# Patient Record
Sex: Female | Born: 1940 | Race: White | Hispanic: No | Smoking: Former smoker
Health system: Southern US, Community
[De-identification: ages and names within clinical notes are randomized; demographics above are authoritative.]

## PROBLEM LIST (undated history)

## (undated) DIAGNOSIS — I499 Cardiac arrhythmia, unspecified: Secondary | ICD-10-CM

## (undated) DIAGNOSIS — J189 Pneumonia, unspecified organism: Secondary | ICD-10-CM

## (undated) DIAGNOSIS — I4819 Other persistent atrial fibrillation: Secondary | ICD-10-CM

## (undated) DIAGNOSIS — J069 Acute upper respiratory infection, unspecified: Secondary | ICD-10-CM

## (undated) DIAGNOSIS — E785 Hyperlipidemia, unspecified: Secondary | ICD-10-CM

## (undated) DIAGNOSIS — T783XXA Angioneurotic edema, initial encounter: Secondary | ICD-10-CM

## (undated) DIAGNOSIS — E079 Disorder of thyroid, unspecified: Secondary | ICD-10-CM

## (undated) DIAGNOSIS — T7840XA Allergy, unspecified, initial encounter: Secondary | ICD-10-CM

## (undated) DIAGNOSIS — I10 Essential (primary) hypertension: Secondary | ICD-10-CM

## (undated) DIAGNOSIS — E669 Obesity, unspecified: Secondary | ICD-10-CM

## (undated) DIAGNOSIS — E039 Hypothyroidism, unspecified: Secondary | ICD-10-CM

## (undated) DIAGNOSIS — IMO0002 Reserved for concepts with insufficient information to code with codable children: Secondary | ICD-10-CM

## (undated) DIAGNOSIS — F419 Anxiety disorder, unspecified: Secondary | ICD-10-CM

## (undated) HISTORY — DX: Reserved for concepts with insufficient information to code with codable children: IMO0002

## (undated) HISTORY — PX: ADENOIDECTOMY: SUR15

## (undated) HISTORY — DX: Allergy, unspecified, initial encounter: T78.40XA

## (undated) HISTORY — DX: Angioneurotic edema, initial encounter: T78.3XXA

## (undated) HISTORY — DX: Disorder of thyroid, unspecified: E07.9

## (undated) HISTORY — DX: Anxiety disorder, unspecified: F41.9

## (undated) HISTORY — DX: Essential (primary) hypertension: I10

## (undated) HISTORY — DX: Cardiac arrhythmia, unspecified: I49.9

## (undated) HISTORY — DX: Obesity, unspecified: E66.9

## (undated) HISTORY — PX: TONSILLECTOMY: SUR1361

## (undated) HISTORY — PX: TOTAL ABDOMINAL HYSTERECTOMY: SHX209

## (undated) HISTORY — DX: Acute upper respiratory infection, unspecified: J06.9

## (undated) HISTORY — DX: Hyperlipidemia, unspecified: E78.5

---

## 2001-06-01 ENCOUNTER — Encounter: Payer: Self-pay | Admitting: Internal Medicine

## 2001-06-01 ENCOUNTER — Ambulatory Visit (HOSPITAL_COMMUNITY): Admission: RE | Admit: 2001-06-01 | Discharge: 2001-06-01 | Payer: Self-pay | Admitting: Internal Medicine

## 2001-11-19 ENCOUNTER — Encounter: Admission: RE | Admit: 2001-11-19 | Discharge: 2001-11-19 | Payer: Self-pay | Admitting: Family Medicine

## 2001-11-19 ENCOUNTER — Encounter: Payer: Self-pay | Admitting: Family Medicine

## 2001-12-29 ENCOUNTER — Encounter: Payer: Self-pay | Admitting: Family Medicine

## 2001-12-29 ENCOUNTER — Encounter: Admission: RE | Admit: 2001-12-29 | Discharge: 2001-12-29 | Payer: Self-pay | Admitting: Family Medicine

## 2002-01-19 ENCOUNTER — Encounter: Admission: RE | Admit: 2002-01-19 | Discharge: 2002-03-05 | Payer: Self-pay | Admitting: Neurosurgery

## 2002-06-06 ENCOUNTER — Encounter: Payer: Self-pay | Admitting: Family Medicine

## 2002-06-06 ENCOUNTER — Ambulatory Visit (HOSPITAL_COMMUNITY): Admission: RE | Admit: 2002-06-06 | Discharge: 2002-06-06 | Payer: Self-pay | Admitting: Family Medicine

## 2002-06-21 ENCOUNTER — Ambulatory Visit (HOSPITAL_COMMUNITY): Admission: RE | Admit: 2002-06-21 | Discharge: 2002-06-21 | Payer: Self-pay | Admitting: Family Medicine

## 2002-06-21 ENCOUNTER — Encounter: Payer: Self-pay | Admitting: Family Medicine

## 2002-06-25 ENCOUNTER — Encounter: Admission: RE | Admit: 2002-06-25 | Discharge: 2002-06-25 | Payer: Self-pay | Admitting: Family Medicine

## 2002-06-25 ENCOUNTER — Encounter: Payer: Self-pay | Admitting: Family Medicine

## 2002-12-19 ENCOUNTER — Encounter: Payer: Self-pay | Admitting: Family Medicine

## 2002-12-19 ENCOUNTER — Encounter: Admission: RE | Admit: 2002-12-19 | Discharge: 2002-12-19 | Payer: Self-pay | Admitting: Family Medicine

## 2003-07-10 ENCOUNTER — Encounter: Admission: RE | Admit: 2003-07-10 | Discharge: 2003-07-10 | Payer: Self-pay | Admitting: Family Medicine

## 2003-07-10 ENCOUNTER — Encounter: Payer: Self-pay | Admitting: Family Medicine

## 2004-01-06 HISTORY — PX: NM MYOCAR MULTIPLE W/SPECT: HXRAD626

## 2004-01-06 HISTORY — PX: TRANSTHORACIC ECHOCARDIOGRAM: SHX275

## 2004-08-14 ENCOUNTER — Encounter: Admission: RE | Admit: 2004-08-14 | Discharge: 2004-08-14 | Payer: Self-pay | Admitting: Family Medicine

## 2004-08-31 ENCOUNTER — Encounter: Admission: RE | Admit: 2004-08-31 | Discharge: 2004-08-31 | Payer: Self-pay | Admitting: Family Medicine

## 2004-10-12 ENCOUNTER — Ambulatory Visit (HOSPITAL_COMMUNITY): Admission: RE | Admit: 2004-10-12 | Discharge: 2004-10-12 | Payer: Self-pay | Admitting: Family Medicine

## 2005-09-08 ENCOUNTER — Ambulatory Visit (HOSPITAL_COMMUNITY): Admission: RE | Admit: 2005-09-08 | Discharge: 2005-09-08 | Payer: Self-pay | Admitting: Family Medicine

## 2005-09-28 ENCOUNTER — Encounter: Admission: RE | Admit: 2005-09-28 | Discharge: 2005-09-28 | Payer: Self-pay | Admitting: Family Medicine

## 2005-10-11 ENCOUNTER — Ambulatory Visit (HOSPITAL_COMMUNITY): Admission: RE | Admit: 2005-10-11 | Discharge: 2005-10-11 | Payer: Self-pay | Admitting: General Surgery

## 2006-10-05 ENCOUNTER — Encounter: Admission: RE | Admit: 2006-10-05 | Discharge: 2006-10-05 | Payer: Self-pay | Admitting: Family Medicine

## 2007-10-31 ENCOUNTER — Ambulatory Visit (HOSPITAL_COMMUNITY): Admission: RE | Admit: 2007-10-31 | Discharge: 2007-10-31 | Payer: Self-pay | Admitting: Family Medicine

## 2007-11-10 ENCOUNTER — Encounter: Admission: RE | Admit: 2007-11-10 | Discharge: 2007-11-10 | Payer: Self-pay | Admitting: Family Medicine

## 2008-02-06 ENCOUNTER — Ambulatory Visit (HOSPITAL_COMMUNITY): Admission: RE | Admit: 2008-02-06 | Discharge: 2008-02-06 | Payer: Self-pay | Admitting: Ophthalmology

## 2008-02-20 ENCOUNTER — Ambulatory Visit (HOSPITAL_COMMUNITY): Admission: RE | Admit: 2008-02-20 | Discharge: 2008-02-20 | Payer: Self-pay | Admitting: Family Medicine

## 2008-02-29 ENCOUNTER — Ambulatory Visit (HOSPITAL_COMMUNITY): Admission: RE | Admit: 2008-02-29 | Discharge: 2008-02-29 | Payer: Self-pay | Admitting: Ophthalmology

## 2008-03-21 ENCOUNTER — Ambulatory Visit (HOSPITAL_COMMUNITY): Admission: RE | Admit: 2008-03-21 | Discharge: 2008-03-21 | Payer: Self-pay | Admitting: Ophthalmology

## 2008-12-04 ENCOUNTER — Encounter: Admission: RE | Admit: 2008-12-04 | Discharge: 2008-12-04 | Payer: Self-pay | Admitting: Family Medicine

## 2008-12-19 ENCOUNTER — Ambulatory Visit: Payer: Self-pay | Admitting: Family Medicine

## 2008-12-19 ENCOUNTER — Encounter: Admission: RE | Admit: 2008-12-19 | Discharge: 2008-12-19 | Payer: Self-pay | Admitting: Family Medicine

## 2008-12-19 DIAGNOSIS — I10 Essential (primary) hypertension: Secondary | ICD-10-CM | POA: Insufficient documentation

## 2008-12-19 DIAGNOSIS — M25519 Pain in unspecified shoulder: Secondary | ICD-10-CM

## 2008-12-28 ENCOUNTER — Encounter: Admission: RE | Admit: 2008-12-28 | Discharge: 2008-12-28 | Payer: Self-pay | Admitting: Family Medicine

## 2008-12-31 ENCOUNTER — Ambulatory Visit: Payer: Self-pay | Admitting: Family Medicine

## 2008-12-31 DIAGNOSIS — M719 Bursopathy, unspecified: Secondary | ICD-10-CM

## 2008-12-31 DIAGNOSIS — M67919 Unspecified disorder of synovium and tendon, unspecified shoulder: Secondary | ICD-10-CM | POA: Insufficient documentation

## 2009-01-07 ENCOUNTER — Telehealth: Payer: Self-pay | Admitting: Family Medicine

## 2009-07-29 ENCOUNTER — Encounter: Payer: Self-pay | Admitting: Family Medicine

## 2009-08-21 ENCOUNTER — Ambulatory Visit: Payer: Self-pay | Admitting: Family Medicine

## 2009-08-21 DIAGNOSIS — E669 Obesity, unspecified: Secondary | ICD-10-CM

## 2009-08-21 DIAGNOSIS — E039 Hypothyroidism, unspecified: Secondary | ICD-10-CM

## 2009-08-22 LAB — CONVERTED CEMR LAB
ALT: 55 units/L — ABNORMAL HIGH (ref 0–35)
AST: 33 units/L (ref 0–37)
Albumin: 4.7 g/dL (ref 3.5–5.2)
Alkaline Phosphatase: 107 units/L (ref 39–117)
BUN: 23 mg/dL (ref 6–23)
CO2: 21 meq/L (ref 19–32)
Calcium: 10 mg/dL (ref 8.4–10.5)
Chloride: 106 meq/L (ref 96–112)
Cholesterol: 338 mg/dL — ABNORMAL HIGH (ref 0–200)
Creatinine, Ser: 1.03 mg/dL (ref 0.40–1.20)
Glucose, Bld: 93 mg/dL (ref 70–99)
HDL: 53 mg/dL (ref >39)
Potassium: 5 meq/L (ref 3.5–5.3)
Sodium: 144 meq/L (ref 135–145)
TSH: 5.195 microintl units/mL — ABNORMAL HIGH (ref 0.350–4.500)
Total Bilirubin: 0.4 mg/dL (ref 0.3–1.2)
Total CHOL/HDL Ratio: 6.4
Total Protein: 7.4 g/dL (ref 6.0–8.3)
Triglycerides: 414 mg/dL — ABNORMAL HIGH (ref <150)

## 2009-12-15 ENCOUNTER — Encounter: Admission: RE | Admit: 2009-12-15 | Discharge: 2009-12-15 | Payer: Self-pay | Admitting: Family Medicine

## 2010-03-18 ENCOUNTER — Telehealth: Payer: Self-pay | Admitting: Family Medicine

## 2010-03-18 DIAGNOSIS — E785 Hyperlipidemia, unspecified: Secondary | ICD-10-CM

## 2010-03-19 ENCOUNTER — Encounter: Payer: Self-pay | Admitting: Family Medicine

## 2010-03-20 ENCOUNTER — Encounter: Payer: Self-pay | Admitting: Family Medicine

## 2010-03-23 ENCOUNTER — Ambulatory Visit: Payer: Self-pay | Admitting: Family Medicine

## 2010-03-23 DIAGNOSIS — F4322 Adjustment disorder with anxiety: Secondary | ICD-10-CM

## 2010-03-23 LAB — CONVERTED CEMR LAB
ALT: 55 units/L — ABNORMAL HIGH (ref 0–35)
AST: 53 units/L — ABNORMAL HIGH (ref 0–37)
Albumin: 4.4 g/dL (ref 3.5–5.2)
Alkaline Phosphatase: 113 units/L (ref 39–117)
BUN: 20 mg/dL (ref 6–23)
CO2: 23 meq/L (ref 19–32)
Calcium: 9.2 mg/dL (ref 8.4–10.5)
Chloride: 104 meq/L (ref 96–112)
Cholesterol: 286 mg/dL — ABNORMAL HIGH (ref 0–200)
Creatinine, Ser: 0.89 mg/dL (ref 0.40–1.20)
Direct LDL: 181 mg/dL — ABNORMAL HIGH
Glucose, Bld: 93 mg/dL (ref 70–99)
HDL: 43 mg/dL (ref >39)
Potassium: 4.6 meq/L (ref 3.5–5.3)
Sodium: 140 meq/L (ref 135–145)
TSH: 0.203 microintl units/mL — ABNORMAL LOW (ref 0.350–4.500)
Total Bilirubin: 0.4 mg/dL (ref 0.3–1.2)
Total CHOL/HDL Ratio: 6.7
Total Protein: 6.9 g/dL (ref 6.0–8.3)
Triglycerides: 401 mg/dL — ABNORMAL HIGH (ref <150)

## 2010-05-04 ENCOUNTER — Ambulatory Visit: Payer: Self-pay | Admitting: Family Medicine

## 2010-05-05 LAB — CONVERTED CEMR LAB: TSH: 0.922 microintl units/mL (ref 0.350–4.500)

## 2010-06-24 ENCOUNTER — Ambulatory Visit: Payer: Self-pay | Admitting: Family Medicine

## 2010-06-24 DIAGNOSIS — M1990 Unspecified osteoarthritis, unspecified site: Secondary | ICD-10-CM | POA: Insufficient documentation

## 2010-06-25 ENCOUNTER — Encounter: Payer: Self-pay | Admitting: Family Medicine

## 2010-06-26 LAB — CONVERTED CEMR LAB
ALT: 22 units/L (ref 0–35)
AST: 28 units/L (ref 0–37)
Cholesterol: 273 mg/dL — ABNORMAL HIGH (ref 0–200)
HDL: 56 mg/dL (ref >39)
LDL Cholesterol: 169 mg/dL — ABNORMAL HIGH (ref 0–99)
Total CHOL/HDL Ratio: 4.9
Triglycerides: 239 mg/dL — ABNORMAL HIGH (ref <150)
VLDL: 48 mg/dL — ABNORMAL HIGH (ref 0–40)

## 2010-08-06 ENCOUNTER — Ambulatory Visit: Payer: Self-pay | Admitting: Family Medicine

## 2010-09-15 ENCOUNTER — Telehealth (INDEPENDENT_AMBULATORY_CARE_PROVIDER_SITE_OTHER): Payer: Self-pay | Admitting: *Deleted

## 2010-09-24 ENCOUNTER — Telehealth: Payer: Self-pay | Admitting: Family Medicine

## 2010-10-23 ENCOUNTER — Encounter: Payer: Self-pay | Admitting: Family Medicine

## 2010-10-23 ENCOUNTER — Ambulatory Visit: Payer: Self-pay | Admitting: Family Medicine

## 2010-10-26 LAB — CONVERTED CEMR LAB: TSH: 1.014 microintl units/mL (ref 0.350–4.500)

## 2010-12-05 ENCOUNTER — Other Ambulatory Visit: Payer: Self-pay | Admitting: Family Medicine

## 2010-12-05 DIAGNOSIS — Z0000 Encounter for general adult medical examination without abnormal findings: Secondary | ICD-10-CM

## 2010-12-15 NOTE — Progress Notes (Signed)
Summary: Lab order  Phone Note Call from Patient   Caller: [phone] Summary of Call: Pt is overdue for labs. She states she is doing well and does not need an OV. Pt would like to know if you can order labs and refill meds w/out her having an OV. Please advise. Initial call taken by: Payton Spark CMA,  Mar 18, 2010 11:34 AM  Follow-up for Phone Call        She is due for FLP/ LFTs/ TSH and she never did come in for a nurse BP check when I asked her to -- her BP was HIGH last visit.  She can update her labs, but have her schedule an OV with me in the next 2 wks-- I see my hypertensive, high cholesterol, thyroid patients every 6 months.   Follow-up by: Seymour Bars DO,  Mar 18, 2010 11:43 AM     Appended Document: Lab order   Appended Document: Lab order Pt aware of lab order- will go tomorrow. Pt scheduled for F/U on Monday
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
## 2010-12-15 NOTE — Progress Notes (Signed)
Summary: refill  Phone Note Refill Request Message from:  Patient on September 15, 2010 10:36 AM  Refills Requested: Medication #1:  ALPRAZOLAM 0.5 MG TABS Take 1 tablet by mouth once a day   Supply Requested: 1 [facility] [place]   Method Requested: Fax to Local Pharmacy Initial call taken by: Kathlene November LPN,  September 15, 2010 10:36 AM  Follow-up for Phone Call        Too early. Due 09/22/10 Follow-up by: Payton Spark CMA,  September 15, 2010 11:20 AM
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
## 2010-12-15 NOTE — Assessment & Plan Note (Signed)
Summary: BP check/ TSH order  Nurse Visit   Vital Signs:  Patient profile:   70 year old female Pulse rate:   66 / minute BP sitting:   158 / 87  (left arm) Cuff size:   large  Vitals Entered By: Kathlene November (May 04, 2010 10:48 AM) CC: BP check- pt states home readings have been in 130-80's since starting new med- thinks it is up today because she is in alot of pain due to her arthritis CC: Follow-up HTN HPI: Taking Meds?yes Side Effects?none Chest Pain, SOB, Dizziness?yes A/P: HTN(401.1) At Goal? If no, MD will be notified. Follow-up in--  5 minutes was spent with the pt.   Allergies: 1)  ! Pcn 2)  ! * Statins  Orders Added: 1)  Est. Patient Level I [[address]] 2)  T-TSH [[id number]]

## 2010-12-15 NOTE — Assessment & Plan Note (Signed)
Summary: FLU SHOT  Nurse Visit   Vitals Entered By: Payton Spark CMA (August 06, 2010 11:01 AM)  Allergies: 1)  ! Pcn 2)  ! * Statins  Orders Added: 1)  Flu Vaccine 95yrs + MEDICARE PATIENTS [Q2039] 2)  Administration Flu vaccine - MCR [G0008] Flu Vaccine Consent Questions     Do you have a history of severe allergic reactions to this vaccine? no    Any prior history of allergic reactions to egg and/or gelatin? no    Do you have a sensitivity to the preservative Thimersol? no    Do you have a past history of Guillan-Barre Syndrome? no    Do you currently have an acute febrile illness? no    Have you ever had a severe reaction to latex? no    Vaccine information given and explained to patient? yes    Are you currently pregnant? no    Lot Number:[id number]   Exp Date:05/15/2011   Site Given  Left Deltoid IMmedflu

## 2010-12-15 NOTE — Progress Notes (Signed)
Summary: refill  Phone Note Refill Request Message from:  Patient on September 24, 2010 9:25 AM  Refills Requested: Medication #1:  PREMARIN 0.625 MG TABS take one by mouth three times per week   Supply Requested: 1 month [facility] [place]   Method Requested: Fax to Local Pharmacy Initial call taken by: Kathlene November LPN,  September 24, 2010 9:26 AM    Prescriptions: PREMARIN 0.625 MG TABS (ESTROGENS CONJUGATED) take one by mouth three times per week  #36 x 2   Entered and Authorized by:   Nani Gasser MD   Signed by:   Nani Gasser MD on 09/24/2010   Method used:   Electronically to        [facility] [phone]* (retail)       [address] [place], [place]  [address]       Ph: [phone] or [phone]       Fax: [phone]   RxID:   [id number]
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
## 2010-12-15 NOTE — Miscellaneous (Signed)
Summary: [facility]  [facility]   Imported By: Lanelle Bal 12/22/2009 10:50:33  _____________________________________________________________________  External Attachment:    Type:   Image     Comment:   External Document

## 2010-12-22 ENCOUNTER — Telehealth (INDEPENDENT_AMBULATORY_CARE_PROVIDER_SITE_OTHER): Payer: Self-pay | Admitting: *Deleted

## 2010-12-22 ENCOUNTER — Ambulatory Visit
Admission: RE | Admit: 2010-12-22 | Discharge: 2010-12-22 | Disposition: A | Discharge status: Home / Self Care | Source: Ambulatory Visit | Attending: Family Medicine | Admitting: Family Medicine

## 2010-12-22 DIAGNOSIS — Z0000 Encounter for general adult medical examination without abnormal findings: Secondary | ICD-10-CM

## 2010-12-23 LAB — HM MAMMOGRAPHY

## 2010-12-31 NOTE — Progress Notes (Signed)
Summary: Alproazolam refill  Phone Note Refill Request Call back at [phone] Message from:  Patient on December 22, 2010 10:38 AM  Refills Requested: Medication #1:  ALPRAZOLAM 0.5 MG TABS Take 1 tablet by mouth once a day   Dosage confirmed as above?Dosage Confirmed   Brand Name Necessary? No   Supply Requested: 1 month  Method Requested: Electronic Initial call taken by: Lannette Donath,  December 22, 2010 10:38 AM    Prescriptions: ALPRAZOLAM 0.5 MG TABS (ALPRAZOLAM) Take 1 tablet by mouth once a day  #90 x 0   Entered by:   Payton Spark CMA   Authorized by:   Seymour Bars DO   Signed by:   Payton Spark CMA on 12/22/2010   Method used:   Printed then faxed to ...       [facility]  [place] [phone]* (retail)       [address] [place], [place]  [address]       Ph: [phone] or [phone]       Fax: [phone]   RxID:   [id number]

## 2011-01-05 ENCOUNTER — Encounter: Payer: Self-pay | Admitting: Family Medicine

## 2011-02-17 ENCOUNTER — Other Ambulatory Visit: Payer: Self-pay | Admitting: *Deleted

## 2011-02-17 MED ORDER — LANSOPRAZOLE 30 MG PO CPDR: 30.0000 mg | DELAYED_RELEASE_CAPSULE | Freq: Every day | ORAL | Status: DC

## 2011-02-17 NOTE — Telephone Encounter (Signed)
Pt requested 90 day refill for Lansoprazole.  Pt was last seen in December 2012.  Refill sent.

## 2011-03-01 ENCOUNTER — Ambulatory Visit: Payer: Self-pay | Admitting: Family Medicine

## 2011-03-24 ENCOUNTER — Other Ambulatory Visit: Payer: Self-pay | Admitting: Family Medicine

## 2011-03-24 ENCOUNTER — Other Ambulatory Visit: Payer: Self-pay | Admitting: *Deleted

## 2011-03-24 MED ORDER — ALPRAZOLAM 0.5 MG PO TABS: 0.5000 mg | ORAL_TABLET | Freq: Every day | ORAL | Status: DC

## 2011-03-24 NOTE — Telephone Encounter (Signed)
Pt called and said she was suppose to have a refill of her xanex sent to her pharmacy [facility])   Said her pharm has faxed and we have not responded.  Totally out of medication. Plan:  Spoke with Payton Spark,, CMA and she [facility]ed the script [facility] right before lunch. Pt informed to give a little bit of time and go and pick it up. Jarvis Newcomer, LPN [facility]

## 2011-04-02 NOTE — H&P (Signed)
NAMEGLENDORA, CLOUATRE                ACCOUNT NO.:  [id number]   MEDICAL RECORD NO.:  [id number]          PATIENT TYPE:  AMB   LOCATION:                                FACILITY:  [facility]   PHYSICIAN:  Dalia Heading, M.D.  DATE OF BIRTH:  30-May-1941   DATE OF ADMISSION:  DATE OF DISCHARGE:  LH                                HISTORY & PHYSICAL   CHIEF COMPLAINT:  Need for screening colonoscopy.   HISTORY OF PRESENT ILLNESS:  The patient is a 70 year old white female who  was referred for endoscopic evaluation.  She needs a colonoscopy for  screening purposes.  No abdominal pain, weight loss, nausea, vomiting,  diarrhea, constipation, melena, hematochezia have been noted.  She has never  had a colonoscopy. There is no family history of colon carcinoma.   PAST MEDICAL HISTORY:  1.  Hypothyroidism.  2.  Hypertension.  3.  High cholesterol levels.   PAST SURGICAL HISTORY:  Hysterectomy.   CURRENT MEDICATIONS:  Voltaren, Prevacid, Synthroid, Zocor, Lotrel,  Premarin.   ALLERGIES:  PENICILLIN.   REVIEW OF SYSTEMS:  Noncontributory.   PHYSICAL EXAMINATION:  GENERAL:  The patient is a well-developed, well-  nourished white female in no acute distress.  LUNGS:  Clear to auscultation with equal breath sounds bilaterally.  HEART:  Regular rate and rhythm without S3, S4, or murmurs.  ABDOMEN:  Soft, nontender, nondistended.  No hepatosplenomegaly or masses  are noted.  RECTAL:  Deferred to the procedure.   IMPRESSION:  Need for screening colonoscopy.   PLAN:  The patient is scheduled for colonoscopy on October 11, 2005.  The  risks and benefits of the procedure including bleeding and perforation were  fully explained to the patient who gave informed consent.      Dalia Heading, M.D.  Electronically Signed     MAJ/MEDQ  D:  09/16/2005  T:  09/16/2005  Job:  [id number]   cc:   [facility]  Fax: [phone]   Patrica Duel, M.D.  Fax: [phone]

## 2011-04-26 ENCOUNTER — Other Ambulatory Visit: Payer: Self-pay | Admitting: *Deleted

## 2011-04-26 MED ORDER — DICLOFENAC SODIUM 75 MG PO TBEC: 75.0000 mg | DELAYED_RELEASE_TABLET | Freq: Two times a day (BID) | ORAL | Status: DC | PRN

## 2011-05-20 ENCOUNTER — Ambulatory Visit (INDEPENDENT_AMBULATORY_CARE_PROVIDER_SITE_OTHER): Admitting: Family Medicine

## 2011-05-20 ENCOUNTER — Encounter: Payer: Self-pay | Admitting: Family Medicine

## 2011-05-20 DIAGNOSIS — F3289 Other specified depressive episodes: Secondary | ICD-10-CM | POA: Insufficient documentation

## 2011-05-20 DIAGNOSIS — J019 Acute sinusitis, unspecified: Secondary | ICD-10-CM | POA: Insufficient documentation

## 2011-05-20 DIAGNOSIS — F329 Major depressive disorder, single episode, unspecified: Secondary | ICD-10-CM

## 2011-05-20 MED ORDER — CITALOPRAM HYDROBROMIDE 20 MG PO TABS: 20.0000 mg | ORAL_TABLET | Freq: Every day | ORAL | Status: DC

## 2011-05-20 MED ORDER — AZITHROMYCIN 250 MG PO TABS: ORAL_TABLET | ORAL | Status: AC

## 2011-05-20 NOTE — Assessment & Plan Note (Signed)
Pt admits to being depressed and is agreeable to treatment.  Will add Citalopram (after she finishes out her zithromax), starting at 10 mg/ day for the first wk then going up to 20 mg/ day.  Call if any problems including worsening mood.  Can use her benzos prn but hopefully by her 6 wk f/u, we can start cutting back on these.

## 2011-05-20 NOTE — Progress Notes (Signed)
  Subjective:    Patient ID: Nicole Norton, female    DOB: [birth year], 70 y.o.   MRN: [id number]  HPI 70 yo WF presents for 2 wks of allergic postnasal drip, ear pressure, sneezing and sore throat.  She is getting more frequent HAs.  She takes Zyrtec daily but has added [employer] which is why her BP is running higher.  She has had some loose stools, lack of appetite and more fatigue the past 2 days.  She admits to needing an anti depressant.  Her husband is with her today and she admits to leaning on her xanax too much, easily crying and having multiple stressors.  She denies any thoughts of harming herself or others and has a good support system.  BP 149/88  Pulse 79  Temp(Src) 98.3 F (36.8 C) (Oral)  Ht 5' 6.5" (1.689 m)  Wt 224 lb (101.606 kg)  BMI 35.61 kg/m2  SpO2 95%   Review of Systems  Constitutional: Negative for fever and chills.  HENT: Positive for ear pain, congestion, sore throat, postnasal drip and sinus pressure. Negative for rhinorrhea.   Respiratory: Negative for cough, shortness of breath and wheezing.   Neurological: Positive for headaches.  Psychiatric/Behavioral: Positive for sleep disturbance and dysphoric mood. Negative for suicidal ideas. The patient is nervous/anxious.        Objective:   Physical Exam  Constitutional: She appears well-developed and well-nourished. No distress.       obese  HENT:  Head: Normocephalic and atraumatic.  Right Ear: External ear normal.  Left Ear: External ear normal.  Nose: Nose normal.  Mouth/Throat: Oropharynx is clear and moist.       Boggy turbinates, slight cobblestoning.  Maxillary sinuses TTP  Eyes: Conjunctivae are normal.  Neck: Neck supple.  Cardiovascular: Normal rate, regular rhythm and normal heart sounds.   Pulmonary/Chest: Effort normal and breath sounds normal. She has no wheezes.  Lymphadenopathy:    She has cervical adenopathy.  Skin: Skin is warm and dry.  Psychiatric: She has a normal mood  and affect.       Slightly tearful          Assessment & Plan:

## 2011-05-20 NOTE — Patient Instructions (Signed)
Take 5 days of Zithromax for sinus infection. Stay on Zyrtec daily, adding Nasonex 2 sprays/ nostril daily and use nasal saline morning and night.  For mood, once you are done with Zithromax, start on Celexa -- 1/2 tab at bedtime  For the first wk, then go up to a full tab.  Call me if any problems.  Return for f/u mood in 6 wks.

## 2011-05-20 NOTE — Assessment & Plan Note (Signed)
Secondary to underlying allergies. Will treat with zithromax x 5 days.  She is to stay on Zyrtec, nasal saline and added Nasonex sample today.  Avoid otc decongestants with her HBP.

## 2011-06-22 ENCOUNTER — Other Ambulatory Visit: Payer: Self-pay | Admitting: Family Medicine

## 2011-06-28 ENCOUNTER — Other Ambulatory Visit: Payer: Self-pay | Admitting: Family Medicine

## 2011-06-30 ENCOUNTER — Encounter: Payer: Self-pay | Admitting: Family Medicine

## 2011-07-06 ENCOUNTER — Ambulatory Visit (INDEPENDENT_AMBULATORY_CARE_PROVIDER_SITE_OTHER): Admitting: Family Medicine

## 2011-07-06 ENCOUNTER — Encounter: Payer: Self-pay | Admitting: Family Medicine

## 2011-07-06 VITALS — BP 141/90 | HR 79 | Ht 67.0 in | Wt 222.0 lb

## 2011-07-06 DIAGNOSIS — F329 Major depressive disorder, single episode, unspecified: Secondary | ICD-10-CM

## 2011-07-06 DIAGNOSIS — F341 Dysthymic disorder: Secondary | ICD-10-CM

## 2011-07-06 NOTE — Progress Notes (Signed)
  Subjective:    Patient ID: Nicole Norton, female    DOB: 10/07/1941, 70 y.o.   MRN: [id number]  HPI  70 yo WF presents for f/u depression and anxiety.  I started her on Celexa 6 wks ago.  She felt like she was in a fog, had heartburn or chestpains.  She took it for 3.5 wks and stopped it because of the side effects.  She does feel like it did help her sleep and irritability.  She feels like she has realized what she was doing wrong - being a people pleaser and overdoing it.  She declined the need for medication or behavioral therapy.  She is sleeping better, using Xanax once daily which is helping more.    BP 141/90  Pulse 79  Ht 5\' 7"  (1.702 m)  Wt 222 lb (100.699 kg)  BMI 34.77 kg/m2  SpO2 98%    Review of Systems  Constitutional: Negative for fatigue and unexpected weight change.  Respiratory: Negative for shortness of breath.   Cardiovascular: Negative for chest pain and palpitations.  Psychiatric/Behavioral: Negative for sleep disturbance and dysphoric mood. The patient is not nervous/anxious.        Objective:   Physical Exam  Constitutional: She appears well-developed and well-nourished. No distress.       obese  Psychiatric: She has a normal mood and affect. Her behavior is normal.          Assessment & Plan:  Depression and Anxiety:  GAD 7 score: 5.  PHQ 9 score of 3.  Working on stress reduction.   Had SEs from Celexa at 3 wks so stopped it.  She is opposed to trying anything else at this point and her mood has improved some.  She agrees to work on stress reduction and will call if she wants to add conseling.  Not overusing her benzos.

## 2011-08-09 LAB — BASIC METABOLIC PANEL
Chloride: 108
Creatinine, Ser: 0.85
GFR calc Af Amer: >60
GFR calc non Af Amer: >60
Potassium: 4.6

## 2011-08-09 LAB — HEMOGLOBIN AND HEMATOCRIT, BLOOD
HCT: 35.8 — ABNORMAL LOW
Hemoglobin: 12.5

## 2011-09-13 ENCOUNTER — Other Ambulatory Visit: Payer: Self-pay | Admitting: *Deleted

## 2011-09-13 MED ORDER — ALPRAZOLAM 0.5 MG PO TABS: 0.5000 mg | ORAL_TABLET | Freq: Every day | ORAL | Status: DC

## 2011-09-15 ENCOUNTER — Other Ambulatory Visit: Payer: Self-pay | Admitting: *Deleted

## 2011-09-15 MED ORDER — DICLOFENAC SODIUM 75 MG PO TBEC: 75.0000 mg | DELAYED_RELEASE_TABLET | Freq: Two times a day (BID) | ORAL | Status: DC | PRN

## 2011-11-11 ENCOUNTER — Other Ambulatory Visit: Payer: Self-pay | Admitting: Family Medicine

## 2011-12-15 ENCOUNTER — Other Ambulatory Visit: Payer: Self-pay | Admitting: Family Medicine

## 2011-12-15 DIAGNOSIS — Z1231 Encounter for screening mammogram for malignant neoplasm of breast: Secondary | ICD-10-CM

## 2011-12-23 ENCOUNTER — Encounter: Payer: Self-pay | Admitting: Family Medicine

## 2011-12-23 ENCOUNTER — Ambulatory Visit (INDEPENDENT_AMBULATORY_CARE_PROVIDER_SITE_OTHER): Admitting: Family Medicine

## 2011-12-23 DIAGNOSIS — J40 Bronchitis, not specified as acute or chronic: Secondary | ICD-10-CM | POA: Diagnosis not present

## 2011-12-23 DIAGNOSIS — J329 Chronic sinusitis, unspecified: Secondary | ICD-10-CM

## 2011-12-23 DIAGNOSIS — I10 Essential (primary) hypertension: Secondary | ICD-10-CM

## 2011-12-23 DIAGNOSIS — Z2820 Immunization not carried out because of patient decision for unspecified reason: Secondary | ICD-10-CM

## 2011-12-23 DIAGNOSIS — J0100 Acute maxillary sinusitis, unspecified: Secondary | ICD-10-CM

## 2011-12-23 MED ORDER — HYDROCOD POLST-CHLORPHEN POLST 10-8 MG/5ML PO LQCR: 5.0000 mL | Freq: Two times a day (BID) | ORAL | Status: DC | PRN

## 2011-12-23 MED ORDER — AZITHROMYCIN 250 MG PO TABS: 500.0000 mg | ORAL_TABLET | Freq: Once | ORAL | Status: AC

## 2011-12-23 NOTE — Patient Instructions (Signed)
Pneumococcal Vaccine, Polyvalent suspension for injection What is this medicine? PNEUMOCOCCAL VACCINE, POLYVALENT (NEU mo KOK al vak SEEN, pol ee VEY luhnt) is a vaccine to prevent pneumococcus bacteria infection. These bacteria are a major cause of ear infections, 'Strep throat' infections, and serious pneumonia, meningitis, or blood infections worldwide. These vaccines help the body to produce antibodies (protective substances) that help your body defend against these bacteria. This vaccine is recommended for infants and young children. This vaccine will not treat an infection. This medicine may be used for other purposes; ask your health care provider or pharmacist if you have questions. What should I tell my health care provider before I take this medicine? They need to know if you have any of these conditions: -bleeding problems -fever -immune system problems -low platelet count in the blood -seizures -an unusual or allergic reaction to pneumococcal vaccine, diphtheria toxoid, other vaccines, latex, other medicines, foods, dyes, or preservatives -pregnant or trying to get pregnant -breast-feeding How should I use this medicine? This vaccine is for injection into a muscle. It is given by a health care professional. A copy of Vaccine Information Statements will be given before each vaccination. Read this sheet carefully each time. The sheet may change frequently. Talk to your pediatrician regarding the use of this medicine in children. While this drug may be prescribed for children as young as 6 weeks old for selected conditions, precautions do apply. Overdosage: If you think you have taken too much of this medicine contact a poison control center or emergency room at once. NOTE: This medicine is only for you. Do not share this medicine with others. What if I miss a dose? It is important not to miss your dose. Call your doctor or health care professional if you are unable to keep an  appointment. What may interact with this medicine? -medicines for cancer chemotherapy -medicines that suppress your immune function -medicines that treat or prevent blood clots like warfarin, enoxaparin, and dalteparin -steroid medicines like prednisone or cortisone This list may not describe all possible interactions. Give your health care provider a list of all the medicines, herbs, non-prescription drugs, or dietary supplements you use. Also tell them if you smoke, drink alcohol, or use illegal drugs. Some items may interact with your medicine. What should I watch for while using this medicine? Mild fever and pain should go away in 3 days or less. Report any unusual symptoms to your doctor or health care professional. What side effects may I notice from receiving this medicine? Side effects that you should report to your doctor or health care professional as soon as possible: -allergic reactions like skin rash, itching or hives, swelling of the face, lips, or tongue -breathing problems -confused -fever over 102 degrees F -pain, tingling, numbness in the hands or feet -seizures -unusual bleeding or bruising -unusual muscle weakness Side effects that usually do not require medical attention (report to your doctor or health care professional if they continue or are bothersome): -aches and pains -diarrhea -fever of 102 degrees F or less -headache -irritable -loss of appetite -pain, tender at site where injected -trouble sleeping This list may not describe all possible side effects. Call your doctor for medical advice about side effects. You may report side effects to FDA at 1-800-FDA-1088. Where should I keep my medicine? This does not apply. This vaccine is given in a clinic, pharmacy, doctor's office, or other health care setting and will not be stored at home. NOTE: This sheet is a summary.   It may not cover all possible information. If you have questions about this medicine, talk to  your doctor, pharmacist, or health care provider.  2012, [employer]vier/Gold Standard. (01/14/2009 10:17:22 AM)Diphtheria, Tetanus, and Pertussis (DTaP) Vaccine What You Need to Know WHY GET VACCINATED? Diphtheria, tetanus, and pertussis are serious diseases caused by bacteria. Diphtheria and pertussis are spread from person to person. Tetanus enters the body through cuts or wounds. Diphtheria causes a thick covering in the back of the throat.  It can lead to breathing problems, paralysis, heart failure, and even death.  Tetanus (Lockjaw) causes painful tightening of the muscles, usually all over the body.  It can lead to "locking" of the jaw so the victim cannot open his or her mouth or swallow. Tetanus leads to death in about 2 out of 10 cases.  Pertussis (Whooping Cough) causes coughing spells so bad that it is hard for infants to eat, drink, or breathe. These spells can last for weeks.  It can lead to pneumonia, seizures (jerking and staring spells), brain damage, and death.  Diphtheria, tetanus, and pertussis vaccine (DTaP) can help prevent these diseases. Most children who are vaccinated with DTaP will be protected throughout childhood. Many more children would get these diseases if we stopped vaccinating. DTaP is a safer version of an older vaccine called DTP. DTP is no longer used in the [place]. WHO SHOULD GET DTAP VACCINE AND WHEN? Children should get 5 doses of DTaP vaccine, 1 dose at each of the following ages:  2 months.   4 months.   6 months.   15 to 18 months.   4 to 6 years.  DTaP may be given at the same time as other vaccines. SOME CHILDREN SHOULD NOT GET DTAP VACCINE OR SHOULD WAIT  Children with minor illnesses, such as a cold, may be vaccinated. But children who are moderately or severely ill should usually wait until they recover before getting DTaP vaccine.   Any child who had a life-threatening allergic reaction after a dose of DTaP should not get another  dose.   Any child who suffered a brain or nervous system disease within 7 days after a dose of DTaP should not get another dose.   Talk with your caregiver if your child:   Had a seizure or collapsed after a dose of DTaP.   Cried non-stop for 3 hours or more after a dose of DTaP.   Had a fever over 105 F (40.6 C) after a dose of DTaP.   Ask your caregiver for more information. Some of these children should not get another dose of pertussis vaccine, but may get a vaccine without pertussis, called DT.  OLDER CHILDREN AND ADULTS  DTaP is not licensed for adolescents, adults, or children 57 years of age and older.   But older people still need protection. A vaccine called Tdap is similar to DTaP. A single dose of Tdap is recommended for people 11 through 69 years of age. Another vaccine, called Td, protects against tetanus and diphtheria, but not pertussis. It is recommended every 10 years.  WHAT ARE THE RISKS FROM DTAP VACCINE?  Getting diphtheria, tetanus, or pertussis disease is much riskier than getting DTaP vaccine.   However, a vaccine, like any medicine, is capable of causing serious problems, such as severe allergic reactions. The risk of DTaP vaccine causing serious harm, or death, is extremely small.  Mild Problems (Common)  Fever (up to about 1 child in 4).   Redness or  swelling where the shot was given (up to about 1 child in 4).   Soreness or tenderness where the shot was given (up to about 1 child in 4).  These problems occur more often after the 4th and 5th doses of the DTaP series than after earlier doses. Sometimes the 4th or 5th dose of DTaP vaccine is followed by swelling of the entire arm or leg in which the shot was given, lasting 1 to 7 days (up to about 1 child in 30). Other mild problems include:  Fussiness (up to about 1 child in 3).   Tiredness or poor appetite (up to about 1 child in 10).   Vomiting (up to about 1 child in 50).  These problems generally  occur 1 to 3 days after the shot. Moderate Problems (Uncommon)  Seizure (jerking or staring) (about 1 child out of 14,000).   Non-stop crying, for 3 hours or more (up to about 1 child out of 1,000).   High fever, over 105 F (40.6 C) (about 1 child out of 16,000).  Severe Problems (Very Rare)  Serious allergic reaction (less than 1 out of a million doses).   Several other severe problems have been reported after DTaP vaccine. These include:   Long-term seizures, coma, or lowered consciousness.   Permanent brain damage.  These are so rare it is hard to tell if they are caused by the vaccine. Controlling fever is especially important for children who have had seizures, for any reason. It is also important if another family member has had seizures. You can reduce fever and pain by giving your child an aspirin-free pain reliever when the shot is given, and for the next 24 hours, following the package instructions. WHAT IF THERE IS A MODERATE OR SEVERE REACTION? What should I look for? Any unusual conditions, such as a serious allergic reaction, high fever, or unusual behavior. Serious allergic reactions are extremely rare with any vaccine. If one were to occur, it would most likely be within a few minutes to a few hours after the shot. Signs can include difficulty breathing, hoarseness or wheezing, hives, paleness, weakness, a fast heartbeat, or dizziness. If a high fever or seizure were to occur, it would usually be within a week after the shot. What should I do?  Call your caregiver or get the person to a caregiver right away.   Tell the caregiver what happened, the date and time it happened, and when the vaccination was given.   Ask the caregiver, nurse, or health department to file a Vaccine Adverse Event Reporting System (VAERS) form. Or, you can file this report through the VAERS website at www.vaers.LAgents.no or by calling 1-(213)028-5397.  VAERS does not provide medical advice. THE  NATIONAL VACCINE INJURY COMPENSATION PROGRAM  In the rare event that you or your child has a serious reaction to a vaccine, a federal program has been created to help you pay for the care of those who have been harmed.   For details about the National Vaccine Injury Compensation Program, call 6023947230 or visit the program's website at SpiritualWord.at  HOW CAN I LEARN MORE?  Ask your caregiver. They can give you the vaccine package insert or suggest other sources of information.   Call your local or state health department's immunization program.   Contact the Centers for Disease Control and Prevention (CDC):   Call 236-464-2322 (1-800-CDC-INFO).   Visit the [employer] at PicCapture.uy  CDC Diphtheria, Tetanus, and Pertussis (DTaP) Vaccine  VIS (03/31/06) Document Released: 08/29/2006 Document Revised: 07/20/2011 Document Reviewed: 02/16/2008 [facility] Patient Information 2012 [place], [place].Sinusitis Sinuses are air pockets within the bones of your face. The growth of bacteria within a sinus leads to infection. The infection prevents the sinuses from draining. This infection is called sinusitis. SYMPTOMS  There will be different areas of pain depending on which sinuses have become infected.  The maxillary sinuses often produce pain beneath the eyes.   Frontal sinusitis may cause pain in the middle of the forehead and above the eyes.  Other problems (symptoms) include:  Toothaches.   Colored, pus-like (purulent) drainage from the nose.   Swelling, warmth, and tenderness over the sinus areas may be signs of infection.  TREATMENT  Sinusitis is most often determined by an exam.X-rays may be taken. If x-rays have been taken, make sure you obtain your results or find out how you are to obtain them. Your caregiver may give you medications (antibiotics). These are medications that will help kill the bacteria causing the infection.  You may also be given a medication (decongestant) that helps to reduce sinus swelling.  HOME CARE INSTRUCTIONS   Only take over-the-counter or prescription medicines for pain, discomfort, or fever as directed by your caregiver.   Drink extra fluids. Fluids help thin the mucus so your sinuses can drain more easily.   Applying either moist heat or ice packs to the sinus areas may help relieve discomfort.   Use saline nasal sprays to help moisten your sinuses. The sprays can be found at your local drugstore.  SEEK IMMEDIATE MEDICAL CARE IF:  You have a fever.   You have increasing pain, severe headaches, or toothache.   You have nausea, vomiting, or drowsiness.   You develop unusual swelling around the face or trouble seeing.  MAKE SURE YOU:   Understand these instructions.   Will watch your condition.   Will get help right away if you are not doing well or get worse.  Document Released: 11/01/2005 Document Revised: 07/14/2011 Document Reviewed: 05/31/2007 [facility] Patient Information 2012 [place], [place].

## 2011-12-23 NOTE — Progress Notes (Signed)
  Subjective:    Patient ID: Nicole Norton, female    DOB: 05/10/1941, 71 y.o.   MRN: [id number]  Sinusitis This is a new problem. The current episode started in the past 7 days (Started Sunday). The problem has been gradually worsening (she fels likes it is progressing to her chest) since onset. There has been no fever. Associated symptoms include congestion, coughing, ear pain, sinus pressure and a sore throat. Treatments tried: nyquil. The treatment provided no relief.  Cough This is a new problem. The current episode started in the past 7 days. The problem has been gradually worsening. The cough is productive of sputum. Associated symptoms include ear pain, nasal congestion and a sore throat. Associated symptoms comments: hoarseness. The symptoms are aggravated by lying down. She has tried OTC cough suppressant for the symptoms. The treatment provided no relief. Her past medical history is significant for bronchitis and pneumonia. There is no history of asthma, bronchiectasis, COPD or emphysema.   #2 immunization update done #3 hypertension  Review of Systems  HENT: Positive for ear pain, congestion, sore throat and sinus pressure.   Respiratory: Positive for cough.        General malaise  All other systems reviewed and are negative.      BP 132/80  Pulse 81  Temp(Src) 98.3 F (36.8 C) (Oral)  Ht 5\' 8"  (1.727 m)  Wt 233 lb (105.688 kg)  BMI 35.43 kg/m2  SpO2 96%  Objective:   Physical Exam  Nursing note and vitals reviewed. Constitutional: She is oriented to person, place, and time. She appears well-developed and well-nourished. No distress.  HENT:  Head: Normocephalic and atraumatic.  Right Ear: Tympanic membrane normal.  Left Ear: Tympanic membrane normal.  Nose: Nose normal.  Mouth/Throat: Oropharynx is clear and moist. No oropharyngeal exudate.  Eyes: Right eye exhibits no discharge. Left eye exhibits no discharge. No scleral icterus.  Neck: Neck supple.    Cardiovascular: Normal rate, regular rhythm and normal heart sounds.   Pulmonary/Chest: No respiratory distress. She has no wheezes. She has rhonchi. She has no rales.  Lymphadenopathy:    She has cervical adenopathy.  Neurological: She is alert and oriented to person, place, and time.  Skin: Skin is warm and dry.  Psychiatric: She has a normal mood and affect.          Assessment & Plan:  #1Sinusitis and Bronchitis Z pack and tusssionx #2 immunization up date patient refuses at this time DTaP pneumonia vaccination sinus and zoster vaccination. She states that her husband has severe rheumatoid arthritis and she's afraid of shedding viruses causing problems for him. She states that his rheumatologist discourage her from getting the Zostrix vaccination. Explained to her that while not sure about the Zostrix the tetanus and the current Pneumovax vaccination are not live viruses like polio and should not cause a problem with her husband. She can check with her her husband rheumatologist t to make sure. #3 hypertension blood pressure is currently under good control we'll maintain current blood pressure medicine at this time.  This patient confesses that she does not see doctors for health maintenance will schedule her for return visit for that in 4 months.

## 2011-12-28 ENCOUNTER — Ambulatory Visit
Admission: RE | Admit: 2011-12-28 | Discharge: 2011-12-28 | Disposition: A | Discharge status: Home / Self Care | Source: Ambulatory Visit | Attending: Family Medicine | Admitting: Family Medicine

## 2011-12-28 DIAGNOSIS — Z1231 Encounter for screening mammogram for malignant neoplasm of breast: Secondary | ICD-10-CM

## 2012-01-03 ENCOUNTER — Other Ambulatory Visit: Payer: Self-pay | Admitting: Family Medicine

## 2012-01-03 DIAGNOSIS — R928 Other abnormal and inconclusive findings on diagnostic imaging of breast: Secondary | ICD-10-CM

## 2012-01-10 ENCOUNTER — Other Ambulatory Visit: Payer: Self-pay

## 2012-01-10 ENCOUNTER — Inpatient Hospital Stay: Admission: RE | Admit: 2012-01-10 | Payer: Self-pay | Source: Ambulatory Visit

## 2012-01-10 DIAGNOSIS — R928 Other abnormal and inconclusive findings on diagnostic imaging of breast: Secondary | ICD-10-CM | POA: Diagnosis not present

## 2012-01-12 ENCOUNTER — Ambulatory Visit
Admission: RE | Admit: 2012-01-12 | Discharge: 2012-01-12 | Disposition: A | Discharge status: Home / Self Care | Payer: Medicare Other | Source: Ambulatory Visit | Attending: Family Medicine | Admitting: Family Medicine

## 2012-01-12 DIAGNOSIS — R928 Other abnormal and inconclusive findings on diagnostic imaging of breast: Secondary | ICD-10-CM

## 2012-01-13 DIAGNOSIS — Z961 Presence of intraocular lens: Secondary | ICD-10-CM | POA: Diagnosis not present

## 2012-02-14 ENCOUNTER — Ambulatory Visit
Admission: RE | Admit: 2012-02-14 | Discharge: 2012-02-14 | Disposition: A | Discharge status: Home / Self Care | Source: Ambulatory Visit | Attending: Physician Assistant | Admitting: Physician Assistant

## 2012-02-14 ENCOUNTER — Encounter: Payer: Self-pay | Admitting: Physician Assistant

## 2012-02-14 ENCOUNTER — Ambulatory Visit (INDEPENDENT_AMBULATORY_CARE_PROVIDER_SITE_OTHER): Admitting: Physician Assistant

## 2012-02-14 VITALS — BP 139/89 | HR 90 | Temp 98.2°F | Wt 233.0 lb

## 2012-02-14 DIAGNOSIS — J40 Bronchitis, not specified as acute or chronic: Secondary | ICD-10-CM

## 2012-02-14 MED ORDER — DOXYCYCLINE HYCLATE 100 MG PO CAPS: 100.0000 mg | ORAL_CAPSULE | Freq: Two times a day (BID) | ORAL | Status: DC

## 2012-02-14 MED ORDER — HYDROCOD POLST-CHLORPHEN POLST 10-8 MG/5ML PO LQCR: 5.0000 mL | Freq: Every evening | ORAL | Status: DC | PRN

## 2012-02-14 MED ORDER — PREDNISONE 10 MG PO TABS: 20.0000 mg | ORAL_TABLET | Freq: Every day | ORAL | Status: DC

## 2012-02-14 MED ORDER — ALBUTEROL SULFATE HFA 108 (90 BASE) MCG/ACT IN AERS: 2.0000 | INHALATION_SPRAY | Freq: Four times a day (QID) | RESPIRATORY_TRACT | Status: DC | PRN

## 2012-02-14 NOTE — Patient Instructions (Addendum)
Get chest xray. Will call with results today. Will call with antibiotic after I see films. Gave rx for cough syrup to use at night and sent to pharmacy albuterol inhaler to use every 4-6hours as needed for shortness of breath and wheezing. Stay hydrated.

## 2012-02-14 NOTE — Progress Notes (Signed)
  Subjective:    Patient ID: Nicole Norton, female    DOB: 1941-06-18, 71 y.o.   MRN: [id number]  HPI Patient presents to clinic with cough and not feeling well for over 2 weeks. 1 week ago patient took 3 tabs of a Zpak that she had at home and it didn't help, infact, she felt like it made it worse. Patient complains of chills, sweats, productive cough, fatigue, SOB, ear pressure, and sinus pressure. She denies any N/V. She reports that she did feel like she had a fever couple of days ago but did not take it. She has a history of pneumonia multiple times while she was young but has not had in many years. She has taken Nyquil, Dayquil, and Tussinex. She reports that medications have not helped very much. She has not used an inhaler. She has a really sick husband who needs a lot of attention due to his bad health and she has to get feeling better in order to take care of him.     Review of Systems     Objective:   Physical Exam  Constitutional: She is oriented to person, place, and time. She appears well-developed and well-nourished.       Obese. Sweating around hairline, under eyes, and nose.   HENT:  Head: Normocephalic and atraumatic.  Right Ear: External ear normal.  Left Ear: External ear normal.  Nose: Nose normal.  Mouth/Throat: Oropharynx is clear and moist. No oropharyngeal exudate.       TM's normal. Negative for maxillary tenderness.   Eyes: Conjunctivae are normal.  Neck: Normal range of motion. Neck supple.  Cardiovascular: Normal rate, regular rhythm and normal heart sounds.   Pulmonary/Chest:       SPO2- 91 percent. Coarse breath sounds.  Lymphadenopathy:    She has no cervical adenopathy.  Neurological: She is alert and oriented to person, place, and time.  Skin: Skin is warm.  Psychiatric: She has a normal mood and affect. Her behavior is normal.          Assessment & Plan:  Bronchitis- Chest xray was ordered results were negative for pneumonia. Patient was  called with results. She was given Hycodan for cough syrup to use at bedtime, Doxycycline twice a day for 10 days, and steroid for 5 days. Albuterol was also given to use for SOB to use as needed up to 4-6 hours. Stay hydrated and rest. Follow up in 1 week to recheck pulse ox.   Discussed need for Tetanus and Pnuemonia vaccine.

## 2012-02-24 ENCOUNTER — Ambulatory Visit (INDEPENDENT_AMBULATORY_CARE_PROVIDER_SITE_OTHER): Payer: Medicare Other | Admitting: Family Medicine

## 2012-02-24 ENCOUNTER — Encounter: Payer: Self-pay | Admitting: Family Medicine

## 2012-02-24 VITALS — BP 120/78 | HR 93 | Temp 98.2°F | Ht 68.0 in | Wt 228.0 lb

## 2012-02-24 DIAGNOSIS — J40 Bronchitis, not specified as acute or chronic: Secondary | ICD-10-CM | POA: Diagnosis not present

## 2012-02-24 DIAGNOSIS — J309 Allergic rhinitis, unspecified: Secondary | ICD-10-CM

## 2012-02-24 MED ORDER — DOXYCYCLINE HYCLATE 100 MG PO CAPS: 100.0000 mg | ORAL_CAPSULE | Freq: Two times a day (BID) | ORAL | Status: AC

## 2012-02-24 MED ORDER — FLUTICASONE PROPIONATE 50 MCG/ACT NA SUSP: 1.0000 | Freq: Every day | NASAL | Status: DC

## 2012-02-24 NOTE — Progress Notes (Signed)
  Subjective:    Patient ID: Nicole Norton, female    DOB: Nov 18, 1940, 71 y.o.   MRN: [id number]  HPI F/U from bronchitis. Was in her about 10 days ago. Put on predisone and completed her doxycycline yesterday.  Cough is still there but not completely  Gone. Was suing ventolin inhaler.  Felt better the last 2 days. Gets SOB with shower. No fever. She has had year-round allergies and takes Zyrtec daily. That with the increase in pollen in the last week she feels her allergies got a little bit worse with Raynaud's and some pressure across her maxillary sinuses.   Review of Systems     Objective:   Physical Exam  Constitutional: She is oriented to person, place, and time. She appears well-developed and well-nourished.  HENT:  Head: Normocephalic and atraumatic.  Right Ear: External ear normal.  Left Ear: External ear normal.  Nose: Nose normal.  Mouth/Throat: Oropharynx is clear and moist.       TMs and canals are clear.   Eyes: Conjunctivae and EOM are normal. Pupils are equal, round, and reactive to light.  Neck: Neck supple. No thyromegaly present.  Cardiovascular: Normal rate, regular rhythm and normal heart sounds.   Pulmonary/Chest: Effort normal and breath sounds normal. She has no wheezes.  Lymphadenopathy:    She has no cervical adenopathy.  Neurological: She is alert and oriented to person, place, and time.  Skin: Skin is warm and dry.  Psychiatric: She has a normal mood and affect.          Assessment & Plan:  Bronchitis-IMproving. I think she is significantly improved. I will call him: 4 more days of doxycycline to complete a 14 day course she is still noticing some discomfort and cough in her chest. No axillary or the cough as well as the last thing to clear as often there is cough and mucus with reparative changes after an illness.  AR - Worsening. TAkes zyrtec daliy.  We discussed adding a nasal steroid for better control for spring and fall she does have year-round  indoor and outdoor allergies. We reviewed how to appropriately use a nasal steroid spray and how to apply it.  When She is feeling better I encouraged her pneumonia vaccine and her tetanus.

## 2012-02-24 NOTE — Patient Instructions (Addendum)
Call if not better into next week.   When She is feeling better I encouraged her pneumonia vaccine and her tetanus.

## 2012-03-06 ENCOUNTER — Telehealth: Payer: Self-pay | Admitting: *Deleted

## 2012-03-06 MED ORDER — ALPRAZOLAM 0.5 MG PO TABS: 0.5000 mg | ORAL_TABLET | Freq: Every day | ORAL | Status: DC

## 2012-03-10 ENCOUNTER — Other Ambulatory Visit: Payer: Self-pay | Admitting: *Deleted

## 2012-03-10 MED ORDER — ALPRAZOLAM 0.5 MG PO TABS: 0.5000 mg | ORAL_TABLET | Freq: Every day | ORAL | Status: DC

## 2012-03-10 MED ORDER — LANSOPRAZOLE 30 MG PO CPDR: 30.0000 mg | DELAYED_RELEASE_CAPSULE | Freq: Every day | ORAL | Status: DC

## 2012-03-10 NOTE — Telephone Encounter (Signed)
Pt states she will be changing her pharmacy to a local pharmacy on next refills.

## 2012-03-22 ENCOUNTER — Other Ambulatory Visit: Payer: Self-pay | Admitting: Family Medicine

## 2012-04-19 ENCOUNTER — Other Ambulatory Visit: Payer: Self-pay | Admitting: Family Medicine

## 2012-05-08 ENCOUNTER — Other Ambulatory Visit: Payer: Self-pay | Admitting: *Deleted

## 2012-05-08 MED ORDER — LEVOTHYROXINE SODIUM 175 MCG PO TABS: 175.0000 ug | ORAL_TABLET | Freq: Every day | ORAL | Status: DC

## 2012-06-05 ENCOUNTER — Other Ambulatory Visit: Payer: Self-pay | Admitting: *Deleted

## 2012-06-05 ENCOUNTER — Other Ambulatory Visit: Payer: Self-pay | Admitting: Family Medicine

## 2012-06-05 MED ORDER — ALPRAZOLAM 0.5 MG PO TABS: 0.5000 mg | ORAL_TABLET | Freq: Every day | ORAL | Status: DC

## 2012-06-07 ENCOUNTER — Other Ambulatory Visit: Payer: Self-pay | Admitting: Family Medicine

## 2012-06-08 ENCOUNTER — Other Ambulatory Visit: Payer: Self-pay | Admitting: Family Medicine

## 2012-06-16 ENCOUNTER — Other Ambulatory Visit: Payer: Self-pay | Admitting: Family Medicine

## 2012-07-18 ENCOUNTER — Other Ambulatory Visit: Payer: Self-pay | Admitting: Family Medicine

## 2012-08-14 ENCOUNTER — Other Ambulatory Visit: Payer: Self-pay | Admitting: Family Medicine

## 2012-09-04 ENCOUNTER — Other Ambulatory Visit: Payer: Self-pay | Admitting: *Deleted

## 2012-09-04 MED ORDER — ALPRAZOLAM 0.5 MG PO TABS: 0.5000 mg | ORAL_TABLET | Freq: Every day | ORAL | Status: DC

## 2012-09-19 ENCOUNTER — Other Ambulatory Visit: Payer: Self-pay | Admitting: Family Medicine

## 2012-10-09 ENCOUNTER — Other Ambulatory Visit: Payer: Self-pay | Admitting: Family Medicine

## 2012-10-17 ENCOUNTER — Other Ambulatory Visit: Payer: Self-pay | Admitting: Family Medicine

## 2012-10-17 NOTE — Telephone Encounter (Signed)
Needs appointment

## 2012-11-09 ENCOUNTER — Other Ambulatory Visit: Payer: Self-pay | Admitting: *Deleted

## 2012-11-09 ENCOUNTER — Other Ambulatory Visit: Payer: Self-pay | Admitting: Family Medicine

## 2012-11-09 MED ORDER — ALPRAZOLAM 0.5 MG PO TABS: 0.5000 mg | ORAL_TABLET | Freq: Every day | ORAL | Status: DC

## 2012-11-13 ENCOUNTER — Telehealth: Payer: Self-pay | Admitting: *Deleted

## 2012-11-13 ENCOUNTER — Other Ambulatory Visit: Payer: Self-pay | Admitting: *Deleted

## 2012-11-13 MED ORDER — ESTROGENS CONJUGATED 0.625 MG PO TABS: 0.6250 mg | ORAL_TABLET | Freq: Every day | ORAL | Status: DC

## 2012-11-13 MED ORDER — OLMESARTAN-AMLODIPINE-HCTZ 40-5-25 MG PO TABS: ORAL_TABLET | ORAL | Status: DC

## 2012-11-13 NOTE — Telephone Encounter (Signed)
Refill

## 2012-11-13 NOTE — Telephone Encounter (Signed)
Refill medication

## 2012-11-17 ENCOUNTER — Other Ambulatory Visit: Payer: Self-pay | Admitting: *Deleted

## 2012-11-17 MED ORDER — DICLOFENAC SODIUM 75 MG PO TBEC: 75.0000 mg | DELAYED_RELEASE_TABLET | Freq: Two times a day (BID) | ORAL | Status: DC

## 2012-11-20 ENCOUNTER — Telehealth: Payer: Self-pay

## 2012-11-20 DIAGNOSIS — Z803 Family history of malignant neoplasm of breast: Secondary | ICD-10-CM

## 2012-11-20 DIAGNOSIS — N644 Mastodynia: Secondary | ICD-10-CM

## 2012-11-20 NOTE — Telephone Encounter (Signed)
Order placed. They should be contacting her thsi week.

## 2012-11-20 NOTE — Telephone Encounter (Signed)
Pt needs order for mammogram put into system.  She knows that she is not due until 12/27/12 but she is having pain in her Lt. Breast x 5 days.  Mother died from Breast Cancer and she is concerned.

## 2012-11-21 NOTE — Telephone Encounter (Signed)
Pt.notified

## 2012-11-29 ENCOUNTER — Ambulatory Visit
Admission: RE | Admit: 2012-11-29 | Discharge: 2012-11-29 | Disposition: A | Discharge status: Home / Self Care | Payer: Medicare Other | Source: Ambulatory Visit | Attending: Family Medicine | Admitting: Family Medicine

## 2012-11-29 DIAGNOSIS — N644 Mastodynia: Secondary | ICD-10-CM | POA: Diagnosis not present

## 2012-11-29 DIAGNOSIS — Z803 Family history of malignant neoplasm of breast: Secondary | ICD-10-CM

## 2012-12-04 ENCOUNTER — Other Ambulatory Visit: Payer: Self-pay | Admitting: Family Medicine

## 2012-12-04 ENCOUNTER — Other Ambulatory Visit: Payer: Self-pay | Admitting: Sports Medicine

## 2012-12-10 ENCOUNTER — Other Ambulatory Visit: Payer: Self-pay | Admitting: Sports Medicine

## 2012-12-15 ENCOUNTER — Ambulatory Visit (INDEPENDENT_AMBULATORY_CARE_PROVIDER_SITE_OTHER): Payer: Medicare Other | Admitting: Family Medicine

## 2012-12-15 ENCOUNTER — Encounter: Payer: Self-pay | Admitting: Family Medicine

## 2012-12-15 ENCOUNTER — Ambulatory Visit: Payer: Self-pay | Admitting: Family Medicine

## 2012-12-15 VITALS — BP 148/104 | HR 84 | Resp 18 | Wt 242.0 lb

## 2012-12-15 DIAGNOSIS — I10 Essential (primary) hypertension: Secondary | ICD-10-CM

## 2012-12-15 DIAGNOSIS — E669 Obesity, unspecified: Secondary | ICD-10-CM

## 2012-12-15 DIAGNOSIS — E785 Hyperlipidemia, unspecified: Secondary | ICD-10-CM | POA: Diagnosis not present

## 2012-12-15 DIAGNOSIS — E039 Hypothyroidism, unspecified: Secondary | ICD-10-CM

## 2012-12-15 DIAGNOSIS — N644 Mastodynia: Secondary | ICD-10-CM

## 2012-12-15 LAB — LIPID PANEL
Cholesterol: 344 mg/dL — ABNORMAL HIGH (ref 0–200)
Triglycerides: 307 mg/dL — ABNORMAL HIGH (ref <150)
VLDL: 61 mg/dL — ABNORMAL HIGH (ref 0–40)

## 2012-12-15 LAB — COMPLETE METABOLIC PANEL WITH GFR
AST: 51 U/L — ABNORMAL HIGH (ref 0–37)
BUN: 31 mg/dL — ABNORMAL HIGH (ref 6–23)
Calcium: 10 mg/dL (ref 8.4–10.5)
Chloride: 106 mEq/L (ref 96–112)
Creat: 1.11 mg/dL — ABNORMAL HIGH (ref 0.50–1.10)
GFR, Est African American: 59 mL/min — ABNORMAL LOW
GFR, Est Non African American: 51 mL/min — ABNORMAL LOW
Glucose, Bld: 91 mg/dL (ref 70–99)

## 2012-12-15 MED ORDER — DICLOFENAC SODIUM 75 MG PO TBEC: 75.0000 mg | DELAYED_RELEASE_TABLET | Freq: Two times a day (BID) | ORAL | Status: DC

## 2012-12-15 MED ORDER — ALPRAZOLAM 0.5 MG PO TABS: 0.5000 mg | ORAL_TABLET | Freq: Every day | ORAL | Status: DC

## 2012-12-15 MED ORDER — ESTROGENS CONJUGATED 0.625 MG PO TABS: 0.6250 mg | ORAL_TABLET | Freq: Every day | ORAL | Status: DC

## 2012-12-15 MED ORDER — LANSOPRAZOLE 30 MG PO CPDR: 30.0000 mg | DELAYED_RELEASE_CAPSULE | Freq: Every day | ORAL | Status: DC

## 2012-12-15 MED ORDER — OLMESARTAN-AMLODIPINE-HCTZ 40-10-25 MG PO TABS: 1.0000 | ORAL_TABLET | Freq: Every day | ORAL | Status: DC

## 2012-12-15 MED ORDER — LEVOTHYROXINE SODIUM 175 MCG PO TABS: 175.0000 ug | ORAL_TABLET | Freq: Every day | ORAL | Status: DC

## 2012-12-15 NOTE — Patient Instructions (Signed)

## 2012-12-15 NOTE — Progress Notes (Signed)
  Subjective:    Patient ID: Nicole Norton, female    DOB: 1941/01/14, 72 y.o.   MRN: [id number]  HPI HTN-  Pt denies chest pain, SOB, dizziness, or heart palpitations.  Taking meds as directed w/o problems.  Denies medication side effects. Says feels her BP was coming up in the last month.  Takes her Tribenzor daily w/ o SE.    Hyperlpidemia - she's not currently on a statin. She says she has intolerance which is due to recheck her lipids. She's gained about 14 pounds since I last saw her. She says she just hasn't been as active as her husband is very ill and she's been primarily taking care of him.  Hypothyroidism- she's asymptomatic. She denies any significant fatigue. She has gained weight but feels it's because she hasn't been nearly as active as she would normally be this time a year.  She did have some breast pain on the left and so went ahead and schedule her mammogram earlier this month. It was normal which is reassuring. She's getting somewhat better.   Review of Systems     Objective:   Physical Exam  Constitutional: She is oriented to person, place, and time. She appears well-developed and well-nourished.  HENT:  Head: Normocephalic and atraumatic.  Cardiovascular: Normal rate, regular rhythm and normal heart sounds.        No carotid bruits.   Pulmonary/Chest: Effort normal and breath sounds normal.  Musculoskeletal: She exhibits no edema.  Neurological: She is alert and oriented to person, place, and time.  Skin: Skin is warm and dry.  Psychiatric: She has a normal mood and affect. Her behavior is normal.          Assessment & Plan:  HTN- uncontrolled today. We will increase her dose on her Tribenzor. Samples given today.  REmind her watch the salt in her diet.   Strongly encouraged her to work on regular exercise and low-fat diet. She's gained about 14 pounds I last saw her needs to start working on this to get her blood pressure back under control. Followup in 6  weeks.  Hyperlipidemia - do that we'll recheck levels today. /doesn't tolerate statins   Hypothyroid- she feels her level was well controlled. Recheck today. Certainly if it is off this could be contributing to her recent increase in blood pressure.  Left breast pain-make sure she's wearing a good supportive bra and make sure she gets refitted sometime soon especially with the weight gain. Mammogram was normal.  Obesity - Has gained 14 lbs in the last few months. Work on diet and exericise.

## 2012-12-15 NOTE — Addendum Note (Signed)
Addended by: Deno Etienne on: 12/15/2012 10:49 AM   Modules accepted: Orders

## 2012-12-18 ENCOUNTER — Other Ambulatory Visit: Payer: Self-pay | Admitting: Family Medicine

## 2012-12-18 MED ORDER — LEVOTHYROXINE SODIUM 200 MCG PO TABS: 200.0000 ug | ORAL_TABLET | Freq: Every day | ORAL | Status: DC

## 2013-01-26 ENCOUNTER — Ambulatory Visit: Payer: Self-pay | Admitting: Family Medicine

## 2013-02-18 ENCOUNTER — Other Ambulatory Visit: Payer: Self-pay | Admitting: Family Medicine

## 2013-03-15 ENCOUNTER — Other Ambulatory Visit: Payer: Self-pay | Admitting: Family Medicine

## 2013-03-19 ENCOUNTER — Other Ambulatory Visit: Payer: Self-pay | Admitting: Family Medicine

## 2013-04-02 ENCOUNTER — Other Ambulatory Visit: Payer: Self-pay | Admitting: *Deleted

## 2013-04-02 MED ORDER — ESTROGENS CONJUGATED 0.625 MG PO TABS: 0.6250 mg | ORAL_TABLET | Freq: Every day | ORAL | Status: DC

## 2013-04-02 MED ORDER — OLMESARTAN-AMLODIPINE-HCTZ 40-10-25 MG PO TABS: 1.0000 | ORAL_TABLET | Freq: Every day | ORAL | Status: DC

## 2013-04-02 NOTE — Telephone Encounter (Signed)
According to last ov pt was to f/u in 6 wks .Marland KitchenLoralee Pacas [place]

## 2013-04-10 ENCOUNTER — Other Ambulatory Visit: Payer: Self-pay | Admitting: Family Medicine

## 2013-04-17 ENCOUNTER — Other Ambulatory Visit: Payer: Self-pay | Admitting: Family Medicine

## 2013-04-25 ENCOUNTER — Other Ambulatory Visit: Payer: Self-pay | Admitting: *Deleted

## 2013-04-25 MED ORDER — OLMESARTAN-AMLODIPINE-HCTZ 40-10-25 MG PO TABS: ORAL_TABLET | ORAL | Status: DC

## 2013-04-25 MED ORDER — ESTROGENS CONJUGATED 0.625 MG PO TABS: 0.6250 mg | ORAL_TABLET | Freq: Every day | ORAL | Status: DC

## 2013-06-12 ENCOUNTER — Other Ambulatory Visit: Payer: Self-pay | Admitting: Family Medicine

## 2013-06-14 ENCOUNTER — Telehealth: Payer: Self-pay | Admitting: Family Medicine

## 2013-06-14 NOTE — Telephone Encounter (Signed)
Nicole Norton  

## 2013-06-14 NOTE — Telephone Encounter (Signed)
Nicole Norton has made an appointment to see Dr on 8/5 and would like med called  in to [facility].

## 2013-06-15 ENCOUNTER — Other Ambulatory Visit: Payer: Self-pay | Admitting: Family Medicine

## 2013-06-15 ENCOUNTER — Other Ambulatory Visit: Payer: Self-pay | Admitting: *Deleted

## 2013-06-15 NOTE — Telephone Encounter (Signed)
Refill sent.Nicole Norton [place]  

## 2013-06-19 ENCOUNTER — Ambulatory Visit (INDEPENDENT_AMBULATORY_CARE_PROVIDER_SITE_OTHER): Payer: Medicare Other | Admitting: Family Medicine

## 2013-06-19 ENCOUNTER — Encounter: Payer: Self-pay | Admitting: Family Medicine

## 2013-06-19 VITALS — BP 132/84 | HR 81 | Ht 66.0 in | Wt 235.0 lb

## 2013-06-19 DIAGNOSIS — E785 Hyperlipidemia, unspecified: Secondary | ICD-10-CM | POA: Diagnosis not present

## 2013-06-19 DIAGNOSIS — E039 Hypothyroidism, unspecified: Secondary | ICD-10-CM | POA: Diagnosis not present

## 2013-06-19 DIAGNOSIS — I10 Essential (primary) hypertension: Secondary | ICD-10-CM | POA: Diagnosis not present

## 2013-06-19 MED ORDER — ESTROGENS CONJUGATED 0.625 MG PO TABS: 0.6250 mg | ORAL_TABLET | Freq: Every day | ORAL | Status: DC

## 2013-06-19 MED ORDER — DICLOFENAC SODIUM 75 MG PO TBEC: DELAYED_RELEASE_TABLET | ORAL | Status: DC

## 2013-06-19 MED ORDER — OLMESARTAN-AMLODIPINE-HCTZ 40-10-25 MG PO TABS: ORAL_TABLET | ORAL | Status: DC

## 2013-06-19 MED ORDER — LEVOTHYROXINE SODIUM 200 MCG PO TABS: ORAL_TABLET | ORAL | Status: DC
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
## 2013-06-21 ENCOUNTER — Other Ambulatory Visit: Payer: Self-pay | Admitting: Family Medicine

## 2013-06-21 DIAGNOSIS — R748 Abnormal levels of other serum enzymes: Secondary | ICD-10-CM

## 2013-06-21 DIAGNOSIS — E785 Hyperlipidemia, unspecified: Secondary | ICD-10-CM | POA: Diagnosis not present

## 2013-06-21 DIAGNOSIS — I10 Essential (primary) hypertension: Secondary | ICD-10-CM | POA: Diagnosis not present

## 2013-06-21 DIAGNOSIS — E039 Hypothyroidism, unspecified: Secondary | ICD-10-CM | POA: Diagnosis not present

## 2013-06-21 LAB — COMPLETE METABOLIC PANEL WITH GFR
ALT: 47 U/L — ABNORMAL HIGH (ref 0–35)
AST: 65 U/L — ABNORMAL HIGH (ref 0–37)
CO2: 25 mEq/L (ref 19–32)
Calcium: 9.3 mg/dL (ref 8.4–10.5)
Chloride: 107 mEq/L (ref 96–112)
Creat: 1.17 mg/dL — ABNORMAL HIGH (ref 0.50–1.10)
GFR, Est African American: 55 mL/min — ABNORMAL LOW
Sodium: 141 mEq/L (ref 135–145)
Total Bilirubin: 0.4 mg/dL (ref 0.3–1.2)
Total Protein: 7 g/dL (ref 6.0–8.3)

## 2013-06-21 LAB — LIPID PANEL
HDL: 47 mg/dL (ref >39)
LDL Cholesterol: 185 mg/dL — ABNORMAL HIGH (ref 0–99)
Triglycerides: 305 mg/dL — ABNORMAL HIGH (ref <150)
VLDL: 61 mg/dL — ABNORMAL HIGH (ref 0–40)

## 2013-06-21 LAB — TSH: TSH: 7.062 u[IU]/mL — ABNORMAL HIGH (ref 0.350–4.500)

## 2013-06-21 MED ORDER — LEVOTHYROXINE SODIUM 25 MCG PO CAPS: 1.0000 | ORAL_CAPSULE | Freq: Every day | ORAL | Status: DC

## 2013-06-27 ENCOUNTER — Telehealth: Payer: Self-pay | Admitting: *Deleted

## 2013-06-27 NOTE — Telephone Encounter (Signed)
Linda with [facility] in [place] calls to let you know that when she called patient to schedule her U/S patient told her she did not want to have it done.  FYI

## 2013-07-06 ENCOUNTER — Telehealth: Payer: Self-pay | Admitting: *Deleted

## 2013-07-06 NOTE — Telephone Encounter (Signed)
Pt called and informed that her Rx has been called in to [facility].Loralee Pacas [place]

## 2013-09-10 ENCOUNTER — Other Ambulatory Visit: Payer: Self-pay | Admitting: Family Medicine

## 2013-09-13 ENCOUNTER — Other Ambulatory Visit: Payer: Self-pay | Admitting: Family Medicine

## 2013-11-18 ENCOUNTER — Other Ambulatory Visit: Payer: Self-pay | Admitting: Family Medicine

## 2013-11-21 ENCOUNTER — Encounter: Payer: Self-pay | Admitting: Physician Assistant

## 2013-11-21 ENCOUNTER — Ambulatory Visit (INDEPENDENT_AMBULATORY_CARE_PROVIDER_SITE_OTHER): Payer: Medicare Other | Admitting: Physician Assistant

## 2013-11-21 VITALS — BP 147/90 | HR 79 | Temp 97.9°F | Wt 236.0 lb

## 2013-11-21 DIAGNOSIS — J209 Acute bronchitis, unspecified: Secondary | ICD-10-CM | POA: Diagnosis not present

## 2013-11-21 MED ORDER — METHYLPREDNISOLONE SODIUM SUCC 125 MG IJ SOLR
125.0000 mg | Freq: Once | INTRAMUSCULAR | Status: AC
  Administered 2013-11-21: 125 mg via INTRAMUSCULAR

## 2013-11-21 MED ORDER — DOXYCYCLINE HYCLATE 100 MG PO CAPS: 100.0000 mg | ORAL_CAPSULE | Freq: Two times a day (BID) | ORAL | Status: DC

## 2013-11-21 MED ORDER — HYDROCOD POLST-CHLORPHEN POLST 10-8 MG/5ML PO LQCR: 5.0000 mL | Freq: Every evening | ORAL | Status: DC | PRN

## 2013-11-21 NOTE — Patient Instructions (Signed)

## 2013-11-21 NOTE — Progress Notes (Signed)
   Subjective:    Patient ID: Nicole Norton, female    DOB: 1940/11/23, 73 y.o.   MRN: [id number]  HPI Pt present to the clinic with 1 month of cough, ear pain, ST, sinus congestion. Hx of smoking but quit in 2001. Using nyquil, dayquil, cough drops, flonase, afrin. Cough is productive with yellowish sputum. Denies any fever, chills, nausea, vomiting, or diarrhea. Denies any wheezing but has felt SOB. Her chest does feel tight.     Review of Systems     Objective:   Physical Exam  Constitutional: She is oriented to person, place, and time. She appears well-developed and well-nourished.  HENT:  Head: Normocephalic and atraumatic.  Right Ear: External ear normal.  Left Ear: External ear normal.  TM's clear bilaterally.  Oropharynx erythematous, tonsils not enlarged.  Nasal turbinates red and swollen.  Negative maxillary or frontal sinus tenderness.   Eyes: Conjunctivae are normal.  Neck: Normal range of motion. Neck supple.  Cardiovascular: Normal rate, regular rhythm and normal heart sounds.   Pulmonary/Chest: Effort normal and breath sounds normal. She has no wheezes.  Cough with deep breath.   Lymphadenopathy:    She has no cervical adenopathy.  Neurological: She is alert and oriented to person, place, and time.  Skin: Skin is warm and dry.  Psychiatric: She has a normal mood and affect. Her behavior is normal.          Assessment & Plan:  Acute bronchitis- Solumedrol 125mg  given in office today. Doxycycline for 10 days. Tussinonex for cough. Symptomatic care given. Stop afrin. Can continue flonase. Call if not improving.

## 2013-11-22 ENCOUNTER — Telehealth: Payer: Self-pay | Admitting: *Deleted

## 2013-11-22 NOTE — Telephone Encounter (Signed)
Ok added to allergies.

## 2013-11-22 NOTE — Telephone Encounter (Signed)
Pt called stating that after she got home from the office yesterday after receiving solu medrol injection, she had some heart palpitations that last for around 5 hours then she got a bad HA.  She stated that she feels better today but wanted to let us know that she would prefer to just take oral prednisone in the future.

## 2013-12-12 ENCOUNTER — Other Ambulatory Visit: Payer: Self-pay | Admitting: Family Medicine

## 2014-01-14 ENCOUNTER — Other Ambulatory Visit: Payer: Self-pay | Admitting: Family Medicine

## 2014-01-16 ENCOUNTER — Other Ambulatory Visit: Payer: Self-pay

## 2014-01-16 DIAGNOSIS — Z1231 Encounter for screening mammogram for malignant neoplasm of breast: Secondary | ICD-10-CM

## 2014-02-07 ENCOUNTER — Ambulatory Visit
Admission: RE | Admit: 2014-02-07 | Discharge: 2014-02-07 | Disposition: A | Discharge status: Home / Self Care | Payer: Medicare Other | Source: Ambulatory Visit

## 2014-02-07 DIAGNOSIS — Z1231 Encounter for screening mammogram for malignant neoplasm of breast: Secondary | ICD-10-CM | POA: Diagnosis not present

## 2014-02-12 ENCOUNTER — Other Ambulatory Visit: Payer: Self-pay | Admitting: Family Medicine

## 2014-02-14 ENCOUNTER — Other Ambulatory Visit: Payer: Self-pay | Admitting: Family Medicine

## 2014-02-20 ENCOUNTER — Other Ambulatory Visit: Payer: Self-pay | Admitting: Family Medicine

## 2014-03-18 ENCOUNTER — Other Ambulatory Visit: Payer: Self-pay | Admitting: Family Medicine

## 2014-03-18 ENCOUNTER — Telehealth: Payer: Self-pay | Admitting: Family Medicine

## 2014-03-18 ENCOUNTER — Ambulatory Visit (INDEPENDENT_AMBULATORY_CARE_PROVIDER_SITE_OTHER): Payer: Medicare Other | Admitting: Family Medicine

## 2014-03-18 ENCOUNTER — Encounter: Payer: Self-pay | Admitting: Family Medicine

## 2014-03-18 VITALS — BP 140/98 | HR 67 | Wt 238.0 lb

## 2014-03-18 DIAGNOSIS — R350 Frequency of micturition: Secondary | ICD-10-CM | POA: Insufficient documentation

## 2014-03-18 DIAGNOSIS — Z833 Family history of diabetes mellitus: Secondary | ICD-10-CM

## 2014-03-18 DIAGNOSIS — E785 Hyperlipidemia, unspecified: Secondary | ICD-10-CM | POA: Diagnosis not present

## 2014-03-18 DIAGNOSIS — F341 Dysthymic disorder: Secondary | ICD-10-CM

## 2014-03-18 DIAGNOSIS — I10 Essential (primary) hypertension: Secondary | ICD-10-CM | POA: Diagnosis not present

## 2014-03-18 DIAGNOSIS — M2550 Pain in unspecified joint: Secondary | ICD-10-CM

## 2014-03-18 DIAGNOSIS — E039 Hypothyroidism, unspecified: Secondary | ICD-10-CM

## 2014-03-18 DIAGNOSIS — Z23 Encounter for immunization: Secondary | ICD-10-CM | POA: Diagnosis not present

## 2014-03-18 DIAGNOSIS — F418 Other specified anxiety disorders: Secondary | ICD-10-CM

## 2014-03-18 LAB — POCT UA - MICROALBUMIN
Albumin/Creatinine Ratio, Urine, POC: <30
Creatinine, POC: 300 mg/dL
Microalbumin Ur, POC: 30 mg/L

## 2014-03-18 LAB — BASIC METABOLIC PANEL WITH GFR
BUN: 28 mg/dL — ABNORMAL HIGH (ref 6–23)
CALCIUM: 10.1 mg/dL (ref 8.4–10.5)
CO2: 25 mEq/L (ref 19–32)
CREATININE: 1.01 mg/dL (ref 0.50–1.10)
Chloride: 104 mEq/L (ref 96–112)
GFR, EST NON AFRICAN AMERICAN: 57 mL/min — AB
GFR, Est African American: 65 mL/min
Glucose, Bld: 102 mg/dL — ABNORMAL HIGH (ref 70–99)
Potassium: 4.9 mEq/L (ref 3.5–5.3)
Sodium: 141 mEq/L (ref 135–145)

## 2014-03-18 LAB — TSH: TSH: 1.809 u[IU]/mL (ref 0.350–4.500)

## 2014-03-18 LAB — POCT GLYCOSYLATED HEMOGLOBIN (HGB A1C): HEMOGLOBIN A1C: 5.4

## 2014-03-18 MED ORDER — FLUTICASONE PROPIONATE 50 MCG/ACT NA SUSP: 1.0000 | Freq: Every day | NASAL | Status: DC

## 2014-03-18 MED ORDER — ESTROGENS CONJUGATED 0.625 MG PO TABS: 0.6250 mg | ORAL_TABLET | Freq: Every day | ORAL | Status: DC

## 2014-03-18 MED ORDER — DICLOFENAC SODIUM 75 MG PO TBEC: DELAYED_RELEASE_TABLET | ORAL | Status: DC

## 2014-03-18 MED ORDER — ALPRAZOLAM 0.5 MG PO TABS
ORAL_TABLET | ORAL | Status: DC
Start: 2014-03-18 — End: 2014-06-17

## 2014-03-18 MED ORDER — OLMESARTAN-AMLODIPINE-HCTZ 40-10-25 MG PO TABS: ORAL_TABLET | ORAL | Status: DC

## 2014-03-18 NOTE — Progress Notes (Signed)
   Subjective:    Patient ID: Nicole Norton, female    DOB: March 12, 1941, 73 y.o.   MRN: [id number]  HPI Hypertension- Pt denies chest pain, SOB, dizziness, or heart palpitations.  Taking meds as directed w/o problems.  Denies medication side effects.    Hypothyroidism-no recent skin or hair changes. No recent changes in weight. Does need refills on the medication. Lab Results  Component Value Date   TSH 7.062* 06/21/2013   + fam hx of DM.  Has noticed some inc urination recently. But not inc thirst. Would like to tested for DM.  Depression/Anxiety - ran out of xanax last week and hasn't been sleeping well without.   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 Can't miss a day or has a really hard time. Feels like her joints have been flaring.  No dysuria. No hematuria.   Review of Systems     Objective:   Physical Exam  Constitutional: She is oriented to person, place, and time. She appears well-developed and well-nourished.  HENT:  Head: Normocephalic and atraumatic.  Cardiovascular: Normal rate, regular rhythm and normal heart sounds.   Pulmonary/Chest: Effort normal and breath sounds normal.  Neurological: She is alert and oriented to person, place, and time.  Skin: Skin is warm and dry.  Psychiatric: She has a normal mood and affect. Her behavior is normal.          Assessment & Plan:  HTN - fairly well controlled with diastolics just borderline elevated. Continue current regimen. Due for BMP. Followup in 6 months.  Hypothyroidism-she's not repeated her levels and adjusting her medication. We will recheck it again today and make sure she is well-controlled before refilling the medication.  + fam hx of DM - will test A1C today.  A1c was in the normal range today. Gave patient reassurance. Lab Results  Component Value Date   HGBA1C 5.4 03/18/2014   Depression/Anxiety - will refill her xanax. Would love for her to wean off it. Discussed dependency issues.   She can start by cutting it in half since she experienced insomnia when she stopped it completely.  Polyathralgia - Can consider adding tramadol for break thought pain. She will think aobut it. Continue diclofenac for now. Also discussed a trial of Tylenol arthritis on days where she's having increased pain levels and her diclofenac is not quite enough. Study showed this actually improved pain by about 30% which can make her more functional for the day.

## 2014-03-18 NOTE — Telephone Encounter (Signed)
Please call pt: Please let her that her A1c was well within the normal range. She's not even prediabetic. We can check again in one year.

## 2014-03-18 NOTE — Telephone Encounter (Signed)
Pt informed.Nicole Norton [name]  

## 2014-03-19 ENCOUNTER — Other Ambulatory Visit: Payer: Self-pay | Admitting: *Deleted

## 2014-03-19 MED ORDER — LEVOTHYROXINE SODIUM 200 MCG PO TABS: ORAL_TABLET | ORAL | Status: DC

## 2014-03-19 MED ORDER — LEVOTHYROXINE SODIUM 25 MCG PO TABS: ORAL_TABLET | ORAL | Status: DC

## 2014-06-17 ENCOUNTER — Other Ambulatory Visit: Payer: Self-pay | Admitting: *Deleted

## 2014-06-17 ENCOUNTER — Other Ambulatory Visit: Payer: Self-pay | Admitting: Family Medicine

## 2014-08-26 ENCOUNTER — Other Ambulatory Visit: Payer: Self-pay | Admitting: Family Medicine

## 2014-09-16 ENCOUNTER — Telehealth: Payer: Self-pay | Admitting: *Deleted

## 2014-09-16 DIAGNOSIS — E039 Hypothyroidism, unspecified: Secondary | ICD-10-CM

## 2014-09-16 DIAGNOSIS — E785 Hyperlipidemia, unspecified: Secondary | ICD-10-CM

## 2014-09-16 NOTE — Telephone Encounter (Signed)
Aylana has an appt with you on Wednesday and is asking to have her routine bloodwork done tomorrow. What would you like to order? Please advise. Margette Fast, CMA

## 2014-09-16 NOTE — Telephone Encounter (Signed)
TSH, CMP and lipids

## 2014-09-17 DIAGNOSIS — E039 Hypothyroidism, unspecified: Secondary | ICD-10-CM | POA: Diagnosis not present

## 2014-09-17 DIAGNOSIS — E785 Hyperlipidemia, unspecified: Secondary | ICD-10-CM | POA: Diagnosis not present

## 2014-09-17 LAB — COMPLETE METABOLIC PANEL WITH GFR
ALBUMIN: 4.6 g/dL (ref 3.5–5.2)
ALT: 38 U/L — AB (ref 0–35)
AST: 61 U/L — AB (ref 0–37)
Alkaline Phosphatase: 120 U/L — ABNORMAL HIGH (ref 39–117)
BUN: 22 mg/dL (ref 6–23)
CALCIUM: 9.6 mg/dL (ref 8.4–10.5)
CO2: 22 meq/L (ref 19–32)
Chloride: 101 mEq/L (ref 96–112)
Creat: 1.09 mg/dL (ref 0.50–1.10)
GFR, EST AFRICAN AMERICAN: 59 mL/min — AB
GFR, EST NON AFRICAN AMERICAN: 52 mL/min — AB
GLUCOSE: 90 mg/dL (ref 70–99)
POTASSIUM: 4.7 meq/L (ref 3.5–5.3)
SODIUM: 138 meq/L (ref 135–145)
Total Bilirubin: 0.5 mg/dL (ref 0.2–1.2)
Total Protein: 7.2 g/dL (ref 6.0–8.3)

## 2014-09-17 LAB — LIPID PANEL
CHOL/HDL RATIO: 5.8 ratio
Cholesterol: 307 mg/dL — ABNORMAL HIGH (ref 0–200)
HDL: 53 mg/dL (ref >39)
LDL CALC: 191 mg/dL — AB (ref 0–99)
TRIGLYCERIDES: 314 mg/dL — AB (ref <150)
VLDL: 63 mg/dL — AB (ref 0–40)

## 2014-09-17 LAB — TSH: TSH: 3.831 u[IU]/mL (ref 0.350–4.500)

## 2014-09-17 NOTE — Telephone Encounter (Signed)
Lab order faxed to [facility] 

## 2014-09-18 ENCOUNTER — Other Ambulatory Visit: Payer: Self-pay | Admitting: Physician Assistant

## 2014-09-18 ENCOUNTER — Other Ambulatory Visit: Payer: Self-pay | Admitting: *Deleted

## 2014-09-18 ENCOUNTER — Ambulatory Visit (INDEPENDENT_AMBULATORY_CARE_PROVIDER_SITE_OTHER): Payer: Medicare Other | Admitting: Family Medicine

## 2014-09-18 ENCOUNTER — Encounter: Payer: Self-pay | Admitting: Family Medicine

## 2014-09-18 VITALS — BP 122/84 | HR 85 | Temp 98.1°F | Ht 66.0 in | Wt 239.0 lb

## 2014-09-18 DIAGNOSIS — R058 Other specified cough: Secondary | ICD-10-CM

## 2014-09-18 DIAGNOSIS — R05 Cough: Secondary | ICD-10-CM | POA: Diagnosis not present

## 2014-09-18 DIAGNOSIS — E785 Hyperlipidemia, unspecified: Secondary | ICD-10-CM

## 2014-09-18 DIAGNOSIS — I10 Essential (primary) hypertension: Secondary | ICD-10-CM

## 2014-09-18 DIAGNOSIS — Z23 Encounter for immunization: Secondary | ICD-10-CM | POA: Diagnosis not present

## 2014-09-18 DIAGNOSIS — E039 Hypothyroidism, unspecified: Secondary | ICD-10-CM

## 2014-09-18 DIAGNOSIS — M2550 Pain in unspecified joint: Secondary | ICD-10-CM

## 2014-09-18 MED ORDER — LEVOTHYROXINE SODIUM 200 MCG PO TABS: ORAL_TABLET | ORAL | Status: DC

## 2014-09-18 MED ORDER — ALPRAZOLAM 0.5 MG PO TABS: ORAL_TABLET | ORAL | Status: DC

## 2014-09-18 MED ORDER — COLESEVELAM HCL 3.75 G PO PACK: 1.0000 | PACK | Freq: Every day | ORAL | Status: DC

## 2014-09-18 MED ORDER — LEVOTHYROXINE SODIUM 25 MCG PO TABS: ORAL_TABLET | ORAL | Status: DC

## 2014-09-18 MED ORDER — CELECOXIB 200 MG PO CAPS: 200.0000 mg | ORAL_CAPSULE | Freq: Every day | ORAL | Status: DC
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
## 2014-09-18 NOTE — Addendum Note (Signed)
Addended by: Teddy Spike on: 09/18/2014 06:29 PM   Modules accepted: Orders

## 2014-10-16 ENCOUNTER — Other Ambulatory Visit: Payer: Self-pay | Admitting: Family Medicine

## 2014-10-17 DIAGNOSIS — B009 Herpesviral infection, unspecified: Secondary | ICD-10-CM | POA: Diagnosis not present

## 2014-10-17 DIAGNOSIS — D485 Neoplasm of uncertain behavior of skin: Secondary | ICD-10-CM | POA: Diagnosis not present

## 2014-10-24 ENCOUNTER — Other Ambulatory Visit: Payer: Self-pay | Admitting: *Deleted

## 2014-10-24 MED ORDER — LANSOPRAZOLE 30 MG PO CPDR: 30.0000 mg | DELAYED_RELEASE_CAPSULE | Freq: Every day | ORAL | Status: DC

## 2014-10-24 NOTE — Telephone Encounter (Signed)
Pt came in with her husband and stated that at her last OV there was a mix up with where the scripts were to be sent. She wrote on paper the scripts that are to go to the local pharmacy and the ones that go to mail order. She is asking that I send only the lansoprazole to her mail order today and that I place a copy of this in her records for future reference. I told her that I would. Placed in scan folder.Audelia Hives [place]

## 2014-11-14 ENCOUNTER — Other Ambulatory Visit: Payer: Self-pay | Admitting: Family Medicine

## 2014-11-18 ENCOUNTER — Other Ambulatory Visit: Payer: Self-pay | Admitting: Family Medicine

## 2014-12-12 ENCOUNTER — Other Ambulatory Visit: Payer: Self-pay | Admitting: Family Medicine

## 2014-12-16 ENCOUNTER — Other Ambulatory Visit: Payer: Self-pay | Admitting: Family Medicine

## 2015-01-14 ENCOUNTER — Encounter: Payer: Self-pay | Admitting: Family Medicine

## 2015-01-14 ENCOUNTER — Ambulatory Visit (INDEPENDENT_AMBULATORY_CARE_PROVIDER_SITE_OTHER): Payer: Medicare Other | Admitting: Family Medicine

## 2015-01-14 VITALS — BP 128/82 | HR 91 | Wt 237.0 lb

## 2015-01-14 DIAGNOSIS — M7989 Other specified soft tissue disorders: Secondary | ICD-10-CM

## 2015-01-14 DIAGNOSIS — M542 Cervicalgia: Secondary | ICD-10-CM

## 2015-01-14 DIAGNOSIS — E039 Hypothyroidism, unspecified: Secondary | ICD-10-CM | POA: Diagnosis not present

## 2015-01-14 DIAGNOSIS — M2550 Pain in unspecified joint: Secondary | ICD-10-CM

## 2015-01-14 DIAGNOSIS — J019 Acute sinusitis, unspecified: Secondary | ICD-10-CM | POA: Diagnosis not present

## 2015-01-14 MED ORDER — AZITHROMYCIN 250 MG PO TABS: ORAL_TABLET | ORAL | Status: DC
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
## 2015-01-14 NOTE — Patient Instructions (Signed)

## 2015-01-15 LAB — COMPLETE METABOLIC PANEL WITH GFR
ALBUMIN: 4.5 g/dL (ref 3.5–5.2)
ALT: 36 U/L — AB (ref 0–35)
AST: 49 U/L — AB (ref 0–37)
Alkaline Phosphatase: 134 U/L — ABNORMAL HIGH (ref 39–117)
BUN: 24 mg/dL — ABNORMAL HIGH (ref 6–23)
CALCIUM: 10 mg/dL (ref 8.4–10.5)
CO2: 24 mEq/L (ref 19–32)
Chloride: 104 mEq/L (ref 96–112)
Creat: 1.02 mg/dL (ref 0.50–1.10)
GFR, Est African American: 64 mL/min
GFR, Est Non African American: 55 mL/min — ABNORMAL LOW
Glucose, Bld: 94 mg/dL (ref 70–99)
POTASSIUM: 4.3 meq/L (ref 3.5–5.3)
Sodium: 143 mEq/L (ref 135–145)
Total Bilirubin: 0.4 mg/dL (ref 0.2–1.2)
Total Protein: 7 g/dL (ref 6.0–8.3)

## 2015-01-15 LAB — URIC ACID: Uric Acid, Serum: 7.7 mg/dL — ABNORMAL HIGH (ref 2.4–7.0)

## 2015-01-15 LAB — TSH: TSH: 2.135 u[IU]/mL (ref 0.350–4.500)

## 2015-01-15 LAB — SEDIMENTATION RATE: Sed Rate: 21 mm/hr (ref 0–30)

## 2015-01-16 ENCOUNTER — Other Ambulatory Visit: Payer: Self-pay | Admitting: *Deleted

## 2015-01-16 DIAGNOSIS — M542 Cervicalgia: Secondary | ICD-10-CM

## 2015-01-17 ENCOUNTER — Telehealth: Payer: Self-pay

## 2015-01-17 MED ORDER — CEFUROXIME AXETIL 250 MG PO TABS: 250.0000 mg | ORAL_TABLET | Freq: Two times a day (BID) | ORAL | Status: DC

## 2015-01-17 NOTE — Telephone Encounter (Signed)
Patient advised.

## 2015-01-17 NOTE — Telephone Encounter (Signed)
Nicole Norton called and reports feeling worse; Body aches, fever, chills, runny nose and cough. She wants a different antibiotic.

## 2015-01-17 NOTE — Telephone Encounter (Signed)
Ok, new abx sent.

## 2015-01-22 ENCOUNTER — Ambulatory Visit (INDEPENDENT_AMBULATORY_CARE_PROVIDER_SITE_OTHER): Payer: Medicare Other | Admitting: Physical Therapy

## 2015-01-22 DIAGNOSIS — R293 Abnormal posture: Secondary | ICD-10-CM

## 2015-01-22 DIAGNOSIS — M542 Cervicalgia: Secondary | ICD-10-CM | POA: Diagnosis present

## 2015-01-22 DIAGNOSIS — R29898 Other symptoms and signs involving the musculoskeletal system: Secondary | ICD-10-CM
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
## 2015-01-22 NOTE — Patient Instructions (Signed)
Provided pt with upper trap stretch; levator stretch; post shoulder rolls and scapular retraction.

## 2015-01-24 ENCOUNTER — Ambulatory Visit (INDEPENDENT_AMBULATORY_CARE_PROVIDER_SITE_OTHER): Payer: Medicare Other | Admitting: Physical Therapy

## 2015-01-24 DIAGNOSIS — M542 Cervicalgia: Secondary | ICD-10-CM

## 2015-01-24 DIAGNOSIS — R293 Abnormal posture: Secondary | ICD-10-CM
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
## 2015-01-28 ENCOUNTER — Ambulatory Visit (INDEPENDENT_AMBULATORY_CARE_PROVIDER_SITE_OTHER): Payer: Medicare Other | Admitting: Physical Therapy

## 2015-01-28 DIAGNOSIS — M542 Cervicalgia: Secondary | ICD-10-CM

## 2015-01-28 DIAGNOSIS — R293 Abnormal posture: Secondary | ICD-10-CM

## 2015-01-28 DIAGNOSIS — R29898 Other symptoms and signs involving the musculoskeletal system: Secondary | ICD-10-CM
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
## 2015-01-28 NOTE — Patient Instructions (Signed)
Pt issued scapular theraband: unattached exercise.  Pt was to perform hor abd and diagonals with red band. 2 sets of 10, 1x/day.

## 2015-01-31 ENCOUNTER — Encounter: Payer: Self-pay | Admitting: Physical Therapy

## 2015-02-03 ENCOUNTER — Ambulatory Visit (INDEPENDENT_AMBULATORY_CARE_PROVIDER_SITE_OTHER): Payer: Medicare Other | Admitting: Physical Therapy

## 2015-02-03 DIAGNOSIS — M542 Cervicalgia: Secondary | ICD-10-CM

## 2015-02-03 DIAGNOSIS — R29898 Other symptoms and signs involving the musculoskeletal system: Secondary | ICD-10-CM | POA: Diagnosis not present
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
## 2015-02-06 ENCOUNTER — Encounter: Payer: Self-pay | Admitting: Physical Therapy

## 2015-02-11 ENCOUNTER — Encounter: Payer: Self-pay | Admitting: Family Medicine

## 2015-02-11 ENCOUNTER — Other Ambulatory Visit: Payer: Self-pay | Admitting: Family Medicine

## 2015-02-11 ENCOUNTER — Encounter: Payer: Self-pay | Admitting: Physical Therapy

## 2015-02-11 ENCOUNTER — Ambulatory Visit (INDEPENDENT_AMBULATORY_CARE_PROVIDER_SITE_OTHER): Payer: Medicare Other | Admitting: Family Medicine

## 2015-02-11 VITALS — BP 132/90 | Temp 97.9°F | Wt 235.0 lb

## 2015-02-11 DIAGNOSIS — M791 Myalgia, unspecified site: Secondary | ICD-10-CM

## 2015-02-11 DIAGNOSIS — K219 Gastro-esophageal reflux disease without esophagitis: Secondary | ICD-10-CM | POA: Insufficient documentation

## 2015-02-11 DIAGNOSIS — M2550 Pain in unspecified joint: Secondary | ICD-10-CM | POA: Diagnosis not present

## 2015-02-11 DIAGNOSIS — F418 Other specified anxiety disorders: Secondary | ICD-10-CM | POA: Diagnosis not present

## 2015-02-11 MED ORDER — SUCRALFATE 1 GM/10ML PO SUSP: 1.0000 g | Freq: Three times a day (TID) | ORAL | Status: DC

## 2015-02-11 MED ORDER — CELECOXIB 200 MG PO CAPS: 200.0000 mg | ORAL_CAPSULE | Freq: Every day | ORAL | Status: DC

## 2015-02-11 MED ORDER — ALPRAZOLAM 0.5 MG PO TABS: 0.5000 mg | ORAL_TABLET | Freq: Every day | ORAL | Status: DC

## 2015-02-11 NOTE — Progress Notes (Signed)
Subjective:    Patient ID: Nicole Norton, female    DOB: 11/07/1941, 74 y.o.   MRN: [id number]  HPI Has been to 4 PT sessions for neck.  Says her joints were worse for 2 days after PT. Says she hurts "all over" she feels like she has to stay in the bed for 2 days ot feel better.  Feels like her joints are on fire. Her back, knees, shoulder, hips, feet, etc. Has taken some oxycodone 5 mg rarely.  Decided to stop PT as has been worse.  She is on celebrex. Denies any sig swelling.  Says pain is radiating into her upper arms and thighs.  Had 2 high fevers in the last month that were around 102.  No URI sxs with it. Each time lasted only 24 hours.  Feels like this started in November and has been gradually getting worse. Feels  A little stiff in the AM for an hour of two before feels "lubricated".   Mother had a lot of pain at the end of the life. Noticed some nodules in the distal fingers.    GERD- requests rx for carafate. She is on a PPI daily and says occ still has breakthrough sxs. She occ uses her husband's carafate and says it works really well. Would like a rx for tht  Anxiety- would also like her xanax to be written for 90 days supply.    Review of Systems     Objective:   Physical Exam  Constitutional: She is oriented to person, place, and time. She appears well-developed and well-nourished.  HENT:  Head: Normocephalic and atraumatic.  Cardiovascular: Normal rate, regular rhythm and normal heart sounds.   Pulmonary/Chest: Effort normal and breath sounds normal.  Musculoskeletal:  Neck with normal flexion/extension. Slightly decreased rotation right and left but symmetric. Shoulders with NROM, though some discomfort with full extension. She's noticed that she's had a little bit more difficulty with internal rotation, for example when she tries to her snap her bra strap.  . Internal rotation was fairly symmetric and she was able to reach to her mid back..  Tender over both shoulders.  Nontender over the forearms and upper back. Hips, knees, ankles with 5 out of 5 strength bilaterally. Patellar reflex 1+ bilaterally. No swelling of the joints in the wrists or hands. She does have some nodules over the DIP joint on the first finger on the left hand.  Neurological: She is alert and oriented to person, place, and time.  Skin: Skin is warm and dry.  Psychiatric: She has a normal mood and affect. Her behavior is normal.          Assessment & Plan:  Polyarthritis/myalgia-unclear etiology at this point. She doesn't have quite the typical stiffness with polymyalgia rheumatica but certainly will check sedimentation rate and CRP and CK. She really is not on any new medications that should be causing some pain. I did refill her Celebrex today. I did not give her any perception for narcotic pain medication at this time. We'll also refer her to Dr. Tobie Lords who also sees her husband for rheumatology. Also consider this could be fibromyalgia. But certainly we need to exclude other possibilities first.  GERD- Continue daily PPI. Avoid triggers like tomatoes, etc.  I did give her a rx for the carafate to use PRN. Can also inc PPI to BID for couple of weeks of needed to get better control of sxs.   Anxiety - xanax rewritten for  90 days supply.

## 2015-02-12 LAB — COMPLETE METABOLIC PANEL WITH GFR
ALBUMIN: 4.5 g/dL (ref 3.5–5.2)
ALT: 43 U/L — AB (ref 0–35)
AST: 73 U/L — AB (ref 0–37)
Alkaline Phosphatase: 130 U/L — ABNORMAL HIGH (ref 39–117)
BUN: 28 mg/dL — ABNORMAL HIGH (ref 6–23)
CALCIUM: 10.3 mg/dL (ref 8.4–10.5)
CO2: 23 mEq/L (ref 19–32)
CREATININE: 0.96 mg/dL (ref 0.50–1.10)
Chloride: 105 mEq/L (ref 96–112)
GFR, EST NON AFRICAN AMERICAN: 60 mL/min
GFR, Est African American: 69 mL/min
Glucose, Bld: 101 mg/dL — ABNORMAL HIGH (ref 70–99)
POTASSIUM: 4.6 meq/L (ref 3.5–5.3)
Sodium: 142 mEq/L (ref 135–145)
TOTAL PROTEIN: 7.3 g/dL (ref 6.0–8.3)
Total Bilirubin: 0.4 mg/dL (ref 0.2–1.2)

## 2015-02-12 LAB — CBC WITH DIFFERENTIAL/PLATELET
Basophils Absolute: 0.1 10*3/uL (ref 0.0–0.1)
Basophils Relative: 1 % (ref 0–1)
Eosinophils Absolute: 0.4 10*3/uL (ref 0.0–0.7)
Eosinophils Relative: 4 % (ref 0–5)
HCT: 39.7 % (ref 36.0–46.0)
Hemoglobin: 13.3 g/dL (ref 12.0–15.0)
Lymphocytes Relative: 30 % (ref 12–46)
Lymphs Abs: 2.9 10*3/uL (ref 0.7–4.0)
MCH: 31.9 pg (ref 26.0–34.0)
MCHC: 33.5 g/dL (ref 30.0–36.0)
MCV: 95.2 fL (ref 78.0–100.0)
MONOS PCT: 7 % (ref 3–12)
MPV: 10.5 fL (ref 8.6–12.4)
Monocytes Absolute: 0.7 10*3/uL (ref 0.1–1.0)
Neutro Abs: 5.5 10*3/uL (ref 1.7–7.7)
Neutrophils Relative %: 58 % (ref 43–77)
Platelets: 316 10*3/uL (ref 150–400)
RBC: 4.17 MIL/uL (ref 3.87–5.11)
RDW: 13.5 % (ref 11.5–15.5)
WBC: 9.5 10*3/uL (ref 4.0–10.5)

## 2015-02-12 LAB — CYCLIC CITRUL PEPTIDE ANTIBODY, IGG

## 2015-02-12 LAB — RHEUMATOID FACTOR

## 2015-02-12 LAB — ANA: Anti Nuclear Antibody(ANA): NEGATIVE

## 2015-02-12 LAB — SEDIMENTATION RATE: Sed Rate: 38 mm/hr — ABNORMAL HIGH (ref 0–30)

## 2015-02-12 LAB — CK: Total CK: 21 U/L (ref 7–177)

## 2015-02-12 LAB — C-REACTIVE PROTEIN: CRP: 2.9 mg/dL — AB (ref <0.60)

## 2015-02-12 LAB — VITAMIN D 25 HYDROXY (VIT D DEFICIENCY, FRACTURES): Vit D, 25-Hydroxy: 30 ng/mL (ref 30–100)

## 2015-02-13 LAB — GAMMA GT: GGT: 99 U/L — ABNORMAL HIGH (ref 7–51)

## 2015-02-14 LAB — IRON AND TIBC
%SAT: 14 % — AB (ref 20–55)
IRON: 63 ug/dL (ref 42–145)
TIBC: 438 ug/dL (ref 250–470)
UIBC: 375 ug/dL (ref 125–400)

## 2015-03-06 DIAGNOSIS — R7989 Other specified abnormal findings of blood chemistry: Secondary | ICD-10-CM | POA: Diagnosis not present

## 2015-03-06 DIAGNOSIS — R5383 Other fatigue: Secondary | ICD-10-CM | POA: Diagnosis not present

## 2015-03-06 DIAGNOSIS — M79673 Pain in unspecified foot: Secondary | ICD-10-CM | POA: Diagnosis not present

## 2015-03-06 DIAGNOSIS — M2550 Pain in unspecified joint: Secondary | ICD-10-CM | POA: Diagnosis not present

## 2015-03-06 DIAGNOSIS — M19072 Primary osteoarthritis, left ankle and foot: Secondary | ICD-10-CM | POA: Diagnosis not present

## 2015-03-13 ENCOUNTER — Other Ambulatory Visit: Payer: Self-pay | Admitting: Family Medicine

## 2015-03-20 DIAGNOSIS — R7989 Other specified abnormal findings of blood chemistry: Secondary | ICD-10-CM | POA: Diagnosis not present

## 2015-03-20 DIAGNOSIS — M2550 Pain in unspecified joint: Secondary | ICD-10-CM | POA: Diagnosis not present

## 2015-03-20 DIAGNOSIS — R5383 Other fatigue: Secondary | ICD-10-CM | POA: Diagnosis not present

## 2015-03-20 DIAGNOSIS — M79673 Pain in unspecified foot: Secondary | ICD-10-CM | POA: Diagnosis not present

## 2015-03-24 ENCOUNTER — Other Ambulatory Visit: Payer: Self-pay

## 2015-03-24 DIAGNOSIS — Z1231 Encounter for screening mammogram for malignant neoplasm of breast: Secondary | ICD-10-CM

## 2015-04-17 ENCOUNTER — Ambulatory Visit
Admission: RE | Admit: 2015-04-17 | Discharge: 2015-04-17 | Disposition: A | Discharge status: Home / Self Care | Payer: Medicare Other | Source: Ambulatory Visit

## 2015-04-17 DIAGNOSIS — Z1231 Encounter for screening mammogram for malignant neoplasm of breast: Secondary | ICD-10-CM

## 2015-05-06 DIAGNOSIS — H43813 Vitreous degeneration, bilateral: Secondary | ICD-10-CM | POA: Diagnosis not present

## 2015-05-12 ENCOUNTER — Other Ambulatory Visit: Payer: Self-pay

## 2015-05-12 ENCOUNTER — Other Ambulatory Visit: Payer: Self-pay | Admitting: Family Medicine

## 2015-05-22 ENCOUNTER — Emergency Department (INDEPENDENT_AMBULATORY_CARE_PROVIDER_SITE_OTHER)
Admission: EM | Admit: 2015-05-22 | Discharge: 2015-05-22 | Disposition: A | Discharge status: Home / Self Care | Payer: Medicare Other | Source: Home / Self Care | Attending: Family Medicine | Admitting: Family Medicine

## 2015-05-22 ENCOUNTER — Emergency Department (INDEPENDENT_AMBULATORY_CARE_PROVIDER_SITE_OTHER): Payer: Medicare Other

## 2015-05-22 ENCOUNTER — Encounter: Payer: Self-pay | Admitting: *Deleted

## 2015-05-22 DIAGNOSIS — M79672 Pain in left foot: Secondary | ICD-10-CM

## 2015-05-22 DIAGNOSIS — M25475 Effusion, left foot: Secondary | ICD-10-CM

## 2015-05-22 DIAGNOSIS — M79605 Pain in left leg: Secondary | ICD-10-CM | POA: Diagnosis not present

## 2015-05-22 DIAGNOSIS — Z872 Personal history of diseases of the skin and subcutaneous tissue: Secondary | ICD-10-CM

## 2015-05-22 DIAGNOSIS — M7989 Other specified soft tissue disorders: Secondary | ICD-10-CM | POA: Diagnosis not present

## 2015-05-22 DIAGNOSIS — M79662 Pain in left lower leg: Secondary | ICD-10-CM

## 2015-05-22 DIAGNOSIS — Z8739 Personal history of other diseases of the musculoskeletal system and connective tissue: Secondary | ICD-10-CM

## 2015-05-22 MED ORDER — PREDNISONE 20 MG PO TABS: ORAL_TABLET | ORAL | Status: DC
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
## 2015-05-22 NOTE — ED Notes (Signed)
Pt reports left foot pain x 5 days without injury. She has h/o psoriatic arthritis but denies previous injury. She did a lot of standing in bare feet 5 days ago. Swelling present.

## 2015-05-22 NOTE — Discharge Instructions (Signed)
Please take medication as prescribed. Discussed this medication with your PCP on Monday as asked for referral to rheumatologist for further treatment of recurrent episodes of swelling and pain in feet. If you develop worsening symptoms including severe pain, worsening swelling, numbness or tingling in your legs or feet, and/or develop chest pain or shortness of breath, please call 911 or go to closest emergency department. The below for further instructions.

## 2015-06-09 ENCOUNTER — Encounter: Payer: Self-pay | Admitting: Family Medicine

## 2015-06-09 ENCOUNTER — Ambulatory Visit (INDEPENDENT_AMBULATORY_CARE_PROVIDER_SITE_OTHER): Payer: Medicare Other | Admitting: Family Medicine

## 2015-06-09 VITALS — BP 130/88 | HR 74 | Wt 241.0 lb

## 2015-06-09 DIAGNOSIS — M2550 Pain in unspecified joint: Secondary | ICD-10-CM

## 2015-06-09 DIAGNOSIS — L4050 Arthropathic psoriasis, unspecified: Secondary | ICD-10-CM

## 2015-06-09 DIAGNOSIS — M25561 Pain in right knee: Secondary | ICD-10-CM

## 2015-06-09 MED ORDER — PREDNISONE 20 MG PO TABS: ORAL_TABLET | ORAL | Status: DC

## 2015-06-09 NOTE — Progress Notes (Signed)
   Subjective:    Patient ID: Nicole Norton, female    DOB: [birth year], 74 y.o.   MRN: [id number]  HPI Patient had complained of multiple joint and muscle aches and I have referred her to rheumatology. She did see Dr. Tobie Lords. He put her on a perception of etodolac which does seem to help but she feels like she's had 3 flares since then. She had to stop it bc of S.E. Says it worsened her HA and made her heart flutters more frequent.  Unfortunately he has left the practice and she will need to establish with a new rheumatologist.  She was diagnosed with psoriatic arthritis.  Went ot UC about 3 weeks ago for left swollen foot and pain. Was put on prednisone and says it really helped as well. She felt like within hours she excised started to get a good pain relief response to the prednisone. Now having left knee pain that is sharp.  Says prednisone and heat seem to help. She would really like to have a new prescription she is going out of town next week to an [facility] and once we have her walk around in her right knee has been bothering her this week.   Review of Systems     Objective:   Physical Exam  Constitutional: She appears well-developed and well-nourished.  HENT:  Head: Normocephalic and atraumatic.  Musculoskeletal: She exhibits edema.  Right knee is slightly increased warmth compared to the left knee. No significant crepitus behind the patella. No excessive motion patella. Nontender along the joint lines or around the patella. No laxity. She does have some trace swelling around the right ankle and foot.  Skin: Skin is warm and dry. No rash noted.  Psychiatric: She has a normal mood and affect. Her behavior is normal.          Assessment & Plan:  Polyarthralgia-most likely psoriatic arthritis as discussed by recent rheumatology consult. Since her rheumatologist has left the practice she would like to find a new one. We'll see if we can get her in with Dr. Patrecia Pour in  [place]. I did go ahead and give her a short course of prednisone. But we discussed the long-term effects of prednisone and a short-term side effects as well. She wants to hold off on all NSAIDs. She had been on them chronically for several years and fell at the actually made her feel worse. The most important thing would be to get in and confirm her diagnosis and then discuss drugs that are appropriate for this autoimmune disorder.

## 2015-06-17 ENCOUNTER — Ambulatory Visit: Payer: Self-pay | Admitting: Family Medicine

## 2015-06-30 ENCOUNTER — Other Ambulatory Visit: Payer: Self-pay | Admitting: Family Medicine

## 2015-07-25 DIAGNOSIS — H26493 Other secondary cataract, bilateral: Secondary | ICD-10-CM | POA: Diagnosis not present

## 2015-07-25 DIAGNOSIS — Z961 Presence of intraocular lens: Secondary | ICD-10-CM | POA: Diagnosis not present

## 2015-07-25 DIAGNOSIS — H527 Unspecified disorder of refraction: Secondary | ICD-10-CM | POA: Diagnosis not present

## 2015-09-01 DIAGNOSIS — M25561 Pain in right knee: Secondary | ICD-10-CM | POA: Diagnosis not present

## 2015-09-01 DIAGNOSIS — M79642 Pain in left hand: Secondary | ICD-10-CM | POA: Diagnosis not present

## 2015-09-01 DIAGNOSIS — M2550 Pain in unspecified joint: Secondary | ICD-10-CM | POA: Diagnosis not present

## 2015-09-01 DIAGNOSIS — M79671 Pain in right foot: Secondary | ICD-10-CM | POA: Diagnosis not present

## 2015-09-01 DIAGNOSIS — Z79899 Other long term (current) drug therapy: Secondary | ICD-10-CM | POA: Diagnosis not present

## 2015-09-01 DIAGNOSIS — M79672 Pain in left foot: Secondary | ICD-10-CM | POA: Diagnosis not present

## 2015-09-01 DIAGNOSIS — M25562 Pain in left knee: Secondary | ICD-10-CM | POA: Diagnosis not present

## 2015-09-01 DIAGNOSIS — M25551 Pain in right hip: Secondary | ICD-10-CM | POA: Diagnosis not present

## 2015-09-01 DIAGNOSIS — M79641 Pain in right hand: Secondary | ICD-10-CM | POA: Diagnosis not present

## 2015-09-01 LAB — BASIC METABOLIC PANEL
BUN: 108 mg/dL — AB (ref 4–21)
BUN: 28 mg/dL — AB (ref 4–21)
CREATININE: 1.3 mg/dL — AB (ref 0.5–1.1)
Creatinine: 1.3 mg/dL — AB (ref 0.5–1.1)
Glucose: 108 mg/dL
Potassium: 4.5 mmol/L (ref 3.4–5.3)
Potassium: 4.5 mmol/L (ref 3.4–5.3)
SODIUM: 143 mmol/L (ref 137–147)
Sodium: 143 mmol/L (ref 137–147)

## 2015-09-01 LAB — HEPATIC FUNCTION PANEL
ALK PHOS: 98 U/L (ref 25–125)
ALT: 26 U/L (ref 7–35)
AST: 31 U/L (ref 13–35)
Bilirubin, Total: 0.4 mg/dL

## 2015-09-01 LAB — CBC AND DIFFERENTIAL
HEMATOCRIT: 40 % (ref 36–46)
Hemoglobin: 14 g/dL (ref 12.0–16.0)
PLATELETS: 332 10*3/uL (ref 150–399)
WBC: 9.1 10^3/mL

## 2015-09-01 LAB — POCT ERYTHROCYTE SEDIMENTATION RATE, NON-AUTOMATED: Sed Rate: 17 mm

## 2015-09-02 LAB — ESTIMATED GFR
CCP IgG Antibodies: <16
EGFR (Non-African Amer.): 41
Rheumatoid Fact: <10
URIC ACID: 9.3

## 2015-09-08 ENCOUNTER — Other Ambulatory Visit: Payer: Self-pay | Admitting: Family Medicine

## 2015-09-09 ENCOUNTER — Other Ambulatory Visit: Payer: Self-pay | Admitting: Family Medicine

## 2015-09-10 DIAGNOSIS — M1A19X0 Lead-induced chronic gout, multiple sites, without tophus (tophi): Secondary | ICD-10-CM | POA: Diagnosis not present

## 2015-09-10 DIAGNOSIS — M1711 Unilateral primary osteoarthritis, right knee: Secondary | ICD-10-CM | POA: Diagnosis not present

## 2015-09-10 DIAGNOSIS — M19271 Secondary osteoarthritis, right ankle and foot: Secondary | ICD-10-CM | POA: Diagnosis not present

## 2015-09-10 DIAGNOSIS — M19041 Primary osteoarthritis, right hand: Secondary | ICD-10-CM | POA: Diagnosis not present

## 2015-09-10 DIAGNOSIS — M19272 Secondary osteoarthritis, left ankle and foot: Secondary | ICD-10-CM | POA: Diagnosis not present

## 2015-09-16 ENCOUNTER — Encounter: Payer: Self-pay | Admitting: Family Medicine

## 2015-09-16 ENCOUNTER — Ambulatory Visit (INDEPENDENT_AMBULATORY_CARE_PROVIDER_SITE_OTHER): Payer: Medicare Other | Admitting: Family Medicine

## 2015-09-16 VITALS — BP 119/61 | HR 85 | Ht 66.0 in | Wt 242.0 lb

## 2015-09-16 DIAGNOSIS — I10 Essential (primary) hypertension: Secondary | ICD-10-CM | POA: Diagnosis not present

## 2015-09-16 DIAGNOSIS — M791 Myalgia: Secondary | ICD-10-CM | POA: Diagnosis not present

## 2015-09-16 DIAGNOSIS — R7989 Other specified abnormal findings of blood chemistry: Secondary | ICD-10-CM

## 2015-09-16 DIAGNOSIS — R748 Abnormal levels of other serum enzymes: Secondary | ICD-10-CM | POA: Diagnosis not present

## 2015-09-16 DIAGNOSIS — M7918 Myalgia, other site: Secondary | ICD-10-CM

## 2015-09-16 DIAGNOSIS — M109 Gout, unspecified: Secondary | ICD-10-CM | POA: Insufficient documentation

## 2015-09-16 DIAGNOSIS — G8929 Other chronic pain: Secondary | ICD-10-CM

## 2015-09-16 MED ORDER — OLMESARTAN MEDOXOMIL 40 MG PO TABS: 40.0000 mg | ORAL_TABLET | Freq: Every day | ORAL | Status: DC

## 2015-09-16 MED ORDER — DULOXETINE HCL 30 MG PO CPEP: 30.0000 mg | ORAL_CAPSULE | Freq: Every day | ORAL | Status: DC

## 2015-09-16 MED ORDER — AMLODIPINE BESYLATE 10 MG PO TABS: 10.0000 mg | ORAL_TABLET | Freq: Every day | ORAL | Status: DC

## 2015-09-16 NOTE — Progress Notes (Signed)
   Subjective:    Patient ID: Nicole Norton, female    DOB: Mar 19, 1941, 74 y.o.   MRN: [id number]  HPI She was recently diagnosed with gout and started on allopurinol and colchicine. We will need to d/c her HCTZ.  She recommended Cymbalta.   Anxiety- she is down to 0.25mg  of xanax a day.  She is really been trying to wean herself and get off of it completely. She is aware of the risks of chronic and long-term use.    Hypertension- Pt denies chest pain, SOB, dizziness, or heart palpitations.  Taking meds as directed w/o problems.  Denies medication side effects.   BPs runnning 130/80  Her kidney function was up to 1.3. She was fasting that day when had it checked.    Got her flu shot last Thursday.    Review of Systems     Objective:   Physical Exam  Constitutional: She is oriented to person, place, and time. She appears well-developed and well-nourished.  HENT:  Head: Normocephalic and atraumatic.  Cardiovascular: Normal rate, regular rhythm and normal heart sounds.   Pulmonary/Chest: Effort normal and breath sounds normal.  Neurological: She is alert and oriented to person, place, and time.  Skin: Skin is warm and dry.  Psychiatric: She has a normal mood and affect. Her behavior is normal.          Assessment & Plan:  Hypertension-has overall been well controlled. Will discontinue hydrochlorothiazide. And split out the Benicar and amlodipine. 2 new perception sent to pharmacy. Follow up in 6 weeks recheck blood pressure. Next  Elevated renal function-we'll recheck that again in follow-up in 6 weeks just to make sure it went down to normal.  Muscle skeletal pain. Condition of her rheumatologist will go ahead and start Cymbalta. We'll start a 30 mg daily and see her back in 6 weeks and adjust dose if needed. Discontinue alprazolam.  Gout-we will get some relief on her new regimen with the colchicine and allopurinol. That she is getting some nausea and loose stools with the  colchicine. Encourage her to call if sxs worsen. She says if she takes it at bedtime it reduces the nausea.

## 2015-09-16 NOTE — Patient Instructions (Signed)
Tribenzor discontinued. Omelsartan and amlodipine separate prescription sent to the pharmacy in its place. Alprazolam discontinued. Cymbalta 30 mg, take once daily, since the pharmacy.

## 2015-09-18 ENCOUNTER — Encounter: Payer: Self-pay | Admitting: Emergency Medicine

## 2015-10-28 ENCOUNTER — Encounter: Payer: Self-pay | Admitting: Family Medicine

## 2015-10-28 ENCOUNTER — Ambulatory Visit (INDEPENDENT_AMBULATORY_CARE_PROVIDER_SITE_OTHER): Payer: Medicare Other | Admitting: Family Medicine

## 2015-10-28 VITALS — BP 159/89 | HR 92 | Wt 237.0 lb

## 2015-10-28 DIAGNOSIS — M109 Gout, unspecified: Secondary | ICD-10-CM | POA: Diagnosis not present

## 2015-10-28 DIAGNOSIS — M2550 Pain in unspecified joint: Secondary | ICD-10-CM

## 2015-10-28 DIAGNOSIS — G4700 Insomnia, unspecified: Secondary | ICD-10-CM

## 2015-10-28 DIAGNOSIS — Z23 Encounter for immunization: Secondary | ICD-10-CM | POA: Diagnosis not present

## 2015-10-28 MED ORDER — SUCRALFATE 1 GM/10ML PO SUSP: ORAL | Status: DC

## 2015-10-28 MED ORDER — PREDNISONE 10 MG PO TABS
10.0000 mg | ORAL_TABLET | Freq: Every day | ORAL | Status: DC
Start: 2015-10-28 — End: 2015-10-28

## 2015-10-28 MED ORDER — ALPRAZOLAM 0.5 MG PO TABS: 0.5000 mg | ORAL_TABLET | Freq: Every day | ORAL | Status: DC

## 2015-10-28 MED ORDER — LANSOPRAZOLE 30 MG PO CPDR: 30.0000 mg | DELAYED_RELEASE_CAPSULE | Freq: Every day | ORAL | Status: DC

## 2015-10-28 MED ORDER — PREDNISONE 10 MG PO TABS: 10.0000 mg | ORAL_TABLET | Freq: Every day | ORAL | Status: DC

## 2015-10-28 NOTE — Patient Instructions (Addendum)
Try the melatonin for sleep. Can try a 4mg  dose. Can go up to 8 mg if need.  May need to take an hour or two before bedtime.   See what Dr. Bronson Curb thinks about Probenicid.

## 2015-10-28 NOTE — Progress Notes (Signed)
   Subjective:    Patient ID: Nicole Norton, female    DOB: 12/28/1940, 74 y.o.   MRN: [id number]  HPI Joint pain/gout - Needs referral to Dr. Arturo Morton for pain management. Says the cholchicine and cymbalta she was getting palpitations.  Says she even tried to restart the cymbalta and the fluttering started back with in 48 hours. She would like to restart her xanax. She would like to have 20 tabs of prednisone to get her through until her appt at the end of Jan with pain management.   Insomnia - wants to restart the xanax.  Says she is still not sleeping well. Say now that her pain levels are higher she is not sleeping well at all.  She did try over-the-counter Benadryl but made her very groggy the next day. She did try some melatonin years ago but only tried it for one night and felt a little groggy the next day.  GERD-she does need a refill on her Carafate and her Prevacid. It has been controlling her symptoms fairly well.   Review of Systems     Objective:   Physical Exam  Constitutional: She is oriented to person, place, and time. She appears well-developed and well-nourished.  HENT:  Head: Normocephalic and atraumatic.  Eyes: Conjunctivae and EOM are normal.  Cardiovascular: Normal rate.   Pulmonary/Chest: Effort normal.  Neurological: She is alert and oriented to person, place, and time.  Skin: Skin is dry. No pallor.  Psychiatric: She has a normal mood and affect. Her behavior is normal.  Vitals reviewed.         Assessment & Plan:  Polyarthralgia/gout-consider retrial of at least allopurinol by itself. This would be very unusual cause tachycardia. But she said she would be willing to retry it. We'll make referral to pain management. She will keep her appointment with Dr. Patrecia Pour coming up. I did add colchicine and Cymbalta to her intolerance list, since both cause tachycardia.  Insomnia-discussed that she will try the melatonin first. If not assessable consider restarting  the alprazolam. We did warn about potential side effects including increased risk of falls and dementia.  Gad 7 score of 5 today and PHQ 9 of 13. She does  report feeling down several days of the week. Denies any thoughts of wanting to harm herself. Not currently on medication. Unfortunately the Cymbalta she did not tolerate. Could consider trying something else in the future but right now she wants to go for medication.  Pneumovax 23 given today.  Depression screen [facility] 2/9 10/28/2015 09/18/2014 06/19/2013  Decreased Interest 2 0 0  Down, Depressed, Hopeless 2 0 0  PHQ - 2 Score 4 0 0  Altered sleeping 2 - -  Tired, decreased energy 2 - -  Change in appetite 1 - -  Feeling bad or failure about yourself  1 - -  Trouble concentrating 1 - -  Moving slowly or fidgety/restless 2 - -  Suicidal thoughts 0 - -  PHQ-9 Score 13 - -  Difficult doing work/chores Not difficult at all - -

## 2015-11-28 DIAGNOSIS — M2550 Pain in unspecified joint: Secondary | ICD-10-CM | POA: Diagnosis not present

## 2015-11-28 DIAGNOSIS — Z79899 Other long term (current) drug therapy: Secondary | ICD-10-CM | POA: Diagnosis not present

## 2015-12-04 DIAGNOSIS — M19271 Secondary osteoarthritis, right ankle and foot: Secondary | ICD-10-CM | POA: Diagnosis not present

## 2015-12-04 DIAGNOSIS — M1A00X0 Idiopathic chronic gout, unspecified site, without tophus (tophi): Secondary | ICD-10-CM | POA: Diagnosis not present

## 2015-12-04 DIAGNOSIS — M19041 Primary osteoarthritis, right hand: Secondary | ICD-10-CM | POA: Diagnosis not present

## 2015-12-04 DIAGNOSIS — M170 Bilateral primary osteoarthritis of knee: Secondary | ICD-10-CM | POA: Diagnosis not present

## 2015-12-09 ENCOUNTER — Other Ambulatory Visit: Payer: Self-pay | Admitting: Family Medicine

## 2015-12-18 ENCOUNTER — Telehealth: Payer: Self-pay | Admitting: Cardiovascular Disease

## 2015-12-18 NOTE — Telephone Encounter (Signed)
Received records from [place] for appointment on 01/02/16 with Dr [name] Found.  Records given to [facility] (medical records) for Dr Kennon Holter schedule on 01/02/16. lp

## 2015-12-25 DIAGNOSIS — M47812 Spondylosis without myelopathy or radiculopathy, cervical region: Secondary | ICD-10-CM | POA: Diagnosis not present

## 2015-12-25 DIAGNOSIS — M47817 Spondylosis without myelopathy or radiculopathy, lumbosacral region: Secondary | ICD-10-CM | POA: Diagnosis not present

## 2015-12-25 DIAGNOSIS — G894 Chronic pain syndrome: Secondary | ICD-10-CM | POA: Diagnosis not present

## 2015-12-25 DIAGNOSIS — M170 Bilateral primary osteoarthritis of knee: Secondary | ICD-10-CM | POA: Diagnosis not present

## 2015-12-25 DIAGNOSIS — Z79891 Long term (current) use of opiate analgesic: Secondary | ICD-10-CM | POA: Diagnosis not present

## 2016-01-02 ENCOUNTER — Ambulatory Visit (INDEPENDENT_AMBULATORY_CARE_PROVIDER_SITE_OTHER): Payer: Medicare Other | Admitting: Cardiovascular Disease

## 2016-01-02 ENCOUNTER — Encounter: Payer: Self-pay | Admitting: Cardiovascular Disease

## 2016-01-02 VITALS — BP 140/98 | HR 74 | Ht 67.0 in | Wt 237.0 lb

## 2016-01-02 DIAGNOSIS — I10 Essential (primary) hypertension: Secondary | ICD-10-CM | POA: Diagnosis not present

## 2016-01-02 DIAGNOSIS — R002 Palpitations: Secondary | ICD-10-CM

## 2016-01-02 DIAGNOSIS — E785 Hyperlipidemia, unspecified: Secondary | ICD-10-CM | POA: Diagnosis not present

## 2016-01-02 DIAGNOSIS — I499 Cardiac arrhythmia, unspecified: Secondary | ICD-10-CM | POA: Insufficient documentation

## 2016-01-02 NOTE — Progress Notes (Signed)
01/02/2016 Nicole Norton   [birth year]  [id number]  Primary Physician [place], MD Primary Cardiologist: Lorretta Harp MD Renae Gloss   HPI:  Nicole Norton is a 75 year old mildly overweight married Caucasian female with no children who I have taken care of several years ago and have not seen her back since. She is accompanied by her husband Nicole Norton was also a patient of mine. She has a history of hypertension, hyperlipidemia and an arrhythmia which I diagnosed 10-15 years ago. She has never had a heart attack or stroke. She's had multiple stress test off which have been normal as well as 2-D echocardiograms and event monitors. She is statin intolerant. She denies chest pain or shortness of breath. She recently was diagnosed with gout as well as osteoarthritis arthritis. She said rheumatologist and 8 pain doctor. She was placed on gout medicines with resulted in exacerbation of her arrhythmia. Once medicines were discontinued her arrhythmia returned to baseline.   Current Outpatient Prescriptions  Medication Sig Dispense Refill  . amLODipine (NORVASC) 10 MG tablet Take 1 tablet (10 mg total) by mouth daily. 90 tablet 1  . cetirizine (ZYRTEC) 10 MG tablet Take 10 mg by mouth as needed.      . clobetasol cream (TEMOVATE) 0.05 % Apply topically daily as needed. 60 g 0  . lansoprazole (PREVACID) 30 MG capsule Take 1 capsule (30 mg total) by mouth daily. 90 capsule 1  . levothyroxine (SYNTHROID, LEVOTHROID) 200 MCG tablet Take 1 tablet (200 mcg total) by mouth daily. APPOINTMENT NEEDED FOR FURTHER REFILLS 90 tablet 0  . levothyroxine (SYNTHROID, LEVOTHROID) 25 MCG tablet Take 1 tablet (25 mcg total) by mouth daily before breakfast. APPOINTMENT NEEDED FOR FURTHER REFILLS 90 tablet 0  . Misc Natural Products (GLUCOSAMINE CHONDROITIN ADV PO) Take 1 tablet by mouth daily.    . Multiple Vitamins-Minerals (CENTRUM SILVER PO) Take by mouth daily.      [name] olmesartan (BENICAR) 40 MG  tablet Take 1 tablet (40 mg total) by mouth daily. 90 tablet 1  . Omega-3 Fatty Acids (FISH OIL) 1200 MG CAPS Take by mouth daily.      [name] oxyCODONE-acetaminophen (PERCOCET) 7.5-325 MG tablet Take 1 tablet by mouth every 4 (four) hours as needed for severe pain.    . predniSONE (DELTASONE) 10 MG tablet Take 1-2 tablets (10-20 mg total) by mouth daily with breakfast. 20 tablet 0  . PREMARIN 0.625 MG tablet TAKE 1 TABLET DAILY (Patient taking differently: TAKE 1 TABLET twice weekly) 90 tablet 1  . sucralfate (CARAFATE) 1 GM/10ML suspension TAKE 10 MLS BY MOUTH FOUR TIMES A DAY WITH MEALS AND AT BEDTIME 420 mL 0  . vitamin B-12 (CYANOCOBALAMIN) 500 MCG tablet Take 500 mcg by mouth. 3 WEEKLY     No current facility-administered medications for this visit.    Allergies  Allergen Reactions  . Colchicine Other (See Comments)    palpitations  . Cymbalta [Duloxetine Hcl] Other (See Comments)    palpitations  . Etodolac Other (See Comments)    heart flutters and headaches   . Penicillins   . Solu-Medrol [Methylprednisolone Acetate]     Heart palpitations/bad headache  . Statins     REACTION: myalgias    Social History   Social History  . Marital Status: Married    Spouse Name: N/A  . Number of Children: N/A  . Years of Education: N/A   Occupational History  . Not on file.   Social History Main Topics  .  Smoking status: Former Smoker    Quit date: 11/16/1999  . Smokeless tobacco: Not on file  . Alcohol Use: 3.6 oz/week    6 Glasses of wine per week  . Drug Use: No  . Sexual Activity: Not on file   Other Topics Concern  . Not on file   Social History Narrative     Review of Systems: General: negative for chills, fever, night sweats or weight changes.  Cardiovascular: negative for chest pain, dyspnea on exertion, edema, orthopnea, palpitations, paroxysmal nocturnal dyspnea or shortness of breath Dermatological: negative for rash Respiratory: negative for cough or  wheezing Urologic: negative for hematuria Abdominal: negative for nausea, vomiting, diarrhea, bright red blood per rectum, melena, or hematemesis Neurologic: negative for visual changes, syncope, or dizziness All other systems reviewed and are otherwise negative except as noted above.    Blood pressure 140/98, pulse 74, height 5\' 7"  (1.702 m), weight 237 lb (107.502 kg).  General appearance: alert and no distress Neck: no adenopathy, no carotid bruit, no JVD, supple, symmetrical, trachea midline and thyroid not enlarged, symmetric, no tenderness/mass/nodules Lungs: clear to auscultation bilaterally Heart: regular rate and rhythm, S1, S2 normal, no murmur, click, rub or gallop Extremities: extremities normal, atraumatic, no cyanosis or edema  EKG normal sinus rhythm at 74 without ST or T-wave changes.  ASSESSMENT AND PLAN:   Hyperlipidemia LDL goal <160 History of hyperlipidemia intolerant to statin drugs  ESSENTIAL HYPERTENSION, BENIGN History of hypertension with blood pressure measured today at 140/98. She is on amlodipine and Benicar. She says her blood pressure at home usually is much better controlled and suspect this is "white coat hypertension. Continue current meds at current dosing  Cardiac arrhythmia Mrs. Jarboe has had a cardiac arrhythmia for the last 10-15 years. I initially diagnosed this. She was begun on Toprol which resulted in severe bradycardia and therefore she does not tolerate beta blockers. Her arrhythmia have been under good control off of medications until recently when she began taking medicines for gout which resulted in exacerbation of her arrhythmia. When she stopped her medications her arrhythmia has become more tolerable and quiet sent. There are no other associated symptoms. There are no other triggers. She has not had presyncope. I am going to get a total event monitor to further characterize this.      Lorretta Harp MD FACP,FACC,FAHA,  [facility] 01/02/2016 12:19 PM

## 2016-01-02 NOTE — Assessment & Plan Note (Signed)
History of hypertension with blood pressure measured today at 140/98. She is on amlodipine and Benicar. She says her blood pressure at home usually is much better controlled and suspect this is "white coat hypertension. Continue current meds at current dosing

## 2016-01-02 NOTE — Assessment & Plan Note (Signed)
History of hyperlipidemia intolerant to statin drugs 

## 2016-01-02 NOTE — Patient Instructions (Signed)
Medication Instructions:  Your physician recommends that you continue on your current medications as directed. Please refer to the Current Medication list given to you today.   Labwork: none  Testing/Procedures: Your physician has recommended that you wear an event monitor. Event monitors are medical devices that record the heart's electrical activity. Doctors most often [place] these monitors to diagnose arrhythmias. Arrhythmias are problems with the speed or rhythm of the heartbeat. The monitor is a small, portable device. You can wear one while you do your normal daily activities. This is usually used to diagnose what is causing palpitations/syncope (passing out). 2 Week Monitor - schedule at [place]: Your physician wants you to follow-up in: 12 months with Dr. [name] Found. You will receive a reminder letter in the mail two months in advance. If you don't receive a letter, please call our office to schedule the follow-up appointment.   Any Other Special Instructions Will Be Listed Below (If Applicable).     If you need a refill on your cardiac medications before your next appointment, please call your pharmacy.

## 2016-01-02 NOTE — Assessment & Plan Note (Signed)
Nicole Norton has had a cardiac arrhythmia for the last 10-15 years. I initially diagnosed this. She was begun on Toprol which resulted in severe bradycardia and therefore she does not tolerate beta blockers. Her arrhythmia have been under good control off of medications until recently when she began taking medicines for gout which resulted in exacerbation of her arrhythmia. When she stopped her medications her arrhythmia has become more tolerable and quiet sent. There are no other associated symptoms. There are no other triggers. She has not had presyncope. I am going to get a total event monitor to further characterize this.

## 2016-01-06 ENCOUNTER — Encounter (INDEPENDENT_AMBULATORY_CARE_PROVIDER_SITE_OTHER): Payer: Medicare Other

## 2016-01-06 DIAGNOSIS — R002 Palpitations: Secondary | ICD-10-CM

## 2016-01-11 ENCOUNTER — Telehealth: Payer: Self-pay | Admitting: Internal Medicine

## 2016-01-11 NOTE — Telephone Encounter (Signed)
[facility] called and told that patient is in afib with RVR at a rate of 140s and had feeling of fluttering in her chest. She was told to contact the patient and have her come to the ED for management of new onset atrial fibrillation.

## 2016-01-15 DIAGNOSIS — M170 Bilateral primary osteoarthritis of knee: Secondary | ICD-10-CM | POA: Diagnosis not present

## 2016-01-23 DIAGNOSIS — M47817 Spondylosis without myelopathy or radiculopathy, lumbosacral region: Secondary | ICD-10-CM | POA: Diagnosis not present

## 2016-01-23 DIAGNOSIS — M170 Bilateral primary osteoarthritis of knee: Secondary | ICD-10-CM | POA: Diagnosis not present

## 2016-01-23 DIAGNOSIS — Z79891 Long term (current) use of opiate analgesic: Secondary | ICD-10-CM | POA: Diagnosis not present

## 2016-01-23 DIAGNOSIS — G894 Chronic pain syndrome: Secondary | ICD-10-CM | POA: Diagnosis not present

## 2016-01-23 DIAGNOSIS — M47812 Spondylosis without myelopathy or radiculopathy, cervical region: Secondary | ICD-10-CM | POA: Diagnosis not present

## 2016-01-26 ENCOUNTER — Ambulatory Visit: Payer: Self-pay | Admitting: Family Medicine

## 2016-01-26 ENCOUNTER — Telehealth: Payer: Self-pay | Admitting: Cardiovascular Disease

## 2016-01-26 NOTE — Telephone Encounter (Signed)
Pt results reviewed and given next available appt.

## 2016-01-26 NOTE — Telephone Encounter (Signed)
She said she was talking to you and was disconnected.

## 2016-01-30 ENCOUNTER — Encounter: Payer: Self-pay | Admitting: Family Medicine

## 2016-01-30 ENCOUNTER — Ambulatory Visit (INDEPENDENT_AMBULATORY_CARE_PROVIDER_SITE_OTHER): Payer: Medicare Other | Admitting: Family Medicine

## 2016-01-30 VITALS — BP 140/78 | HR 94 | Temp 98.5°F | Wt 236.0 lb

## 2016-01-30 DIAGNOSIS — E039 Hypothyroidism, unspecified: Secondary | ICD-10-CM | POA: Diagnosis not present

## 2016-01-30 DIAGNOSIS — F418 Other specified anxiety disorders: Secondary | ICD-10-CM

## 2016-01-30 DIAGNOSIS — I10 Essential (primary) hypertension: Secondary | ICD-10-CM

## 2016-01-30 DIAGNOSIS — M2550 Pain in unspecified joint: Secondary | ICD-10-CM

## 2016-01-30 DIAGNOSIS — E785 Hyperlipidemia, unspecified: Secondary | ICD-10-CM

## 2016-01-30 NOTE — Progress Notes (Signed)
   Subjective:    Patient ID: Nicole Norton, female    DOB: 12/22/1940, 75 y.o.   MRN: [id number]  HPI Follow-up depression/anxiety-she is not currently on medication.  Insomnia- pain is not waking her up at much and that has really helped. She is not taking anything for sleep right now.  She is actually opted to hold off on medication when I last saw her. She's actually feeling much better in general.  Polyarthralgia - has f/u iwht [place] for  Few weeks.  She has really been able to get her uric acid down with her diet.  Seeing Dr. Greta Doom for pain she is now on morphine. Has been taking 1 every 2-3 days.   Hypothyroidism-no recent changes in skin or hair. No weight changes that is been stable. She is taking her medication regularly.  Review of Systems     Objective:   Physical Exam  Constitutional: She is oriented to person, place, and time. She appears well-developed and well-nourished.  HENT:  Head: Normocephalic and atraumatic.  Cardiovascular: Normal rate, regular rhythm and normal heart sounds.   Pulmonary/Chest: Effort normal and breath sounds normal.  Neurological: She is alert and oriented to person, place, and time.  Skin: Skin is warm and dry.  Psychiatric: She has a normal mood and affect. Her behavior is normal.          Assessment & Plan:  depression/anxiety-PHQ 9 score of 1 today, previous of 13. GAD 7 score of 2 today, previous of 7. She rates her symptoms as not difficult.  Polyarthralgia- Has a follow up with Dr. Patrecia Pour in the next few weeks. She's done a great job in doing her uric acid level down. It's not quite ideal but it is an improvement. She's really changed her diet and I think that has helped as well. Next  Insomnia-improved now that her pain is under better control. Continue to monitor.  Hypothyroidism-due to recheck thyroid level. Lab slip printed and faxed to the pharmacy.

## 2016-02-02 DIAGNOSIS — E039 Hypothyroidism, unspecified: Secondary | ICD-10-CM | POA: Diagnosis not present

## 2016-02-02 DIAGNOSIS — I10 Essential (primary) hypertension: Secondary | ICD-10-CM | POA: Diagnosis not present

## 2016-02-02 DIAGNOSIS — E785 Hyperlipidemia, unspecified: Secondary | ICD-10-CM | POA: Diagnosis not present

## 2016-02-03 LAB — COMPLETE METABOLIC PANEL WITH GFR
ALT: 22 U/L (ref 6–29)
AST: 29 U/L (ref 10–35)
Albumin: 4.2 g/dL (ref 3.6–5.1)
Alkaline Phosphatase: 92 U/L (ref 33–130)
BUN: 23 mg/dL (ref 7–25)
CALCIUM: 9.7 mg/dL (ref 8.6–10.4)
CO2: 25 mmol/L (ref 20–31)
CREATININE: 0.88 mg/dL (ref 0.60–0.93)
Chloride: 107 mmol/L (ref 98–110)
GFR, Est African American: 76 mL/min (ref >=60)
GFR, Est Non African American: 66 mL/min (ref >=60)
Glucose, Bld: 97 mg/dL (ref 65–99)
Potassium: 4.7 mmol/L (ref 3.5–5.3)
Sodium: 143 mmol/L (ref 135–146)
TOTAL PROTEIN: 6.6 g/dL (ref 6.1–8.1)
Total Bilirubin: 0.4 mg/dL (ref 0.2–1.2)

## 2016-02-03 LAB — LIPID PANEL
Cholesterol: 224 mg/dL — ABNORMAL HIGH (ref 125–200)
HDL: 60 mg/dL (ref >=46)
LDL CALC: 141 mg/dL — AB (ref <130)
Total CHOL/HDL Ratio: 3.7 Ratio (ref <=5.0)
Triglycerides: 117 mg/dL (ref <150)
VLDL: 23 mg/dL (ref <30)

## 2016-02-03 LAB — TSH: TSH: 0.21 m[IU]/L — AB

## 2016-02-18 DIAGNOSIS — G894 Chronic pain syndrome: Secondary | ICD-10-CM | POA: Diagnosis not present

## 2016-02-18 DIAGNOSIS — M47812 Spondylosis without myelopathy or radiculopathy, cervical region: Secondary | ICD-10-CM | POA: Diagnosis not present

## 2016-02-18 DIAGNOSIS — M170 Bilateral primary osteoarthritis of knee: Secondary | ICD-10-CM | POA: Diagnosis not present

## 2016-02-18 DIAGNOSIS — M47817 Spondylosis without myelopathy or radiculopathy, lumbosacral region: Secondary | ICD-10-CM | POA: Diagnosis not present

## 2016-02-19 ENCOUNTER — Other Ambulatory Visit: Payer: Self-pay | Admitting: Family Medicine

## 2016-02-25 ENCOUNTER — Other Ambulatory Visit: Payer: Self-pay

## 2016-03-02 ENCOUNTER — Ambulatory Visit (INDEPENDENT_AMBULATORY_CARE_PROVIDER_SITE_OTHER): Payer: Medicare Other | Admitting: Cardiovascular Disease

## 2016-03-02 ENCOUNTER — Encounter: Payer: Self-pay | Admitting: Cardiovascular Disease

## 2016-03-02 ENCOUNTER — Ambulatory Visit: Payer: Self-pay | Admitting: Cardiovascular Disease

## 2016-03-02 VITALS — BP 142/78 | HR 75 | Ht 66.0 in | Wt 235.6 lb

## 2016-03-02 DIAGNOSIS — I480 Paroxysmal atrial fibrillation: Secondary | ICD-10-CM

## 2016-03-02 MED ORDER — DILTIAZEM HCL ER COATED BEADS 180 MG PO CP24: 180.0000 mg | ORAL_CAPSULE | Freq: Every day | ORAL | Status: DC

## 2016-03-02 MED ORDER — RIVAROXABAN 20 MG PO TABS: 20.0000 mg | ORAL_TABLET | Freq: Every day | ORAL | Status: DC

## 2016-03-02 NOTE — Progress Notes (Signed)
Patient was complaining of palpitations. A recent event monitor showed PAF with RVR. The CHA2DSVASC2 score is  3 . she is symptomatic from this.I'm going to change her amlodipine 2 diltiazem for rate control and will start a Novel oral anticoagulant. I will refer her to the EP service for further evaluation and treatment.

## 2016-03-02 NOTE — Assessment & Plan Note (Signed)
Patient was complaining of palpitations. A recent event monitor showed PAF with RVR. The CHA2DSVASC2 score is  3 . she is symptomatic from this.I'm going to change her amlodipine 2 diltiazem for rate control and will start a Novel oral anticoagulant. I will refer her to the EP service for further evaluation and treatment.

## 2016-03-02 NOTE — Patient Instructions (Addendum)
Medication Instructions:  Your physician has recommended you make the following change in your medication:  1) STOP Norvasc  2) START Cardizem CD 180 mg tablet by mouth ONCE daily 3) START Xarelto 20 mg by mouth ONCE daily by mouth, with evening meal   Labwork: none  Testing/Procedures: none  Follow-Up: You have been referred to Dr. Curt Bears - discuss ablation  Your physician recommends that you schedule a follow-up appointment in: 3 months with Dr. [name] Found.  Any Other Special Instructions Will Be Listed Below (If Applicable).     If you need a refill on your cardiac medications before your next appointment, please call your pharmacy.

## 2016-03-10 ENCOUNTER — Other Ambulatory Visit: Payer: Self-pay | Admitting: Family Medicine
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
## 2016-03-15 ENCOUNTER — Other Ambulatory Visit: Payer: Self-pay | Admitting: Family Medicine

## 2016-03-15 ENCOUNTER — Ambulatory Visit (INDEPENDENT_AMBULATORY_CARE_PROVIDER_SITE_OTHER): Payer: Medicare Other | Admitting: Cardiology

## 2016-03-15 ENCOUNTER — Encounter: Payer: Self-pay | Admitting: Cardiology

## 2016-03-15 VITALS — BP 150/100 | HR 65 | Ht 66.0 in | Wt 235.0 lb

## 2016-03-15 DIAGNOSIS — I480 Paroxysmal atrial fibrillation: Secondary | ICD-10-CM | POA: Diagnosis not present

## 2016-03-15 MED ORDER — AMLODIPINE BESYLATE 10 MG PO TABS: 10.0000 mg | ORAL_TABLET | Freq: Every day | ORAL | Status: DC

## 2016-03-15 MED ORDER — CARVEDILOL 12.5 MG PO TABS: 12.5000 mg | ORAL_TABLET | Freq: Two times a day (BID) | ORAL | Status: DC

## 2016-03-15 MED ORDER — FLECAINIDE ACETATE 50 MG PO TABS: 75.0000 mg | ORAL_TABLET | Freq: Two times a day (BID) | ORAL | Status: DC

## 2016-03-15 NOTE — Patient Instructions (Addendum)
Medication Instructions:  Your physician has recommended you make the following change in your medication:  1) STOP Diltiazem 2) START Carvedilol 12.5 mg twice daily 3) START Flecainide 75 mg twice daily  (start this medication  7-10 days prior to treadmill testing) 4) RESTART Norvasc 10 mg daily  Labwork: None ordered  Testing/Procedures: Your physician has requested that you have an exercise tolerance test. For further information please visit [email or web address]. Please also follow instruction sheet, as given.  Your physician has requested that you have an echocardiogram. Echocardiography is a painless test that uses sound waves to create images of your heart. It provides your doctor with information about the size and shape of your heart and how well your heart's chambers and valves are working. This procedure takes approximately one hour. There are no restrictions for this procedure.  Follow-Up: Your physician recommends that you schedule a follow-up appointment in: 3 months with Dr. Curt Bears.  If you need a refill on your cardiac medications before your next appointment, please call your pharmacy.  Thank you for choosing [facility]!!   Trinidad Curet, RN [phone]  Any Other Special Instructions Will Be Listed Below (If Applicable).  Carvedilol tablets What is this medicine? CARVEDILOL (KAR ve dil ol) is a beta-blocker. Beta-blockers reduce the workload on the heart and help it to beat more regularly. This medicine is used to treat high blood pressure and heart failure. This medicine may be used for other purposes; ask your health care provider or pharmacist if you have questions. What should I tell my health care provider before I take this medicine? They need to know if you have any of these conditions: -circulation problems -diabetes -history of heart attack or heart disease -liver disease -lung or breathing disease, like asthma or  emphysema -pheochromocytoma -slow or irregular heartbeat -thyroid disease -an unusual or allergic reaction to carvedilol, other beta-blockers, medicines, foods, dyes, or preservatives -pregnant or trying to get pregnant -breast-feeding How should I use this medicine? Take this medicine by mouth with a glass of water. Follow the directions on the prescription label. It is best to take the tablets with food. Take your doses at regular intervals. Do not take your medicine more often than directed. Do not stop taking except on the advice of your doctor or health care professional. Talk to your pediatrician regarding the use of this medicine in children. Special care may be needed. Overdosage: If you think you have taken too much of this medicine contact a poison control center or emergency room at once. NOTE: This medicine is only for you. Do not share this medicine with others. What if I miss a dose? If you miss a dose, take it as soon as you can. If it is almost time for your next dose, take only that dose. Do not take double or extra doses. What may interact with this medicine? This medicine may interact with the following medications: -certain medicines for blood pressure, heart disease, irregular heart beat -certain medicines for depression, like fluoxetine or paroxetine -certain medicines for diabetes, like glipizide or glyburide -cimetidine -clonidine -cyclosporine -digoxin -MAOIs like Carbex, Eldepryl, Marplan, Nardil, and Parnate -reserpine -rifampin This list may not describe all possible interactions. Give your health care provider a list of all the medicines, herbs, non-prescription drugs, or dietary supplements you use. Also tell them if you smoke, drink alcohol, or use illegal drugs. Some items may interact with your medicine. What should I watch for while using this medicine? Check your heart  rate and blood pressure regularly while you are taking this medicine. Ask your doctor  or health care professional what your heart rate and blood pressure should be, and when you should contact him or her. Do not stop taking this medicine suddenly. This could lead to serious heart-related effects. Contact your doctor or health care professional if you have difficulty breathing while taking this drug. Check your weight daily. Ask your doctor or health care professional when you should notify him/her of any weight gain. You may get drowsy or dizzy. Do not drive, use machinery, or do anything that requires mental alertness until you know how this medicine affects you. To reduce the risk of dizzy or fainting spells, do not sit or stand up quickly. Alcohol can make you more drowsy, and increase flushing and rapid heartbeats. Avoid alcoholic drinks. If you have diabetes, check your blood sugar as directed. Tell your doctor if you have changes in your blood sugar while you are taking this medicine. If you are going to have surgery, tell your doctor or health care professional that you are taking this medicine. What side effects may I notice from receiving this medicine? Side effects that you should report to your doctor or health care professional as soon as possible: -allergic reactions like skin rash, itching or hives, swelling of the face, lips, or tongue -breathing problems -dark urine -irregular heartbeat -swollen legs or ankles -vomiting -yellowing of the eyes or skin Side effects that usually do not require medical attention (report to your doctor or health care professional if they continue or are bothersome): -change in sex drive or performance -diarrhea -dry eyes (especially if wearing contact lenses) -dry, itching skin -headache -nausea -unusually tired This list may not describe all possible side effects. Call your doctor for medical advice about side effects. You may report side effects to FDA at 1-800-FDA-1088. Where should I keep my medicine? Keep out of the reach of  children. Store at room temperature below 30 degrees C (86 degrees F). Protect from moisture. Keep container tightly closed. Throw away any unused medicine after the expiration date. NOTE: This sheet is a summary. It may not cover all possible information. If you have questions about this medicine, talk to your doctor, pharmacist, or health care provider.    2016, Elsevier/Gold Standard. (2013-07-08 14:12:02)  Flecainide tablets What is this medicine? FLECAINIDE (FLEK a nide) is an antiarrhythmic drug. This medicine is used to prevent irregular heart rhythm. It can also slow down fast heartbeats called tachycardia. This medicine may be used for other purposes; ask your health care provider or pharmacist if you have questions. What should I tell my health care provider before I take this medicine? They need to know if you have any of these conditions: -abnormal levels of potassium in the blood -heart disease including heart rhythm and heart rate problems -kidney or liver disease -recent heart attack -an unusual or allergic reaction to flecainide, local anesthetics, other medicines, foods, dyes, or preservatives -pregnant or trying to get pregnant -breast-feeding How should I use this medicine? Take this medicine by mouth with a glass of water. Follow the directions on the prescription label. You can take this medicine with or without food. Take your doses at regular intervals. Do not take your medicine more often than directed. Do not stop taking this medicine suddenly. This may cause serious, heart-related side effects. If your doctor wants you to stop the medicine, the dose may be slowly lowered over time to avoid any  side effects. Talk to your pediatrician regarding the use of this medicine in children. While this drug may be prescribed for children as young as 1 year of age for selected conditions, precautions do apply. Overdosage: If you think you have taken too much of this medicine  contact a poison control center or emergency room at once. NOTE: This medicine is only for you. Do not share this medicine with others. What if I miss a dose? If you miss a dose, take it as soon as you can. If it is almost time for your next dose, take only that dose. Do not take double or extra doses. What may interact with this medicine? Do not take this medicine with any of the following medications: -amoxapine -arsenic trioxide -certain antibiotics like clarithromycin, erythromycin, gatifloxacin, gemifloxacin, levofloxacin, moxifloxacin, sparfloxacin, or troleandomycin -certain antidepressants called tricyclic antidepressants like amitriptyline, imipramine, or nortriptyline -certain medicines to control heart rhythm like disopyramide, dofetilide, encainide, moricizine, procainamide, propafenone, and quinidine -cisapride -cyclobenzaprine -delavirdine -droperidol -haloperidol -hawthorn -imatinib -levomethadyl -maprotiline -medicines for malaria like chloroquine and halofantrine -pentamidine -phenothiazines like chlorpromazine, mesoridazine, prochlorperazine, thioridazine -pimozide -quinine -ranolazine -ritonavir -sertindole -ziprasidone This medicine may also interact with the following medications: -cimetidine -medicines for angina or high blood pressure -medicines to control heart rhythm like amiodarone and digoxin This list may not describe all possible interactions. Give your health care provider a list of all the medicines, herbs, non-prescription drugs, or dietary supplements you use. Also tell them if you smoke, drink alcohol, or use illegal drugs. Some items may interact with your medicine. What should I watch for while using this medicine? Visit your doctor or health care professional for regular checks on your progress. Because your condition and the use of this medicine carries some risk, it is a good idea to carry an identification card, necklace or bracelet with  details of your condition, medications and doctor or health care professional. Check your blood pressure and pulse rate regularly. Ask your health care professional what your blood pressure and pulse rate should be, and when you should contact him or her. Your doctor or health care professional also may schedule regular blood tests and electrocardiograms to check your progress. You may get drowsy or dizzy. Do not drive, use machinery, or do anything that needs mental alertness until you know how this medicine affects you. Do not stand or sit up quickly, especially if you are an older patient. This reduces the risk of dizzy or fainting spells. Alcohol can make you more dizzy, increase flushing and rapid heartbeats. Avoid alcoholic drinks. What side effects may I notice from receiving this medicine? Side effects that you should report to your doctor or health care professional as soon as possible: -chest pain, continued irregular heartbeats -difficulty breathing -swelling of the legs or feet -trembling, shaking -unusually weak or tired Side effects that usually do not require medical attention (report to your doctor or health care professional if they continue or are bothersome): -blurred vision -constipation -headache -nausea, vomiting -stomach pain This list may not describe all possible side effects. Call your doctor for medical advice about side effects. You may report side effects to FDA at 1-800-FDA-1088. Where should I keep my medicine? Keep out of the reach of children. Store at room temperature between 15 and 30 degrees C (59 and 86 degrees F). Protect from light. Keep container tightly closed. Throw away any unused medicine after the expiration date. NOTE: This sheet is a summary. It may not cover all possible information.  If you have questions about this medicine, talk to your doctor, pharmacist, or health care provider.    2016, Elsevier/Gold Standard. (2008-03-06 16:46:09)

## 2016-03-16 ENCOUNTER — Other Ambulatory Visit: Payer: Self-pay

## 2016-03-16 ENCOUNTER — Ambulatory Visit (HOSPITAL_COMMUNITY): Payer: Medicare Other | Attending: Internal Medicine

## 2016-03-16 DIAGNOSIS — I119 Hypertensive heart disease without heart failure: Secondary | ICD-10-CM | POA: Diagnosis not present

## 2016-03-16 DIAGNOSIS — E785 Hyperlipidemia, unspecified: Secondary | ICD-10-CM | POA: Diagnosis not present

## 2016-03-16 DIAGNOSIS — I4891 Unspecified atrial fibrillation: Secondary | ICD-10-CM | POA: Diagnosis present

## 2016-03-16 DIAGNOSIS — I059 Rheumatic mitral valve disease, unspecified: Secondary | ICD-10-CM | POA: Diagnosis not present

## 2016-03-16 DIAGNOSIS — Z6837 Body mass index (BMI) 37.0-37.9, adult: Secondary | ICD-10-CM | POA: Insufficient documentation

## 2016-03-16 DIAGNOSIS — Z87891 Personal history of nicotine dependence: Secondary | ICD-10-CM | POA: Insufficient documentation

## 2016-03-16 DIAGNOSIS — Z8249 Family history of ischemic heart disease and other diseases of the circulatory system: Secondary | ICD-10-CM | POA: Insufficient documentation

## 2016-03-16 DIAGNOSIS — I480 Paroxysmal atrial fibrillation: Secondary | ICD-10-CM | POA: Insufficient documentation

## 2016-03-16 DIAGNOSIS — E669 Obesity, unspecified: Secondary | ICD-10-CM | POA: Diagnosis not present

## 2016-03-19 DIAGNOSIS — M47812 Spondylosis without myelopathy or radiculopathy, cervical region: Secondary | ICD-10-CM | POA: Diagnosis not present

## 2016-03-19 DIAGNOSIS — G894 Chronic pain syndrome: Secondary | ICD-10-CM | POA: Diagnosis not present

## 2016-03-19 DIAGNOSIS — M170 Bilateral primary osteoarthritis of knee: Secondary | ICD-10-CM | POA: Diagnosis not present

## 2016-03-19 DIAGNOSIS — M47817 Spondylosis without myelopathy or radiculopathy, lumbosacral region: Secondary | ICD-10-CM | POA: Diagnosis not present

## 2016-03-24 ENCOUNTER — Other Ambulatory Visit: Payer: Self-pay

## 2016-03-24 ENCOUNTER — Other Ambulatory Visit: Payer: Self-pay | Admitting: *Deleted

## 2016-03-24 DIAGNOSIS — Z1231 Encounter for screening mammogram for malignant neoplasm of breast: Secondary | ICD-10-CM

## 2016-03-25 ENCOUNTER — Ambulatory Visit (INDEPENDENT_AMBULATORY_CARE_PROVIDER_SITE_OTHER): Payer: Medicare Other

## 2016-03-25 DIAGNOSIS — I480 Paroxysmal atrial fibrillation: Secondary | ICD-10-CM | POA: Diagnosis not present

## 2016-03-25 LAB — EXERCISE TOLERANCE TEST
CHL CUP MPHR: 148 {beats}/min
CHL CUP STRESS STAGE 1 DBP: 101 mmHg
CHL CUP STRESS STAGE 1 HR: 68 {beats}/min
CHL CUP STRESS STAGE 1 SPEED: 0 mph
CHL CUP STRESS STAGE 2 HR: 69 {beats}/min
CHL CUP STRESS STAGE 3 HR: 69 {beats}/min
CHL CUP STRESS STAGE 4 SBP: 193 mmHg
CHL CUP STRESS STAGE 4 SPEED: 1.7 mph
CHL CUP STRESS STAGE 5 GRADE: 12 %
CHL CUP STRESS STAGE 5 HR: 121 {beats}/min
CHL CUP STRESS STAGE 6 GRADE: 12 %
CHL CUP STRESS STAGE 6 HR: 121 {beats}/min
CHL CUP STRESS STAGE 6 SPEED: 2.5 mph
CHL CUP STRESS STAGE 7 DBP: 76 mmHg
CHL CUP STRESS STAGE 7 SBP: 187 mmHg
CHL CUP STRESS STAGE 8 DBP: 67 mmHg
CHL CUP STRESS STAGE 8 SPEED: 0 mph
CSEPHR: 82 %
Estimated workload: 7 METS
Exercise duration (min): 6 min
Exercise duration (sec): 0 s
Peak HR: 121 {beats}/min
Percent of predicted max HR: 81 %
RPE: 17
Rest HR: 67 {beats}/min
Stage 1 Grade: 0 %
Stage 1 SBP: 158 mmHg
Stage 2 Grade: 0 %
Stage 2 Speed: 1 mph
Stage 3 Grade: 0.1 %
Stage 3 Speed: 1 mph
Stage 4 DBP: 74 mmHg
Stage 4 Grade: 10 %
Stage 4 HR: 105 {beats}/min
Stage 5 DBP: 71 mmHg
Stage 5 SBP: 207 mmHg
Stage 5 Speed: 2.5 mph
Stage 7 Grade: 0 %
Stage 7 HR: 106 {beats}/min
Stage 7 Speed: 0 mph
Stage 8 Grade: 0 %
Stage 8 HR: 72 {beats}/min
Stage 8 SBP: 161 mmHg

## 2016-04-13 NOTE — Addendum Note (Signed)
Addended by: Teddy Spike on: 04/13/2016 04:21 PM   Modules accepted: Orders

## 2016-04-16 DIAGNOSIS — M47817 Spondylosis without myelopathy or radiculopathy, lumbosacral region: Secondary | ICD-10-CM | POA: Diagnosis not present

## 2016-04-16 DIAGNOSIS — M170 Bilateral primary osteoarthritis of knee: Secondary | ICD-10-CM | POA: Diagnosis not present

## 2016-04-16 DIAGNOSIS — G894 Chronic pain syndrome: Secondary | ICD-10-CM | POA: Diagnosis not present

## 2016-04-16 DIAGNOSIS — E039 Hypothyroidism, unspecified: Secondary | ICD-10-CM | POA: Diagnosis not present

## 2016-04-16 DIAGNOSIS — M47812 Spondylosis without myelopathy or radiculopathy, cervical region: Secondary | ICD-10-CM | POA: Diagnosis not present

## 2016-04-16 LAB — TSH: TSH: 0.39 m[IU]/L — AB

## 2016-04-19 NOTE — Addendum Note (Signed)
Addended by: Teddy Spike on: 04/19/2016 07:58 AM   Modules accepted: Orders

## 2016-05-05 ENCOUNTER — Ambulatory Visit
Admission: RE | Admit: 2016-05-05 | Discharge: 2016-05-05 | Disposition: A | Discharge status: Home / Self Care | Payer: Medicare Other | Source: Ambulatory Visit

## 2016-05-05 DIAGNOSIS — Z1231 Encounter for screening mammogram for malignant neoplasm of breast: Secondary | ICD-10-CM

## 2016-05-17 DIAGNOSIS — H43813 Vitreous degeneration, bilateral: Secondary | ICD-10-CM | POA: Diagnosis not present

## 2016-06-02 ENCOUNTER — Ambulatory Visit: Payer: Self-pay | Admitting: Cardiovascular Disease

## 2016-06-02 ENCOUNTER — Other Ambulatory Visit: Payer: Self-pay | Admitting: Family Medicine

## 2016-06-09 ENCOUNTER — Encounter: Payer: Self-pay | Admitting: Cardiovascular Disease

## 2016-06-09 ENCOUNTER — Ambulatory Visit (INDEPENDENT_AMBULATORY_CARE_PROVIDER_SITE_OTHER): Payer: Medicare Other | Admitting: Cardiovascular Disease

## 2016-06-09 DIAGNOSIS — I480 Paroxysmal atrial fibrillation: Secondary | ICD-10-CM | POA: Diagnosis not present

## 2016-06-09 DIAGNOSIS — I10 Essential (primary) hypertension: Secondary | ICD-10-CM | POA: Diagnosis not present

## 2016-06-09 DIAGNOSIS — E785 Hyperlipidemia, unspecified: Secondary | ICD-10-CM

## 2016-06-09 NOTE — Assessment & Plan Note (Signed)
History of paroxysmal atrial fibrillation seen on event monitoring improved on flecainide and currently on Xarelto .

## 2016-06-09 NOTE — Progress Notes (Signed)
06/09/2016 Nicole Norton   29-Jul-1941  [id number]  Primary Physician [place], [place] Primary Cardiologist: Lorretta Harp MD Renae Gloss  HPI:  [name] is a 75 year old mildly overweight married Caucasian female with no children who I have taken care of several years ago and have not seen her back since. Her  husband Nicole Norton was also a patient of mine. I last saw her in the office 03/02/16. She has a history of hypertension, hyperlipidemia and an arrhythmia which I diagnosed 10-15 years ago. She has never had a heart attack or stroke. She's had multiple stress test off which have been normal as well as 2-D echocardiograms and event monitors. She is statin intolerant. She denies chest pain or shortness of breath. She recently was diagnosed with gout as well as osteoarthritis arthritis. [name] She was placed on gout medicines with resulted in exacerbation of her arrhythmia. Once these medicines were discontinued her arrhythmia returned to baseline. An event monitor showed episodes of paroxysmal atrial fibrillation. A routine GXT was normal. I referred her to Dr. Curt Norton , electrophysiology, who placed her on flecainide and Xarelto. Her A. fib has markedly improved as has her blood pressure control.    Current Outpatient Prescriptions  Medication Sig Dispense Refill  . amLODipine (NORVASC) 10 MG tablet Take 1 tablet (10 mg total) by mouth daily. 30 tablet 3  . CARAFATE 1 GM/10ML suspension TAKE 10 ML FOUR TIMES A DAY WITH MEALS AND AT BEDTIME 420 mL 2  . carvedilol (COREG) 12.5 MG tablet Take 1 tablet (12.5 mg total) by mouth 2 (two) times daily. 60 tablet 3  . cetirizine (ZYRTEC) 10 MG tablet Take 10 mg by mouth as needed.      . clobetasol cream (TEMOVATE) 0.05 % Apply topically daily as needed. 60 g 0  . estrogens, conjugated, (PREMARIN) 0.625 MG tablet Take 0.625 mg by mouth 2 (two) times a week. Take daily for 21 days then do not take for 7 days.    . flecainide  (TAMBOCOR) 50 MG tablet Take 1.5 tablets (75 mg total) by mouth 2 (two) times daily. 90 tablet 3  . lansoprazole (PREVACID) 30 MG capsule Take 1 capsule (30 mg total) by mouth daily. 90 capsule 1  . levothyroxine (SYNTHROID, LEVOTHROID) 200 MCG tablet TAKE ONE TABLET BY MOUTH EVERY DAY *APPOINTMENT NEEDED FOR FURTHER REFILLS* 90 tablet 0  . levothyroxine (SYNTHROID, LEVOTHROID) 25 MCG tablet TAKE ONE TABLET EVERY DAY BEFORE BREAKFAST *APPT NEEDED FOR FURTHER REFILLS* 90 tablet 0  . Misc Natural Products (GLUCOSAMINE CHONDROITIN ADV PO) Take 1 tablet by mouth daily.    . Multiple Vitamins-Minerals (CENTRUM SILVER PO) Take by mouth daily.      [name] olmesartan (BENICAR) 40 MG tablet TAKE ONE TABLET BY MOUTH EVERY DAY 90 tablet 0  . Omega-3 Fatty Acids (FISH OIL) 1200 MG CAPS Take by mouth daily.      [name] oxyCODONE (OXY IR/ROXICODONE) 5 MG immediate release tablet Take 3 tablets by mouth 3 (three) times daily.    . predniSONE (DELTASONE) 20 MG tablet Take 1 tablet by mouth. 3 times a Month    . rivaroxaban (XARELTO) 20 MG TABS tablet Take 1 tablet (20 mg total) by mouth daily with supper. 90 tablet 3  . vitamin B-12 (CYANOCOBALAMIN) 500 MCG tablet Take 500 mcg by mouth. 3 WEEKLY     No current facility-administered medications for this visit.     Allergies  Allergen Reactions  . Colchicine Other (  See Comments)    palpitations  . Cymbalta [Duloxetine Hcl] Other (See Comments)    palpitations  . Etodolac Other (See Comments)    heart flutters and headaches   . Penicillins   . Solu-Medrol [Methylprednisolone Acetate]     Heart palpitations/bad headache  . Statins     REACTION: myalgias    Social History   Social History  . Marital status: Married    Spouse name: N/A  . Number of children: N/A  . Years of education: N/A   Occupational History  . Not on file.   Social History Main Topics  . Smoking status: Former Smoker    Quit date: 11/16/1999  . Smokeless tobacco: Not on file  .  Alcohol use 3.6 oz/week    6 Glasses of wine per week  . Drug use: No  . Sexual activity: Not on file   Other Topics Concern  . Not on file   Social History Narrative  . No narrative on file     Review of Systems: General: negative for chills, fever, night sweats or weight changes.  Cardiovascular: negative for chest pain, dyspnea on exertion, edema, orthopnea, palpitations, paroxysmal nocturnal dyspnea or shortness of breath Dermatological: negative for rash Respiratory: negative for cough or wheezing Urologic: negative for hematuria Abdominal: negative for nausea, vomiting, diarrhea, bright red blood per rectum, melena, or hematemesis Neurologic: negative for visual changes, syncope, or dizziness All other systems reviewed and are otherwise negative except as noted above.    Blood pressure (!) 164/96, pulse 64, height 5\' 6"  (1.676 m), weight 235 lb 3.2 oz (106.7 kg).  General appearance: alert and no distress Neck: no adenopathy, no carotid bruit, no JVD, supple, symmetrical, trachea midline and thyroid not enlarged, symmetric, no tenderness/mass/nodules Lungs: clear to auscultation bilaterally Heart: regular rate and rhythm, S1, S2 normal, no murmur, click, rub or gallop Extremities: extremities normal, atraumatic, no cyanosis or edema  EKG not performed today  ASSESSMENT AND PLAN:   Hyperlipidemia LDL goal <160 History of hyperlipidemia intolerant to statin drugs  ESSENTIAL HYPERTENSION, BENIGN History of hypertension blood pressure measured at 164/96. She is on amlodipine, carvedilol and Benicar. Her blood pressures have been under much better control at home although she is under a lot of stress this week. Continue current meds at current dosing  Cardiac arrhythmia History of paroxysmal atrial fibrillation seen on event monitoring improved on flecainide and currently on Xarelto .      Lorretta Harp MD FACP,FACC,FAHA, [facility] 06/09/2016 10:36 AM

## 2016-06-09 NOTE — Patient Instructions (Signed)
Your physician wants you to follow-up in: 1 Year. You will receive a reminder letter in the mail two months in advance. If you don't receive a letter, please call our office to schedule the follow-up appointment.  

## 2016-06-09 NOTE — Assessment & Plan Note (Signed)
History of hypertension blood pressure measured at 164/96. She is on amlodipine, carvedilol and Benicar. Her blood pressures have been under much better control at home although she is under a lot of stress this week. Continue current meds at current dosing

## 2016-06-09 NOTE — Assessment & Plan Note (Signed)
History of hyperlipidemia intolerant to statin drugs 

## 2016-06-14 ENCOUNTER — Other Ambulatory Visit: Payer: Self-pay

## 2016-06-14 DIAGNOSIS — E039 Hypothyroidism, unspecified: Secondary | ICD-10-CM

## 2016-06-14 DIAGNOSIS — M109 Gout, unspecified: Secondary | ICD-10-CM | POA: Diagnosis not present

## 2016-06-14 LAB — TSH: TSH: 1.72 mIU/L

## 2016-06-14 LAB — URIC ACID: URIC ACID, SERUM: 7.3 mg/dL — AB (ref 2.5–7.0)

## 2016-06-15 ENCOUNTER — Other Ambulatory Visit: Payer: Self-pay | Admitting: *Deleted

## 2016-06-15 MED ORDER — LEVOTHYROXINE SODIUM 25 MCG PO TABS: 25.0000 ug | ORAL_TABLET | Freq: Every day | ORAL | 1 refills | Status: DC

## 2016-06-15 MED ORDER — LEVOTHYROXINE SODIUM 200 MCG PO TABS: 200.0000 ug | ORAL_TABLET | Freq: Every day | ORAL | 1 refills | Status: DC

## 2016-06-16 ENCOUNTER — Encounter: Payer: Self-pay | Admitting: Cardiology

## 2016-06-16 ENCOUNTER — Ambulatory Visit: Payer: Self-pay | Admitting: Cardiology

## 2016-06-16 ENCOUNTER — Ambulatory Visit (INDEPENDENT_AMBULATORY_CARE_PROVIDER_SITE_OTHER): Payer: Medicare Other | Admitting: Cardiology

## 2016-06-16 VITALS — BP 148/90 | HR 60 | Ht 66.0 in | Wt 236.4 lb

## 2016-06-16 DIAGNOSIS — I480 Paroxysmal atrial fibrillation: Secondary | ICD-10-CM | POA: Diagnosis not present

## 2016-06-16 MED ORDER — AMLODIPINE BESYLATE 10 MG PO TABS: 10.0000 mg | ORAL_TABLET | Freq: Every day | ORAL | 2 refills | Status: DC

## 2016-06-16 MED ORDER — CARVEDILOL 12.5 MG PO TABS: 12.5000 mg | ORAL_TABLET | Freq: Two times a day (BID) | ORAL | 2 refills | Status: DC

## 2016-06-16 MED ORDER — FLECAINIDE ACETATE 150 MG PO TABS: 75.0000 mg | ORAL_TABLET | Freq: Two times a day (BID) | ORAL | 2 refills | Status: DC

## 2016-06-16 NOTE — Patient Instructions (Signed)
Medication Instructions:  Your physician recommends that you continue on your current medications as directed. Please refer to the Current Medication list given to you today.  If you need a refill on your cardiac medications before your next appointment, please call your pharmacy.   Labwork: None ordered  Testing/Procedures: None ordered  Follow-Up: Your physician wants you to follow-up in: 6 months with Dr. Camnitz.  You will receive a reminder letter in the mail two months in advance. If you don't receive a letter, please call our office to schedule the follow-up appointment.  Thank you for choosing [facility]!!   Sherri Price, RN [phone]         
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
## 2016-06-17 DIAGNOSIS — M170 Bilateral primary osteoarthritis of knee: Secondary | ICD-10-CM | POA: Diagnosis not present

## 2016-06-17 DIAGNOSIS — M47812 Spondylosis without myelopathy or radiculopathy, cervical region: Secondary | ICD-10-CM | POA: Diagnosis not present

## 2016-06-17 DIAGNOSIS — M47817 Spondylosis without myelopathy or radiculopathy, lumbosacral region: Secondary | ICD-10-CM | POA: Diagnosis not present

## 2016-06-17 DIAGNOSIS — G894 Chronic pain syndrome: Secondary | ICD-10-CM | POA: Diagnosis not present

## 2016-07-05 DIAGNOSIS — M170 Bilateral primary osteoarthritis of knee: Secondary | ICD-10-CM | POA: Diagnosis not present

## 2016-07-09 ENCOUNTER — Other Ambulatory Visit: Payer: Self-pay

## 2016-07-10 ENCOUNTER — Other Ambulatory Visit: Payer: Self-pay | Admitting: Family Medicine

## 2016-07-14 DIAGNOSIS — M47817 Spondylosis without myelopathy or radiculopathy, lumbosacral region: Secondary | ICD-10-CM | POA: Diagnosis not present

## 2016-07-14 DIAGNOSIS — M47812 Spondylosis without myelopathy or radiculopathy, cervical region: Secondary | ICD-10-CM | POA: Diagnosis not present

## 2016-07-14 DIAGNOSIS — M170 Bilateral primary osteoarthritis of knee: Secondary | ICD-10-CM | POA: Diagnosis not present

## 2016-07-14 DIAGNOSIS — G894 Chronic pain syndrome: Secondary | ICD-10-CM | POA: Diagnosis not present

## 2016-08-02 ENCOUNTER — Ambulatory Visit (INDEPENDENT_AMBULATORY_CARE_PROVIDER_SITE_OTHER): Payer: Medicare Other | Admitting: Family Medicine

## 2016-08-02 ENCOUNTER — Encounter: Payer: Self-pay | Admitting: Family Medicine

## 2016-08-02 VITALS — BP 134/80 | HR 61 | Temp 97.9°F | Wt 232.0 lb

## 2016-08-02 DIAGNOSIS — J208 Acute bronchitis due to other specified organisms: Secondary | ICD-10-CM | POA: Diagnosis not present

## 2016-08-02 DIAGNOSIS — M79671 Pain in right foot: Secondary | ICD-10-CM

## 2016-08-02 NOTE — Patient Instructions (Signed)
Can use 2 inhalation on the proair every 8 hours as needed for wheezing, chest tightness and Shortness of breath Call if you feel like you are getting worse.

## 2016-08-02 NOTE — Progress Notes (Signed)
Subjective:    CC: Cough  [id number] comes in today complaining of a croupy productive cough for the last 5 days. She's also had some intermittent wheezing. As well as some diarrhea and low energy. She's not been sleeping well because of the cough.no fever.  No ST.  Diarrhea is better now.    Right foot pain-a couple weeks ago she just woke up in the middle of the night and had a sudden sharp pain in her distal right foot. Couple days later she noticed that it was actually bruised. She says it was really sore to walk on for a couple of days but seems to be getting better. She just wanted to mention it today in case it doesn't continue to heal.    Past medical history, Surgical history, Family history not pertinant except as noted below, Social history, Allergies, and medications have been entered into the medical record, reviewed, and corrections made.   Review of Systems: No fevers, chills, night sweats, weight loss, chest pain, or shortness of breath.   Objective:    General: Well Developed, well nourished, and in no acute distress.  Neuro: Alert and oriented x3, extra-ocular muscles intact, sensation grossly intact.  HEENT: Normocephalic, atraumatic, Oropharynx is clear, TMs and canals are clear bilaterally, no significant cervical lymphadenopathy. Skin: Warm and dry, no rashes. Cardiac: Regular rate and rhythm, no murmurs rubs or gallops, no lower extremity edema.  Respiratory: Clear to auscultation bilaterally. Not using accessory muscles, speaking in full sentences. MSK: She does have some swelling and bruising on the top of the right foot. Just slightly tender on exam. She is able to walk on it normally. Normal flexion extension of the toes.   Impression and Recommendations:    Viral bronchitis - given proair respiclick inhaler to use PRN.  Call/return if not improving. Explain likely viral and does not need antibiotics at this point is continuing to worsen or not getting better  than please let me know.  Right foot pain - if not healing then we can get an xray to evaluate for MT fracture. Will call back if needed. Right now she wants to hold off.

## 2016-08-16 DIAGNOSIS — M170 Bilateral primary osteoarthritis of knee: Secondary | ICD-10-CM | POA: Diagnosis not present

## 2016-08-16 DIAGNOSIS — M47817 Spondylosis without myelopathy or radiculopathy, lumbosacral region: Secondary | ICD-10-CM | POA: Diagnosis not present

## 2016-08-16 DIAGNOSIS — M47812 Spondylosis without myelopathy or radiculopathy, cervical region: Secondary | ICD-10-CM | POA: Diagnosis not present

## 2016-08-16 DIAGNOSIS — G894 Chronic pain syndrome: Secondary | ICD-10-CM | POA: Diagnosis not present

## 2016-09-07 ENCOUNTER — Other Ambulatory Visit: Payer: Self-pay | Admitting: Family Medicine

## 2016-09-10 DIAGNOSIS — R5383 Other fatigue: Secondary | ICD-10-CM | POA: Diagnosis not present

## 2016-09-10 DIAGNOSIS — M545 Low back pain: Secondary | ICD-10-CM | POA: Diagnosis not present

## 2016-09-10 DIAGNOSIS — M2550 Pain in unspecified joint: Secondary | ICD-10-CM | POA: Diagnosis not present

## 2016-09-10 DIAGNOSIS — M1009 Idiopathic gout, multiple sites: Secondary | ICD-10-CM | POA: Diagnosis not present

## 2016-09-10 DIAGNOSIS — M797 Fibromyalgia: Secondary | ICD-10-CM | POA: Diagnosis not present

## 2016-09-10 DIAGNOSIS — M150 Primary generalized (osteo)arthritis: Secondary | ICD-10-CM | POA: Diagnosis not present

## 2016-09-13 DIAGNOSIS — M170 Bilateral primary osteoarthritis of knee: Secondary | ICD-10-CM | POA: Diagnosis not present

## 2016-09-13 DIAGNOSIS — G894 Chronic pain syndrome: Secondary | ICD-10-CM | POA: Diagnosis not present

## 2016-09-13 DIAGNOSIS — M47812 Spondylosis without myelopathy or radiculopathy, cervical region: Secondary | ICD-10-CM | POA: Diagnosis not present

## 2016-09-13 DIAGNOSIS — M47817 Spondylosis without myelopathy or radiculopathy, lumbosacral region: Secondary | ICD-10-CM | POA: Diagnosis not present

## 2016-09-14 ENCOUNTER — Other Ambulatory Visit: Payer: Self-pay | Admitting: Rheumatology

## 2016-09-14 DIAGNOSIS — M25472 Effusion, left ankle: Secondary | ICD-10-CM

## 2016-09-14 DIAGNOSIS — M25579 Pain in unspecified ankle and joints of unspecified foot: Secondary | ICD-10-CM

## 2016-09-23 ENCOUNTER — Other Ambulatory Visit: Payer: Self-pay | Admitting: Rheumatology

## 2016-09-23 ENCOUNTER — Telehealth: Payer: Self-pay | Admitting: Rheumatology

## 2016-09-23 DIAGNOSIS — M2550 Pain in unspecified joint: Secondary | ICD-10-CM | POA: Diagnosis not present

## 2016-09-23 DIAGNOSIS — M25579 Pain in unspecified ankle and joints of unspecified foot: Secondary | ICD-10-CM

## 2016-09-23 DIAGNOSIS — M797 Fibromyalgia: Secondary | ICD-10-CM | POA: Diagnosis not present

## 2016-09-23 DIAGNOSIS — M1009 Idiopathic gout, multiple sites: Secondary | ICD-10-CM | POA: Diagnosis not present

## 2016-09-23 DIAGNOSIS — M150 Primary generalized (osteo)arthritis: Secondary | ICD-10-CM | POA: Diagnosis not present

## 2016-09-23 DIAGNOSIS — M545 Low back pain: Secondary | ICD-10-CM | POA: Diagnosis not present

## 2016-09-23 DIAGNOSIS — M25472 Effusion, left ankle: Secondary | ICD-10-CM

## 2016-09-23 NOTE — Telephone Encounter (Signed)
Nicole Norton w/Dr. Lenna Gilford office calling to get the last office visit note for patient. She is requesting that it be faxed to the following number :  [phone]

## 2016-10-05 ENCOUNTER — Ambulatory Visit
Admission: RE | Admit: 2016-10-05 | Discharge: 2016-10-05 | Disposition: A | Discharge status: Home / Self Care | Payer: Medicare Other | Source: Ambulatory Visit | Attending: Rheumatology | Admitting: Rheumatology

## 2016-10-05 DIAGNOSIS — M25572 Pain in left ankle and joints of left foot: Secondary | ICD-10-CM | POA: Diagnosis not present

## 2016-10-05 DIAGNOSIS — M25579 Pain in unspecified ankle and joints of unspecified foot: Secondary | ICD-10-CM

## 2016-10-05 DIAGNOSIS — M7989 Other specified soft tissue disorders: Secondary | ICD-10-CM | POA: Diagnosis not present

## 2016-10-05 DIAGNOSIS — M25472 Effusion, left ankle: Secondary | ICD-10-CM

## 2016-10-05 MED ORDER — GADOBENATE DIMEGLUMINE 529 MG/ML IV SOLN
10.0000 mL | Freq: Once | INTRAVENOUS | Status: AC | PRN
  Administered 2016-10-05: 10 mL via INTRAVENOUS

## 2016-10-11 ENCOUNTER — Other Ambulatory Visit: Payer: Self-pay

## 2016-10-11 MED ORDER — LANSOPRAZOLE 30 MG PO CPDR: 30.0000 mg | DELAYED_RELEASE_CAPSULE | Freq: Every day | ORAL | 0 refills | Status: DC

## 2016-10-11 MED ORDER — OLMESARTAN MEDOXOMIL 40 MG PO TABS: 40.0000 mg | ORAL_TABLET | Freq: Every day | ORAL | 1 refills | Status: DC

## 2016-10-13 ENCOUNTER — Other Ambulatory Visit: Payer: Self-pay

## 2016-10-13 DIAGNOSIS — M170 Bilateral primary osteoarthritis of knee: Secondary | ICD-10-CM | POA: Diagnosis not present

## 2016-10-13 DIAGNOSIS — M47817 Spondylosis without myelopathy or radiculopathy, lumbosacral region: Secondary | ICD-10-CM | POA: Diagnosis not present

## 2016-10-13 DIAGNOSIS — G894 Chronic pain syndrome: Secondary | ICD-10-CM | POA: Diagnosis not present

## 2016-10-13 DIAGNOSIS — M47812 Spondylosis without myelopathy or radiculopathy, cervical region: Secondary | ICD-10-CM | POA: Diagnosis not present

## 2016-10-13 MED ORDER — FLECAINIDE ACETATE 150 MG PO TABS: 75.0000 mg | ORAL_TABLET | Freq: Two times a day (BID) | ORAL | 1 refills | Status: DC

## 2016-10-13 MED ORDER — CARVEDILOL 12.5 MG PO TABS: 12.5000 mg | ORAL_TABLET | Freq: Two times a day (BID) | ORAL | 1 refills | Status: DC

## 2016-10-13 MED ORDER — AMLODIPINE BESYLATE 10 MG PO TABS: 10.0000 mg | ORAL_TABLET | Freq: Every day | ORAL | 1 refills | Status: DC

## 2016-10-21 DIAGNOSIS — M170 Bilateral primary osteoarthritis of knee: Secondary | ICD-10-CM | POA: Diagnosis not present

## 2016-10-22 DIAGNOSIS — M1009 Idiopathic gout, multiple sites: Secondary | ICD-10-CM | POA: Diagnosis not present

## 2016-10-22 DIAGNOSIS — M150 Primary generalized (osteo)arthritis: Secondary | ICD-10-CM | POA: Diagnosis not present

## 2016-10-28 DIAGNOSIS — M170 Bilateral primary osteoarthritis of knee: Secondary | ICD-10-CM | POA: Diagnosis not present

## 2016-11-05 DIAGNOSIS — M170 Bilateral primary osteoarthritis of knee: Secondary | ICD-10-CM | POA: Diagnosis not present

## 2016-11-12 DIAGNOSIS — M797 Fibromyalgia: Secondary | ICD-10-CM | POA: Diagnosis not present

## 2016-11-12 DIAGNOSIS — M189 Osteoarthritis of first carpometacarpal joint, unspecified: Secondary | ICD-10-CM | POA: Diagnosis not present

## 2016-11-12 DIAGNOSIS — M47817 Spondylosis without myelopathy or radiculopathy, lumbosacral region: Secondary | ICD-10-CM | POA: Diagnosis not present

## 2016-11-12 DIAGNOSIS — M170 Bilateral primary osteoarthritis of knee: Secondary | ICD-10-CM | POA: Diagnosis not present

## 2016-11-12 DIAGNOSIS — M109 Gout, unspecified: Secondary | ICD-10-CM | POA: Diagnosis not present

## 2016-11-12 DIAGNOSIS — G894 Chronic pain syndrome: Secondary | ICD-10-CM | POA: Diagnosis not present

## 2016-11-12 DIAGNOSIS — F419 Anxiety disorder, unspecified: Secondary | ICD-10-CM | POA: Diagnosis not present

## 2016-11-12 DIAGNOSIS — M47812 Spondylosis without myelopathy or radiculopathy, cervical region: Secondary | ICD-10-CM | POA: Diagnosis not present

## 2016-11-12 DIAGNOSIS — G4700 Insomnia, unspecified: Secondary | ICD-10-CM | POA: Diagnosis not present

## 2016-11-23 DIAGNOSIS — Z6837 Body mass index (BMI) 37.0-37.9, adult: Secondary | ICD-10-CM | POA: Diagnosis not present

## 2016-11-23 DIAGNOSIS — M2550 Pain in unspecified joint: Secondary | ICD-10-CM | POA: Diagnosis not present

## 2016-11-23 DIAGNOSIS — M1009 Idiopathic gout, multiple sites: Secondary | ICD-10-CM | POA: Diagnosis not present

## 2016-11-23 DIAGNOSIS — M150 Primary generalized (osteo)arthritis: Secondary | ICD-10-CM | POA: Diagnosis not present

## 2016-11-23 DIAGNOSIS — M797 Fibromyalgia: Secondary | ICD-10-CM | POA: Diagnosis not present

## 2016-11-23 DIAGNOSIS — E669 Obesity, unspecified: Secondary | ICD-10-CM | POA: Diagnosis not present

## 2016-11-25 ENCOUNTER — Encounter: Payer: Self-pay | Admitting: Cardiology

## 2016-11-25 ENCOUNTER — Ambulatory Visit (INDEPENDENT_AMBULATORY_CARE_PROVIDER_SITE_OTHER): Payer: Medicare Other | Admitting: Cardiology

## 2016-11-25 VITALS — BP 138/90 | HR 60 | Ht 65.0 in | Wt 235.8 lb

## 2016-11-25 DIAGNOSIS — I481 Persistent atrial fibrillation: Secondary | ICD-10-CM | POA: Diagnosis not present

## 2016-11-25 DIAGNOSIS — I4819 Other persistent atrial fibrillation: Secondary | ICD-10-CM

## 2016-11-25 MED ORDER — FLECAINIDE ACETATE 100 MG PO TABS: 100.0000 mg | ORAL_TABLET | Freq: Two times a day (BID) | ORAL | 0 refills | Status: DC

## 2016-11-25 MED ORDER — FLECAINIDE ACETATE 100 MG PO TABS: 100.0000 mg | ORAL_TABLET | Freq: Two times a day (BID) | ORAL | 2 refills | Status: DC

## 2016-11-25 NOTE — Addendum Note (Signed)
Addended by: Stanton Kidney on: 11/25/2016 11:06 AM   Modules accepted: Orders

## 2016-11-25 NOTE — Patient Instructions (Signed)
Medication Instructions:    Your physician has recommended you make the following change in your medication:  1) INCREASE Flecainide to 100 mg twice a day  --- If you need a refill on your cardiac medications before your next appointment, please call your pharmacy. ---  Labwork:  None ordered  Testing/Procedures:  None ordered  Follow-Up:  Your physician wants you to follow-up in: 6 months with Dr. Curt Bears.  You will receive a reminder letter in the mail two months in advance. If you don't receive a letter, please call our office to schedule the follow-up appointment.  Thank you for choosing [facility]!!   Trinidad Curet, RN [phone]
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
## 2016-12-13 DIAGNOSIS — M170 Bilateral primary osteoarthritis of knee: Secondary | ICD-10-CM | POA: Diagnosis not present

## 2016-12-13 DIAGNOSIS — M47812 Spondylosis without myelopathy or radiculopathy, cervical region: Secondary | ICD-10-CM | POA: Diagnosis not present

## 2016-12-13 DIAGNOSIS — G894 Chronic pain syndrome: Secondary | ICD-10-CM | POA: Diagnosis not present

## 2016-12-13 DIAGNOSIS — M47817 Spondylosis without myelopathy or radiculopathy, lumbosacral region: Secondary | ICD-10-CM | POA: Diagnosis not present

## 2016-12-14 ENCOUNTER — Other Ambulatory Visit: Payer: Self-pay | Admitting: Family Medicine

## 2016-12-17 ENCOUNTER — Ambulatory Visit (INDEPENDENT_AMBULATORY_CARE_PROVIDER_SITE_OTHER): Payer: Medicare Other | Admitting: Family Medicine

## 2016-12-17 ENCOUNTER — Encounter: Payer: Self-pay | Admitting: Family Medicine

## 2016-12-17 VITALS — BP 110/76 | HR 74 | Temp 100.1°F | Ht 65.0 in | Wt 231.0 lb

## 2016-12-17 DIAGNOSIS — J111 Influenza due to unidentified influenza virus with other respiratory manifestations: Secondary | ICD-10-CM

## 2016-12-17 MED ORDER — OSELTAMIVIR PHOSPHATE 75 MG PO CAPS
75.0000 mg | ORAL_CAPSULE | Freq: Two times a day (BID) | ORAL | 0 refills | Status: DC
Start: 2016-12-17 — End: 2016-12-22

## 2016-12-17 NOTE — Progress Notes (Signed)
   Subjective:    Patient ID: Nicole Norton, female    DOB: 02-16-1941, 76 y.o.   MRN: [id number]  HPI  4 days of cough with thick white mucous. Tried mucinex, Nyquail and Aspirin. + bodyaches.  Highest fever 104.  She denies any significant sore throat. She has had a runny nose as well as thick nasal congestion. Last temperature was this morning around 101. No GI symptoms. Took 2 days of doxycycline.    Review of Systems     Objective:   Physical Exam  Constitutional: She is oriented to person, place, and time. She appears well-developed and well-nourished.  HENT:  Head: Normocephalic and atraumatic.  Right Ear: External ear normal.  Left Ear: External ear normal.  Nose: Nose normal.  Mouth/Throat: Oropharynx is clear and moist.  TMs and canals are clear.   Eyes: Conjunctivae and EOM are normal. Pupils are equal, round, and reactive to light.  Neck: Neck supple. No thyromegaly present.  Cardiovascular: Normal rate, regular rhythm and normal heart sounds.   Pulmonary/Chest: Effort normal and breath sounds normal. She has no wheezes.  Lymphadenopathy:    She has no cervical adenopathy.  Neurological: She is alert and oriented to person, place, and time.  Skin: Skin is warm and dry.  Psychiatric: She has a normal mood and affect.        Assessment & Plan:  Influenza - Diagnosis based on symptoms of fever, cough, runny nose. Start Tamiflu. She is a little past 72 hours but I would consider her high risk so we'll go ahead and treat. All if not better in one week. Any symptomatic care.

## 2016-12-17 NOTE — Patient Instructions (Addendum)

## 2016-12-22 ENCOUNTER — Telehealth: Payer: Self-pay | Admitting: *Deleted

## 2016-12-22 NOTE — Telephone Encounter (Signed)
Pt called and lvm stating that she finished the tamiflu and now has a raspy cough and congestion. She wanted to know if she should come in to be seen or if Dr. [name] would send in something else for her to take.Nicole Norton ]

## 2016-12-22 NOTE — Telephone Encounter (Signed)
Called pt  Back and asked her about her sxs. She denies any fever, she stated that it feels like she has a weight on her chest, she has trouble breathing. She has no mucus, unable to eat but she is able to keep liquids down. I advised her to come in to be seen. appt made for tomorrow at 10.Nicole Norton [place]

## 2016-12-23 ENCOUNTER — Ambulatory Visit: Payer: Self-pay | Admitting: Family Medicine

## 2016-12-24 ENCOUNTER — Other Ambulatory Visit: Payer: Self-pay | Admitting: Family Medicine

## 2016-12-28 ENCOUNTER — Ambulatory Visit: Payer: Medicare Other | Admitting: Cardiovascular Disease

## 2017-01-03 ENCOUNTER — Ambulatory Visit (INDEPENDENT_AMBULATORY_CARE_PROVIDER_SITE_OTHER): Payer: Medicare Other

## 2017-01-03 ENCOUNTER — Ambulatory Visit (INDEPENDENT_AMBULATORY_CARE_PROVIDER_SITE_OTHER): Payer: Medicare Other | Admitting: Family Medicine

## 2017-01-03 ENCOUNTER — Encounter: Payer: Self-pay | Admitting: Family Medicine

## 2017-01-03 ENCOUNTER — Other Ambulatory Visit: Payer: Self-pay | Admitting: Family Medicine

## 2017-01-03 VITALS — BP 140/84 | HR 69 | Temp 98.1°F | Ht 65.0 in | Wt 234.0 lb

## 2017-01-03 DIAGNOSIS — R05 Cough: Secondary | ICD-10-CM

## 2017-01-03 DIAGNOSIS — R509 Fever, unspecified: Secondary | ICD-10-CM

## 2017-01-03 DIAGNOSIS — D510 Vitamin B12 deficiency anemia due to intrinsic factor deficiency: Secondary | ICD-10-CM

## 2017-01-03 DIAGNOSIS — R059 Cough, unspecified: Secondary | ICD-10-CM

## 2017-01-03 MED ORDER — CYANOCOBALAMIN 1000 MCG/ML IJ SOLN
1000.0000 ug | Freq: Once | INTRAMUSCULAR | Status: AC
  Administered 2017-01-03: 1000 ug via INTRAMUSCULAR

## 2017-01-03 NOTE — Progress Notes (Signed)
   Subjective:    Patient ID: Nicole Norton, female    DOB: 1941/05/15, 76 y.o.   MRN: [id number]  HPI 54 old female comes in today to follow-up for persistent symptoms. She was diagnosed with influenza on February 2. She actually called last week because she still had congestion and cough and have made an appointment but then canceled. She now complains of persistent cough and congestion. Though she does feel like it's better than she was when she had the flu. She has noticed some wheezing this week and low-grade fevers up to 99.7. She's also has persistent thick sinus drainage. She's been sleeping a lot more than normal and still just feels tired and weak at times. Her appetite is better than it was but she says is still not quite back to normal and she feels nauseated intermittently. She was recently started on Amitiza for opioid constipation. And also recently had a lot of was switch back to oxycodone for pain control.   Review of Systems     Objective:   Physical Exam  Constitutional: She is oriented to person, place, and time. She appears well-developed and well-nourished.  HENT:  Head: Normocephalic and atraumatic.  Right Ear: External ear normal.  Left Ear: External ear normal.  Nose: Nose normal.  Mouth/Throat: Oropharynx is clear and moist.  TMs and canals are clear.   Eyes: Conjunctivae and EOM are normal. Pupils are equal, round, and reactive to light.  Neck: Neck supple. No thyromegaly present.  Cardiovascular: Normal rate, regular rhythm and normal heart sounds.   Pulmonary/Chest: Effort normal and breath sounds normal. She has no wheezes.  Lymphadenopathy:    She has no cervical adenopathy.  Neurological: She is alert and oriented to person, place, and time.  Skin: Skin is warm and dry.  Psychiatric: She has a normal mood and affect.        Assessment & Plan:  Cough - Will get chest x-ray just to rule out pneumonia as a complication of influenza. Will give her a B12  injection. She says that this normally helps her when she is just struggling to get over an illness. Continue to hydrate and symptomatic care.

## 2017-01-03 NOTE — Patient Instructions (Signed)
Call when you think are ready to schedule your colonoscopy and we can get you scheduled.

## 2017-01-04 ENCOUNTER — Telehealth: Payer: Self-pay | Admitting: *Deleted

## 2017-01-04 ENCOUNTER — Other Ambulatory Visit: Payer: Self-pay | Admitting: *Deleted

## 2017-01-04 DIAGNOSIS — I10 Essential (primary) hypertension: Secondary | ICD-10-CM

## 2017-01-04 DIAGNOSIS — M109 Gout, unspecified: Secondary | ICD-10-CM

## 2017-01-04 DIAGNOSIS — E039 Hypothyroidism, unspecified: Secondary | ICD-10-CM

## 2017-01-04 DIAGNOSIS — E785 Hyperlipidemia, unspecified: Secondary | ICD-10-CM

## 2017-01-04 MED ORDER — PREDNISONE 20 MG PO TABS: 40.0000 mg | ORAL_TABLET | Freq: Every day | ORAL | 0 refills | Status: DC

## 2017-01-04 NOTE — Telephone Encounter (Signed)
Labs faxed.Nicole Norton [place]  

## 2017-01-07 DIAGNOSIS — E785 Hyperlipidemia, unspecified: Secondary | ICD-10-CM | POA: Diagnosis not present

## 2017-01-07 DIAGNOSIS — I10 Essential (primary) hypertension: Secondary | ICD-10-CM | POA: Diagnosis not present

## 2017-01-07 DIAGNOSIS — E039 Hypothyroidism, unspecified: Secondary | ICD-10-CM | POA: Diagnosis not present

## 2017-01-07 LAB — LIPID PANEL W/REFLEX DIRECT LDL
CHOL/HDL RATIO: 3.3 ratio (ref <5.0)
CHOLESTEROL: 247 mg/dL — AB (ref <200)
HDL: 75 mg/dL (ref >50)
LDL-Cholesterol: 151 mg/dL — ABNORMAL HIGH
Non-HDL Cholesterol (Calc): 172 mg/dL — ABNORMAL HIGH (ref <130)
Triglycerides: 101 mg/dL (ref <150)

## 2017-01-07 LAB — COMPLETE METABOLIC PANEL WITH GFR
ALBUMIN: 3.9 g/dL (ref 3.6–5.1)
ALK PHOS: 70 U/L (ref 33–130)
ALT: 9 U/L (ref 6–29)
AST: 14 U/L (ref 10–35)
BILIRUBIN TOTAL: 0.4 mg/dL (ref 0.2–1.2)
BUN: 22 mg/dL (ref 7–25)
CALCIUM: 9.1 mg/dL (ref 8.6–10.4)
CHLORIDE: 108 mmol/L (ref 98–110)
CO2: 28 mmol/L (ref 20–31)
CREATININE: 0.91 mg/dL (ref 0.60–0.93)
GFR, EST AFRICAN AMERICAN: 72 mL/min (ref >=60)
GFR, Est Non African American: 63 mL/min (ref >=60)
Glucose, Bld: 86 mg/dL (ref 65–99)
Potassium: 4.6 mmol/L (ref 3.5–5.3)
Sodium: 143 mmol/L (ref 135–146)
Total Protein: 6.5 g/dL (ref 6.1–8.1)

## 2017-01-07 LAB — TSH: TSH: 0.16 m[IU]/L — AB

## 2017-01-10 DIAGNOSIS — M170 Bilateral primary osteoarthritis of knee: Secondary | ICD-10-CM | POA: Diagnosis not present

## 2017-01-10 DIAGNOSIS — M47817 Spondylosis without myelopathy or radiculopathy, lumbosacral region: Secondary | ICD-10-CM | POA: Diagnosis not present

## 2017-01-10 DIAGNOSIS — M47812 Spondylosis without myelopathy or radiculopathy, cervical region: Secondary | ICD-10-CM | POA: Diagnosis not present

## 2017-01-10 DIAGNOSIS — G894 Chronic pain syndrome: Secondary | ICD-10-CM | POA: Diagnosis not present

## 2017-01-11 ENCOUNTER — Other Ambulatory Visit: Payer: Self-pay | Admitting: *Deleted

## 2017-01-11 DIAGNOSIS — E039 Hypothyroidism, unspecified: Secondary | ICD-10-CM

## 2017-01-11 DIAGNOSIS — M109 Gout, unspecified: Secondary | ICD-10-CM

## 2017-01-12 ENCOUNTER — Ambulatory Visit: Payer: Medicare Other | Admitting: Cardiovascular Disease

## 2017-01-14 ENCOUNTER — Other Ambulatory Visit: Payer: Self-pay | Admitting: Family Medicine

## 2017-01-17 ENCOUNTER — Other Ambulatory Visit: Payer: Self-pay

## 2017-01-17 MED ORDER — LEVOTHYROXINE SODIUM 25 MCG PO TABS: 25.0000 ug | ORAL_TABLET | Freq: Every day | ORAL | 1 refills | Status: DC

## 2017-01-19 ENCOUNTER — Encounter: Payer: Self-pay | Admitting: Cardiovascular Disease

## 2017-01-19 ENCOUNTER — Ambulatory Visit (INDEPENDENT_AMBULATORY_CARE_PROVIDER_SITE_OTHER): Payer: Medicare Other | Admitting: Cardiovascular Disease

## 2017-01-19 DIAGNOSIS — I480 Paroxysmal atrial fibrillation: Secondary | ICD-10-CM

## 2017-01-19 DIAGNOSIS — E785 Hyperlipidemia, unspecified: Secondary | ICD-10-CM

## 2017-01-19 DIAGNOSIS — I10 Essential (primary) hypertension: Secondary | ICD-10-CM

## 2017-01-19 NOTE — Assessment & Plan Note (Signed)
History of hypertension blood pressure initially 156/84. She is on amlodipine and carvedilol as well as Benicar. Continue current meds at current dosing

## 2017-01-19 NOTE — Progress Notes (Signed)
01/19/2017 Nicole Norton   Nov 14, 1941  [id number]  Primary Physician Trujillo Alto, MD Primary Cardiologist: Lorretta Harp MD Renae Gloss  HPI:  [name] is a 76 year old mildly overweight married Caucasian female with no children who I have taken care of several years ago and have not seen her back since. Her  husband [name] was also a patient of mine. I last saw her in the office 06/09/16 . She has a history of hypertension, hyperlipidemia and an arrhythmia which I diagnosed 10-15 years ago. She has never had a heart attack or stroke. She's had multiple stress test off which have been normal as well as 2-D echocardiograms and event monitors. She is statin intolerant. She denies chest pain or shortness of breath. She recently was diagnosed with gout as well as osteoarthritis arthritis. [name] She was placed on gout medicines with resulted in exacerbation of her arrhythmia. Once these medicines were discontinued her arrhythmia returned to baseline. An event monitor showed episodes of paroxysmal atrial fibrillation. A routine GXT was normal. I referred her to Dr. Curt Bears , electrophysiology, who placed her on flecainide and Xarelto. Her A. fib has markedly improved as has her blood pressure control.   Current Outpatient Prescriptions  Medication Sig Dispense Refill  . AMITIZA 24 MCG capsule     . amLODipine (NORVASC) 10 MG tablet Take 1 tablet (10 mg total) by mouth daily. 90 tablet 1  . carvedilol (COREG) 12.5 MG tablet Take 1 tablet (12.5 mg total) by mouth 2 (two) times daily. 180 tablet 1  . cetirizine (ZYRTEC) 10 MG tablet Take 10 mg by mouth as needed.      . clobetasol cream (TEMOVATE) 0.05 % Apply topically daily as needed. 60 g 0  . estrogens, conjugated, (PREMARIN) 0.625 MG tablet Take 0.625 mg by mouth 2 (two) times a week. Take daily for 21 days then do not take for 7 days.    . flecainide (TAMBOCOR) 100 MG tablet Take 1 tablet (100 mg total) by mouth 2 (two)  times daily. 30 tablet 0  . lansoprazole (PREVACID) 30 MG capsule TAKE 1 CAPSULE DAILY 90 capsule 3  . levothyroxine (SYNTHROID, LEVOTHROID) 200 MCG tablet TAKE ONE TABLET BY MOUTH EVERY DAY BEFORE BREAKFAST 90 tablet 0  . levothyroxine (SYNTHROID, LEVOTHROID) 25 MCG tablet Take 1 tablet (25 mcg total) by mouth daily before breakfast. 90 tablet 1  . Misc Natural Products (GLUCOSAMINE CHONDROITIN ADV PO) Take 1 tablet by mouth daily.    . Multiple Vitamins-Minerals (CENTRUM SILVER PO) Take by mouth daily.      [name] olmesartan (BENICAR) 40 MG tablet Take 1 tablet (40 mg total) by mouth daily. 90 tablet 1  . Omega-3 Fatty Acids (FISH OIL) 1200 MG CAPS Take by mouth daily.      . Oxycodone HCl 10 MG TABS Take 10 mg by mouth 4 (four) times daily.    . predniSONE (DELTASONE) 20 MG tablet Take 40 mg by mouth as needed.    . pregabalin (LYRICA) 75 MG capsule Take 75 mg by mouth 3 (three) times daily.    . rivaroxaban (XARELTO) 20 MG TABS tablet Take 1 tablet (20 mg total) by mouth daily with supper. 90 tablet 3  . sucralfate (CARAFATE) 1 GM/10ML suspension Take by mouth as needed. Take 10 mL by mouth 4 times daily as needed with meals.    . vitamin B-12 (CYANOCOBALAMIN) 500 MCG tablet Take 500 mcg by mouth. 3 WEEKLY  No current facility-administered medications for this visit.     Allergies  Allergen Reactions  . Allopurinol Palpitations  . Uloric [Febuxostat] Other (See Comments)    Episodes of AFIB  . Colchicine Other (See Comments)    palpitations  . Cymbalta [Duloxetine Hcl] Other (See Comments)    palpitations  . Etodolac Other (See Comments)    heart flutters and headaches   . Penicillins   . Solu-Medrol [Methylprednisolone Acetate]     Heart palpitations/bad headache  . Statins     REACTION: myalgias    Social History   Social History  . Marital status: Married    Spouse name: N/A  . Number of children: N/A  . Years of education: N/A   Occupational History  . Not on file.     Social History Main Topics  . Smoking status: Former Smoker    Quit date: 11/16/1999  . Smokeless tobacco: Never Used  . Alcohol use 3.6 oz/week    6 Glasses of wine per week  . Drug use: No  . Sexual activity: Not on file   Other Topics Concern  . Not on file   Social History Narrative  . No narrative on file     Review of Systems: General: negative for chills, fever, night sweats or weight changes.  Cardiovascular: negative for chest pain, dyspnea on exertion, edema, orthopnea, palpitations, paroxysmal nocturnal dyspnea or shortness of breath Dermatological: negative for rash Respiratory: negative for cough or wheezing Urologic: negative for hematuria Abdominal: negative for nausea, vomiting, diarrhea, bright red blood per rectum, melena, or hematemesis Neurologic: negative for visual changes, syncope, or dizziness All other systems reviewed and are otherwise negative except as noted above.    Blood pressure (!) 156/84, pulse 60, height 5\' 5"  (1.651 m), weight 233 lb (105.7 kg).  General appearance: alert and no distress Neck: no adenopathy, no carotid bruit, no JVD, supple, symmetrical, trachea midline and thyroid not enlarged, symmetric, no tenderness/mass/nodules Lungs: clear to auscultation bilaterally Heart: regular rate and rhythm, S1, S2 normal, no murmur, click, rub or gallop Extremities: extremities normal, atraumatic, no cyanosis or edema  EKG not performed today  ASSESSMENT AND PLAN:   Hyperlipidemia LDL goal <160 History of hyperlipidemia not on statin therapy with lipid profile performed 01/07/17 revealed total cholesterol 247  intolerant to statin therapy.  ESSENTIAL HYPERTENSION, BENIGN History of hypertension blood pressure initially 156/84. She is on amlodipine and carvedilol as well as Benicar. Continue current meds at current dosing  Cardiac arrhythmia History of paroxysmal atrial fibrillation maintaining sinus rhythm on flecainide and Xarelto.        Lorretta Harp MD FACP,FACC,FAHA, [facility] 01/19/2017 11:41 AM

## 2017-01-19 NOTE — Patient Instructions (Signed)

## 2017-01-19 NOTE — Assessment & Plan Note (Signed)
History of hyperlipidemia not on statin therapy with lipid profile performed 01/07/17 revealed total cholesterol 247  intolerant to statin therapy.

## 2017-01-19 NOTE — Assessment & Plan Note (Signed)
History of paroxysmal atrial fibrillation maintaining sinus rhythm on flecainide and Xarelto.

## 2017-02-08 DIAGNOSIS — M170 Bilateral primary osteoarthritis of knee: Secondary | ICD-10-CM | POA: Diagnosis not present

## 2017-02-08 DIAGNOSIS — G894 Chronic pain syndrome: Secondary | ICD-10-CM | POA: Diagnosis not present

## 2017-02-08 DIAGNOSIS — M47812 Spondylosis without myelopathy or radiculopathy, cervical region: Secondary | ICD-10-CM | POA: Diagnosis not present

## 2017-02-08 DIAGNOSIS — M47817 Spondylosis without myelopathy or radiculopathy, lumbosacral region: Secondary | ICD-10-CM | POA: Diagnosis not present

## 2017-02-15 DIAGNOSIS — E039 Hypothyroidism, unspecified: Secondary | ICD-10-CM | POA: Diagnosis not present

## 2017-02-15 DIAGNOSIS — M109 Gout, unspecified: Secondary | ICD-10-CM | POA: Diagnosis not present

## 2017-02-15 LAB — URIC ACID: URIC ACID, SERUM: 6.1 mg/dL (ref 2.5–7.0)

## 2017-02-15 LAB — TSH: TSH: 0.47 m[IU]/L
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
## 2017-02-22 ENCOUNTER — Encounter: Payer: Self-pay | Admitting: Family Medicine

## 2017-02-22 ENCOUNTER — Ambulatory Visit (INDEPENDENT_AMBULATORY_CARE_PROVIDER_SITE_OTHER): Payer: Medicare Other | Admitting: Family Medicine

## 2017-02-22 VITALS — BP 130/80 | Wt 226.0 lb

## 2017-02-22 DIAGNOSIS — R05 Cough: Secondary | ICD-10-CM

## 2017-02-22 DIAGNOSIS — R059 Cough, unspecified: Secondary | ICD-10-CM

## 2017-02-22 DIAGNOSIS — R5383 Other fatigue: Secondary | ICD-10-CM | POA: Diagnosis not present

## 2017-02-22 DIAGNOSIS — R509 Fever, unspecified: Secondary | ICD-10-CM | POA: Diagnosis not present

## 2017-02-22 LAB — CBC WITH DIFFERENTIAL/PLATELET
Basophils Absolute: 85 cells/uL (ref 0–200)
Basophils Relative: 1 %
EOS ABS: 510 {cells}/uL — AB (ref 15–500)
Eosinophils Relative: 6 %
HEMATOCRIT: 32.2 % — AB (ref 35.0–45.0)
HEMOGLOBIN: 10.2 g/dL — AB (ref 11.7–15.5)
LYMPHS ABS: 1615 {cells}/uL (ref 850–3900)
Lymphocytes Relative: 19 %
MCH: 26.7 pg — ABNORMAL LOW (ref 27.0–33.0)
MCHC: 31.7 g/dL — AB (ref 32.0–36.0)
MCV: 84.3 fL (ref 80.0–100.0)
MONO ABS: 680 {cells}/uL (ref 200–950)
MPV: 10.8 fL (ref 7.5–12.5)
Monocytes Relative: 8 %
NEUTROS PCT: 66 %
Neutro Abs: 5610 cells/uL (ref 1500–7800)
Platelets: 322 10*3/uL (ref 140–400)
RBC: 3.82 MIL/uL (ref 3.80–5.10)
RDW: 16 % — ABNORMAL HIGH (ref 11.0–15.0)
WBC: 8.5 10*3/uL (ref 3.8–10.8)

## 2017-02-22 MED ORDER — CEFDINIR 300 MG PO CAPS: 300.0000 mg | ORAL_CAPSULE | Freq: Two times a day (BID) | ORAL | 0 refills | Status: DC

## 2017-02-22 NOTE — Patient Instructions (Signed)
Call back if not better in one week.

## 2017-02-23 ENCOUNTER — Telehealth: Payer: Self-pay | Admitting: *Deleted

## 2017-02-23 NOTE — Addendum Note (Signed)
Addended by: Beatrice Lecher D on: 02/23/2017 02:58 PM   Modules accepted: Orders

## 2017-02-23 NOTE — Telephone Encounter (Signed)
close

## 2017-03-03 ENCOUNTER — Ambulatory Visit (INDEPENDENT_AMBULATORY_CARE_PROVIDER_SITE_OTHER): Payer: Medicare Other

## 2017-03-03 DIAGNOSIS — M5134 Other intervertebral disc degeneration, thoracic region: Secondary | ICD-10-CM | POA: Diagnosis not present

## 2017-03-03 DIAGNOSIS — R509 Fever, unspecified: Secondary | ICD-10-CM

## 2017-03-03 DIAGNOSIS — R918 Other nonspecific abnormal finding of lung field: Secondary | ICD-10-CM | POA: Diagnosis not present

## 2017-03-03 DIAGNOSIS — R059 Cough, unspecified: Secondary | ICD-10-CM

## 2017-03-03 DIAGNOSIS — R5383 Other fatigue: Secondary | ICD-10-CM

## 2017-03-03 DIAGNOSIS — I700 Atherosclerosis of aorta: Secondary | ICD-10-CM | POA: Diagnosis not present

## 2017-03-03 DIAGNOSIS — R05 Cough: Secondary | ICD-10-CM

## 2017-03-04 DIAGNOSIS — I700 Atherosclerosis of aorta: Secondary | ICD-10-CM | POA: Insufficient documentation

## 2017-03-08 DIAGNOSIS — M47817 Spondylosis without myelopathy or radiculopathy, lumbosacral region: Secondary | ICD-10-CM | POA: Diagnosis not present

## 2017-03-08 DIAGNOSIS — M170 Bilateral primary osteoarthritis of knee: Secondary | ICD-10-CM | POA: Diagnosis not present

## 2017-03-08 DIAGNOSIS — G894 Chronic pain syndrome: Secondary | ICD-10-CM | POA: Diagnosis not present

## 2017-03-08 DIAGNOSIS — M47812 Spondylosis without myelopathy or radiculopathy, cervical region: Secondary | ICD-10-CM | POA: Diagnosis not present

## 2017-03-10 ENCOUNTER — Ambulatory Visit (INDEPENDENT_AMBULATORY_CARE_PROVIDER_SITE_OTHER): Payer: Medicare Other | Admitting: Family Medicine

## 2017-03-10 VITALS — BP 128/74 | HR 89 | Temp 98.4°F | Wt 229.0 lb

## 2017-03-10 DIAGNOSIS — R0602 Shortness of breath: Secondary | ICD-10-CM

## 2017-03-10 DIAGNOSIS — J984 Other disorders of lung: Secondary | ICD-10-CM | POA: Diagnosis not present

## 2017-03-10 DIAGNOSIS — R062 Wheezing: Secondary | ICD-10-CM

## 2017-03-10 MED ORDER — ALBUTEROL SULFATE (2.5 MG/3ML) 0.083% IN NEBU
2.5000 mg | INHALATION_SOLUTION | Freq: Once | RESPIRATORY_TRACT | Status: AC
  Administered 2017-03-10: 2.5 mg via RESPIRATORY_TRACT

## 2017-03-10 MED ORDER — IPRATROPIUM-ALBUTEROL 20-100 MCG/ACT IN AERS: 1.0000 | INHALATION_SPRAY | Freq: Four times a day (QID) | RESPIRATORY_TRACT | 1 refills | Status: DC

## 2017-03-10 NOTE — Progress Notes (Signed)
   Subjective:    Patient ID: Nicole Norton, female    DOB: 02-01-1941, 76 y.o.   MRN: [id number]  HPI 76 year old female comes in today because she has had upper respiratory symptoms going on since January. She came in more recently about 2-1/2 weeks ago because she was still having cough and persistent fevers. She had a negative chest x-ray in February so we decided to go ahead and move forward with chest CT. It did show scattered areas of interstitial reticulation and groundglass attenuation consistent with postinfectious or inflammatory changes. No sign of acute pneumonia and she also was noted to have some small nodules. She was still feeling some wheezing and feeling symptomatically asked her come back in for spirometry today.   Review of Systems     Objective:   Physical Exam  Constitutional: She is oriented to person, place, and time. She appears well-developed and well-nourished.  HENT:  Head: Normocephalic and atraumatic.  Cardiovascular: Normal rate, regular rhythm and normal heart sounds.   Pulmonary/Chest: Effort normal and breath sounds normal.  Neurological: She is alert and oriented to person, place, and time.  Skin: Skin is warm and dry.  Psychiatric: She has a normal mood and affect. Her behavior is normal.          Assessment & Plan:   Cough/wheezing-Spirometry is consistent with restrictive lung disease. I do think she still in the healing phase based on recent chest CT. We reviewed the results together with her husband here as well.  Spirometry results reveal FVC of 56%, FEV1 of 62% with a ratio of 82%. She had no significant improvement after albuterol treatment.  We discussed options including referral to pulmonology for further consultation and probably full PFTs. Since she still feels like she is in the healing phase and still probably be another month before she is back to baseline we opted to just continue to monitor and then consider rechecking spirometry in  about a month. She is no prior history of lung disease. Though she does report that over her lifetime she's been very susceptible to pneumonia and lung infections even from a time she was a child.

## 2017-03-12 ENCOUNTER — Other Ambulatory Visit: Payer: Self-pay | Admitting: Family Medicine

## 2017-03-12 ENCOUNTER — Other Ambulatory Visit: Payer: Self-pay | Admitting: Cardiovascular Disease

## 2017-03-14 NOTE — Telephone Encounter (Signed)
Drop in hgb noted. Currently f/u with MD. No acute bleeding noted

## 2017-03-21 ENCOUNTER — Other Ambulatory Visit: Payer: Self-pay | Admitting: Family Medicine

## 2017-03-24 ENCOUNTER — Ambulatory Visit (INDEPENDENT_AMBULATORY_CARE_PROVIDER_SITE_OTHER): Payer: Medicare Other | Admitting: Family Medicine

## 2017-03-24 ENCOUNTER — Encounter: Payer: Self-pay | Admitting: Family Medicine

## 2017-03-24 VITALS — BP 120/74 | HR 56 | Ht 65.0 in | Wt 230.0 lb

## 2017-03-24 DIAGNOSIS — J984 Other disorders of lung: Secondary | ICD-10-CM | POA: Diagnosis not present

## 2017-03-24 DIAGNOSIS — R0902 Hypoxemia: Secondary | ICD-10-CM

## 2017-03-24 MED ORDER — AMBULATORY NON FORMULARY MEDICATION: 0 refills | Status: DC
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
## 2017-03-24 NOTE — Progress Notes (Signed)
6-min walk done  Without O2 @ rest: 92 Without O2 during exercise: 88 With O2 during exercise: 97  .Nicole Norton

## 2017-03-30 DIAGNOSIS — J984 Other disorders of lung: Secondary | ICD-10-CM | POA: Diagnosis not present

## 2017-03-30 DIAGNOSIS — R0902 Hypoxemia: Secondary | ICD-10-CM | POA: Diagnosis not present

## 2017-04-01 ENCOUNTER — Telehealth: Payer: Self-pay | Admitting: Family Medicine

## 2017-04-01 DIAGNOSIS — J984 Other disorders of lung: Secondary | ICD-10-CM

## 2017-04-01 DIAGNOSIS — G4734 Idiopathic sleep related nonobstructive alveolar hypoventilation: Secondary | ICD-10-CM

## 2017-04-01 MED ORDER — AMBULATORY NON FORMULARY MEDICATION: 0 refills | Status: DC

## 2017-04-01 NOTE — Telephone Encounter (Signed)
Ordered printed and faxecxd.

## 2017-04-01 NOTE — Telephone Encounter (Signed)
Pt advised, she would like oxygen order to go to [facility]. Will fax order.  Pt would like to wait on sleep study. Pt states she is going to see a pulmonologist in a month and depending on what they find she may revisit the idea of a sleep study.

## 2017-04-01 NOTE — Telephone Encounter (Signed)
Call patient: I did receive her overnight pulse oximetry results from [facility]. Her average oxygen level was around 86%. Her lowest oxygen level was 61%. Average pulse was around 65 bpm. She does qualify for overnight oxygen. I'm going to move forward with getting this ordered. If she has a preference for which company to get her supplies through them please let us know. She may want to use the same company that her husband uses for his oxygen supplies. We can then get them to do a repeat pulse ox on the 2 L of oxygen after one to 2 weeks to make sure that that is adequate.  I would also like to get her tested for sleep apnea.    Beatrice Lecher, MD

## 2017-04-02 ENCOUNTER — Other Ambulatory Visit: Payer: Self-pay | Admitting: Cardiology

## 2017-04-05 DIAGNOSIS — M47812 Spondylosis without myelopathy or radiculopathy, cervical region: Secondary | ICD-10-CM | POA: Diagnosis not present

## 2017-04-05 DIAGNOSIS — M170 Bilateral primary osteoarthritis of knee: Secondary | ICD-10-CM | POA: Diagnosis not present

## 2017-04-05 DIAGNOSIS — G894 Chronic pain syndrome: Secondary | ICD-10-CM | POA: Diagnosis not present

## 2017-04-05 DIAGNOSIS — M47817 Spondylosis without myelopathy or radiculopathy, lumbosacral region: Secondary | ICD-10-CM | POA: Diagnosis not present

## 2017-04-18 ENCOUNTER — Other Ambulatory Visit: Payer: Self-pay | Admitting: Family Medicine

## 2017-05-05 ENCOUNTER — Ambulatory Visit (INDEPENDENT_AMBULATORY_CARE_PROVIDER_SITE_OTHER): Payer: Medicare Other | Admitting: Pulmonary Disease

## 2017-05-05 ENCOUNTER — Encounter: Payer: Self-pay | Admitting: Pulmonary Disease

## 2017-05-05 VITALS — BP 190/110 | HR 60 | Ht 65.0 in | Wt 233.0 lb

## 2017-05-05 DIAGNOSIS — G4734 Idiopathic sleep related nonobstructive alveolar hypoventilation: Secondary | ICD-10-CM | POA: Diagnosis not present

## 2017-05-05 DIAGNOSIS — R911 Solitary pulmonary nodule: Secondary | ICD-10-CM | POA: Diagnosis not present

## 2017-05-05 DIAGNOSIS — J9621 Acute and chronic respiratory failure with hypoxia: Secondary | ICD-10-CM

## 2017-05-05 DIAGNOSIS — I10 Essential (primary) hypertension: Secondary | ICD-10-CM | POA: Diagnosis not present

## 2017-05-05 DIAGNOSIS — R0600 Dyspnea, unspecified: Secondary | ICD-10-CM

## 2017-05-05 NOTE — Assessment & Plan Note (Signed)
This problem has resolved. However, I suspect that it unmasked some degree of baseline lung disease. Spirometry testing did not show hypoxemia but a CT scan of her chest did show mild fibrotic changes. Overall, there is no convincing evidence that she has an overwhelmingly severe lung disease nor is there evidence that she has anything that's going to progress. But I suppose she likely has some degree of mild fibrotic changes in her lungs likely related to prior smoking history.  Plan: Repeat high-resolution CT scan of the chest in April 2019 to make sure there is no evidence of progressive fibrosis and then follow-up with me
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
## 2017-05-05 NOTE — Assessment & Plan Note (Signed)
Multiple pulmonary nodules noted, largest 5 mm in a former smoker. These need to be followed. We will plan on a repeat high-resolution CT scan of the chest in one year.

## 2017-05-05 NOTE — Patient Instructions (Signed)
We will reorder an overnight oxygen test, do not sleep with oxygen when we perform this test We will order another CT scan for one year and see you after that If you develop any sort of change such as shortness of breath or worsening cough in the next year come back to see [place] sooner I will see you in April 2019

## 2017-05-05 NOTE — Assessment & Plan Note (Signed)
It's hard to know what to do with this. There is no data from the literature that suggests that asymptomatic patients will benefit from nocturnal oxygen supplementation. However, she did have quite a low value at 61% while she was ill. I suspect this level was so low because of what was happening with her lungs from the flu.  Plan: Repeat Overnight oximetry on room-air.

## 2017-05-05 NOTE — Assessment & Plan Note (Signed)
Significantly elevated today, she notes a history of whitecoat hypertension. She is asymptomatic. Recommended that she continue follow-up with cardiology and PCP.

## 2017-05-06 ENCOUNTER — Encounter: Payer: Self-pay | Admitting: Cardiology

## 2017-05-12 DIAGNOSIS — R0902 Hypoxemia: Secondary | ICD-10-CM | POA: Diagnosis not present

## 2017-05-12 DIAGNOSIS — J984 Other disorders of lung: Secondary | ICD-10-CM | POA: Diagnosis not present

## 2017-05-13 DIAGNOSIS — R0902 Hypoxemia: Secondary | ICD-10-CM | POA: Diagnosis not present

## 2017-05-13 DIAGNOSIS — J984 Other disorders of lung: Secondary | ICD-10-CM | POA: Diagnosis not present

## 2017-05-25 ENCOUNTER — Ambulatory Visit (INDEPENDENT_AMBULATORY_CARE_PROVIDER_SITE_OTHER): Payer: Medicare Other | Admitting: Cardiology

## 2017-05-25 ENCOUNTER — Encounter: Payer: Self-pay | Admitting: Cardiology

## 2017-05-25 ENCOUNTER — Telehealth: Payer: Self-pay | Admitting: Pulmonary Disease

## 2017-05-25 VITALS — BP 160/90 | HR 70 | Ht 66.0 in | Wt 227.6 lb

## 2017-05-25 DIAGNOSIS — G4734 Idiopathic sleep related nonobstructive alveolar hypoventilation: Secondary | ICD-10-CM

## 2017-05-25 DIAGNOSIS — I10 Essential (primary) hypertension: Secondary | ICD-10-CM | POA: Diagnosis not present

## 2017-05-25 DIAGNOSIS — I480 Paroxysmal atrial fibrillation: Secondary | ICD-10-CM

## 2017-05-25 MED ORDER — AMLODIPINE BESYLATE 10 MG PO TABS: 10.0000 mg | ORAL_TABLET | Freq: Every day | ORAL | 2 refills | Status: DC

## 2017-05-25 MED ORDER — RIVAROXABAN 20 MG PO TABS: ORAL_TABLET | ORAL | 2 refills | Status: DC

## 2017-05-25 MED ORDER — CARVEDILOL 25 MG PO TABS: 25.0000 mg | ORAL_TABLET | Freq: Two times a day (BID) | ORAL | 2 refills | Status: DC
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
## 2017-05-25 NOTE — Patient Instructions (Signed)
Medication Instructions:    Your physician has recommended you make the following change in your medication:  1) INCREASE Carvedilol to 25 mg twice a day   - If you need a refill on your cardiac medications before your next appointment, please call your pharmacy.   Labwork:  None ordered  Testing/Procedures:  None ordered  Follow-Up:  Your physician wants you to follow-up in: 6 months with Dr. Curt Bears.  You will receive a reminder letter in the mail two months in advance. If you don't receive a letter, please call our office to schedule the follow-up appointment.  Thank you for choosing [facility]!!   Trinidad Curet, RN [phone]

## 2017-05-25 NOTE — Telephone Encounter (Signed)
Pt is requesting ONO results. I have checked BQ's chubby, it does not appear that we have received results from [facility]. Pt became upset when learning that we have not received results, as she was told by Sam at [facility] that he would send results the next day per pt. That states this has almost been two weeks, as ONO was performed the week of July 4th.  Will hold message and contact [place] on 05/26/17, as it is after hours for [facility].

## 2017-05-26 NOTE — Telephone Encounter (Signed)
Called and spoke with Legrand Como at [facility]--he stated that the results of the ONO was faxed to BQ on 7/2.  I spoke with Ailene Ravel at [facility] and she is faxing this to the fax machine up front. Will hold in triage until fax is received.

## 2017-05-26 NOTE — Telephone Encounter (Signed)
ONO results have been received from [facility] and placed in BQ look at cubby.  Will forward to BQ to review results so we can call the pt with these results. Thanks

## 2017-05-27 NOTE — Telephone Encounter (Signed)
A,  Please let her know her ONO was normal.  Thanks  B --------------------------- Spoke with pt. She is aware of results. Pt is wanting [facility] to come pick up the oxygen concentrator that she currently has.  BQ - please advise if it's okay to discontinue oxygen. Thanks.

## 2017-05-31 ENCOUNTER — Telehealth: Payer: Self-pay

## 2017-05-31 DIAGNOSIS — M170 Bilateral primary osteoarthritis of knee: Secondary | ICD-10-CM | POA: Diagnosis not present

## 2017-05-31 DIAGNOSIS — M47817 Spondylosis without myelopathy or radiculopathy, lumbosacral region: Secondary | ICD-10-CM | POA: Diagnosis not present

## 2017-05-31 DIAGNOSIS — G894 Chronic pain syndrome: Secondary | ICD-10-CM | POA: Diagnosis not present

## 2017-05-31 DIAGNOSIS — M47812 Spondylosis without myelopathy or radiculopathy, cervical region: Secondary | ICD-10-CM | POA: Diagnosis not present

## 2017-05-31 NOTE — Telephone Encounter (Signed)
Patient called stated that she saw Dr. Lake Bells and was told that her Oxygen level is normal,( please see his note below)she is requesting that [facility] be called to remove the Oxygen generator from her home. Please advise which provider needs to discontinue the order. Nicole Norton   Telephone Open    05/25/2017 [facility]  Juanito Doom, MD  Pulmonary Disease   Results  Reason for call   Conversation: Results  (Newest Message First)  May 27, 2017        10:02 AM  Nicole Norton, CMA routed this conversation to Juanito Doom, MD  Nicole Norton, CMA      10:01 AM  Note    A,  Please let her know her ONO was normal.  Thanks  B --------------------------- Spoke with pt. She is aware of results. Pt is wanting [facility] to come pick up the oxygen concentrator that she currently has.  BQ - please advise if it's okay to discontinue oxygen. Thanks.     May 26, 2017        2:19 PM

## 2017-06-01 NOTE — Telephone Encounter (Signed)
OK by me 

## 2017-06-01 NOTE — Telephone Encounter (Signed)
BQ - please advise. Thanks. 

## 2017-06-02 NOTE — Telephone Encounter (Signed)
Order has been placed to discontinue oxygen use. Nothing further was needed.

## 2017-06-02 NOTE — Telephone Encounter (Signed)
OK to call [facility] and have them pick up oxygen

## 2017-06-02 NOTE — Telephone Encounter (Signed)
Dr. [name], please see note below. Rhonda Cunningham,CMA

## 2017-06-03 NOTE — Telephone Encounter (Signed)
Order has been faxed to [place]. Rhonda Cunningham,CMA

## 2017-06-09 DIAGNOSIS — H524 Presbyopia: Secondary | ICD-10-CM | POA: Diagnosis not present

## 2017-06-09 DIAGNOSIS — H5203 Hypermetropia, bilateral: Secondary | ICD-10-CM | POA: Diagnosis not present

## 2017-06-09 DIAGNOSIS — H52223 Regular astigmatism, bilateral: Secondary | ICD-10-CM | POA: Diagnosis not present

## 2017-06-09 DIAGNOSIS — H26491 Other secondary cataract, right eye: Secondary | ICD-10-CM | POA: Diagnosis not present

## 2017-06-09 DIAGNOSIS — H26492 Other secondary cataract, left eye: Secondary | ICD-10-CM | POA: Diagnosis not present

## 2017-06-09 DIAGNOSIS — H35443 Age-related reticular degeneration of retina, bilateral: Secondary | ICD-10-CM | POA: Diagnosis not present

## 2017-06-09 DIAGNOSIS — D3132 Benign neoplasm of left choroid: Secondary | ICD-10-CM | POA: Diagnosis not present

## 2017-06-09 DIAGNOSIS — H43813 Vitreous degeneration, bilateral: Secondary | ICD-10-CM | POA: Diagnosis not present

## 2017-06-09 DIAGNOSIS — Z961 Presence of intraocular lens: Secondary | ICD-10-CM | POA: Diagnosis not present

## 2017-06-13 ENCOUNTER — Other Ambulatory Visit: Payer: Self-pay | Admitting: Family Medicine

## 2017-06-13 DIAGNOSIS — Z1231 Encounter for screening mammogram for malignant neoplasm of breast: Secondary | ICD-10-CM

## 2017-06-30 DIAGNOSIS — M47812 Spondylosis without myelopathy or radiculopathy, cervical region: Secondary | ICD-10-CM | POA: Diagnosis not present

## 2017-06-30 DIAGNOSIS — M47817 Spondylosis without myelopathy or radiculopathy, lumbosacral region: Secondary | ICD-10-CM | POA: Diagnosis not present

## 2017-06-30 DIAGNOSIS — M170 Bilateral primary osteoarthritis of knee: Secondary | ICD-10-CM | POA: Diagnosis not present

## 2017-06-30 DIAGNOSIS — G894 Chronic pain syndrome: Secondary | ICD-10-CM | POA: Diagnosis not present

## 2017-07-04 ENCOUNTER — Other Ambulatory Visit: Payer: Self-pay | Admitting: Family Medicine

## 2017-07-07 ENCOUNTER — Ambulatory Visit: Payer: Self-pay

## 2017-07-07 DIAGNOSIS — M170 Bilateral primary osteoarthritis of knee: Secondary | ICD-10-CM | POA: Diagnosis not present

## 2017-07-13 ENCOUNTER — Ambulatory Visit
Admission: RE | Admit: 2017-07-13 | Discharge: 2017-07-13 | Disposition: A | Discharge status: Home / Self Care | Payer: Medicare Other | Source: Ambulatory Visit | Attending: Family Medicine | Admitting: Family Medicine

## 2017-07-13 DIAGNOSIS — Z1231 Encounter for screening mammogram for malignant neoplasm of breast: Secondary | ICD-10-CM | POA: Diagnosis not present

## 2017-07-14 DIAGNOSIS — M170 Bilateral primary osteoarthritis of knee: Secondary | ICD-10-CM | POA: Diagnosis not present

## 2017-07-29 DIAGNOSIS — M47817 Spondylosis without myelopathy or radiculopathy, lumbosacral region: Secondary | ICD-10-CM | POA: Diagnosis not present

## 2017-07-29 DIAGNOSIS — M189 Osteoarthritis of first carpometacarpal joint, unspecified: Secondary | ICD-10-CM | POA: Diagnosis not present

## 2017-07-29 DIAGNOSIS — K5903 Drug induced constipation: Secondary | ICD-10-CM | POA: Diagnosis not present

## 2017-07-29 DIAGNOSIS — M47812 Spondylosis without myelopathy or radiculopathy, cervical region: Secondary | ICD-10-CM | POA: Diagnosis not present

## 2017-07-29 DIAGNOSIS — G894 Chronic pain syndrome: Secondary | ICD-10-CM | POA: Diagnosis not present

## 2017-07-29 DIAGNOSIS — F419 Anxiety disorder, unspecified: Secondary | ICD-10-CM | POA: Diagnosis not present

## 2017-07-29 DIAGNOSIS — G4700 Insomnia, unspecified: Secondary | ICD-10-CM | POA: Diagnosis not present

## 2017-07-29 DIAGNOSIS — M109 Gout, unspecified: Secondary | ICD-10-CM | POA: Diagnosis not present

## 2017-07-29 DIAGNOSIS — M797 Fibromyalgia: Secondary | ICD-10-CM | POA: Diagnosis not present

## 2017-07-29 DIAGNOSIS — M170 Bilateral primary osteoarthritis of knee: Secondary | ICD-10-CM | POA: Diagnosis not present

## 2017-08-26 DIAGNOSIS — M170 Bilateral primary osteoarthritis of knee: Secondary | ICD-10-CM | POA: Diagnosis not present

## 2017-08-26 DIAGNOSIS — M47812 Spondylosis without myelopathy or radiculopathy, cervical region: Secondary | ICD-10-CM | POA: Diagnosis not present

## 2017-08-26 DIAGNOSIS — M47817 Spondylosis without myelopathy or radiculopathy, lumbosacral region: Secondary | ICD-10-CM | POA: Diagnosis not present

## 2017-08-26 DIAGNOSIS — G894 Chronic pain syndrome: Secondary | ICD-10-CM | POA: Diagnosis not present

## 2017-09-22 DIAGNOSIS — D649 Anemia, unspecified: Secondary | ICD-10-CM | POA: Diagnosis not present

## 2017-09-22 DIAGNOSIS — R918 Other nonspecific abnormal finding of lung field: Secondary | ICD-10-CM | POA: Diagnosis not present

## 2017-09-22 DIAGNOSIS — R0689 Other abnormalities of breathing: Secondary | ICD-10-CM | POA: Diagnosis not present

## 2017-09-22 DIAGNOSIS — Z87891 Personal history of nicotine dependence: Secondary | ICD-10-CM | POA: Diagnosis not present

## 2017-09-22 DIAGNOSIS — D509 Iron deficiency anemia, unspecified: Secondary | ICD-10-CM | POA: Diagnosis not present

## 2017-09-22 DIAGNOSIS — R0602 Shortness of breath: Secondary | ICD-10-CM | POA: Diagnosis not present

## 2017-09-22 DIAGNOSIS — J984 Other disorders of lung: Secondary | ICD-10-CM | POA: Diagnosis not present

## 2017-09-22 DIAGNOSIS — J4530 Mild persistent asthma, uncomplicated: Secondary | ICD-10-CM | POA: Diagnosis not present

## 2017-09-23 ENCOUNTER — Telehealth: Payer: Self-pay

## 2017-09-23 DIAGNOSIS — M170 Bilateral primary osteoarthritis of knee: Secondary | ICD-10-CM | POA: Diagnosis not present

## 2017-09-23 DIAGNOSIS — G894 Chronic pain syndrome: Secondary | ICD-10-CM | POA: Diagnosis not present

## 2017-09-23 DIAGNOSIS — M47812 Spondylosis without myelopathy or radiculopathy, cervical region: Secondary | ICD-10-CM | POA: Diagnosis not present

## 2017-09-23 DIAGNOSIS — M47817 Spondylosis without myelopathy or radiculopathy, lumbosacral region: Secondary | ICD-10-CM | POA: Diagnosis not present

## 2017-09-23 NOTE — Telephone Encounter (Signed)
Pt advised and scheduled for Monday with PCP. She has no interest in a referral to GI at this time.

## 2017-09-23 NOTE — Telephone Encounter (Signed)
Call patient see if she would be willing to come in next week.  Her hemoglobin is dropped to 8.8 which is even lower than when we checked it back in the spring and it had dropped to 10.  I am very concerned that she could be losing blood in her colon.  I would like to at least do a rectal exam and test her stool for blood.  I know she is not interested in having a colonoscopy but this is concerning.  Plus she is on Xarelto which can increase her risk for bleeding.  The other option is we can at least get her in with GI.  They may be able to do some other testing besides an actual scope.

## 2017-09-23 NOTE — Telephone Encounter (Signed)
[facility] called and states during visit with patient some labs were ran. Patient has Anemia. See results in Care Everywhere.

## 2017-09-26 ENCOUNTER — Ambulatory Visit (INDEPENDENT_AMBULATORY_CARE_PROVIDER_SITE_OTHER): Payer: Medicare Other | Admitting: Family Medicine

## 2017-09-26 ENCOUNTER — Encounter: Payer: Self-pay | Admitting: Family Medicine

## 2017-09-26 ENCOUNTER — Ambulatory Visit: Payer: Medicare Other

## 2017-09-26 VITALS — BP 200/100 | HR 68 | Wt 239.0 lb

## 2017-09-26 DIAGNOSIS — R0609 Other forms of dyspnea: Secondary | ICD-10-CM | POA: Diagnosis not present

## 2017-09-26 DIAGNOSIS — R0602 Shortness of breath: Secondary | ICD-10-CM | POA: Diagnosis not present

## 2017-09-26 DIAGNOSIS — R601 Generalized edema: Secondary | ICD-10-CM | POA: Diagnosis not present

## 2017-09-26 DIAGNOSIS — I10 Essential (primary) hypertension: Secondary | ICD-10-CM

## 2017-09-26 DIAGNOSIS — D649 Anemia, unspecified: Secondary | ICD-10-CM

## 2017-09-26 LAB — POCT HEMOGLOBIN: Hemoglobin: 8.6 g/dL — AB (ref 12.2–16.2)

## 2017-09-26 MED ORDER — FUROSEMIDE 10 MG/ML IJ SOLN
40.0000 mg | Freq: Once | INTRAMUSCULAR | Status: AC
  Administered 2017-09-26: 40 mg via INTRAMUSCULAR

## 2017-09-26 MED ORDER — FUROSEMIDE 20 MG PO TABS: 20.0000 mg | ORAL_TABLET | Freq: Every day | ORAL | 0 refills | Status: DC | PRN
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
## 2017-09-26 NOTE — Patient Instructions (Signed)
Take lasix daily for the next 3 days. Check weight daily and check BMP on Friday.

## 2017-09-27 ENCOUNTER — Telehealth: Payer: Self-pay | Admitting: *Deleted

## 2017-09-27 DIAGNOSIS — E878 Other disorders of electrolyte and fluid balance, not elsewhere classified: Secondary | ICD-10-CM

## 2017-09-27 LAB — CBC
HCT: 27.7 % — ABNORMAL LOW (ref 35.0–45.0)
Hemoglobin: 8.4 g/dL — ABNORMAL LOW (ref 11.7–15.5)
MCH: 22.3 pg — ABNORMAL LOW (ref 27.0–33.0)
MCHC: 30.3 g/dL — ABNORMAL LOW (ref 32.0–36.0)
MCV: 73.5 fL — ABNORMAL LOW (ref 80.0–100.0)
MPV: 11.7 fL (ref 7.5–12.5)
Platelets: 252 10*3/uL (ref 140–400)
RBC: 3.77 10*6/uL — ABNORMAL LOW (ref 3.80–5.10)
RDW: 16.4 % — ABNORMAL HIGH (ref 11.0–15.0)
WBC: 9 10*3/uL (ref 3.8–10.8)

## 2017-09-27 LAB — BASIC METABOLIC PANEL WITH GFR
BUN/Creatinine Ratio: 27 (calc) — ABNORMAL HIGH (ref 6–22)
BUN: 27 mg/dL — ABNORMAL HIGH (ref 7–25)
CO2: 28 mmol/L (ref 20–32)
Calcium: 9.6 mg/dL (ref 8.6–10.4)
Chloride: 111 mmol/L — ABNORMAL HIGH (ref 98–110)
Creat: 0.99 mg/dL — ABNORMAL HIGH (ref 0.60–0.93)
GFR, Est African American: 66 mL/min/{1.73_m2} (ref >=60)
GFR, Est Non African American: 57 mL/min/{1.73_m2} — ABNORMAL LOW (ref >=60)
Glucose, Bld: 102 mg/dL — ABNORMAL HIGH (ref 65–99)
Potassium: 4.8 mmol/L (ref 3.5–5.3)
Sodium: 144 mmol/L (ref 135–146)

## 2017-09-27 LAB — IRON,TIBC AND FERRITIN PANEL
%SAT: 5 % (calc) — ABNORMAL LOW (ref 11–50)
FERRITIN: 8 ng/mL — AB (ref 20–288)
Iron: 26 ug/dL — ABNORMAL LOW (ref 45–160)
TIBC: 529 mcg/dL (calc) — ABNORMAL HIGH (ref 250–450)

## 2017-09-27 LAB — RETICULOCYTES
ABS Retic: 73340 cells/uL (ref 20000–8000)
RETIC CT PCT: 1.9 %

## 2017-09-27 LAB — BRAIN NATRIURETIC PEPTIDE: Brain Natriuretic Peptide: 498 pg/mL — ABNORMAL HIGH (ref <100)

## 2017-09-28 ENCOUNTER — Other Ambulatory Visit: Payer: Self-pay | Admitting: *Deleted

## 2017-09-28 DIAGNOSIS — E039 Hypothyroidism, unspecified: Secondary | ICD-10-CM

## 2017-09-28 DIAGNOSIS — M109 Gout, unspecified: Secondary | ICD-10-CM

## 2017-10-03 ENCOUNTER — Other Ambulatory Visit: Payer: Self-pay | Admitting: Family Medicine

## 2017-10-03 DIAGNOSIS — E878 Other disorders of electrolyte and fluid balance, not elsewhere classified: Secondary | ICD-10-CM | POA: Diagnosis not present

## 2017-10-03 LAB — BASIC METABOLIC PANEL WITH GFR
BUN / CREAT RATIO: 22 (calc) (ref 6–22)
BUN: 24 mg/dL (ref 7–25)
CHLORIDE: 109 mmol/L (ref 98–110)
CO2: 25 mmol/L (ref 20–32)
Calcium: 9.4 mg/dL (ref 8.6–10.4)
Creat: 1.09 mg/dL — ABNORMAL HIGH (ref 0.60–0.93)
GFR, Est African American: 58 mL/min/{1.73_m2} — ABNORMAL LOW (ref >=60)
GFR, Est Non African American: 50 mL/min/{1.73_m2} — ABNORMAL LOW (ref >=60)
GLUCOSE: 103 mg/dL — AB (ref 65–99)
POTASSIUM: 4.9 mmol/L (ref 3.5–5.3)
SODIUM: 143 mmol/L (ref 135–146)

## 2017-10-04 ENCOUNTER — Other Ambulatory Visit: Payer: Self-pay | Admitting: *Deleted

## 2017-10-04 DIAGNOSIS — Z1211 Encounter for screening for malignant neoplasm of colon: Secondary | ICD-10-CM

## 2017-10-04 LAB — POC HEMOCCULT BLD/STL (HOME/3-CARD/SCREEN)
CARD #3 DATE: 11152018
Card #2 Date: 11142018
Card #2 Fecal Occult Blod, POC: NEGATIVE
FECAL OCCULT BLD: NEGATIVE
Fecal Occult Blood, POC: NEGATIVE
OCCULT BLOOD DATE: 11132018

## 2017-10-05 DIAGNOSIS — R0600 Dyspnea, unspecified: Secondary | ICD-10-CM | POA: Diagnosis not present

## 2017-10-13 ENCOUNTER — Other Ambulatory Visit: Payer: Self-pay | Admitting: Family Medicine

## 2017-10-13 ENCOUNTER — Ambulatory Visit (INDEPENDENT_AMBULATORY_CARE_PROVIDER_SITE_OTHER): Payer: Medicare Other

## 2017-10-13 ENCOUNTER — Encounter: Payer: Self-pay | Admitting: Family Medicine

## 2017-10-13 ENCOUNTER — Ambulatory Visit (INDEPENDENT_AMBULATORY_CARE_PROVIDER_SITE_OTHER): Payer: Medicare Other | Admitting: Family Medicine

## 2017-10-13 VITALS — BP 120/62 | HR 59 | Temp 97.5°F | Ht 66.0 in | Wt 231.0 lb

## 2017-10-13 DIAGNOSIS — R509 Fever, unspecified: Secondary | ICD-10-CM

## 2017-10-13 DIAGNOSIS — R062 Wheezing: Secondary | ICD-10-CM

## 2017-10-13 DIAGNOSIS — R0602 Shortness of breath: Secondary | ICD-10-CM

## 2017-10-13 MED ORDER — AMBULATORY NON FORMULARY MEDICATION: 0 refills | Status: DC
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
## 2017-10-14 ENCOUNTER — Telehealth: Payer: Self-pay

## 2017-10-14 ENCOUNTER — Other Ambulatory Visit: Payer: Self-pay | Admitting: Cardiology

## 2017-10-14 ENCOUNTER — Other Ambulatory Visit: Payer: Self-pay

## 2017-10-14 MED ORDER — DOXYCYCLINE HYCLATE 100 MG PO TABS: 100.0000 mg | ORAL_TABLET | Freq: Two times a day (BID) | ORAL | 0 refills | Status: DC

## 2017-10-14 MED ORDER — AMBULATORY NON FORMULARY MEDICATION: 0 refills | Status: AC

## 2017-10-14 NOTE — Telephone Encounter (Signed)
Nicole Norton called and states she has had dry mouth for a couple of weeks. She asked what she can take for it and what may be the cause. Please advise.

## 2017-10-14 NOTE — Telephone Encounter (Signed)
lvm with recommendations. Asked that she call back to let us know if she is experiencing any pain w/swallowing. Nicole Norton, [name]

## 2017-10-14 NOTE — Telephone Encounter (Signed)
Call patient: Medical hence a different antibiotic called doxycycline.  She did not have any pneumonia on the chest x-ray but with a fever I do want to treat her for respiratory symptoms.  Please see if she is having any pain with swallowing.  She does use inhalers regularly she is at risk of getting thrush in the back of her mouth and this could be causing some of her symptoms.  There is also an over-the-counter product called Biotene which helps moisturize the mouth particularly when everything seems very dry.

## 2017-10-15 ENCOUNTER — Other Ambulatory Visit: Payer: Self-pay | Admitting: Family Medicine

## 2017-10-17 ENCOUNTER — Ambulatory Visit (INDEPENDENT_AMBULATORY_CARE_PROVIDER_SITE_OTHER): Payer: Medicare Other | Admitting: Physician Assistant

## 2017-10-17 VITALS — BP 122/82 | HR 67 | Ht 66.0 in | Wt 231.0 lb

## 2017-10-17 DIAGNOSIS — D649 Anemia, unspecified: Secondary | ICD-10-CM | POA: Diagnosis not present

## 2017-10-17 DIAGNOSIS — R0602 Shortness of breath: Secondary | ICD-10-CM | POA: Diagnosis not present

## 2017-10-17 DIAGNOSIS — J984 Other disorders of lung: Secondary | ICD-10-CM

## 2017-10-17 DIAGNOSIS — R059 Cough, unspecified: Secondary | ICD-10-CM

## 2017-10-17 DIAGNOSIS — R05 Cough: Secondary | ICD-10-CM | POA: Diagnosis not present

## 2017-10-17 LAB — CBC WITH DIFFERENTIAL/PLATELET
BASOS PCT: 0.7 %
Basophils Absolute: 75 cells/uL (ref 0–200)
EOS ABS: 396 {cells}/uL (ref 15–500)
Eosinophils Relative: 3.7 %
HEMATOCRIT: 32.2 % — AB (ref 35.0–45.0)
Hemoglobin: 10 g/dL — ABNORMAL LOW (ref 11.7–15.5)
Lymphs Abs: 1990 cells/uL (ref 850–3900)
MCH: 25.5 pg — ABNORMAL LOW (ref 27.0–33.0)
MCHC: 31.1 g/dL — ABNORMAL LOW (ref 32.0–36.0)
MCV: 82.1 fL (ref 80.0–100.0)
MPV: 11.2 fL (ref 7.5–12.5)
Monocytes Relative: 5.2 %
NEUTROS ABS: 7683 {cells}/uL (ref 1500–7800)
Neutrophils Relative %: 71.8 %
Platelets: 276 10*3/uL (ref 140–400)
RBC: 3.92 10*6/uL (ref 3.80–5.10)
RDW: 25.2 % — ABNORMAL HIGH (ref 11.0–15.0)
Total Lymphocyte: 18.6 %
WBC: 10.7 10*3/uL (ref 3.8–10.8)
WBCMIX: 556 {cells}/uL (ref 200–950)

## 2017-10-17 LAB — POCT INFLUENZA A/B
INFLUENZA A, POC: NEGATIVE
Influenza B, POC: NEGATIVE

## 2017-10-17 MED ORDER — PREDNISONE 20 MG PO TABS: ORAL_TABLET | ORAL | 0 refills | Status: DC

## 2017-10-17 NOTE — Progress Notes (Signed)
Subjective:    Patient ID: Nicole Norton, female    DOB: [birth year], 76 y.o.   MRN: [id number]  HPI  Pt is a 76 yo pleasant obese female with a.fib, restrictive lung disease who presents to the clinic c/o SOB, cough, intermittent fevers and chills for the last 3 weeks. Cough is not productive. Fever and chills only at night. She denies any sinus pressure, ear pain, ST.   Pt has been seeing Dr. Suzi Roots and she has been given a prednisone taper a few weeks ago which seemed to help the most and doxycycline that she started 3 days ago. CXR was negative for pneumonia. She was given lasix and diuresed some about 8lbs and she has kept it off. She is being seen by pulmonology and they are unclear what is causing her restrictive lung disease. Pt is on combivent and symbicort. She was recently given O2 and if she doesn't stay on it her baseline is 84 percent pulse ox. Her hgb was low at last cbc. On oral iron will recheck.   Pt has not had recent CT of lungs.   No current fever, chills. Without O2 and with ambulation pt struggles to breathe.   ... Active Ambulatory Problems    Diagnosis Date Noted  . Hypothyroidism 08/21/2009  . Hyperlipidemia LDL goal <160 03/18/2010  . OBESITY, UNSPECIFIED 08/21/2009  . Essential hypertension, benign 12/19/2008  . DEGENERATIVE JOINT DISEASE 06/24/2010  . SHOULDER PAIN, LEFT 12/19/2008  . BURSITIS, SHOULDER 12/31/2008  . Depressive disorder, not elsewhere classified 05/20/2011  . Family history of breast cancer 11/20/2012  . Obesity, Class II, BMI 35-39.9 12/15/2012  . Family history of diabetes mellitus 03/18/2014  . Urinary frequency 03/18/2014  . Depression with anxiety 03/18/2014  . Gastroesophageal reflux disease without esophagitis 02/11/2015  . Gout 09/16/2015  . Cardiac arrhythmia 01/02/2016  . Aortic atherosclerosis ([place]) 03/04/2017  . Restrictive lung disease 03/10/2017  . Nocturnal hypoxemia 05/05/2017  . Acute on chronic respiratory failure  with hypoxemia ([place]) 05/05/2017  . Pulmonary nodule 05/05/2017   Resolved Ambulatory Problems    Diagnosis Date Noted  . Adjustment disorder with anxiety 03/23/2010  . Sinusitis acute 05/20/2011   Past Medical History:  Diagnosis Date  . Allergy   . Anxiety   . Cardiac arrhythmia   . DDD (degenerative disc disease) 1991 and 2002  . Hyperlipidemia   . Hypertension   . Obesity   . Thyroid disease           Review of Systems  All other systems reviewed and are negative.      Objective:   Physical Exam  Constitutional: She is oriented to person, place, and time. She appears well-developed and well-nourished.  HENT:  Head: Normocephalic and atraumatic.  Eyes: Conjunctivae are normal. Right eye exhibits no discharge. Left eye exhibits no discharge.  Neck: Normal range of motion. Neck supple.  Cardiovascular: Normal rate, regular rhythm and normal heart sounds.  Pulmonary/Chest:  SOB in exam room. Pulse ox in 80's before placed on 2L of and went up to 95 percent.  Coarse breath sounds.  Crackles/expiratory wheezing heard over left lower lungs.   Musculoskeletal: She exhibits edema.  1+ bilateral pitting edema ankles.   Lymphadenopathy:    She has no cervical adenopathy.  Neurological: She is alert and oriented to person, place, and time.  Psychiatric: She has a normal mood and affect. Her behavior is normal.          Assessment & Plan:  Marland KitchenMarland Kitchen  Diagnoses and all orders for this visit:  SOB (shortness of breath) -     predniSONE (DELTASONE) 20 MG tablet; Take 3 tablets for 3 days, take 2 tablets for 3 days, take 1 tablet for 3 days, take 1/2 for 4 days. -     CBC with Differential/Platelet -     POCT Influenza A/B  Cough -     predniSONE (DELTASONE) 20 MG tablet; Take 3 tablets for 3 days, take 2 tablets for 3 days, take 1 tablet for 3 days, take 1/2 for 4 days. -     CBC with Differential/Platelet -     POCT Influenza A/B  Low hemoglobin -     CBC with  Differential/Platelet  Restrictive lung disease -     predniSONE (DELTASONE) 20 MG tablet; Take 3 tablets for 3 days, take 2 tablets for 3 days, take 1 tablet for 3 days, take 1/2 for 4 days.  Influenza is negative.   Discussed plan with PCP, Dr. [name].  Weight is stable use lasix with greater than 2lbs of weight gain.  Continue on doxycycline. Start prednisone taper since that is when she gets the best relief. Stay on O2 to keep pulse ox above 90 percent.  Will order CT of chest.  Follow up with PCP or pulmonologist in 1 week.  Continue all other preventative medications.   Marland KitchenSpent 30 minutes with patient and greater than 50 percent of visit spent counseling patient regarding treatment plan.

## 2017-10-19 ENCOUNTER — Ambulatory Visit (INDEPENDENT_AMBULATORY_CARE_PROVIDER_SITE_OTHER): Payer: Medicare Other

## 2017-10-19 DIAGNOSIS — R911 Solitary pulmonary nodule: Secondary | ICD-10-CM | POA: Diagnosis not present

## 2017-10-19 DIAGNOSIS — R918 Other nonspecific abnormal finding of lung field: Secondary | ICD-10-CM | POA: Diagnosis not present

## 2017-10-20 DIAGNOSIS — R0602 Shortness of breath: Secondary | ICD-10-CM | POA: Diagnosis not present

## 2017-10-20 DIAGNOSIS — R062 Wheezing: Secondary | ICD-10-CM | POA: Diagnosis not present

## 2017-10-20 DIAGNOSIS — R918 Other nonspecific abnormal finding of lung field: Secondary | ICD-10-CM | POA: Diagnosis not present

## 2017-10-20 DIAGNOSIS — Z9981 Dependence on supplemental oxygen: Secondary | ICD-10-CM | POA: Diagnosis not present

## 2017-10-20 DIAGNOSIS — R0609 Other forms of dyspnea: Secondary | ICD-10-CM | POA: Diagnosis not present

## 2017-10-20 DIAGNOSIS — I10 Essential (primary) hypertension: Secondary | ICD-10-CM | POA: Diagnosis not present

## 2017-10-20 DIAGNOSIS — Z7901 Long term (current) use of anticoagulants: Secondary | ICD-10-CM | POA: Diagnosis not present

## 2017-10-20 DIAGNOSIS — E669 Obesity, unspecified: Secondary | ICD-10-CM | POA: Diagnosis not present

## 2017-10-20 DIAGNOSIS — D509 Iron deficiency anemia, unspecified: Secondary | ICD-10-CM | POA: Diagnosis not present

## 2017-10-20 DIAGNOSIS — I4891 Unspecified atrial fibrillation: Secondary | ICD-10-CM | POA: Diagnosis not present

## 2017-10-20 NOTE — Telephone Encounter (Signed)
Closed

## 2017-10-21 ENCOUNTER — Encounter: Payer: Self-pay | Admitting: Family Medicine

## 2017-10-21 DIAGNOSIS — J82 Pulmonary eosinophilia, not elsewhere classified: Secondary | ICD-10-CM

## 2017-10-21 DIAGNOSIS — J8281 Chronic eosinophilic pneumonia: Secondary | ICD-10-CM | POA: Insufficient documentation

## 2017-11-09 DIAGNOSIS — E7800 Pure hypercholesterolemia, unspecified: Secondary | ICD-10-CM | POA: Insufficient documentation

## 2017-11-09 DIAGNOSIS — E079 Disorder of thyroid, unspecified: Secondary | ICD-10-CM | POA: Insufficient documentation

## 2017-11-09 DIAGNOSIS — I10 Essential (primary) hypertension: Secondary | ICD-10-CM | POA: Insufficient documentation

## 2017-11-09 DIAGNOSIS — F419 Anxiety disorder, unspecified: Secondary | ICD-10-CM | POA: Insufficient documentation

## 2017-11-09 DIAGNOSIS — E785 Hyperlipidemia, unspecified: Secondary | ICD-10-CM | POA: Insufficient documentation

## 2017-11-09 DIAGNOSIS — E669 Obesity, unspecified: Secondary | ICD-10-CM | POA: Insufficient documentation

## 2017-11-09 DIAGNOSIS — T7840XA Allergy, unspecified, initial encounter: Secondary | ICD-10-CM | POA: Insufficient documentation

## 2017-11-18 DIAGNOSIS — D509 Iron deficiency anemia, unspecified: Secondary | ICD-10-CM | POA: Insufficient documentation

## 2017-11-18 DIAGNOSIS — Z7901 Long term (current) use of anticoagulants: Secondary | ICD-10-CM | POA: Insufficient documentation

## 2017-11-18 DIAGNOSIS — J82 Pulmonary eosinophilia, not elsewhere classified: Secondary | ICD-10-CM | POA: Diagnosis not present

## 2017-11-18 DIAGNOSIS — I482 Chronic atrial fibrillation, unspecified: Secondary | ICD-10-CM | POA: Insufficient documentation

## 2017-11-18 DIAGNOSIS — R601 Generalized edema: Secondary | ICD-10-CM | POA: Diagnosis not present

## 2017-11-18 DIAGNOSIS — G4734 Idiopathic sleep related nonobstructive alveolar hypoventilation: Secondary | ICD-10-CM | POA: Diagnosis not present

## 2017-11-23 ENCOUNTER — Other Ambulatory Visit: Payer: Self-pay | Admitting: Physical Medicine and Rehabilitation

## 2017-11-23 ENCOUNTER — Ambulatory Visit (INDEPENDENT_AMBULATORY_CARE_PROVIDER_SITE_OTHER): Payer: Medicare Other

## 2017-11-23 DIAGNOSIS — S9032XA Contusion of left foot, initial encounter: Secondary | ICD-10-CM

## 2017-11-23 DIAGNOSIS — S9002XA Contusion of left ankle, initial encounter: Secondary | ICD-10-CM

## 2017-11-23 DIAGNOSIS — M47817 Spondylosis without myelopathy or radiculopathy, lumbosacral region: Secondary | ICD-10-CM | POA: Diagnosis not present

## 2017-11-23 DIAGNOSIS — T1490XA Injury, unspecified, initial encounter: Secondary | ICD-10-CM

## 2017-11-23 DIAGNOSIS — X58XXXA Exposure to other specified factors, initial encounter: Secondary | ICD-10-CM

## 2017-11-23 DIAGNOSIS — M170 Bilateral primary osteoarthritis of knee: Secondary | ICD-10-CM | POA: Diagnosis not present

## 2017-11-23 DIAGNOSIS — G894 Chronic pain syndrome: Secondary | ICD-10-CM | POA: Diagnosis not present

## 2017-11-23 DIAGNOSIS — M47812 Spondylosis without myelopathy or radiculopathy, cervical region: Secondary | ICD-10-CM | POA: Diagnosis not present

## 2017-11-24 ENCOUNTER — Ambulatory Visit (INDEPENDENT_AMBULATORY_CARE_PROVIDER_SITE_OTHER): Payer: Medicare Other | Admitting: Cardiology

## 2017-11-24 ENCOUNTER — Encounter: Payer: Self-pay | Admitting: Cardiology

## 2017-11-24 DIAGNOSIS — I480 Paroxysmal atrial fibrillation: Secondary | ICD-10-CM

## 2017-11-24 DIAGNOSIS — I10 Essential (primary) hypertension: Secondary | ICD-10-CM | POA: Diagnosis not present

## 2017-11-24 NOTE — Patient Instructions (Signed)
Medication Instructions:  Your physician recommends that you continue on your current medications as directed. Please refer to the Current Medication list given to you today.  If you need a refill on your cardiac medications before your next appointment, please call your pharmacy.   Labwork: None ordered  Testing/Procedures: None ordered  Follow-Up: Your physician wants you to follow-up in: 6 months with Dr. Camnitz.  You will receive a reminder letter in the mail two months in advance. If you don't receive a letter, please call our office to schedule the follow-up appointment.  Thank you for choosing [facility]!!   Binyomin Brann, RN [phone]         

## 2017-11-24 NOTE — Addendum Note (Signed)
Addended by: Stanton Kidney on: 11/24/2017 12:01 PM   Modules accepted: Orders
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
## 2017-12-02 DIAGNOSIS — J82 Pulmonary eosinophilia, not elsewhere classified: Secondary | ICD-10-CM | POA: Diagnosis not present

## 2017-12-04 ENCOUNTER — Other Ambulatory Visit: Payer: Self-pay | Admitting: Family Medicine

## 2017-12-06 DIAGNOSIS — J82 Pulmonary eosinophilia, not elsewhere classified: Secondary | ICD-10-CM | POA: Diagnosis not present

## 2017-12-14 DIAGNOSIS — J82 Pulmonary eosinophilia, not elsewhere classified: Secondary | ICD-10-CM | POA: Diagnosis not present

## 2017-12-20 DIAGNOSIS — J82 Pulmonary eosinophilia, not elsewhere classified: Secondary | ICD-10-CM | POA: Diagnosis not present

## 2017-12-22 DIAGNOSIS — M47812 Spondylosis without myelopathy or radiculopathy, cervical region: Secondary | ICD-10-CM | POA: Diagnosis not present

## 2017-12-22 DIAGNOSIS — M47817 Spondylosis without myelopathy or radiculopathy, lumbosacral region: Secondary | ICD-10-CM | POA: Diagnosis not present

## 2017-12-22 DIAGNOSIS — M170 Bilateral primary osteoarthritis of knee: Secondary | ICD-10-CM | POA: Diagnosis not present

## 2017-12-22 DIAGNOSIS — G894 Chronic pain syndrome: Secondary | ICD-10-CM | POA: Diagnosis not present

## 2018-01-17 ENCOUNTER — Ambulatory Visit (INDEPENDENT_AMBULATORY_CARE_PROVIDER_SITE_OTHER): Payer: Medicare Other | Admitting: Cardiovascular Disease

## 2018-01-17 ENCOUNTER — Encounter: Payer: Self-pay | Admitting: Cardiovascular Disease

## 2018-01-17 DIAGNOSIS — E785 Hyperlipidemia, unspecified: Secondary | ICD-10-CM

## 2018-01-17 DIAGNOSIS — I10 Essential (primary) hypertension: Secondary | ICD-10-CM

## 2018-01-17 DIAGNOSIS — I480 Paroxysmal atrial fibrillation: Secondary | ICD-10-CM | POA: Diagnosis not present

## 2018-01-17 NOTE — Assessment & Plan Note (Addendum)
History of essential hypertension blood pressure measured at 150/92. She is on amlodipine and carvedilol. I believe her pressure is elevated because of being on prednisone.

## 2018-01-17 NOTE — Progress Notes (Signed)
01/17/2018 Nicole Norton   1941-06-21  [id number]  Primary Physician Hali Marry, MD Primary Cardiologist: Lorretta Harp MD FACP, [place], [place], [place]  HPI:  Nicole Norton is a 77 y.o.  mildly overweight married Caucasian female with no children who I have taken care of several years ago and have not seen her back since. Her husband [name] was also a patient of mine. I last saw her in the office  01/19/17  .She has a history of hypertension, hyperlipidemia and an arrhythmia which I diagnosed 10-15 years ago. She has never had a heart attack or stroke. She's had multiple stress test off which have been normal as well as 2-D echocardiograms and event monitors. She is statin intolerant. She denies chest pain or shortness of breath. She recently was diagnosed with gout as well as osteoarthritis arthritis. [name] She was placed on gout medicines with resulted in exacerbation of her arrhythmia. Once thesemedicines were discontinued her arrhythmia returned to baseline. An event monitor showed episodes of paroxysmal atrial fibrillation. A routine GXT was normal. I referred her to Dr. Curt Bears , electrophysiology, who placed her on flecainide and Xarelto. Her A. fib has markedly improved as has her blood pressure control. Since I saw her in the office a year ago she has developed eosinophilic pneumonia is being treated by pulmonologist at [facility] with steroids. Higher doses have exacerbated her atrial fibrillation.     Current Meds  Medication Sig  . AMBULATORY NON FORMULARY MEDICATION On continuous oxygen 2 L.  Stationary pulse ox at 88%.  Drops with activity.  Diagnosis restrictive lung disease with hypoxemia. Portable gas via nasal cannula. (Patient taking differently: On continuous oxygen 2 L.  Stationary pulse ox at 88%.  Drops with activity.  Diagnosis eosiniphilic pneumonia . Portable gas via nasal cannula.)  . AMITIZA 24 MCG capsule 2 (two) times daily with a  meal.   . amLODipine (NORVASC) 10 MG tablet Take 1 tablet (10 mg total) by mouth daily.  [name] BENICAR 40 MG tablet TAKE 1 TABLET DAILY  . cetirizine (ZYRTEC) 10 MG tablet Take 10 mg by mouth as needed.    . clobetasol cream (TEMOVATE) 0.05 % Apply topically daily as needed.  [name] estrogens, conjugated, (PREMARIN) 0.625 MG tablet Take 0.625 mg by mouth 2 (two) times a week. Take daily for 21 days then do not take for 7 days.  . ferrous sulfate 325 (65 FE) MG tablet Take 650 mg by mouth daily with breakfast. Take 2 tablets by mouth daily with breakfast   . flecainide (TAMBOCOR) 100 MG tablet Take 1 tablet (100 mg total) by mouth 2 (two) times daily.  . furosemide (LASIX) 20 MG tablet Take 1 tablet (20 mg total) daily as needed by mouth.  . lansoprazole (PREVACID) 30 MG capsule TAKE 1 CAPSULE DAILY  . levothyroxine (SYNTHROID, LEVOTHROID) 200 MCG tablet TAKE ONE TABLET BY MOUTH EVERY DAY BEFORE BREAKFAST  . levothyroxine (SYNTHROID, LEVOTHROID) 25 MCG tablet TAKE ONE TABLET EVERY DAY BEFORE BREAKFAST  . Misc Natural Products (GLUCOSAMINE CHONDROITIN ADV PO) Take 1 tablet by mouth daily.  . Multiple Vitamins-Minerals (CENTRUM SILVER PO) Take by mouth daily.    . Omega-3 Fatty Acids (FISH OIL) 1200 MG CAPS Take by mouth daily.    . Oxycodone HCl 10 MG TABS Take 10 mg by mouth 4 (four) times daily.  . predniSONE (DELTASONE) 20 MG tablet Take 30 mg by mouth daily with breakfast.  .  pregabalin (LYRICA) 75 MG capsule Take 75 mg by mouth 3 (three) times daily.  . rivaroxaban (XARELTO) 20 MG TABS tablet TAKE 1 TABLET DAILY WITH SUPPER  . vitamin B-12 (CYANOCOBALAMIN) 500 MCG tablet Take 500 mcg by mouth. 3 WEEKLY  . [DISCONTINUED] flecainide (TAMBOCOR) 100 MG tablet TAKE 1 TABLET TWICE A DAY (INCREASED DOSAGE)     Allergies  Allergen Reactions  . Allopurinol Palpitations  . Uloric [Febuxostat] Other (See Comments)    Episodes of AFIB  . Colchicine Other (See Comments)    palpitations  . Cymbalta  [Duloxetine Hcl] Other (See Comments)    palpitations  . Etodolac Other (See Comments)    heart flutters and headaches   . Penicillins   . Solu-Medrol [Methylprednisolone Acetate]     Heart palpitations/bad headache  . Statins Tinitus    REACTION: myalgias REACTION: myalgias    Social History   Socioeconomic History  . Marital status: Married    Spouse name: Not on file  . Number of children: Not on file  . Years of education: Not on file  . Highest education level: Not on file  Social Needs  . Financial resource strain: Not on file  . Food insecurity - worry: Not on file  . Food insecurity - inability: Not on file  . Transportation needs - medical: Not on file  . Transportation needs - non-medical: Not on file  Occupational History  . Not on file  Tobacco Use  . Smoking status: Former Smoker    Packs/day: 1.00    Years: 20.00    Pack years: 20.00    Types: Cigarettes    Last attempt to quit: 11/16/1999    Years since quitting: 18.1  . Smokeless tobacco: Never Used  Substance and Sexual Activity  . Alcohol use: Yes    Alcohol/week: 3.6 oz    Types: 6 Glasses of wine per week  . Drug use: No  . Sexual activity: Not on file  Other Topics Concern  . Not on file  Social History Narrative  . Not on file     Review of Systems: General: negative for chills, fever, night sweats or weight changes.  Cardiovascular: negative for chest pain, dyspnea on exertion, edema, orthopnea, palpitations, paroxysmal nocturnal dyspnea or shortness of breath Dermatological: negative for rash Respiratory: negative for cough or wheezing Urologic: negative for hematuria Abdominal: negative for nausea, vomiting, diarrhea, bright red blood per rectum, melena, or hematemesis Neurologic: negative for visual changes, syncope, or dizziness All other systems reviewed and are otherwise negative except as noted above.    Blood pressure (!) 150/92, pulse 74, height 5\' 6"  (1.676 m), weight 237 lb  6.4 oz (107.7 kg), SpO2 93 %.  General appearance: alert and no distress Neck: no adenopathy, no carotid bruit, no JVD, supple, symmetrical, trachea midline and thyroid not enlarged, symmetric, no tenderness/mass/nodules Lungs: clear to auscultation bilaterally Heart: regular rate and rhythm, S1, S2 normal, no murmur, click, rub or gallop Extremities: extremities normal, atraumatic, no cyanosis or edema Pulses: 2+ and symmetric Skin: Skin color, texture, turgor normal. No rashes or lesions Neurologic: Alert and oriented X 3, normal strength and tone. Normal symmetric reflexes. Normal coordination and gait  EKG not performed today  ASSESSMENT AND PLAN:   Hyperlipidemia LDL goal <160 History of hyperlipidemia not on statin therapy with lipid profile performed 01/07/17 revealing a total cholesterol 247,  HDL 75 followed by her PCP  Essential hypertension, benign History of essential hypertension blood pressure measured at  150/92. She is on amlodipine and carvedilol. I believe her pressure is elevated because of being on prednisone.  Cardiac arrhythmia History of PAF maintaining sinus rhythm on flecainide and Xarelto .      Lorretta Harp MD FACP,FACC,FAHA, [facility] 01/17/2018 11:15 AM

## 2018-01-17 NOTE — Assessment & Plan Note (Signed)
History of hyperlipidemia not on statin therapy with lipid profile performed 01/07/17 revealing a total cholesterol 247,  HDL 75 followed by her PCP

## 2018-01-17 NOTE — Assessment & Plan Note (Signed)
History of PAF maintaining sinus rhythm on flecainide and Xarelto .

## 2018-01-17 NOTE — Patient Instructions (Signed)

## 2018-01-19 DIAGNOSIS — G894 Chronic pain syndrome: Secondary | ICD-10-CM | POA: Diagnosis not present

## 2018-01-19 DIAGNOSIS — M170 Bilateral primary osteoarthritis of knee: Secondary | ICD-10-CM | POA: Diagnosis not present

## 2018-01-19 DIAGNOSIS — M47812 Spondylosis without myelopathy or radiculopathy, cervical region: Secondary | ICD-10-CM | POA: Diagnosis not present

## 2018-01-19 DIAGNOSIS — M47817 Spondylosis without myelopathy or radiculopathy, lumbosacral region: Secondary | ICD-10-CM | POA: Diagnosis not present

## 2018-01-28 ENCOUNTER — Other Ambulatory Visit: Payer: Self-pay | Admitting: Cardiology

## 2018-02-03 DIAGNOSIS — Z79899 Other long term (current) drug therapy: Secondary | ICD-10-CM | POA: Diagnosis not present

## 2018-02-03 DIAGNOSIS — J984 Other disorders of lung: Secondary | ICD-10-CM | POA: Diagnosis not present

## 2018-02-03 DIAGNOSIS — I471 Supraventricular tachycardia: Secondary | ICD-10-CM | POA: Diagnosis not present

## 2018-02-03 DIAGNOSIS — Z7952 Long term (current) use of systemic steroids: Secondary | ICD-10-CM | POA: Diagnosis not present

## 2018-02-03 DIAGNOSIS — J82 Pulmonary eosinophilia, not elsewhere classified: Secondary | ICD-10-CM | POA: Diagnosis not present

## 2018-02-03 DIAGNOSIS — Z87891 Personal history of nicotine dependence: Secondary | ICD-10-CM | POA: Diagnosis not present

## 2018-02-03 DIAGNOSIS — R0902 Hypoxemia: Secondary | ICD-10-CM | POA: Diagnosis not present

## 2018-02-03 DIAGNOSIS — I272 Pulmonary hypertension, unspecified: Secondary | ICD-10-CM | POA: Diagnosis not present

## 2018-02-03 DIAGNOSIS — R601 Generalized edema: Secondary | ICD-10-CM | POA: Diagnosis not present

## 2018-02-03 DIAGNOSIS — Z7901 Long term (current) use of anticoagulants: Secondary | ICD-10-CM | POA: Diagnosis not present

## 2018-02-03 DIAGNOSIS — K449 Diaphragmatic hernia without obstruction or gangrene: Secondary | ICD-10-CM | POA: Diagnosis not present

## 2018-02-03 DIAGNOSIS — G4734 Idiopathic sleep related nonobstructive alveolar hypoventilation: Secondary | ICD-10-CM | POA: Diagnosis not present

## 2018-02-06 ENCOUNTER — Other Ambulatory Visit: Payer: Self-pay | Admitting: Family Medicine

## 2018-02-07 NOTE — Telephone Encounter (Signed)
Pt reports that she has been taking this medication for her Hot flashes. She has gotten down to only taking 2 tablets per week and she had an abundance of the medication that she did not need to call in to have this refilled until now. She reports that she has been taking the medication the whole time and could not call her mail order due to it not being on file. Will route to pcp for review and signature.Nicole Norton, [place]

## 2018-02-13 ENCOUNTER — Other Ambulatory Visit: Payer: Self-pay | Admitting: Cardiology

## 2018-02-16 ENCOUNTER — Other Ambulatory Visit: Payer: Self-pay

## 2018-02-20 DIAGNOSIS — G894 Chronic pain syndrome: Secondary | ICD-10-CM | POA: Diagnosis not present

## 2018-02-20 DIAGNOSIS — M47812 Spondylosis without myelopathy or radiculopathy, cervical region: Secondary | ICD-10-CM | POA: Diagnosis not present

## 2018-02-20 DIAGNOSIS — M170 Bilateral primary osteoarthritis of knee: Secondary | ICD-10-CM | POA: Diagnosis not present

## 2018-02-20 DIAGNOSIS — M47817 Spondylosis without myelopathy or radiculopathy, lumbosacral region: Secondary | ICD-10-CM | POA: Diagnosis not present

## 2018-03-06 ENCOUNTER — Ambulatory Visit (INDEPENDENT_AMBULATORY_CARE_PROVIDER_SITE_OTHER): Payer: Medicare Other | Admitting: Family Medicine

## 2018-03-06 ENCOUNTER — Encounter: Payer: Self-pay | Admitting: Family Medicine

## 2018-03-06 VITALS — BP 136/84 | HR 57 | Ht 66.0 in | Wt 239.0 lb

## 2018-03-06 DIAGNOSIS — E559 Vitamin D deficiency, unspecified: Secondary | ICD-10-CM

## 2018-03-06 DIAGNOSIS — I10 Essential (primary) hypertension: Secondary | ICD-10-CM

## 2018-03-06 DIAGNOSIS — R7309 Other abnormal glucose: Secondary | ICD-10-CM | POA: Diagnosis not present

## 2018-03-06 DIAGNOSIS — R5383 Other fatigue: Secondary | ICD-10-CM | POA: Diagnosis not present

## 2018-03-06 DIAGNOSIS — R635 Abnormal weight gain: Secondary | ICD-10-CM | POA: Diagnosis not present

## 2018-03-06 DIAGNOSIS — J8281 Chronic eosinophilic pneumonia: Secondary | ICD-10-CM

## 2018-03-06 DIAGNOSIS — E039 Hypothyroidism, unspecified: Secondary | ICD-10-CM | POA: Diagnosis not present

## 2018-03-06 DIAGNOSIS — M109 Gout, unspecified: Secondary | ICD-10-CM

## 2018-03-06 DIAGNOSIS — Z13220 Encounter for screening for lipoid disorders: Secondary | ICD-10-CM | POA: Diagnosis not present

## 2018-03-06 DIAGNOSIS — D508 Other iron deficiency anemias: Secondary | ICD-10-CM

## 2018-03-06 DIAGNOSIS — J82 Pulmonary eosinophilia, not elsewhere classified: Secondary | ICD-10-CM | POA: Diagnosis not present

## 2018-03-06 MED ORDER — FUROSEMIDE 10 MG/ML IJ SOLN
20.0000 mg | Freq: Once | INTRAMUSCULAR | Status: AC
  Administered 2018-03-06: 20 mg via INTRAMUSCULAR
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
## 2018-03-07 LAB — LIPID PANEL W/REFLEX DIRECT LDL
Cholesterol: 305 mg/dL — ABNORMAL HIGH (ref <200)
HDL: 85 mg/dL (ref >50)
LDL CHOLESTEROL (CALC): 185 mg/dL — AB
Non-HDL Cholesterol (Calc): 220 mg/dL (calc) — ABNORMAL HIGH (ref <130)
TRIGLYCERIDES: 181 mg/dL — AB (ref <150)
Total CHOL/HDL Ratio: 3.6 (calc) (ref <5.0)

## 2018-03-07 LAB — COMPLETE METABOLIC PANEL WITH GFR
AG Ratio: 1.8 (calc) (ref 1.0–2.5)
ALBUMIN MSPROF: 4.2 g/dL (ref 3.6–5.1)
ALT: 14 U/L (ref 6–29)
AST: 12 U/L (ref 10–35)
Alkaline phosphatase (APISO): 50 U/L (ref 33–130)
BUN/Creatinine Ratio: 30 (calc) — ABNORMAL HIGH (ref 6–22)
BUN: 32 mg/dL — AB (ref 7–25)
CO2: 31 mmol/L (ref 20–32)
Calcium: 9.1 mg/dL (ref 8.6–10.4)
Chloride: 104 mmol/L (ref 98–110)
Creat: 1.07 mg/dL — ABNORMAL HIGH (ref 0.60–0.93)
GFR, EST NON AFRICAN AMERICAN: 51 mL/min/{1.73_m2} — AB (ref >=60)
GFR, Est African American: 59 mL/min/{1.73_m2} — ABNORMAL LOW (ref >=60)
GLOBULIN: 2.3 g/dL (ref 1.9–3.7)
Glucose, Bld: 86 mg/dL (ref 65–99)
Potassium: 3.8 mmol/L (ref 3.5–5.3)
SODIUM: 144 mmol/L (ref 135–146)
Total Bilirubin: 0.5 mg/dL (ref 0.2–1.2)
Total Protein: 6.5 g/dL (ref 6.1–8.1)

## 2018-03-07 LAB — CBC WITH DIFFERENTIAL/PLATELET
BASOS ABS: 48 {cells}/uL (ref 0–200)
Basophils Relative: 0.6 %
EOS ABS: 104 {cells}/uL (ref 15–500)
Eosinophils Relative: 1.3 %
HCT: 38.7 % (ref 35.0–45.0)
HEMOGLOBIN: 13.3 g/dL (ref 11.7–15.5)
LYMPHS ABS: 1992 {cells}/uL (ref 850–3900)
MCH: 34.7 pg — AB (ref 27.0–33.0)
MCHC: 34.4 g/dL (ref 32.0–36.0)
MCV: 101 fL — ABNORMAL HIGH (ref 80.0–100.0)
MPV: 10.5 fL (ref 7.5–12.5)
Monocytes Relative: 7 %
NEUTROS ABS: 5296 {cells}/uL (ref 1500–7800)
Neutrophils Relative %: 66.2 %
Platelets: 224 10*3/uL (ref 140–400)
RBC: 3.83 10*6/uL (ref 3.80–5.10)
RDW: 13.4 % (ref 11.0–15.0)
Total Lymphocyte: 24.9 %
WBC mixed population: 560 cells/uL (ref 200–950)
WBC: 8 10*3/uL (ref 3.8–10.8)

## 2018-03-07 LAB — IRON,TIBC AND FERRITIN PANEL
%SAT: 24 % (calc) (ref 11–50)
Ferritin: 47 ng/mL (ref 20–288)
Iron: 87 ug/dL (ref 45–160)
TIBC: 356 ug/dL (ref 250–450)

## 2018-03-07 LAB — HEMOGLOBIN A1C
HEMOGLOBIN A1C: 5.2 %{Hb} (ref <5.7)
MEAN PLASMA GLUCOSE: 103 (calc)
eAG (mmol/L): 5.7 (calc)

## 2018-03-07 LAB — URIC ACID: URIC ACID, SERUM: 8.3 mg/dL — AB (ref 2.5–7.0)

## 2018-03-07 LAB — VITAMIN D 25 HYDROXY (VIT D DEFICIENCY, FRACTURES): VIT D 25 HYDROXY: 18 ng/mL — AB (ref 30–100)

## 2018-03-07 LAB — TSH: TSH: 0.09 mIU/L — ABNORMAL LOW (ref 0.40–4.50)

## 2018-03-20 DIAGNOSIS — G894 Chronic pain syndrome: Secondary | ICD-10-CM | POA: Diagnosis not present

## 2018-03-20 DIAGNOSIS — M47817 Spondylosis without myelopathy or radiculopathy, lumbosacral region: Secondary | ICD-10-CM | POA: Diagnosis not present

## 2018-03-20 DIAGNOSIS — M47812 Spondylosis without myelopathy or radiculopathy, cervical region: Secondary | ICD-10-CM | POA: Diagnosis not present

## 2018-03-20 DIAGNOSIS — M170 Bilateral primary osteoarthritis of knee: Secondary | ICD-10-CM | POA: Diagnosis not present

## 2018-03-22 ENCOUNTER — Other Ambulatory Visit: Payer: Self-pay | Admitting: Cardiology

## 2018-03-22 ENCOUNTER — Other Ambulatory Visit: Payer: Self-pay | Admitting: Family Medicine

## 2018-03-23 ENCOUNTER — Other Ambulatory Visit: Payer: Self-pay | Admitting: Family Medicine

## 2018-04-04 ENCOUNTER — Other Ambulatory Visit: Payer: Self-pay | Admitting: Family Medicine

## 2018-04-04 MED ORDER — FUROSEMIDE 40 MG PO TABS: 60.0000 mg | ORAL_TABLET | Freq: Every day | ORAL | 2 refills | Status: DC | PRN

## 2018-04-06 ENCOUNTER — Other Ambulatory Visit: Payer: Self-pay | Admitting: Family Medicine

## 2018-04-07 ENCOUNTER — Other Ambulatory Visit: Payer: Self-pay

## 2018-04-07 MED ORDER — SUCRALFATE 1 GM/10ML PO SUSP: ORAL | 2 refills | Status: DC

## 2018-04-07 NOTE — Telephone Encounter (Signed)
Pt advised Rx sent.

## 2018-04-07 NOTE — Telephone Encounter (Signed)
OK to RF

## 2018-04-07 NOTE — Telephone Encounter (Signed)
Pt called requesting a 90 day supply of Carafate be called in to [facility] mail order.  This was removed from her med list but patient states it should not have been, she takes this PRN.  Please review and advise if ok to RF.  Thanks!

## 2018-04-13 ENCOUNTER — Other Ambulatory Visit: Payer: Self-pay | Admitting: Family Medicine

## 2018-04-20 ENCOUNTER — Ambulatory Visit (INDEPENDENT_AMBULATORY_CARE_PROVIDER_SITE_OTHER): Payer: Medicare Other | Admitting: Family Medicine

## 2018-04-20 ENCOUNTER — Encounter: Payer: Self-pay | Admitting: Family Medicine

## 2018-04-20 VITALS — BP 132/76 | HR 54 | Ht 66.0 in | Wt 244.0 lb

## 2018-04-20 DIAGNOSIS — G894 Chronic pain syndrome: Secondary | ICD-10-CM | POA: Diagnosis not present

## 2018-04-20 DIAGNOSIS — L03116 Cellulitis of left lower limb: Secondary | ICD-10-CM

## 2018-04-20 DIAGNOSIS — R600 Localized edema: Secondary | ICD-10-CM

## 2018-04-20 DIAGNOSIS — L0100 Impetigo, unspecified: Secondary | ICD-10-CM

## 2018-04-20 DIAGNOSIS — M47812 Spondylosis without myelopathy or radiculopathy, cervical region: Secondary | ICD-10-CM | POA: Diagnosis not present

## 2018-04-20 DIAGNOSIS — M170 Bilateral primary osteoarthritis of knee: Secondary | ICD-10-CM | POA: Diagnosis not present

## 2018-04-20 DIAGNOSIS — M47817 Spondylosis without myelopathy or radiculopathy, lumbosacral region: Secondary | ICD-10-CM | POA: Diagnosis not present

## 2018-04-20 MED ORDER — CLOBETASOL PROPIONATE 0.05 % EX CREA: TOPICAL_CREAM | Freq: Every day | CUTANEOUS | 0 refills | Status: DC | PRN

## 2018-04-20 MED ORDER — BUMETANIDE 1 MG PO TABS: 1.0000 mg | ORAL_TABLET | Freq: Every day | ORAL | 1 refills | Status: DC

## 2018-04-20 MED ORDER — CEPHALEXIN 500 MG PO CAPS
500.0000 mg | ORAL_CAPSULE | Freq: Three times a day (TID) | ORAL | 0 refills | Status: DC
Start: 2018-04-20 — End: 2018-04-24
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
## 2018-04-24 ENCOUNTER — Telehealth: Payer: Self-pay

## 2018-04-24 MED ORDER — MUPIROCIN 2 % EX OINT: TOPICAL_OINTMENT | Freq: Two times a day (BID) | CUTANEOUS | 1 refills | Status: DC

## 2018-04-24 MED ORDER — DOXYCYCLINE HYCLATE 100 MG PO TABS: 100.0000 mg | ORAL_TABLET | Freq: Two times a day (BID) | ORAL | 0 refills | Status: DC

## 2018-04-24 NOTE — Telephone Encounter (Signed)
Nicole Norton reports the rash is now spreading to both legs and moving up to the thighs. The rash is itching and she is unable to sleep.  Denies fever, chills, sweats and nausea. She will be in later for her husbands appointment.

## 2018-04-24 NOTE — Telephone Encounter (Signed)
She was here with husband today.  The rash on her lower legs is not improving in fact she feels like it is getting a little bit worse.  Still weeping a yellow fluid.  She still has significant swelling.  We did try switching to Bumex but then she actually switch back to Lasix this morning.  She is not doing any compression.  Reminded her that she needs to be doing some type of compression on her legs even an Ace wrap would be helpful it will improve healing greatly.  Otherwise we may end up having to do Unna boots.  I am going to send over doxycycline to cover for MRSA in addition to some mupirocin ointment as it does look like she has some impetigo going on as well.

## 2018-04-27 DIAGNOSIS — R21 Rash and other nonspecific skin eruption: Secondary | ICD-10-CM | POA: Diagnosis not present

## 2018-04-27 DIAGNOSIS — L309 Dermatitis, unspecified: Secondary | ICD-10-CM | POA: Diagnosis not present

## 2018-04-27 DIAGNOSIS — L271 Localized skin eruption due to drugs and medicaments taken internally: Secondary | ICD-10-CM | POA: Diagnosis not present

## 2018-05-11 DIAGNOSIS — L270 Generalized skin eruption due to drugs and medicaments taken internally: Secondary | ICD-10-CM | POA: Diagnosis not present

## 2018-05-24 DIAGNOSIS — M47817 Spondylosis without myelopathy or radiculopathy, lumbosacral region: Secondary | ICD-10-CM | POA: Diagnosis not present

## 2018-05-24 DIAGNOSIS — M47812 Spondylosis without myelopathy or radiculopathy, cervical region: Secondary | ICD-10-CM | POA: Diagnosis not present

## 2018-05-24 DIAGNOSIS — G894 Chronic pain syndrome: Secondary | ICD-10-CM | POA: Diagnosis not present

## 2018-05-24 DIAGNOSIS — M170 Bilateral primary osteoarthritis of knee: Secondary | ICD-10-CM | POA: Diagnosis not present

## 2018-05-24 DIAGNOSIS — Z79891 Long term (current) use of opiate analgesic: Secondary | ICD-10-CM | POA: Diagnosis not present

## 2018-06-04 ENCOUNTER — Other Ambulatory Visit: Payer: Self-pay | Admitting: Cardiology

## 2018-06-08 ENCOUNTER — Other Ambulatory Visit: Payer: Self-pay | Admitting: *Deleted

## 2018-06-08 MED ORDER — ESTROGENS CONJUGATED 0.625 MG PO TABS: ORAL_TABLET | ORAL | 1 refills | Status: DC

## 2018-06-09 DIAGNOSIS — M170 Bilateral primary osteoarthritis of knee: Secondary | ICD-10-CM | POA: Diagnosis not present

## 2018-06-12 DIAGNOSIS — R898 Other abnormal findings in specimens from other organs, systems and tissues: Secondary | ICD-10-CM | POA: Insufficient documentation

## 2018-06-12 DIAGNOSIS — D721 Eosinophilia: Secondary | ICD-10-CM

## 2018-06-12 DIAGNOSIS — J82 Pulmonary eosinophilia, not elsewhere classified: Secondary | ICD-10-CM | POA: Diagnosis not present

## 2018-06-12 DIAGNOSIS — R21 Rash and other nonspecific skin eruption: Secondary | ICD-10-CM | POA: Insufficient documentation

## 2018-06-18 ENCOUNTER — Other Ambulatory Visit: Payer: Self-pay | Admitting: Family Medicine

## 2018-06-21 ENCOUNTER — Ambulatory Visit (INDEPENDENT_AMBULATORY_CARE_PROVIDER_SITE_OTHER): Payer: Medicare Other | Admitting: Cardiology

## 2018-06-21 ENCOUNTER — Encounter: Payer: Self-pay | Admitting: Cardiology

## 2018-06-21 VITALS — BP 132/94 | HR 71 | Ht 65.0 in | Wt 244.8 lb

## 2018-06-21 DIAGNOSIS — I480 Paroxysmal atrial fibrillation: Secondary | ICD-10-CM

## 2018-06-21 DIAGNOSIS — Z79899 Other long term (current) drug therapy: Secondary | ICD-10-CM

## 2018-06-21 DIAGNOSIS — I10 Essential (primary) hypertension: Secondary | ICD-10-CM

## 2018-06-21 MED ORDER — HYDROCHLOROTHIAZIDE 25 MG PO TABS: 25.0000 mg | ORAL_TABLET | Freq: Every day | ORAL | 6 refills | Status: DC
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
## 2018-06-21 NOTE — Patient Instructions (Signed)
Medication Instructions:  Your physician has recommended you make the following change in your medication:  1. START Hydrochlorothiazide 25 mg once daily  * If you need a refill on your cardiac medications before your next appointment, please call your pharmacy.   Labwork: Your physician recommends that you return for lab work in: 2-3 weeks for a BMET  Testing/Procedures: None ordered  Follow-Up: Your physician wants you to follow-up in: 6 months with Dr. Curt Bears.  You will receive a reminder letter in the mail two months in advance. If you don't receive a letter, please call our office to schedule the follow-up appointment.  *Please note that any paperwork needing to be filled out by the provider will need to be addressed at the front desk prior to seeing the provider. Please note that any FMLA, disability or other documents regarding health condition is subject to a $25.00 charge that must be received prior to completion of paperwork in the form of a money order or check.  Thank you for choosing [facility]!!   Trinidad Curet, RN [phone]  Any Other Special Instructions Will Be Listed Below (If Applicable).  Hydrochlorothiazide, HCTZ capsules or tablets What is this medicine? HYDROCHLOROTHIAZIDE (hye droe klor oh THYE a zide) is a diuretic. It increases the amount of urine passed, which causes the body to lose salt and water. This medicine is used to treat high blood pressure. It is also reduces the swelling and water retention caused by various medical conditions, such as heart, liver, or kidney disease. This medicine may be used for other purposes; ask your health care provider or pharmacist if you have questions. COMMON BRAND NAME(S): Esidrix, Ezide, HydroDIURIL, Microzide, Oretic, Zide What should I tell my health care provider before I take this medicine? They need to know if you have any of these conditions: -diabetes -gout -immune system problems, like lupus -kidney  disease or kidney stones -liver disease -pancreatitis -small amount of urine or difficulty passing urine -an unusual or allergic reaction to hydrochlorothiazide, sulfa drugs, other medicines, foods, dyes, or preservatives -pregnant or trying to get pregnant -breast-feeding How should I use this medicine? Take this medicine by mouth with a glass of water. Follow the directions on the prescription label. Take your medicine at regular intervals. Remember that you will need to pass urine frequently after taking this medicine. Do not take your doses at a time of day that will cause you problems. Do not stop taking your medicine unless your doctor tells you to. Talk to your pediatrician regarding the use of this medicine in children. Special care may be needed. Overdosage: If you think you have taken too much of this medicine contact a poison control center or emergency room at once. NOTE: This medicine is only for you. Do not share this medicine with others. What if I miss a dose? If you miss a dose, take it as soon as you can. If it is almost time for your next dose, take only that dose. Do not take double or extra doses. What may interact with this medicine? -cholestyramine -colestipol -digoxin -dofetilide -lithium -medicines for blood pressure -medicines for diabetes -medicines that relax muscles for surgery -other diuretics -steroid medicines like prednisone or cortisone This list may not describe all possible interactions. Give your health care provider a list of all the medicines, herbs, non-prescription drugs, or dietary supplements you use. Also tell them if you smoke, drink alcohol, or use illegal drugs. Some items may interact with your medicine. What  should I watch for while using this medicine? Visit your doctor or health care professional for regular checks on your progress. Check your blood pressure as directed. Ask your doctor or health care professional what your blood pressure  should be and when you should contact him or her. You may need to be on a special diet while taking this medicine. Ask your doctor. Check with your doctor or health care professional if you get an attack of severe diarrhea, nausea and vomiting, or if you sweat a lot. The loss of too much body fluid can make it dangerous for you to take this medicine. You may get drowsy or dizzy. Do not drive, use machinery, or do anything that needs mental alertness until you know how this medicine affects you. Do not stand or sit up quickly, especially if you are an older patient. This reduces the risk of dizzy or fainting spells. Alcohol may interfere with the effect of this medicine. Avoid alcoholic drinks. This medicine may affect your blood sugar level. If you have diabetes, check with your doctor or health care professional before changing the dose of your diabetic medicine. This medicine can make you more sensitive to the sun. Keep out of the sun. If you cannot avoid being in the sun, wear protective clothing and use sunscreen. Do not use sun lamps or tanning beds/booths. What side effects may I notice from receiving this medicine? Side effects that you should report to your doctor or health care professional as soon as possible: -allergic reactions such as skin rash or itching, hives, swelling of the lips, mouth, tongue, or throat -changes in vision -chest pain -eye pain -fast or irregular heartbeat -feeling faint or lightheaded, falls -gout attack -muscle pain or cramps -pain or difficulty when passing urine -pain, tingling, numbness in the hands or feet -redness, blistering, peeling or loosening of the skin, including inside the mouth -unusually weak or tired Side effects that usually do not require medical attention (report to your doctor or health care professional if they continue or are bothersome): -change in sex drive or performance -dry mouth -headache -stomach upset This list may not  describe all possible side effects. Call your doctor for medical advice about side effects. You may report side effects to FDA at 1-800-FDA-1088. Where should I keep my medicine? Keep out of the reach of children. Store at room temperature between 15 and 30 degrees C (59 and 86 degrees F). Do not freeze. Protect from light and moisture. Keep container closed tightly. Throw away any unused medicine after the expiration date. NOTE: This sheet is a summary. It may not cover all possible information. If you have questions about this medicine, talk to your doctor, pharmacist, or health care provider.  2018 Elsevier/Gold Standard (2010-06-26 12:57:37)

## 2018-06-23 DIAGNOSIS — M47817 Spondylosis without myelopathy or radiculopathy, lumbosacral region: Secondary | ICD-10-CM | POA: Diagnosis not present

## 2018-06-23 DIAGNOSIS — M47812 Spondylosis without myelopathy or radiculopathy, cervical region: Secondary | ICD-10-CM | POA: Diagnosis not present

## 2018-06-23 DIAGNOSIS — G894 Chronic pain syndrome: Secondary | ICD-10-CM | POA: Diagnosis not present

## 2018-06-23 DIAGNOSIS — M170 Bilateral primary osteoarthritis of knee: Secondary | ICD-10-CM | POA: Diagnosis not present

## 2018-07-03 DIAGNOSIS — M170 Bilateral primary osteoarthritis of knee: Secondary | ICD-10-CM | POA: Diagnosis not present

## 2018-07-05 ENCOUNTER — Other Ambulatory Visit: Payer: Self-pay

## 2018-07-18 ENCOUNTER — Other Ambulatory Visit: Payer: Medicare Other | Admitting: *Deleted

## 2018-07-18 ENCOUNTER — Other Ambulatory Visit: Payer: Self-pay | Admitting: Family Medicine

## 2018-07-18 DIAGNOSIS — Z1231 Encounter for screening mammogram for malignant neoplasm of breast: Secondary | ICD-10-CM

## 2018-07-18 DIAGNOSIS — R0902 Hypoxemia: Secondary | ICD-10-CM | POA: Diagnosis not present

## 2018-07-18 DIAGNOSIS — J449 Chronic obstructive pulmonary disease, unspecified: Secondary | ICD-10-CM | POA: Diagnosis not present

## 2018-07-18 DIAGNOSIS — I480 Paroxysmal atrial fibrillation: Secondary | ICD-10-CM

## 2018-07-18 DIAGNOSIS — Z79899 Other long term (current) drug therapy: Secondary | ICD-10-CM

## 2018-07-19 LAB — BASIC METABOLIC PANEL
BUN/Creatinine Ratio: 27 (ref 12–28)
BUN: 31 mg/dL — ABNORMAL HIGH (ref 8–27)
CALCIUM: 9.4 mg/dL (ref 8.7–10.3)
CO2: 26 mmol/L (ref 20–29)
Chloride: 105 mmol/L (ref 96–106)
Creatinine, Ser: 1.15 mg/dL — ABNORMAL HIGH (ref 0.57–1.00)
GFR calc Af Amer: 54 mL/min/{1.73_m2} — ABNORMAL LOW (ref >59)
GFR calc non Af Amer: 47 mL/min/{1.73_m2} — ABNORMAL LOW (ref >59)
GLUCOSE: 82 mg/dL (ref 65–99)
POTASSIUM: 4.6 mmol/L (ref 3.5–5.2)
SODIUM: 147 mmol/L — AB (ref 134–144)

## 2018-07-27 DIAGNOSIS — G894 Chronic pain syndrome: Secondary | ICD-10-CM | POA: Diagnosis not present

## 2018-07-27 DIAGNOSIS — M47812 Spondylosis without myelopathy or radiculopathy, cervical region: Secondary | ICD-10-CM | POA: Diagnosis not present

## 2018-07-27 DIAGNOSIS — M47817 Spondylosis without myelopathy or radiculopathy, lumbosacral region: Secondary | ICD-10-CM | POA: Diagnosis not present

## 2018-07-27 DIAGNOSIS — M170 Bilateral primary osteoarthritis of knee: Secondary | ICD-10-CM | POA: Diagnosis not present

## 2018-07-28 DIAGNOSIS — D721 Eosinophilia: Secondary | ICD-10-CM | POA: Diagnosis not present

## 2018-08-12 ENCOUNTER — Other Ambulatory Visit: Payer: Self-pay | Admitting: Cardiology

## 2018-08-28 DIAGNOSIS — G894 Chronic pain syndrome: Secondary | ICD-10-CM | POA: Diagnosis not present

## 2018-08-28 DIAGNOSIS — M47817 Spondylosis without myelopathy or radiculopathy, lumbosacral region: Secondary | ICD-10-CM | POA: Diagnosis not present

## 2018-08-28 DIAGNOSIS — M47812 Spondylosis without myelopathy or radiculopathy, cervical region: Secondary | ICD-10-CM | POA: Diagnosis not present

## 2018-08-28 DIAGNOSIS — M170 Bilateral primary osteoarthritis of knee: Secondary | ICD-10-CM | POA: Diagnosis not present

## 2018-08-31 ENCOUNTER — Encounter: Payer: Self-pay | Admitting: Family Medicine

## 2018-08-31 ENCOUNTER — Ambulatory Visit (INDEPENDENT_AMBULATORY_CARE_PROVIDER_SITE_OTHER): Payer: Medicare Other | Admitting: Family Medicine

## 2018-08-31 DIAGNOSIS — Z23 Encounter for immunization: Secondary | ICD-10-CM

## 2018-08-31 NOTE — Progress Notes (Signed)
Pt here for flu shot. Afebrile,no recent illness. Vaccination given, pt tolerated well..Vlasta Baskin Lynetta, CMA  

## 2018-09-06 ENCOUNTER — Ambulatory Visit
Admission: RE | Admit: 2018-09-06 | Discharge: 2018-09-06 | Disposition: A | Discharge status: Home / Self Care | Payer: Medicare Other | Source: Ambulatory Visit | Attending: Family Medicine | Admitting: Family Medicine

## 2018-09-06 DIAGNOSIS — Z1231 Encounter for screening mammogram for malignant neoplasm of breast: Secondary | ICD-10-CM | POA: Diagnosis not present

## 2018-09-19 ENCOUNTER — Other Ambulatory Visit: Payer: Self-pay | Admitting: Family Medicine

## 2018-09-21 ENCOUNTER — Encounter: Payer: Self-pay | Admitting: Family Medicine

## 2018-09-21 ENCOUNTER — Ambulatory Visit (INDEPENDENT_AMBULATORY_CARE_PROVIDER_SITE_OTHER): Payer: Medicare Other | Admitting: Family Medicine

## 2018-09-21 VITALS — BP 120/78 | HR 68 | Ht 65.0 in | Wt 250.0 lb

## 2018-09-21 DIAGNOSIS — E611 Iron deficiency: Secondary | ICD-10-CM

## 2018-09-21 DIAGNOSIS — M109 Gout, unspecified: Secondary | ICD-10-CM | POA: Diagnosis not present

## 2018-09-21 DIAGNOSIS — E559 Vitamin D deficiency, unspecified: Secondary | ICD-10-CM

## 2018-09-21 DIAGNOSIS — H1032 Unspecified acute conjunctivitis, left eye: Secondary | ICD-10-CM

## 2018-09-21 DIAGNOSIS — H01004 Unspecified blepharitis left upper eyelid: Secondary | ICD-10-CM

## 2018-09-21 DIAGNOSIS — S81801A Unspecified open wound, right lower leg, initial encounter: Secondary | ICD-10-CM

## 2018-09-21 DIAGNOSIS — R5383 Other fatigue: Secondary | ICD-10-CM

## 2018-09-21 DIAGNOSIS — R454 Irritability and anger: Secondary | ICD-10-CM | POA: Diagnosis not present

## 2018-09-21 DIAGNOSIS — R7989 Other specified abnormal findings of blood chemistry: Secondary | ICD-10-CM | POA: Diagnosis not present

## 2018-09-21 DIAGNOSIS — E039 Hypothyroidism, unspecified: Secondary | ICD-10-CM

## 2018-09-21 MED ORDER — FUROSEMIDE 20 MG PO TABS: 20.0000 mg | ORAL_TABLET | Freq: Every day | ORAL | 0 refills | Status: DC | PRN

## 2018-09-21 MED ORDER — PAROXETINE HCL 10 MG PO TABS: ORAL_TABLET | ORAL | 0 refills | Status: DC

## 2018-09-21 MED ORDER — AZITHROMYCIN 1 % OP SOLN: OPHTHALMIC | 0 refills | Status: DC

## 2018-09-21 NOTE — Progress Notes (Signed)
Subjective:    Patient ID: Nicole Norton, female    DOB: 07/26/1941, 77 y.o.   MRN: [id number]  HPI 77 year old female comes in today complaining that her left eye has been irritated and swollen and red for the last 4 days.  It mainly is affecting the upper eyelid.  She just woke up one morning and noticed that it was pink and that there was some crusting in the corners of her eye.  Since then it has become a little bit more swollen and more red versus pink.  She says it is uncomfortable.  She has not had any significant vision changes.  She is been using refresh eyedrops and says that does actually help a little bit.  No fevers chills or sweats.  She is on chronic steroids currently taking 20 mg daily.  Reports that she is been feeling extremely fatigued even before the eye symptoms started.  She was to go ahead and get up-to-date blood work to see if there needs anything going on that could be causing it.  She does have a history of low vitamin D levels and her last TSH about 6 or 7 months ago was slightly off.  Hypothyroidism - Taking medication regularly in the AM away from food and vitamins, etc. No recent change to skin, hair, or energy levels.  She also reports a wound on her right lateral leg.  She says she just woke up one morning and noticed blood everywhere she is not exactly sure how it happened.  She is been cleaning it with peroxide and keeping it covered.  He also feels like her anxiety levels are extremely high right now.  She thinks she is at the point where she might need to be on medication.  She is used benzodiazepines in the past as needed but understands there is a increased risk for dementia with those and is okay with trying something different.  Sleep complaining of increased irritability and having a short fuse.  Wanted to give me an update on the rash that she was experiencing.  She had a biopsy done.  Her pulmonologist felt like it was related to her high  eosinophils.  Her dermatologist felt like it was likely a drug reaction.  They thought that it could actually be coming from her furosemide.  Review of Systems     Objective:   Physical Exam  Constitutional: She is oriented to person, place, and time. She appears well-developed and well-nourished.  HENT:  Head: Normocephalic and atraumatic.  Eyes: Pupils are equal, round, and reactive to light. Conjunctivae and EOM are normal. Right eye exhibits no discharge. Left eye exhibits discharge.  Conjunctiva mildly injected on the left eye.  The upper eyelid is erythematous and swollen.  It almost looks a little dry as well.  There is some yellow crusting in the corners.  Cardiovascular: Normal rate.  Pulmonary/Chest: Effort normal.  Musculoskeletal:  Her left lower leg she has a V-shaped laceration that appears to actually be healing well.  No significant erythema or edema around it.  No drainage from the wound.  Neurological: She is alert and oriented to person, place, and time.  Skin: Skin is dry. No pallor.  Psychiatric: She has a normal mood and affect. Her behavior is normal.  Vitals reviewed.      Assessment & Plan:  Conjunctivitis/blepharitis -treat with azithromycin eyedrops and cool compresses.  Call if not improving after the weekend.  Anxiety-irritability-recommend a trial of fluoxetine.  New  prescription sent to pharmacy we will start with 10 mg and go up to 20 mg after 1 week.  Right lateral leg wound -appears to be a clean wound she is doing a great job.  Discontinue hydrogen peroxide as it can affect granulation tissue.  Just clean with a little bit of Dial soap with fingertips pat dry and apply Vaseline twice a day.  Call if not improving or if the wound starts to look red or swollen. Hypothyroid -due to recheck TSH levels.  Will adjust dose if needed.  Rash  -Unclear etiology still not sure if drug rash or not.  Certainly if she retracts the furosemide and the rash returns  then we know the culprit.  If not it may be another drug that may have triggered it.

## 2018-09-22 LAB — COMPLETE METABOLIC PANEL WITH GFR
AG RATIO: 1.9 (calc) (ref 1.0–2.5)
ALBUMIN MSPROF: 3.9 g/dL (ref 3.6–5.1)
ALT: 12 U/L (ref 6–29)
AST: 12 U/L (ref 10–35)
Alkaline phosphatase (APISO): 55 U/L (ref 33–130)
BILIRUBIN TOTAL: 0.4 mg/dL (ref 0.2–1.2)
BUN / CREAT RATIO: 14 (calc) (ref 6–22)
BUN: 23 mg/dL (ref 7–25)
CALCIUM: 9.2 mg/dL (ref 8.6–10.4)
CHLORIDE: 104 mmol/L (ref 98–110)
CO2: 32 mmol/L (ref 20–32)
Creat: 1.6 mg/dL — ABNORMAL HIGH (ref 0.60–0.93)
GFR, EST AFRICAN AMERICAN: 36 mL/min/{1.73_m2} — AB (ref >=60)
GFR, EST NON AFRICAN AMERICAN: 31 mL/min/{1.73_m2} — AB (ref >=60)
Globulin: 2.1 g/dL (calc) (ref 1.9–3.7)
Glucose, Bld: 83 mg/dL (ref 65–99)
POTASSIUM: 4.6 mmol/L (ref 3.5–5.3)
Sodium: 145 mmol/L (ref 135–146)
TOTAL PROTEIN: 6 g/dL — AB (ref 6.1–8.1)

## 2018-09-22 LAB — B12 AND FOLATE PANEL
Folate: 18.6 ng/mL
Vitamin B-12: 912 pg/mL (ref 200–1100)

## 2018-09-22 LAB — CBC WITH DIFFERENTIAL/PLATELET
BASOS PCT: 0.7 %
Basophils Absolute: 48 cells/uL (ref 0–200)
EOS PCT: 0.9 %
Eosinophils Absolute: 62 cells/uL (ref 15–500)
HCT: 37.8 % (ref 35.0–45.0)
Hemoglobin: 12.8 g/dL (ref 11.7–15.5)
Lymphs Abs: 2070 cells/uL (ref 850–3900)
MCH: 34.6 pg — ABNORMAL HIGH (ref 27.0–33.0)
MCHC: 33.9 g/dL (ref 32.0–36.0)
MCV: 102.2 fL — ABNORMAL HIGH (ref 80.0–100.0)
MONOS PCT: 8.3 %
MPV: 10.4 fL (ref 7.5–12.5)
Neutro Abs: 4147 cells/uL (ref 1500–7800)
Neutrophils Relative %: 60.1 %
PLATELETS: 260 10*3/uL (ref 140–400)
RBC: 3.7 10*6/uL — AB (ref 3.80–5.10)
RDW: 13.1 % (ref 11.0–15.0)
TOTAL LYMPHOCYTE: 30 %
WBC mixed population: 573 cells/uL (ref 200–950)
WBC: 6.9 10*3/uL (ref 3.8–10.8)

## 2018-09-22 LAB — URIC ACID: URIC ACID, SERUM: 9.9 mg/dL — AB (ref 2.5–7.0)

## 2018-09-22 LAB — TSH: TSH: 1.52 m[IU]/L (ref 0.40–4.50)

## 2018-09-22 LAB — IRON: Iron: 90 ug/dL (ref 45–160)

## 2018-09-22 LAB — VITAMIN D 25 HYDROXY (VIT D DEFICIENCY, FRACTURES): VIT D 25 HYDROXY: 22 ng/mL — AB (ref 30–100)

## 2018-09-22 NOTE — Addendum Note (Signed)
Addended by: Beatrice Lecher D on: 09/22/2018 08:13 AM   Modules accepted: Orders

## 2018-09-28 ENCOUNTER — Telehealth: Payer: Self-pay | Admitting: *Deleted

## 2018-09-28 ENCOUNTER — Encounter: Payer: Self-pay | Admitting: Family Medicine

## 2018-09-28 ENCOUNTER — Ambulatory Visit (INDEPENDENT_AMBULATORY_CARE_PROVIDER_SITE_OTHER): Payer: Medicare Other | Admitting: Family Medicine

## 2018-09-28 ENCOUNTER — Ambulatory Visit (INDEPENDENT_AMBULATORY_CARE_PROVIDER_SITE_OTHER): Payer: Medicare Other

## 2018-09-28 VITALS — BP 112/62 | HR 58 | Ht 65.0 in | Wt 248.0 lb

## 2018-09-28 DIAGNOSIS — N179 Acute kidney failure, unspecified: Secondary | ICD-10-CM

## 2018-09-28 DIAGNOSIS — R5383 Other fatigue: Secondary | ICD-10-CM

## 2018-09-28 DIAGNOSIS — R7989 Other specified abnormal findings of blood chemistry: Secondary | ICD-10-CM

## 2018-09-28 DIAGNOSIS — R0602 Shortness of breath: Secondary | ICD-10-CM

## 2018-09-28 DIAGNOSIS — I482 Chronic atrial fibrillation, unspecified: Secondary | ICD-10-CM | POA: Diagnosis not present

## 2018-09-28 LAB — POCT URINALYSIS DIPSTICK
Blood, UA: NEGATIVE
Glucose, UA: POSITIVE — AB
LEUKOCYTES UA: NEGATIVE
NITRITE UA: NEGATIVE
Protein, UA: POSITIVE — AB
UROBILINOGEN UA: 0.2 U/dL
pH, UA: 5 (ref 5.0–8.0)

## 2018-09-28 LAB — POCT UA - MICROALBUMIN
Creatinine, POC: 300 mg/dL
MICROALBUMIN (UR) POC: 150 mg/L

## 2018-09-28 MED ORDER — SUCRALFATE 1 GM/10ML PO SUSP: 1.0000 g | ORAL | 3 refills | Status: DC | PRN

## 2018-09-28 MED ORDER — ESTROGENS CONJUGATED 0.625 MG PO TABS: 0.6250 mg | ORAL_TABLET | Freq: Every day | ORAL | 0 refills | Status: DC | PRN
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
## 2018-09-28 NOTE — Telephone Encounter (Signed)
Called pt and informed her that she will need an A1c done. Pt advised to come in tomorrow. appt made for 1000 AM for nurse visit. Pt asked if the labs can check for pakinson's this cannot be done with blood this is a neurological test.Cortnie Ringel, [name], CMA

## 2018-09-29 ENCOUNTER — Other Ambulatory Visit (INDEPENDENT_AMBULATORY_CARE_PROVIDER_SITE_OTHER): Payer: Medicare Other

## 2018-09-29 ENCOUNTER — Emergency Department (HOSPITAL_COMMUNITY): Payer: Medicare Other

## 2018-09-29 ENCOUNTER — Other Ambulatory Visit: Payer: Self-pay

## 2018-09-29 ENCOUNTER — Ambulatory Visit: Payer: Medicare Other

## 2018-09-29 ENCOUNTER — Observation Stay (HOSPITAL_COMMUNITY)
Admission: EM | Admit: 2018-09-29 | Discharge: 2018-09-30 | Disposition: A | Discharge status: Home / Self Care | Payer: Medicare Other | Attending: Internal Medicine | Admitting: Internal Medicine

## 2018-09-29 ENCOUNTER — Encounter (HOSPITAL_COMMUNITY): Payer: Self-pay

## 2018-09-29 DIAGNOSIS — I499 Cardiac arrhythmia, unspecified: Secondary | ICD-10-CM | POA: Diagnosis present

## 2018-09-29 DIAGNOSIS — E669 Obesity, unspecified: Secondary | ICD-10-CM | POA: Diagnosis present

## 2018-09-29 DIAGNOSIS — R0602 Shortness of breath: Secondary | ICD-10-CM | POA: Diagnosis not present

## 2018-09-29 DIAGNOSIS — N179 Acute kidney failure, unspecified: Principal | ICD-10-CM | POA: Diagnosis present

## 2018-09-29 DIAGNOSIS — E785 Hyperlipidemia, unspecified: Secondary | ICD-10-CM | POA: Insufficient documentation

## 2018-09-29 DIAGNOSIS — E860 Dehydration: Secondary | ICD-10-CM | POA: Diagnosis not present

## 2018-09-29 DIAGNOSIS — Z888 Allergy status to other drugs, medicaments and biological substances status: Secondary | ICD-10-CM | POA: Insufficient documentation

## 2018-09-29 DIAGNOSIS — I9589 Other hypotension: Secondary | ICD-10-CM

## 2018-09-29 DIAGNOSIS — F418 Other specified anxiety disorders: Secondary | ICD-10-CM | POA: Insufficient documentation

## 2018-09-29 DIAGNOSIS — Z79891 Long term (current) use of opiate analgesic: Secondary | ICD-10-CM | POA: Insufficient documentation

## 2018-09-29 DIAGNOSIS — I10 Essential (primary) hypertension: Secondary | ICD-10-CM | POA: Diagnosis not present

## 2018-09-29 DIAGNOSIS — Z7901 Long term (current) use of anticoagulants: Secondary | ICD-10-CM | POA: Diagnosis not present

## 2018-09-29 DIAGNOSIS — G250 Essential tremor: Secondary | ICD-10-CM | POA: Insufficient documentation

## 2018-09-29 DIAGNOSIS — E861 Hypovolemia: Secondary | ICD-10-CM | POA: Diagnosis not present

## 2018-09-29 DIAGNOSIS — J82 Pulmonary eosinophilia, not elsewhere classified: Secondary | ICD-10-CM | POA: Diagnosis not present

## 2018-09-29 DIAGNOSIS — K219 Gastro-esophageal reflux disease without esophagitis: Secondary | ICD-10-CM | POA: Insufficient documentation

## 2018-09-29 DIAGNOSIS — Z79899 Other long term (current) drug therapy: Secondary | ICD-10-CM | POA: Insufficient documentation

## 2018-09-29 DIAGNOSIS — R7309 Other abnormal glucose: Secondary | ICD-10-CM | POA: Diagnosis not present

## 2018-09-29 DIAGNOSIS — Z87891 Personal history of nicotine dependence: Secondary | ICD-10-CM | POA: Diagnosis not present

## 2018-09-29 DIAGNOSIS — Z7952 Long term (current) use of systemic steroids: Secondary | ICD-10-CM | POA: Insufficient documentation

## 2018-09-29 DIAGNOSIS — Z880 Allergy status to penicillin: Secondary | ICD-10-CM | POA: Insufficient documentation

## 2018-09-29 DIAGNOSIS — R81 Glycosuria: Secondary | ICD-10-CM | POA: Diagnosis not present

## 2018-09-29 DIAGNOSIS — E039 Hypothyroidism, unspecified: Secondary | ICD-10-CM | POA: Insufficient documentation

## 2018-09-29 DIAGNOSIS — D509 Iron deficiency anemia, unspecified: Secondary | ICD-10-CM | POA: Insufficient documentation

## 2018-09-29 DIAGNOSIS — E7800 Pure hypercholesterolemia, unspecified: Secondary | ICD-10-CM | POA: Diagnosis present

## 2018-09-29 DIAGNOSIS — I482 Chronic atrial fibrillation, unspecified: Secondary | ICD-10-CM | POA: Diagnosis not present

## 2018-09-29 HISTORY — DX: Hypothyroidism, unspecified: E03.9

## 2018-09-29 HISTORY — DX: Cardiac arrhythmia, unspecified: I49.9

## 2018-09-29 HISTORY — DX: Pneumonia, unspecified organism: J18.9

## 2018-09-29 LAB — URINALYSIS, ROUTINE W REFLEX MICROSCOPIC
Bilirubin Urine: NEGATIVE
GLUCOSE, UA: NEGATIVE mg/dL
Hgb urine dipstick: NEGATIVE
Ketones, ur: NEGATIVE mg/dL
LEUKOCYTES UA: NEGATIVE
NITRITE: NEGATIVE
PROTEIN: NEGATIVE mg/dL
Specific Gravity, Urine: 1.011 (ref 1.005–1.030)
pH: 5 (ref 5.0–8.0)

## 2018-09-29 LAB — COMPREHENSIVE METABOLIC PANEL
ALT: 14 U/L (ref 0–44)
AST: 16 U/L (ref 15–41)
Albumin: 3.6 g/dL (ref 3.5–5.0)
Alkaline Phosphatase: 46 U/L (ref 38–126)
Anion gap: 15 (ref 5–15)
BUN: 48 mg/dL — ABNORMAL HIGH (ref 8–23)
CO2: 21 mmol/L — AB (ref 22–32)
Calcium: 9 mg/dL (ref 8.9–10.3)
Chloride: 101 mmol/L (ref 98–111)
Creatinine, Ser: 3.93 mg/dL — ABNORMAL HIGH (ref 0.44–1.00)
GFR calc Af Amer: 12 mL/min — ABNORMAL LOW (ref >60)
GFR calc non Af Amer: 10 mL/min — ABNORMAL LOW (ref >60)
GLUCOSE: 148 mg/dL — AB (ref 70–99)
POTASSIUM: 4.1 mmol/L (ref 3.5–5.1)
SODIUM: 137 mmol/L (ref 135–145)
Total Bilirubin: 0.6 mg/dL (ref 0.3–1.2)
Total Protein: 6.1 g/dL — ABNORMAL LOW (ref 6.5–8.1)

## 2018-09-29 LAB — POCT GLYCOSYLATED HEMOGLOBIN (HGB A1C): HbA1c POC (<> result, manual entry): 4.6 % (ref 4.0–5.6)

## 2018-09-29 LAB — BASIC METABOLIC PANEL WITH GFR
BUN/Creatinine Ratio: 12 (calc) (ref 6–22)
BUN: 41 mg/dL — ABNORMAL HIGH (ref 7–25)
CALCIUM: 9.4 mg/dL (ref 8.6–10.4)
CHLORIDE: 99 mmol/L (ref 98–110)
CO2: 28 mmol/L (ref 20–32)
Creat: 3.34 mg/dL — ABNORMAL HIGH (ref 0.60–0.93)
GFR, EST AFRICAN AMERICAN: 15 mL/min/{1.73_m2} — AB (ref >=60)
GFR, EST NON AFRICAN AMERICAN: 13 mL/min/{1.73_m2} — AB (ref >=60)
Glucose, Bld: 116 mg/dL — ABNORMAL HIGH (ref 65–99)
Potassium: 4.9 mmol/L (ref 3.5–5.3)
Sodium: 138 mmol/L (ref 135–146)

## 2018-09-29 LAB — CBC WITH DIFFERENTIAL/PLATELET
ABS IMMATURE GRANULOCYTES: 0.05 10*3/uL (ref 0.00–0.07)
BASOS ABS: 0 10*3/uL (ref 0.0–0.1)
Basophils Relative: 0 %
Eosinophils Absolute: 0 10*3/uL (ref 0.0–0.5)
Eosinophils Relative: 0 %
HCT: 41.4 % (ref 36.0–46.0)
Hemoglobin: 12.7 g/dL (ref 12.0–15.0)
IMMATURE GRANULOCYTES: 1 %
LYMPHS ABS: 0.9 10*3/uL (ref 0.7–4.0)
Lymphocytes Relative: 9 %
MCH: 33.2 pg (ref 26.0–34.0)
MCHC: 30.7 g/dL (ref 30.0–36.0)
MCV: 108.4 fL — AB (ref 80.0–100.0)
Monocytes Absolute: 0.4 10*3/uL (ref 0.1–1.0)
Monocytes Relative: 4 %
NEUTROS ABS: 9 10*3/uL — AB (ref 1.7–7.7)
NEUTROS PCT: 86 %
PLATELETS: 269 10*3/uL (ref 150–400)
RBC: 3.82 MIL/uL — ABNORMAL LOW (ref 3.87–5.11)
RDW: 13.4 % (ref 11.5–15.5)
WBC: 10.4 10*3/uL (ref 4.0–10.5)
nRBC: 0 % (ref 0.0–0.2)

## 2018-09-29 LAB — HEMOGLOBIN A1C
Hgb A1c MFr Bld: 4.9 % (ref 4.8–5.6)
Mean Plasma Glucose: 93.93 mg/dL

## 2018-09-29 MED ORDER — LORATADINE 10 MG PO TABS
10.0000 mg | ORAL_TABLET | Freq: Every day | ORAL | Status: DC
  Administered 2018-09-30: 10 mg via ORAL
  Filled 2018-09-29 (×2): qty 1

## 2018-09-29 MED ORDER — AMLODIPINE BESYLATE 5 MG PO TABS: 10.0000 mg | ORAL_TABLET | Freq: Every day | ORAL | Status: DC

## 2018-09-29 MED ORDER — PAROXETINE HCL 20 MG PO TABS
20.0000 mg | ORAL_TABLET | Freq: Every day | ORAL | Status: DC
  Administered 2018-09-30: 20 mg via ORAL
  Filled 2018-09-29 (×2): qty 1

## 2018-09-29 MED ORDER — ONDANSETRON HCL 4 MG PO TABS: 4.0000 mg | ORAL_TABLET | Freq: Four times a day (QID) | ORAL | Status: DC | PRN

## 2018-09-29 MED ORDER — APIXABAN 5 MG PO TABS
5.0000 mg | ORAL_TABLET | Freq: Two times a day (BID) | ORAL | Status: DC
  Administered 2018-09-29 – 2018-09-30 (×2): 5 mg via ORAL
  Filled 2018-09-29 (×2): qty 1

## 2018-09-29 MED ORDER — SODIUM CHLORIDE 0.9 % IV BOLUS
500.0000 mL | Freq: Once | INTRAVENOUS | Status: AC
  Administered 2018-09-29: 500 mL via INTRAVENOUS

## 2018-09-29 MED ORDER — CYANOCOBALAMIN 500 MCG PO TABS: 500.0000 ug | ORAL_TABLET | ORAL | Status: DC

## 2018-09-29 MED ORDER — PREGABALIN 75 MG PO CAPS
75.0000 mg | ORAL_CAPSULE | Freq: Three times a day (TID) | ORAL | Status: DC
  Administered 2018-09-29 – 2018-09-30 (×3): 75 mg via ORAL
  Filled 2018-09-29 (×3): qty 1

## 2018-09-29 MED ORDER — LEVOTHYROXINE SODIUM 200 MCG PO TABS
200.0000 ug | ORAL_TABLET | Freq: Every day | ORAL | Status: DC
  Administered 2018-09-30: 200 ug via ORAL
  Filled 2018-09-29: qty 2
  Filled 2018-09-29: qty 1

## 2018-09-29 MED ORDER — ONDANSETRON HCL 4 MG/2ML IJ SOLN: 4.0000 mg | Freq: Four times a day (QID) | INTRAMUSCULAR | Status: DC | PRN

## 2018-09-29 MED ORDER — SODIUM CHLORIDE 0.45 % IV SOLN
INTRAVENOUS | Status: DC
  Administered 2018-09-29: 17:00:00 via INTRAVENOUS

## 2018-09-29 MED ORDER — PREDNISONE 20 MG PO TABS
20.0000 mg | ORAL_TABLET | Freq: Every day | ORAL | Status: DC
  Administered 2018-09-30: 20 mg via ORAL
  Filled 2018-09-29: qty 1

## 2018-09-29 MED ORDER — POLYETHYLENE GLYCOL 3350 17 G PO PACK: 17.0000 g | PACK | Freq: Every day | ORAL | Status: DC | PRN

## 2018-09-29 MED ORDER — OXYCODONE HCL 5 MG PO TABS
10.0000 mg | ORAL_TABLET | Freq: Four times a day (QID) | ORAL | Status: DC
  Administered 2018-09-29 – 2018-09-30 (×2): 10 mg via ORAL
  Filled 2018-09-29 (×2): qty 2

## 2018-09-29 MED ORDER — APIXABAN 2.5 MG PO TABS: 2.5000 mg | ORAL_TABLET | Freq: Two times a day (BID) | ORAL | Status: DC

## 2018-09-29 MED ORDER — LUBIPROSTONE 24 MCG PO CAPS
24.0000 ug | ORAL_CAPSULE | Freq: Two times a day (BID) | ORAL | Status: DC
  Administered 2018-09-29 – 2018-09-30 (×2): 24 ug via ORAL
  Filled 2018-09-29 (×3): qty 1

## 2018-09-29 MED ORDER — CARVEDILOL 12.5 MG PO TABS: 25.0000 mg | ORAL_TABLET | Freq: Two times a day (BID) | ORAL | Status: DC

## 2018-09-29 MED ORDER — SODIUM CHLORIDE 0.9 % IV SOLN
INTRAVENOUS | Status: DC
  Administered 2018-09-29: 20:00:00 via INTRAVENOUS

## 2018-09-29 MED ORDER — PANTOPRAZOLE SODIUM 20 MG PO TBEC
20.0000 mg | DELAYED_RELEASE_TABLET | Freq: Every day | ORAL | Status: DC
  Administered 2018-09-30: 20 mg via ORAL
  Filled 2018-09-29: qty 1

## 2018-09-29 MED ORDER — FERROUS SULFATE 325 (65 FE) MG PO TABS
325.0000 mg | ORAL_TABLET | Freq: Two times a day (BID) | ORAL | Status: DC
  Administered 2018-09-29 – 2018-09-30 (×2): 325 mg via ORAL
  Filled 2018-09-29 (×2): qty 1

## 2018-09-29 MED ORDER — FLECAINIDE ACETATE 100 MG PO TABS
100.0000 mg | ORAL_TABLET | Freq: Two times a day (BID) | ORAL | Status: DC
  Administered 2018-09-29 – 2018-09-30 (×2): 100 mg via ORAL
  Filled 2018-09-29 (×2): qty 1

## 2018-09-29 MED ORDER — VITAMIN D 25 MCG (1000 UNIT) PO TABS
1000.0000 [IU] | ORAL_TABLET | Freq: Every day | ORAL | Status: DC
  Administered 2018-09-30: 1000 [IU] via ORAL
  Filled 2018-09-29: qty 1

## 2018-09-29 NOTE — ED Triage Notes (Addendum)
Pt states that her private MD called her and told her to go to the Emergency Dept. Pt c/os extreme fatigue  Is on O2 at night. C/o being short of breath- talking nonstop to Dr. Ralene Bathe.

## 2018-09-29 NOTE — Addendum Note (Signed)
Addended by: Beatrice Lecher D on: 09/29/2018 10:23 AM   Modules accepted: Orders
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
## 2018-09-29 NOTE — ED Notes (Signed)
Admitting at bedside 
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
## 2018-09-29 NOTE — ED Notes (Signed)
Bladder scan volume 174

## 2018-09-30 DIAGNOSIS — I10 Essential (primary) hypertension: Secondary | ICD-10-CM

## 2018-09-30 DIAGNOSIS — E039 Hypothyroidism, unspecified: Secondary | ICD-10-CM

## 2018-09-30 DIAGNOSIS — N179 Acute kidney failure, unspecified: Secondary | ICD-10-CM | POA: Diagnosis not present

## 2018-09-30 DIAGNOSIS — E785 Hyperlipidemia, unspecified: Secondary | ICD-10-CM

## 2018-09-30 LAB — TSH: TSH: 0.427 u[IU]/mL (ref 0.350–4.500)

## 2018-09-30 LAB — BASIC METABOLIC PANEL
ANION GAP: 9 (ref 5–15)
BUN: 43 mg/dL — AB (ref 8–23)
CO2: 25 mmol/L (ref 22–32)
Calcium: 8.7 mg/dL — ABNORMAL LOW (ref 8.9–10.3)
Chloride: 106 mmol/L (ref 98–111)
Creatinine, Ser: 2.02 mg/dL — ABNORMAL HIGH (ref 0.44–1.00)
GFR calc Af Amer: 27 mL/min — ABNORMAL LOW (ref >60)
GFR, EST NON AFRICAN AMERICAN: 23 mL/min — AB (ref >60)
GLUCOSE: 83 mg/dL (ref 70–99)
Potassium: 3.7 mmol/L (ref 3.5–5.1)
Sodium: 140 mmol/L (ref 135–145)

## 2018-09-30 LAB — OSMOLALITY: OSMOLALITY: 311 mosm/kg — AB (ref 275–295)

## 2018-09-30 LAB — OSMOLALITY, URINE: Osmolality, Ur: 228 mOsm/kg — ABNORMAL LOW (ref 300–900)

## 2018-09-30 LAB — NA AND K (SODIUM & POTASSIUM), RAND UR
Potassium Urine: 8 mmol/L
SODIUM UR: 39 mmol/L

## 2018-09-30 NOTE — Discharge Summary (Signed)
Physician Discharge Summary  Nicole Norton YTK:[id number] DOB: 1940/12/31 DOA: 09/29/2018  PCP: Hali Marry, MD  Admit date: 09/29/2018 Discharge date: 09/30/2018  Admitted From: Home Disposition: Home  Recommendations for Outpatient Follow-up:  1. Follow up with PCP in 1 weeks 2. Please obtain BMP/CBC in 4 days your next doctors visit.  advised to hold her Lasix, hydrochlorothiazide and olmesartan for next 4 days until seen by outpatient primary care provider and lab work has been performed.  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  Her baseline creatinine about a week ago was 1.6 and she was found to be 3.9.  There were clinical signs of dehydration therefore she was started on IV fluids during her hospitalization.  Patient admitted that she does not eat and drink enough fluids and is at very high risk of dehydration especially she is on diuretics.  Over the course of 24 hours she was aggressively hydrated and her renal function trended down to 2.0. She was putting up plenty of urine and I have advised her to keep her self orally hydrated.  Renal ultrasound was negative for any acute pathology or signs of obstruction. Today she has reached maximum benefit from an hospital stay.  I have advised her to hold off on her diuretics and ARB for next 4 days.  In about 3-4 days she needs outpatient lab work to ensure her renal function has remained stable and follow-up outpatient with primary care provider for further instructions to resume these medications.   Discharge Diagnoses:  Active  Problems:   Hypothyroidism   Hyperlipidemia LDL goal <160   Essential hypertension, benign   Obesity, Class II, BMI 35-39.9   Depression with anxiety   Gastroesophageal reflux disease without esophagitis   Cardiac arrhythmia   Hyperlipidemia   Acute renal failure (HCC)  Acute kidney injury, improved Moderate dehydration, improved - Initially her creatinine was 3.3 trended up to 3.9.  Baseline is 1.6.  Her hydrochlorothiazide and ACE inhibitors were held.  She was hydrated aggressively which improved her creatinine to 2.0.  Renal ultrasound was negative for any acute pathology. - She needs to follow-up outpatient with primary care provider and get repeat lab work in about 4 days to ensure this has continued to improve.  In the meantime she needs to hold off on taking Lasix, hydrochlorothiazide and olmesartan.  Chronic eosinophilic pneumonia   -Continue prednisone and follow-up outpatient with pulmonary  Chronic atrial fibrillation   - Continue her home regimen of anticoagulation and Coreg.  Chronic back pain -Resume home meds  Hypothyroidism -Continue Synthroid  History of anxiety and depression, in remission -Resume home meds.  DVT prophylaxis-Eliquis Discharged today in stable condition.  Discharge Instructions   Allergies as of 09/30/2018      Reactions   Allopurinol Palpitations   Uloric [febuxostat] Other (See Comments)   Episodes of AFIB   Colchicine Other (See Comments)   palpitations   Cymbalta [duloxetine Hcl] Other (See Comments)   palpitations   Etodolac Other (See Comments)   heart flutters and headaches    Penicillins    Solu-medrol [methylprednisolone Acetate]    Heart palpitations/bad headache   Statins Tinitus   REACTION: myalgias REACTION: myalgias  Lasix [furosemide] Rash      Medication List    TAKE these medications   AMBULATORY NON FORMULARY MEDICATION On continuous oxygen 2 L.  Stationary pulse ox at 88%.  Drops with activity.   Diagnosis restrictive lung disease with hypoxemia. Portable gas via nasal cannula. What changed:  additional instructions   AMITIZA 24 MCG capsule Generic drug:  lubiprostone Take 24 mcg by mouth 2 (two) times daily with a meal.   amLODipine 10 MG tablet Commonly known as:  NORVASC Take 1 tablet (10 mg total) by mouth daily.   carvedilol 25 MG tablet Commonly known as:  COREG Take 25 mg by mouth 2 (two) times daily with a meal.   CENTRUM SILVER PO Take 1 tablet by mouth daily.   cholecalciferol 25 MCG (1000 UT) tablet Commonly known as:  VITAMIN D3 Take 1,000 Units by mouth daily.   clobetasol cream 0.05 % Commonly known as:  TEMOVATE Apply topically daily as needed. What changed:    how much to take  reasons to take this   estrogens (conjugated) 0.625 MG tablet Commonly known as:  PREMARIN Take 1 tablet (0.625 mg total) by mouth daily as needed (Twice a week.). What changed:    when to take this  additional instructions   ferrous sulfate 325 (65 FE) MG tablet Take 325 mg by mouth 2 (two) times daily with a meal.   Fish Oil 1200 MG Caps Take 1,200 mg by mouth daily.   flecainide 100 MG tablet Commonly known as:  TAMBOCOR Take 1 tablet (100 mg total) by mouth 2 (two) times daily.   furosemide 20 MG tablet Commonly known as:  LASIX Take 1 tablet (20 mg total) by mouth daily as needed. What changed:  reasons to take this   hydrochlorothiazide 25 MG tablet Commonly known as:  HYDRODIURIL Take 1 tablet (25 mg total) by mouth daily.   lansoprazole 30 MG capsule Commonly known as:  PREVACID Take 30 mg by mouth daily at 12 noon.   levothyroxine 200 MCG tablet Commonly known as:  SYNTHROID, LEVOTHROID Take 1 tablet (200 mcg total) by mouth daily before breakfast. Due for labs What changed:  additional instructions   NUCALA 100 MG/ML Soaj Generic drug:  Mepolizumab Inject 100 mg into the muscle every 28 (twenty-eight) days.   olmesartan 40 MG  tablet Commonly known as:  BENICAR Take 40 mg by mouth daily.   Oxycodone HCl 10 MG Tabs Take 10 mg by mouth 4 (four) times daily.   PARoxetine 10 MG tablet Commonly known as:  PAXIL Take 1 tablet (10 mg total) by mouth daily for 8 days, THEN 2 tablets (20 mg total) daily for 22 days. Start taking on:  09/21/2018   predniSONE 20 MG tablet Commonly known as:  DELTASONE Take 20 mg by mouth daily with breakfast.   pregabalin 75 MG capsule Commonly known as:  LYRICA Take 75 mg by mouth 3 (three) times daily.   rivaroxaban 20 MG Tabs tablet Commonly known as:  XARELTO Take 20 mg by mouth daily with supper.   sucralfate 1 GM/10ML suspension Commonly known as:  CARAFATE Take 10 mLs (1 g total) by mouth as needed. What changed:  reasons to take this   vitamin B-12 500 MCG tablet Commonly known as:  CYANOCOBALAMIN Take 500 mcg by mouth every Monday, Wednesday, and Friday.   ZYRTEC 10 MG tablet Generic drug:  cetirizine Take 10 mg by mouth as needed for allergies.  Follow-up Information    Hali Marry, MD. Schedule an appointment as soon as possible for a visit in 1 week(s).   Specialty:  Family Medicine Contact information: [address] [place] [address] [phone]        Constance Haw, MD .   Specialty:  Cardiology Contact information: [address]  [place] [address] [phone]          Allergies  Allergen Reactions  . Allopurinol Palpitations  . Uloric [Febuxostat] Other (See Comments)    Episodes of AFIB  . Colchicine Other (See Comments)    palpitations  . Cymbalta [Duloxetine Hcl] Other (See Comments)    palpitations  . Etodolac Other (See Comments)    heart flutters and headaches   . Penicillins   . Solu-Medrol [Methylprednisolone Acetate]     Heart palpitations/bad headache  . Statins Tinitus    REACTION: myalgias REACTION: myalgias  . Lasix [Furosemide] Rash    You were cared  for by a hospitalist during your hospital stay. If you have any questions about your discharge medications or the care you received while you were in the hospital after you are discharged, you can call the unit and asked to speak with the hospitalist on call if the hospitalist that took care of you is not available. Once you are discharged, your primary care physician will handle any further medical issues. Please note that no refills for any discharge medications will be authorized once you are discharged, as it is imperative that you return to your primary care physician (or establish a relationship with a primary care physician if you do not have one) for your aftercare needs so that they can reassess your need for medications and monitor your lab values.  Consultations:  None   Procedures/Studies: Dg Chest 2 View  Result Date: 09/29/2018 CLINICAL DATA:  Shortness of breath. History of hypertension. EXAM: CHEST - 2 VIEW COMPARISON:  Chest x-ray dated 10/13/2017. FINDINGS: Heart size and mediastinal contours are stable. Lungs are clear. There is chronic elevation of the RIGHT hemidiaphragm. No acute or suspicious osseous finding. Mild degenerative spondylosis of the kyphotic thoracic spine. IMPRESSION: No active cardiopulmonary disease. No evidence of pneumonia or pulmonary edema. Electronically Signed   By: Franki Cabot M.D.   On: 09/29/2018 14:07   US Renal  Result Date: 09/29/2018 CLINICAL DATA:  Elevated creatinine EXAM: RENAL / URINARY TRACT ULTRASOUND COMPLETE COMPARISON:  None. FINDINGS: Right Kidney: Renal measurements: 10.2 x 5.0 x 4.9 cm = volume: 131.6 mL . Echogenicity within normal limits. No mass or hydronephrosis visualized. Left Kidney: Renal measurements: 10.4 x 5.1 x 6.8 cm = volume: 184.2 mL. Echogenicity within normal limits. No mass or hydronephrosis visualized. Bladder: Not well evaluated due to poor distention. IMPRESSION: No cause for the patient's elevated creatinine  identified. Electronically Signed   By: Dorise Bullion III M.D   On: 09/29/2018 00:50   Mm 3d Screen Breast Bilateral  Result Date: 09/06/2018 CLINICAL DATA:  Screening. EXAM: DIGITAL SCREENING BILATERAL MAMMOGRAM WITH TOMO AND CAD COMPARISON:  Previous exam(s). ACR Breast Density Category b: There are scattered areas of fibroglandular density. FINDINGS: There are no findings suspicious for malignancy. Images were processed with CAD. IMPRESSION: No mammographic evidence of malignancy. A result letter of this screening mammogram will be mailed directly to the patient. RECOMMENDATION: Screening mammogram in one year. (Code:SM-B-01Y) BI-RADS CATEGORY  1: Negative. Electronically Signed   By: Percell Locus.D.  On: 09/06/2018 17:33     Subjective: Feels better, would like to go home.  States she has been urinating well without any issues.  General = no fevers, chills, dizziness, malaise, fatigue HEENT/EYES = negative for pain, redness, loss of vision, double vision, blurred vision, loss of hearing, sore throat, hoarseness, dysphagia Cardiovascular= negative for chest pain, palpitation, murmurs, lower extremity swelling Respiratory/lungs= negative for shortness of breath, cough, hemoptysis, wheezing, mucus production Gastrointestinal= negative for nausea, vomiting,, abdominal pain, melena, hematemesis Genitourinary= negative for Dysuria, Hematuria, Change in Urinary Frequency MSK = Negative for arthralgia, myalgias, Back Pain, Joint swelling  Neurology= Negative for headache, seizures, numbness, tingling  Psychiatry= Negative for anxiety, depression, suicidal and homocidal ideation Allergy/Immunology= Medication/Food allergy as listed  Skin= Negative for Rash, lesions, ulcers, itching     Discharge Exam: Vitals:   09/30/18 0010 09/30/18 0539  BP: 113/74 126/66  Pulse: 64 (!) 54  Resp: 18 18  Temp: (!) 97.4 F (36.3 C) 97.6 F (36.4 C)  SpO2: 97% 94%   Vitals:   09/29/18 1630  09/29/18 1700 09/30/18 0010 09/30/18 0539  BP: (!) 108/53 117/69 113/74 126/66  Pulse: (!) 53 (!) 55 64 (!) 54  Resp: 14 16 18 18   Temp:  97.6 F (36.4 C) (!) 97.4 F (36.3 C) 97.6 F (36.4 C)  TempSrc:  Oral Oral Oral  SpO2: 98% 92% 97% 94%    General: Pt is alert, awake, not in acute distress Cardiovascular: RRR, S1/S2 +, no rubs, no gallops Respiratory: CTA bilaterally, no wheezing, no rhonchi Abdominal: Soft, NT, ND, bowel sounds + Extremities: no edema, no cyanosis    The results of significant diagnostics from this hospitalization (including imaging, microbiology, ancillary and laboratory) are listed below for reference.     Microbiology: No results found for this or any previous visit (from the past 240 hour(s)).   Labs: BNP (last 3 results) No results for input(s): BNP in the last 8760 hours. Basic Metabolic Panel: Recent Labs  Lab 09/28/18 1626 09/29/18 1314 09/30/18 0547  NA 138 137 140  K 4.9 4.1 3.7  CL 99 101 106  CO2 28 21* 25  GLUCOSE 116* 148* 83  BUN 41* 48* 43*  CREATININE 3.34* 3.93* 2.02*  CALCIUM 9.4 9.0 8.7*   Liver Function Tests: Recent Labs  Lab 09/29/18 1314  AST 16  ALT 14  ALKPHOS 46  BILITOT 0.6  PROT 6.1*  ALBUMIN 3.6   No results for input(s): LIPASE, AMYLASE in the last 168 hours. No results for input(s): AMMONIA in the last 168 hours. CBC: Recent Labs  Lab 09/29/18 1314  WBC 10.4  NEUTROABS 9.0*  HGB 12.7  HCT 41.4  MCV 108.4*  PLT 269   Cardiac Enzymes: No results for input(s): CKTOTAL, CKMB, CKMBINDEX, TROPONINI in the last 168 hours. BNP: Invalid input(s): POCBNP CBG: No results for input(s): GLUCAP in the last 168 hours. D-Dimer No results for input(s): DDIMER in the last 72 hours. Hgb A1c Recent Labs    09/29/18 1051 09/29/18 1323  HGBA1C 4.6 4.9   Lipid Profile No results for input(s): CHOL, HDL, LDLCALC, TRIG, CHOLHDL, LDLDIRECT in the last 72 hours. Thyroid function studies Recent Labs     09/30/18 0547  TSH 0.427   Anemia work up No results for input(s): VITAMINB12, FOLATE, FERRITIN, TIBC, IRON, RETICCTPCT in the last 72 hours. Urinalysis    Component Value Date/Time   COLORURINE YELLOW 09/29/2018 1831   APPEARANCEUR HAZY (A) 09/29/2018 1831   LABSPEC  1.011 09/29/2018 1831   PHURINE 5.0 09/29/2018 1831   GLUCOSEU NEGATIVE 09/29/2018 1831   HGBUR NEGATIVE 09/29/2018 1831   BILIRUBINUR NEGATIVE 09/29/2018 1831   BILIRUBINUR small 09/28/2018 1628   KETONESUR NEGATIVE 09/29/2018 1831   PROTEINUR NEGATIVE 09/29/2018 1831   UROBILINOGEN 0.2 09/28/2018 1628   NITRITE NEGATIVE 09/29/2018 1831   LEUKOCYTESUR NEGATIVE 09/29/2018 1831   Sepsis Labs Invalid input(s): PROCALCITONIN,  WBC,  LACTICIDVEN Microbiology No results found for this or any previous visit (from the past 240 hour(s)).   Time coordinating discharge:  I have spent 35 minutes face to face with the patient and on the ward discussing the patients care, assessment, plan and disposition with other care givers. >50% of the time was devoted counseling the patient about the risks and benefits of treatment/Discharge disposition and coordinating care.   SIGNED:   Damita Lack, MD  [facility] 09/30/2018, 11:15 AM Pager   If 7PM-7AM, please contact night-coverage [email or web address] Password [email or web address]

## 2018-10-01 ENCOUNTER — Other Ambulatory Visit: Payer: Self-pay | Admitting: Cardiology

## 2018-10-02 ENCOUNTER — Other Ambulatory Visit: Payer: Self-pay | Admitting: Family Medicine

## 2018-10-02 MED ORDER — ESTROGENS CONJUGATED 0.625 MG PO TABS: 0.6250 mg | ORAL_TABLET | Freq: Every day | ORAL | 0 refills | Status: DC | PRN

## 2018-10-10 DIAGNOSIS — R7989 Other specified abnormal findings of blood chemistry: Secondary | ICD-10-CM | POA: Diagnosis not present

## 2018-10-11 LAB — URINALYSIS, ROUTINE W REFLEX MICROSCOPIC
Bilirubin Urine: NEGATIVE
Glucose, UA: NEGATIVE
Hgb urine dipstick: NEGATIVE
KETONES UR: NEGATIVE
Leukocytes, UA: NEGATIVE
Nitrite: NEGATIVE
PH: 5.5 (ref 5.0–8.0)
Protein, ur: NEGATIVE
SPECIFIC GRAVITY, URINE: 1.008 (ref 1.001–1.03)

## 2018-10-11 LAB — MICROALBUMIN / CREATININE URINE RATIO
Creatinine, Urine: 37 mg/dL (ref 20–275)
Microalb Creat Ratio: 11 mcg/mg creat (ref <30)
Microalb, Ur: 0.4 mg/dL

## 2018-10-16 ENCOUNTER — Ambulatory Visit (INDEPENDENT_AMBULATORY_CARE_PROVIDER_SITE_OTHER): Payer: Medicare Other | Admitting: Family Medicine

## 2018-10-16 ENCOUNTER — Encounter: Payer: Self-pay | Admitting: Family Medicine

## 2018-10-16 VITALS — BP 120/70 | HR 61 | Ht 65.0 in | Wt 252.0 lb

## 2018-10-16 DIAGNOSIS — Z6841 Body Mass Index (BMI) 40.0 and over, adult: Secondary | ICD-10-CM | POA: Diagnosis not present

## 2018-10-16 DIAGNOSIS — R4586 Emotional lability: Secondary | ICD-10-CM

## 2018-10-16 DIAGNOSIS — I10 Essential (primary) hypertension: Secondary | ICD-10-CM

## 2018-10-16 DIAGNOSIS — N289 Disorder of kidney and ureter, unspecified: Secondary | ICD-10-CM | POA: Diagnosis not present

## 2018-10-16 DIAGNOSIS — R7989 Other specified abnormal findings of blood chemistry: Secondary | ICD-10-CM | POA: Diagnosis not present

## 2018-10-16 DIAGNOSIS — R5383 Other fatigue: Secondary | ICD-10-CM | POA: Diagnosis not present

## 2018-10-16 MED ORDER — FLUOXETINE HCL 10 MG PO CAPS: 10.0000 mg | ORAL_CAPSULE | Freq: Every day | ORAL | 2 refills | Status: DC

## 2018-10-16 MED ORDER — LEVOTHYROXINE SODIUM 200 MCG PO TABS: 200.0000 ug | ORAL_TABLET | Freq: Every day | ORAL | 0 refills | Status: DC
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
## 2018-10-19 LAB — BASIC METABOLIC PANEL WITH GFR
BUN/Creatinine Ratio: 16 (calc) (ref 6–22)
BUN: 20 mg/dL (ref 7–25)
CHLORIDE: 106 mmol/L (ref 98–110)
CO2: 31 mmol/L (ref 20–32)
Calcium: 9.3 mg/dL (ref 8.6–10.4)
Creat: 1.29 mg/dL — ABNORMAL HIGH (ref 0.60–0.93)
GFR, Est African American: 47 mL/min/{1.73_m2} — ABNORMAL LOW (ref >=60)
GFR, Est Non African American: 40 mL/min/{1.73_m2} — ABNORMAL LOW (ref >=60)
GLUCOSE: 96 mg/dL (ref 65–99)
POTASSIUM: 4.2 mmol/L (ref 3.5–5.3)
SODIUM: 146 mmol/L (ref 135–146)

## 2018-10-19 LAB — CBC WITH DIFFERENTIAL/PLATELET
Basophils Absolute: 46 cells/uL (ref 0–200)
Basophils Relative: 0.4 %
Eosinophils Absolute: 104 cells/uL (ref 15–500)
Eosinophils Relative: 0.9 %
HCT: 37.9 % (ref 35.0–45.0)
Hemoglobin: 12.9 g/dL (ref 11.7–15.5)
Lymphs Abs: 2335 cells/uL (ref 850–3900)
MCH: 34.4 pg — ABNORMAL HIGH (ref 27.0–33.0)
MCHC: 34 g/dL (ref 32.0–36.0)
MCV: 101.1 fL — AB (ref 80.0–100.0)
MPV: 11.4 fL (ref 7.5–12.5)
Monocytes Relative: 6 %
NEUTROS PCT: 72.4 %
Neutro Abs: 8326 cells/uL — ABNORMAL HIGH (ref 1500–7800)
PLATELETS: 226 10*3/uL (ref 140–400)
RBC: 3.75 10*6/uL — AB (ref 3.80–5.10)
RDW: 12.2 % (ref 11.0–15.0)
TOTAL LYMPHOCYTE: 20.3 %
WBC: 11.5 10*3/uL — AB (ref 3.8–10.8)
WBCMIX: 690 {cells}/uL (ref 200–950)

## 2018-10-19 LAB — VITAMIN B12: Vitamin B-12: 740 pg/mL (ref 200–1100)

## 2018-10-19 LAB — FOLATE: Folate: >24 ng/mL

## 2018-10-20 ENCOUNTER — Ambulatory Visit (HOSPITAL_COMMUNITY)
Admission: RE | Admit: 2018-10-20 | Discharge: 2018-10-20 | Disposition: A | Discharge status: Home / Self Care | Payer: Medicare Other | Source: Ambulatory Visit | Attending: Family Medicine | Admitting: Family Medicine

## 2018-10-20 DIAGNOSIS — I482 Chronic atrial fibrillation, unspecified: Secondary | ICD-10-CM | POA: Diagnosis not present

## 2018-10-20 DIAGNOSIS — I081 Rheumatic disorders of both mitral and tricuspid valves: Secondary | ICD-10-CM | POA: Diagnosis not present

## 2018-10-20 DIAGNOSIS — R5383 Other fatigue: Secondary | ICD-10-CM | POA: Diagnosis not present

## 2018-10-20 DIAGNOSIS — R0602 Shortness of breath: Secondary | ICD-10-CM | POA: Insufficient documentation

## 2018-10-20 NOTE — Progress Notes (Signed)
  Echocardiogram 2D Echocardiogram has been performed.  Nicole Norton 10/20/2018, 4:10 PM

## 2018-10-23 ENCOUNTER — Encounter: Payer: Self-pay | Admitting: Family Medicine

## 2018-10-23 DIAGNOSIS — I5189 Other ill-defined heart diseases: Secondary | ICD-10-CM | POA: Insufficient documentation

## 2018-10-30 ENCOUNTER — Ambulatory Visit: Payer: Medicare Other | Admitting: Family Medicine

## 2018-10-30 DIAGNOSIS — G894 Chronic pain syndrome: Secondary | ICD-10-CM | POA: Diagnosis not present

## 2018-10-30 DIAGNOSIS — M170 Bilateral primary osteoarthritis of knee: Secondary | ICD-10-CM | POA: Diagnosis not present

## 2018-10-30 DIAGNOSIS — M47812 Spondylosis without myelopathy or radiculopathy, cervical region: Secondary | ICD-10-CM | POA: Diagnosis not present

## 2018-10-30 DIAGNOSIS — M47817 Spondylosis without myelopathy or radiculopathy, lumbosacral region: Secondary | ICD-10-CM | POA: Diagnosis not present

## 2018-11-20 ENCOUNTER — Other Ambulatory Visit: Payer: Self-pay | Admitting: Family Medicine

## 2018-11-24 ENCOUNTER — Telehealth: Payer: Self-pay

## 2018-11-24 DIAGNOSIS — E039 Hypothyroidism, unspecified: Secondary | ICD-10-CM

## 2018-11-24 NOTE — Telephone Encounter (Signed)
Pharmacy called and reports a manufacturer change in the Levothyroxine. I called and advised patient to recheck TSH in 6-8 weeks.

## 2018-11-24 NOTE — Telephone Encounter (Signed)
Agree with documentation as above.   Aubreana Cornacchia, MD  

## 2018-11-28 ENCOUNTER — Ambulatory Visit (INDEPENDENT_AMBULATORY_CARE_PROVIDER_SITE_OTHER): Payer: Medicare Other | Admitting: Family Medicine

## 2018-11-28 ENCOUNTER — Encounter: Payer: Self-pay | Admitting: Family Medicine

## 2018-11-28 VITALS — BP 132/78 | HR 67 | Ht 65.0 in | Wt 240.0 lb

## 2018-11-28 DIAGNOSIS — J82 Pulmonary eosinophilia, not elsewhere classified: Secondary | ICD-10-CM

## 2018-11-28 DIAGNOSIS — R4586 Emotional lability: Secondary | ICD-10-CM

## 2018-11-28 DIAGNOSIS — J8281 Chronic eosinophilic pneumonia: Secondary | ICD-10-CM

## 2018-11-28 MED ORDER — FLUOXETINE HCL 20 MG PO CAPS: 20.0000 mg | ORAL_CAPSULE | Freq: Every day | ORAL | 1 refills | Status: DC

## 2018-11-28 NOTE — Progress Notes (Signed)
Subjective:    CC: Mood  HPI:  Follow-up depression-when I last saw her we decided to try fluoxetine. She has been taking it and her husband told her she needed to go up on her medication.  She went up to 20mg  on her own.  She has tolerated it well and says she feels it is helpful.  She is noticing that the little things that were really bothering her she seems to be able to let go more easily.  She does not feels frustrated and irritable.  She has lost about 12 lbs. She says has been drinking water and doesn't feel dehydrated.  She says since she started drinking more weater she has lost some weight.   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  She had been on 20 mg daily for couple of weeks when she started to feel more short of breath.  She went back up on 40 mg on her own but did notify her pulmonologist and says really within a couple days noticed a huge difference in her breathing.  She is feeling much better.    Past medical history, Surgical history, Family history not pertinant except as noted below, Social history, Allergies, and medications have been entered into the medical record, reviewed, and corrections made.   Review of Systems: No fevers, chills, night sweats, weight loss, chest pain, or shortness of breath.   Objective:    General: Well Developed, well nourished, and in no acute distress.  Neuro: Alert and oriented x3, extra-ocular muscles intact, sensation grossly intact.  HEENT: Normocephalic, atraumatic  Skin: Warm and dry, no rashes. Cardiac: Regular rate and rhythm, no murmurs rubs or gallops, no lower extremity edema.  Respiratory: Clear to auscultation bilaterally. Not using accessory muscles, speaking in full sentences.   Impression and Recommendations:    Depression -well on current regimen.  Will continue 20 mg for at least another 3 to 4 weeks.  If at that point she  wants to consider increasing her dose she can let us know.  We will need to monitor carefully as the combination of denied and fluoxetine can cause QT prolongation.  May need to consider EKG if we decide to go up on her dose to 40 mg.  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  She does have a follow-up scheduled for next week.

## 2018-11-30 DIAGNOSIS — Z79891 Long term (current) use of opiate analgesic: Secondary | ICD-10-CM | POA: Diagnosis not present

## 2018-11-30 DIAGNOSIS — M47817 Spondylosis without myelopathy or radiculopathy, lumbosacral region: Secondary | ICD-10-CM | POA: Diagnosis not present

## 2018-11-30 DIAGNOSIS — G894 Chronic pain syndrome: Secondary | ICD-10-CM | POA: Diagnosis not present

## 2018-11-30 DIAGNOSIS — M47812 Spondylosis without myelopathy or radiculopathy, cervical region: Secondary | ICD-10-CM | POA: Diagnosis not present

## 2018-11-30 DIAGNOSIS — M170 Bilateral primary osteoarthritis of knee: Secondary | ICD-10-CM | POA: Diagnosis not present

## 2018-12-08 DIAGNOSIS — M301 Polyarteritis with lung involvement [Churg-Strauss]: Secondary | ICD-10-CM

## 2018-12-08 DIAGNOSIS — D721 Eosinophilia: Secondary | ICD-10-CM | POA: Diagnosis not present

## 2018-12-08 DIAGNOSIS — D7218 Eosinophilia in diseases classified elsewhere: Secondary | ICD-10-CM | POA: Insufficient documentation

## 2018-12-09 ENCOUNTER — Other Ambulatory Visit: Payer: Self-pay | Admitting: Family Medicine

## 2018-12-11 NOTE — Telephone Encounter (Signed)
Pt's insurance plan does not cover for this medication. It will cover for omeprazole.Nicole Norton, [name], CMA

## 2018-12-21 ENCOUNTER — Other Ambulatory Visit: Payer: Self-pay | Admitting: Cardiology

## 2018-12-28 ENCOUNTER — Ambulatory Visit (INDEPENDENT_AMBULATORY_CARE_PROVIDER_SITE_OTHER): Payer: Medicare Other | Admitting: Family Medicine

## 2018-12-28 ENCOUNTER — Encounter: Payer: Self-pay | Admitting: Family Medicine

## 2018-12-28 DIAGNOSIS — Z7952 Long term (current) use of systemic steroids: Secondary | ICD-10-CM | POA: Diagnosis not present

## 2018-12-28 DIAGNOSIS — K219 Gastro-esophageal reflux disease without esophagitis: Secondary | ICD-10-CM | POA: Diagnosis not present

## 2018-12-28 DIAGNOSIS — H5789 Other specified disorders of eye and adnexa: Secondary | ICD-10-CM | POA: Diagnosis not present

## 2018-12-28 DIAGNOSIS — M301 Polyarteritis with lung involvement [Churg-Strauss]: Secondary | ICD-10-CM

## 2018-12-28 MED ORDER — LANSOPRAZOLE 30 MG PO CPDR: 30.0000 mg | DELAYED_RELEASE_CAPSULE | Freq: Every day | ORAL | 3 refills | Status: DC

## 2018-12-28 MED ORDER — AZITHROMYCIN 1 % OP SOLN: OPHTHALMIC | 0 refills | Status: DC
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
## 2018-12-28 NOTE — Assessment & Plan Note (Signed)
Significant nausea with omeprazole.  We will add to intolerance list.  We will try to initiate authorization for Prevacid she is also on chronic steroids which puts her at really high risk of GI ulcer.

## 2018-12-29 DIAGNOSIS — M170 Bilateral primary osteoarthritis of knee: Secondary | ICD-10-CM | POA: Diagnosis not present

## 2018-12-29 DIAGNOSIS — M47812 Spondylosis without myelopathy or radiculopathy, cervical region: Secondary | ICD-10-CM | POA: Diagnosis not present

## 2018-12-29 DIAGNOSIS — G894 Chronic pain syndrome: Secondary | ICD-10-CM | POA: Diagnosis not present

## 2018-12-29 DIAGNOSIS — M47817 Spondylosis without myelopathy or radiculopathy, lumbosacral region: Secondary | ICD-10-CM | POA: Diagnosis not present

## 2019-01-01 DIAGNOSIS — M301 Polyarteritis with lung involvement [Churg-Strauss]: Secondary | ICD-10-CM | POA: Diagnosis not present

## 2019-01-01 DIAGNOSIS — D721 Eosinophilia: Secondary | ICD-10-CM | POA: Diagnosis not present

## 2019-01-08 ENCOUNTER — Other Ambulatory Visit: Payer: Self-pay | Admitting: Cardiology

## 2019-01-08 NOTE — Telephone Encounter (Signed)
Pt last saw Dr Curt Bears 06/21/18, last labs 10/16/18 Creat 1.29, age 78, weight 108.9kg, CrCl 64.78, based on CrCl pt is on appropriate dosage of Xarelto 20mg  QD.  Will refill rx.

## 2019-01-16 ENCOUNTER — Ambulatory Visit (INDEPENDENT_AMBULATORY_CARE_PROVIDER_SITE_OTHER): Payer: Medicare Other | Admitting: Cardiology

## 2019-01-16 ENCOUNTER — Encounter: Payer: Self-pay | Admitting: Cardiology

## 2019-01-16 VITALS — BP 126/64 | HR 72 | Ht 65.0 in | Wt 239.0 lb

## 2019-01-16 DIAGNOSIS — I480 Paroxysmal atrial fibrillation: Secondary | ICD-10-CM | POA: Diagnosis not present
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
## 2019-01-16 NOTE — Patient Instructions (Signed)
Medication Instructions:  Your physician recommends that you continue on your current medications as directed. Please refer to the Current Medication list given to you today.  * If you need a refill on your cardiac medications before your next appointment, please call your pharmacy.   Labwork: None ordered  Testing/Procedures: None ordered  Follow-Up: Your physician wants you to follow-up in:  6 month with Dr. Curt Bears.  You will receive a reminder letter in the mail two months in advance. If you don't receive a letter, please call our office to schedule the follow-up appointment.   Thank you for choosing [facility]!!   Trinidad Curet, RN [phone]

## 2019-01-18 DIAGNOSIS — M170 Bilateral primary osteoarthritis of knee: Secondary | ICD-10-CM | POA: Diagnosis not present

## 2019-01-22 DIAGNOSIS — M170 Bilateral primary osteoarthritis of knee: Secondary | ICD-10-CM | POA: Diagnosis not present

## 2019-01-22 DIAGNOSIS — M47812 Spondylosis without myelopathy or radiculopathy, cervical region: Secondary | ICD-10-CM | POA: Diagnosis not present

## 2019-01-22 DIAGNOSIS — M47817 Spondylosis without myelopathy or radiculopathy, lumbosacral region: Secondary | ICD-10-CM | POA: Diagnosis not present

## 2019-01-22 DIAGNOSIS — G894 Chronic pain syndrome: Secondary | ICD-10-CM | POA: Diagnosis not present

## 2019-01-23 ENCOUNTER — Other Ambulatory Visit: Payer: Self-pay | Admitting: Cardiology

## 2019-01-29 DIAGNOSIS — M170 Bilateral primary osteoarthritis of knee: Secondary | ICD-10-CM | POA: Diagnosis not present

## 2019-02-05 DIAGNOSIS — M170 Bilateral primary osteoarthritis of knee: Secondary | ICD-10-CM | POA: Diagnosis not present

## 2019-02-06 ENCOUNTER — Other Ambulatory Visit: Payer: Self-pay | Admitting: Family Medicine

## 2019-02-11 ENCOUNTER — Other Ambulatory Visit: Payer: Self-pay | Admitting: Family Medicine

## 2019-02-20 DIAGNOSIS — M47812 Spondylosis without myelopathy or radiculopathy, cervical region: Secondary | ICD-10-CM | POA: Diagnosis not present

## 2019-02-20 DIAGNOSIS — M47817 Spondylosis without myelopathy or radiculopathy, lumbosacral region: Secondary | ICD-10-CM | POA: Diagnosis not present

## 2019-02-20 DIAGNOSIS — G894 Chronic pain syndrome: Secondary | ICD-10-CM | POA: Diagnosis not present

## 2019-02-20 DIAGNOSIS — Z79891 Long term (current) use of opiate analgesic: Secondary | ICD-10-CM | POA: Diagnosis not present

## 2019-02-20 DIAGNOSIS — M170 Bilateral primary osteoarthritis of knee: Secondary | ICD-10-CM | POA: Diagnosis not present

## 2019-02-22 ENCOUNTER — Other Ambulatory Visit: Payer: Self-pay

## 2019-02-22 ENCOUNTER — Inpatient Hospital Stay (HOSPITAL_COMMUNITY)
Admission: EM | Admit: 2019-02-22 | Discharge: 2019-03-02 | DRG: 291 | Disposition: A | Discharge status: Skilled Nursing Facility | Payer: Medicare Other | Attending: Internal Medicine | Admitting: Internal Medicine

## 2019-02-22 ENCOUNTER — Emergency Department (HOSPITAL_COMMUNITY): Payer: Medicare Other

## 2019-02-22 DIAGNOSIS — E785 Hyperlipidemia, unspecified: Secondary | ICD-10-CM | POA: Diagnosis present

## 2019-02-22 DIAGNOSIS — S0990XA Unspecified injury of head, initial encounter: Secondary | ICD-10-CM | POA: Diagnosis not present

## 2019-02-22 DIAGNOSIS — Y92009 Unspecified place in unspecified non-institutional (private) residence as the place of occurrence of the external cause: Secondary | ICD-10-CM | POA: Diagnosis not present

## 2019-02-22 DIAGNOSIS — Z7901 Long term (current) use of anticoagulants: Secondary | ICD-10-CM

## 2019-02-22 DIAGNOSIS — J189 Pneumonia, unspecified organism: Secondary | ICD-10-CM | POA: Diagnosis present

## 2019-02-22 DIAGNOSIS — Z888 Allergy status to other drugs, medicaments and biological substances status: Secondary | ICD-10-CM

## 2019-02-22 DIAGNOSIS — E039 Hypothyroidism, unspecified: Secondary | ICD-10-CM | POA: Diagnosis present

## 2019-02-22 DIAGNOSIS — D696 Thrombocytopenia, unspecified: Secondary | ICD-10-CM | POA: Diagnosis not present

## 2019-02-22 DIAGNOSIS — J9611 Chronic respiratory failure with hypoxia: Secondary | ICD-10-CM | POA: Diagnosis present

## 2019-02-22 DIAGNOSIS — F329 Major depressive disorder, single episode, unspecified: Secondary | ICD-10-CM | POA: Diagnosis present

## 2019-02-22 DIAGNOSIS — R0602 Shortness of breath: Secondary | ICD-10-CM | POA: Diagnosis not present

## 2019-02-22 DIAGNOSIS — N289 Disorder of kidney and ureter, unspecified: Secondary | ICD-10-CM | POA: Diagnosis not present

## 2019-02-22 DIAGNOSIS — M5136 Other intervertebral disc degeneration, lumbar region: Secondary | ICD-10-CM | POA: Diagnosis present

## 2019-02-22 DIAGNOSIS — R402412 Glasgow coma scale score 13-15, at arrival to emergency department: Secondary | ICD-10-CM | POA: Diagnosis present

## 2019-02-22 DIAGNOSIS — I5043 Acute on chronic combined systolic (congestive) and diastolic (congestive) heart failure: Secondary | ICD-10-CM

## 2019-02-22 DIAGNOSIS — Z20828 Contact with and (suspected) exposure to other viral communicable diseases: Secondary | ICD-10-CM | POA: Diagnosis present

## 2019-02-22 DIAGNOSIS — K219 Gastro-esophageal reflux disease without esophagitis: Secondary | ICD-10-CM | POA: Diagnosis present

## 2019-02-22 DIAGNOSIS — Z880 Allergy status to penicillin: Secondary | ICD-10-CM

## 2019-02-22 DIAGNOSIS — S299XXA Unspecified injury of thorax, initial encounter: Secondary | ICD-10-CM | POA: Diagnosis not present

## 2019-02-22 DIAGNOSIS — F418 Other specified anxiety disorders: Secondary | ICD-10-CM | POA: Diagnosis present

## 2019-02-22 DIAGNOSIS — M5489 Other dorsalgia: Secondary | ICD-10-CM | POA: Diagnosis not present

## 2019-02-22 DIAGNOSIS — N183 Chronic kidney disease, stage 3 unspecified: Secondary | ICD-10-CM

## 2019-02-22 DIAGNOSIS — I10 Essential (primary) hypertension: Secondary | ICD-10-CM | POA: Diagnosis present

## 2019-02-22 DIAGNOSIS — R7989 Other specified abnormal findings of blood chemistry: Secondary | ICD-10-CM | POA: Diagnosis present

## 2019-02-22 DIAGNOSIS — I130 Hypertensive heart and chronic kidney disease with heart failure and stage 1 through stage 4 chronic kidney disease, or unspecified chronic kidney disease: Principal | ICD-10-CM | POA: Diagnosis present

## 2019-02-22 DIAGNOSIS — Z79890 Hormone replacement therapy (postmenopausal): Secondary | ICD-10-CM

## 2019-02-22 DIAGNOSIS — Z91040 Latex allergy status: Secondary | ICD-10-CM

## 2019-02-22 DIAGNOSIS — M47896 Other spondylosis, lumbar region: Secondary | ICD-10-CM | POA: Diagnosis not present

## 2019-02-22 DIAGNOSIS — I444 Left anterior fascicular block: Secondary | ICD-10-CM | POA: Diagnosis present

## 2019-02-22 DIAGNOSIS — R410 Disorientation, unspecified: Secondary | ICD-10-CM | POA: Diagnosis not present

## 2019-02-22 DIAGNOSIS — Z9981 Dependence on supplemental oxygen: Secondary | ICD-10-CM

## 2019-02-22 DIAGNOSIS — M5030 Other cervical disc degeneration, unspecified cervical region: Secondary | ICD-10-CM | POA: Diagnosis present

## 2019-02-22 DIAGNOSIS — M3130 Wegener's granulomatosis without renal involvement: Secondary | ICD-10-CM | POA: Diagnosis present

## 2019-02-22 DIAGNOSIS — R69 Illness, unspecified: Secondary | ICD-10-CM

## 2019-02-22 DIAGNOSIS — I5033 Acute on chronic diastolic (congestive) heart failure: Secondary | ICD-10-CM | POA: Diagnosis not present

## 2019-02-22 DIAGNOSIS — I499 Cardiac arrhythmia, unspecified: Secondary | ICD-10-CM | POA: Diagnosis not present

## 2019-02-22 DIAGNOSIS — Z6839 Body mass index (BMI) 39.0-39.9, adult: Secondary | ICD-10-CM | POA: Diagnosis not present

## 2019-02-22 DIAGNOSIS — Z7952 Long term (current) use of systemic steroids: Secondary | ICD-10-CM

## 2019-02-22 DIAGNOSIS — W19XXXA Unspecified fall, initial encounter: Secondary | ICD-10-CM | POA: Diagnosis not present

## 2019-02-22 DIAGNOSIS — R339 Retention of urine, unspecified: Secondary | ICD-10-CM | POA: Diagnosis not present

## 2019-02-22 DIAGNOSIS — J9621 Acute and chronic respiratory failure with hypoxia: Secondary | ICD-10-CM | POA: Diagnosis present

## 2019-02-22 DIAGNOSIS — S199XXA Unspecified injury of neck, initial encounter: Secondary | ICD-10-CM | POA: Diagnosis not present

## 2019-02-22 DIAGNOSIS — I4891 Unspecified atrial fibrillation: Secondary | ICD-10-CM

## 2019-02-22 DIAGNOSIS — D849 Immunodeficiency, unspecified: Secondary | ICD-10-CM

## 2019-02-22 DIAGNOSIS — M6281 Muscle weakness (generalized): Secondary | ICD-10-CM | POA: Diagnosis present

## 2019-02-22 DIAGNOSIS — Z7401 Bed confinement status: Secondary | ICD-10-CM | POA: Diagnosis not present

## 2019-02-22 DIAGNOSIS — Z87891 Personal history of nicotine dependence: Secondary | ICD-10-CM

## 2019-02-22 DIAGNOSIS — R079 Chest pain, unspecified: Secondary | ICD-10-CM | POA: Diagnosis not present

## 2019-02-22 DIAGNOSIS — N179 Acute kidney failure, unspecified: Secondary | ICD-10-CM | POA: Diagnosis present

## 2019-02-22 DIAGNOSIS — I700 Atherosclerosis of aorta: Secondary | ICD-10-CM | POA: Diagnosis present

## 2019-02-22 DIAGNOSIS — S3993XA Unspecified injury of pelvis, initial encounter: Secondary | ICD-10-CM | POA: Diagnosis not present

## 2019-02-22 DIAGNOSIS — M2550 Pain in unspecified joint: Secondary | ICD-10-CM | POA: Diagnosis not present

## 2019-02-22 DIAGNOSIS — R531 Weakness: Secondary | ICD-10-CM | POA: Diagnosis not present

## 2019-02-22 DIAGNOSIS — R262 Difficulty in walking, not elsewhere classified: Secondary | ICD-10-CM | POA: Diagnosis present

## 2019-02-22 DIAGNOSIS — J181 Lobar pneumonia, unspecified organism: Secondary | ICD-10-CM

## 2019-02-22 DIAGNOSIS — Z79899 Other long term (current) drug therapy: Secondary | ICD-10-CM

## 2019-02-22 DIAGNOSIS — I482 Chronic atrial fibrillation, unspecified: Secondary | ICD-10-CM | POA: Diagnosis present

## 2019-02-22 DIAGNOSIS — Z79891 Long term (current) use of opiate analgesic: Secondary | ICD-10-CM

## 2019-02-22 DIAGNOSIS — F05 Delirium due to known physiological condition: Secondary | ICD-10-CM

## 2019-02-22 DIAGNOSIS — D899 Disorder involving the immune mechanism, unspecified: Secondary | ICD-10-CM | POA: Diagnosis not present

## 2019-02-22 DIAGNOSIS — R0902 Hypoxemia: Secondary | ICD-10-CM | POA: Diagnosis not present

## 2019-02-22 DIAGNOSIS — Z9181 History of falling: Secondary | ICD-10-CM

## 2019-02-22 LAB — CBC WITH DIFFERENTIAL/PLATELET
Abs Immature Granulocytes: 0.07 10*3/uL (ref 0.00–0.07)
Basophils Absolute: 0 10*3/uL (ref 0.0–0.1)
Basophils Relative: 0 %
Eosinophils Absolute: 0 10*3/uL (ref 0.0–0.5)
Eosinophils Relative: 0 %
HCT: 39.1 % (ref 36.0–46.0)
Hemoglobin: 12.6 g/dL (ref 12.0–15.0)
Immature Granulocytes: 1 %
Lymphocytes Relative: 15 %
Lymphs Abs: 1.5 10*3/uL (ref 0.7–4.0)
MCH: 34.4 pg — ABNORMAL HIGH (ref 26.0–34.0)
MCHC: 32.2 g/dL (ref 30.0–36.0)
MCV: 106.8 fL — ABNORMAL HIGH (ref 80.0–100.0)
Monocytes Absolute: 0.6 10*3/uL (ref 0.1–1.0)
Monocytes Relative: 6 %
Neutro Abs: 8.1 10*3/uL — ABNORMAL HIGH (ref 1.7–7.7)
Neutrophils Relative %: 78 %
Platelets: 160 10*3/uL (ref 150–400)
RBC: 3.66 MIL/uL — ABNORMAL LOW (ref 3.87–5.11)
RDW: 14.5 % (ref 11.5–15.5)
WBC: 10.3 10*3/uL (ref 4.0–10.5)
nRBC: 0 % (ref 0.0–0.2)

## 2019-02-22 LAB — COMPREHENSIVE METABOLIC PANEL
ALT: 26 U/L (ref 0–44)
AST: 26 U/L (ref 15–41)
Albumin: 3.1 g/dL — ABNORMAL LOW (ref 3.5–5.0)
Alkaline Phosphatase: 52 U/L (ref 38–126)
Anion gap: 12 (ref 5–15)
BUN: 34 mg/dL — ABNORMAL HIGH (ref 8–23)
CO2: 31 mmol/L (ref 22–32)
Calcium: 8.9 mg/dL (ref 8.9–10.3)
Chloride: 100 mmol/L (ref 98–111)
Creatinine, Ser: 1.16 mg/dL — ABNORMAL HIGH (ref 0.44–1.00)
GFR calc Af Amer: 53 mL/min — ABNORMAL LOW (ref >60)
GFR calc non Af Amer: 46 mL/min — ABNORMAL LOW (ref >60)
Glucose, Bld: 77 mg/dL (ref 70–99)
Potassium: 3.6 mmol/L (ref 3.5–5.1)
Sodium: 143 mmol/L (ref 135–145)
Total Bilirubin: 1.1 mg/dL (ref 0.3–1.2)
Total Protein: 5.4 g/dL — ABNORMAL LOW (ref 6.5–8.1)

## 2019-02-22 LAB — PROTIME-INR
INR: 1.1 (ref 0.8–1.2)
Prothrombin Time: 14.3 seconds (ref 11.4–15.2)

## 2019-02-22 LAB — URINALYSIS, ROUTINE W REFLEX MICROSCOPIC
Bacteria, UA: NONE SEEN
Bilirubin Urine: NEGATIVE
Glucose, UA: NEGATIVE mg/dL
Ketones, ur: 5 mg/dL — AB
Leukocytes,Ua: NEGATIVE
Nitrite: NEGATIVE
Protein, ur: NEGATIVE mg/dL
Specific Gravity, Urine: 1.017 (ref 1.005–1.030)
pH: 5 (ref 5.0–8.0)

## 2019-02-22 LAB — LACTIC ACID, PLASMA
Lactic Acid, Venous: 1.1 mmol/L (ref 0.5–1.9)
Lactic Acid, Venous: 1.3 mmol/L (ref 0.5–1.9)

## 2019-02-22 LAB — TROPONIN I
Troponin I: 0.05 ng/mL (ref <0.03)
Troponin I: 0.04 ng/mL (ref <0.03)
Troponin I: 0.03 ng/mL (ref <0.03)

## 2019-02-22 LAB — CK: Total CK: 73 U/L (ref 38–234)

## 2019-02-22 LAB — BRAIN NATRIURETIC PEPTIDE: B Natriuretic Peptide: 543.3 pg/mL — ABNORMAL HIGH (ref 0.0–100.0)

## 2019-02-22 MED ORDER — SODIUM CHLORIDE 0.9% FLUSH
3.0000 mL | Freq: Two times a day (BID) | INTRAVENOUS | Status: DC
  Administered 2019-02-22 – 2019-03-02 (×10): 3 mL via INTRAVENOUS

## 2019-02-22 MED ORDER — ONDANSETRON HCL 4 MG/2ML IJ SOLN
4.0000 mg | Freq: Four times a day (QID) | INTRAMUSCULAR | Status: DC | PRN
  Administered 2019-02-23: 05:00:00 4 mg via INTRAVENOUS
  Filled 2019-02-22: qty 2

## 2019-02-22 MED ORDER — SODIUM CHLORIDE 0.9 % IV SOLN
500.0000 mg | Freq: Once | INTRAVENOUS | Status: AC
  Administered 2019-02-22: 13:00:00 500 mg via INTRAVENOUS
  Filled 2019-02-22: qty 500

## 2019-02-22 MED ORDER — FLECAINIDE ACETATE 100 MG PO TABS
100.0000 mg | ORAL_TABLET | Freq: Two times a day (BID) | ORAL | Status: DC
  Administered 2019-02-22 – 2019-03-01 (×14): 100 mg via ORAL
  Filled 2019-02-22 (×15): qty 1

## 2019-02-22 MED ORDER — FLECAINIDE ACETATE 100 MG PO TABS
100.0000 mg | ORAL_TABLET | Freq: Once | ORAL | Status: AC
  Administered 2019-02-22: 13:00:00 100 mg via ORAL
  Filled 2019-02-22: qty 1

## 2019-02-22 MED ORDER — SODIUM CHLORIDE 0.9% FLUSH
3.0000 mL | INTRAVENOUS | Status: DC | PRN
  Filled 2019-02-22: qty 3

## 2019-02-22 MED ORDER — SODIUM CHLORIDE 0.9 % IV SOLN
1.0000 g | INTRAVENOUS | Status: DC
  Administered 2019-02-23 – 2019-02-24 (×2): 1 g via INTRAVENOUS
  Filled 2019-02-22 (×4): qty 10

## 2019-02-22 MED ORDER — IRBESARTAN 150 MG PO TABS
300.0000 mg | ORAL_TABLET | Freq: Every day | ORAL | Status: DC
  Administered 2019-02-23 – 2019-02-24 (×2): 300 mg via ORAL
  Filled 2019-02-22 (×2): qty 2

## 2019-02-22 MED ORDER — AZITHROMYCIN 250 MG PO TABS
500.0000 mg | ORAL_TABLET | Freq: Every day | ORAL | Status: AC
  Administered 2019-02-23 – 2019-02-26 (×4): 500 mg via ORAL
  Filled 2019-02-22 (×4): qty 2

## 2019-02-22 MED ORDER — RIVAROXABAN 20 MG PO TABS
20.0000 mg | ORAL_TABLET | Freq: Every day | ORAL | Status: DC
  Administered 2019-02-22 – 2019-02-27 (×6): 20 mg via ORAL
  Filled 2019-02-22 (×6): qty 1

## 2019-02-22 MED ORDER — OXYCODONE HCL 5 MG PO TABS
10.0000 mg | ORAL_TABLET | Freq: Four times a day (QID) | ORAL | Status: DC | PRN
  Administered 2019-02-22 – 2019-02-24 (×4): 10 mg via ORAL
  Filled 2019-02-22 (×4): qty 2

## 2019-02-22 MED ORDER — SODIUM CHLORIDE 0.9 % IV SOLN
250.0000 mL | INTRAVENOUS | Status: DC | PRN
  Administered 2019-02-25: 11:00:00 250 mL via INTRAVENOUS

## 2019-02-22 MED ORDER — METOPROLOL TARTRATE 5 MG/5ML IV SOLN
5.0000 mg | Freq: Once | INTRAVENOUS | Status: AC
  Administered 2019-02-22: 5 mg via INTRAVENOUS
  Filled 2019-02-22: qty 5

## 2019-02-22 MED ORDER — ACETAMINOPHEN 650 MG RE SUPP: 650.0000 mg | Freq: Four times a day (QID) | RECTAL | Status: DC | PRN

## 2019-02-22 MED ORDER — ONDANSETRON HCL 4 MG PO TABS: 4.0000 mg | ORAL_TABLET | Freq: Four times a day (QID) | ORAL | Status: DC | PRN

## 2019-02-22 MED ORDER — ACETAMINOPHEN 325 MG PO TABS
650.0000 mg | ORAL_TABLET | Freq: Four times a day (QID) | ORAL | Status: DC | PRN
  Administered 2019-02-23: 10:00:00 650 mg via ORAL
  Filled 2019-02-22: qty 2

## 2019-02-22 MED ORDER — SODIUM CHLORIDE 0.9 % IV SOLN
1.0000 g | Freq: Once | INTRAVENOUS | Status: AC
  Administered 2019-02-22: 12:00:00 1 g via INTRAVENOUS
  Filled 2019-02-22: qty 10

## 2019-02-22 MED ORDER — DILTIAZEM LOAD VIA INFUSION
15.0000 mg | Freq: Once | INTRAVENOUS | Status: AC
  Administered 2019-02-22: 15 mg via INTRAVENOUS
  Filled 2019-02-22: qty 15

## 2019-02-22 MED ORDER — METOPROLOL TARTRATE 5 MG/5ML IV SOLN
5.0000 mg | Freq: Once | INTRAVENOUS | Status: AC
  Administered 2019-02-22: 12:00:00 5 mg via INTRAVENOUS
  Filled 2019-02-22: qty 5

## 2019-02-22 MED ORDER — FLUOXETINE HCL 20 MG PO CAPS
20.0000 mg | ORAL_CAPSULE | Freq: Every day | ORAL | Status: DC
  Administered 2019-02-23 – 2019-02-27 (×5): 20 mg via ORAL
  Filled 2019-02-22 (×8): qty 1

## 2019-02-22 MED ORDER — AMLODIPINE BESYLATE 10 MG PO TABS
10.0000 mg | ORAL_TABLET | Freq: Every day | ORAL | Status: DC
  Administered 2019-02-23 – 2019-02-27 (×5): 10 mg via ORAL
  Filled 2019-02-22 (×5): qty 1

## 2019-02-22 MED ORDER — LEVOTHYROXINE SODIUM 75 MCG PO TABS
225.0000 ug | ORAL_TABLET | Freq: Every day | ORAL | Status: DC
  Administered 2019-02-23 – 2019-03-02 (×8): 225 ug via ORAL
  Filled 2019-02-22 (×8): qty 3

## 2019-02-22 MED ORDER — CARVEDILOL 25 MG PO TABS
25.0000 mg | ORAL_TABLET | Freq: Two times a day (BID) | ORAL | Status: DC
  Administered 2019-02-22 – 2019-03-01 (×13): 25 mg via ORAL
  Filled 2019-02-22 (×14): qty 1

## 2019-02-22 MED ORDER — HYDROCHLOROTHIAZIDE 25 MG PO TABS
25.0000 mg | ORAL_TABLET | Freq: Every day | ORAL | Status: DC
  Administered 2019-02-23: 25 mg via ORAL
  Filled 2019-02-22: qty 1

## 2019-02-22 MED ORDER — DILTIAZEM HCL-DEXTROSE 100-5 MG/100ML-% IV SOLN (PREMIX)
5.0000 mg/h | INTRAVENOUS | Status: DC
  Administered 2019-02-22: 23:00:00 10 mg/h via INTRAVENOUS
  Administered 2019-02-22: 14:00:00 5 mg/h via INTRAVENOUS
  Filled 2019-02-22 (×2): qty 100

## 2019-02-22 NOTE — ED Notes (Signed)
Pt is 89% on room air. Pt placed on 2L via [place] and SpO2 is 97% on 2L at present time.

## 2019-02-22 NOTE — ED Notes (Signed)
Pt bladder scanned. Reading >684 mL.
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
## 2019-02-22 NOTE — ED Triage Notes (Addendum)
Pt brought in by EMS. Pt fell out of bed yesterday afternoon around 1500 and had not been able to get up since. Pt reports that she did a lot of walking the day before yesterday. Pt reports that she has chronic knee and back pain. Pt denying any injuries from the fall. Per EMS pt was 85% on room air and heart rate was a-fib in the 140's. Pt is 89% on room air upon ED arrival. Pt reports she does have a past medical history of a-fib. Pt reports that she does take xarelto. Pt denying any chest pain or shortness of breath at present time. Pt complaining of left knee pain and back pain.

## 2019-02-22 NOTE — ED Notes (Signed)
Pt constantly bends arm and is delaying getting abx. Waiting on IV consult.
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
## 2019-02-22 NOTE — ED Notes (Signed)
This RN assisted the pt with calling her husband.

## 2019-02-23 LAB — CBC
HCT: 36.1 % (ref 36.0–46.0)
Hemoglobin: 12.1 g/dL (ref 12.0–15.0)
MCH: 34.5 pg — ABNORMAL HIGH (ref 26.0–34.0)
MCHC: 33.5 g/dL (ref 30.0–36.0)
MCV: 102.8 fL — ABNORMAL HIGH (ref 80.0–100.0)
Platelets: 122 10*3/uL — ABNORMAL LOW (ref 150–400)
RBC: 3.51 MIL/uL — ABNORMAL LOW (ref 3.87–5.11)
RDW: 13.8 % (ref 11.5–15.5)
WBC: 8.3 10*3/uL (ref 4.0–10.5)
nRBC: 0 % (ref 0.0–0.2)

## 2019-02-23 LAB — BASIC METABOLIC PANEL
Anion gap: 15 (ref 5–15)
BUN: 18 mg/dL (ref 8–23)
CO2: 22 mmol/L (ref 22–32)
Calcium: 8.3 mg/dL — ABNORMAL LOW (ref 8.9–10.3)
Chloride: 101 mmol/L (ref 98–111)
Creatinine, Ser: 0.91 mg/dL (ref 0.44–1.00)
GFR calc Af Amer: >60 mL/min (ref >60)
GFR calc non Af Amer: >60 mL/min (ref >60)
Glucose, Bld: 81 mg/dL (ref 70–99)
Potassium: 3.8 mmol/L (ref 3.5–5.1)
Sodium: 138 mmol/L (ref 135–145)

## 2019-02-23 LAB — URINE CULTURE: Culture: <10000 — AB

## 2019-02-23 LAB — TROPONIN I: Troponin I: <0.03 ng/mL (ref <0.03)

## 2019-02-23 MED ORDER — FUROSEMIDE 10 MG/ML IJ SOLN: 40.0000 mg | Freq: Two times a day (BID) | INTRAMUSCULAR | Status: DC

## 2019-02-23 MED ORDER — DILTIAZEM HCL 30 MG PO TABS
30.0000 mg | ORAL_TABLET | Freq: Two times a day (BID) | ORAL | Status: DC
  Administered 2019-02-23 – 2019-02-25 (×4): 30 mg via ORAL
  Administered 2019-02-25: 21:00:00 via ORAL
  Administered 2019-02-26 – 2019-03-02 (×9): 30 mg via ORAL
  Filled 2019-02-23 (×14): qty 1

## 2019-02-23 MED ORDER — PREDNISONE 20 MG PO TABS
30.0000 mg | ORAL_TABLET | Freq: Every day | ORAL | Status: DC
  Administered 2019-02-23 – 2019-02-26 (×4): 30 mg via ORAL
  Filled 2019-02-23 (×4): qty 1

## 2019-02-23 MED ORDER — FUROSEMIDE 10 MG/ML IJ SOLN
40.0000 mg | Freq: Two times a day (BID) | INTRAMUSCULAR | Status: DC
  Administered 2019-02-23: 40 mg via INTRAVENOUS
  Filled 2019-02-23: qty 4

## 2019-02-23 MED ORDER — TAMSULOSIN HCL 0.4 MG PO CAPS
0.4000 mg | ORAL_CAPSULE | Freq: Every day | ORAL | Status: DC
  Administered 2019-02-23 – 2019-02-24 (×2): 0.4 mg via ORAL
  Filled 2019-02-23 (×2): qty 1

## 2019-02-23 MED ORDER — FUROSEMIDE 10 MG/ML IJ SOLN
40.0000 mg | Freq: Two times a day (BID) | INTRAMUSCULAR | Status: DC
  Administered 2019-02-24: 08:00:00 40 mg via INTRAVENOUS
  Filled 2019-02-23 (×2): qty 4

## 2019-02-23 NOTE — Progress Notes (Signed)
Patient states she is "in misery," and c/o pain in her lower abdomen, worse with palpation.  States she was catheterized this morning and she feels she may need to be catheterized again d/t inability to void.  No urine output noted from external catheter.  Bladder scan shows 787 mL in bladder.  Bodenheimer, NP notified.  Order received for foley catheter.  Jodell Cipro

## 2019-02-23 NOTE — NC FL2 (Signed)
MEDICAID FL2 LEVEL OF CARE SCREENING TOOL     IDENTIFICATION  Patient Name: Nicole Norton Birthdate: [birth year] Sex: female Admission Date (Current Location): 02/22/2019  [facility] and [place] Number:  Herbalist and Address:  [facility], [place] [address], [place], [place] [address]      Provider Number: [id number]  Attending Physician Name and Address:  Elodia [place]., *  Relative Name and Phone Number:  Avalin Briley, spouse, [phone]    Current Level of Care: Hospital Recommended Level of Care: [place] Prior Approval Number:    Date Approved/Denied:   PASRR Number: [id number]  Discharge Plan: SNF    Current Diagnoses: Patient Active Problem List   Diagnosis Date Noted  . Atrial fibrillation with RVR ([place]) 02/22/2019  . Fall at home, initial encounter 02/22/2019  . CKD (chronic kidney disease) stage 3, GFR 30-59 ml/min (HCC) 02/22/2019  . Chronic respiratory failure with hypoxia ([place]) 02/22/2019  . 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 Diastolic dysfunction 66/44/0347  . Eosinophil count raised 06/12/2018  . Rash 06/12/2018  . Iron deficiency anemia 11/18/2017  . Chronic atrial fibrillation 11/18/2017  . Chronic anticoagulation 11/18/2017  . Obesity   . Hyperlipidemia   . Anxiety   . Allergy   . 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 Nocturnal hypoxemia 05/05/2017  . Acute on chronic respiratory failure with hypoxemia ([place]) 05/05/2017  . Pulmonary nodule 05/05/2017  . Restrictive lung disease 03/10/2017  . Aortic atherosclerosis ([place]) 03/04/2017  . Cardiac arrhythmia 01/02/2016  . Gout 09/16/2015  . Gastroesophageal reflux disease without esophagitis 02/11/2015  . Family history of diabetes mellitus 03/18/2014  . Urinary frequency 03/18/2014  . Depression with anxiety 03/18/2014  . Obesity, Class  II, BMI 35-39.9 12/15/2012  . Family history of breast cancer 11/20/2012  . Depressive disorder, not elsewhere classified 05/20/2011  . DEGENERATIVE JOINT DISEASE 06/24/2010  . Hyperlipidemia LDL goal <160 03/18/2010  . Hypothyroidism 08/21/2009  . OBESITY, UNSPECIFIED 08/21/2009  . Essential hypertension, benign 12/19/2008  . SHOULDER PAIN, LEFT 12/19/2008    Orientation RESPIRATION BLADDER Height & Weight     Self, Place  O2(nasal cannula 2L) Continent, Indwelling catheter Weight: 240 lb 11.9 oz (109.2 kg) Height:  5\' 6"  (167.6 cm)  BEHAVIORAL SYMPTOMS/MOOD NEUROLOGICAL BOWEL NUTRITION STATUS      Incontinent Diet(please see DC summary)  AMBULATORY STATUS COMMUNICATION OF NEEDS Skin   Extensive Assist Verbally Normal                       Personal Care Assistance Level of Assistance  Bathing, Feeding, Dressing Bathing Assistance: Limited assistance Feeding assistance: Independent Dressing Assistance: Limited assistance     Functional Limitations Info  Sight, Hearing, Speech Sight Info: Adequate Hearing Info: Adequate Speech Info: Adequate    SPECIAL CARE FACTORS FREQUENCY  PT (By licensed PT), OT (By licensed OT)     PT Frequency: 5x/week OT Frequency: 5x/week            Contractures Contractures Info: Not present    Additional Factors Info  Code Status, Allergies, Psychotropic Code Status Info: Full Allergies Info: Allopurinol, Uloric Febuxostat, Colchicine, Cymbalta Duloxetine Hcl, Etodolac, Omeprazole, Paroxetine, Penicillins, Solu-medrol Methylprednisolone Acetate, Statins, Lasix Furosemide Psychotropic Info: prozac         Current Medications (02/23/2019):  This is the current hospital active medication list Current Facility-Administered Medications  Medication Dose Route  Frequency Provider Last Rate Last Dose  . 0.9 %  sodium chloride infusion  250 mL Intravenous PRN Dessa Phi, DO      . acetaminophen (TYLENOL) tablet 650 mg  650 mg Oral  Q6H PRN Dessa Phi, DO   650 mg at 02/23/19 0931   Or  . acetaminophen (TYLENOL) suppository 650 mg  650 mg Rectal Q6H PRN Dessa Phi, DO      . amLODipine (NORVASC) tablet 10 mg  10 mg Oral Daily Dessa Phi, DO   10 mg at 02/23/19 0912  . azithromycin (ZITHROMAX) tablet 500 mg  500 mg Oral Daily Dessa Phi, DO   500 mg at 02/23/19 0912  . carvedilol (COREG) tablet 25 mg  25 mg Oral BID WC Dessa Phi, DO   25 mg at 02/23/19 0913  . cefTRIAXone (ROCEPHIN) 1 g in sodium chloride 0.9 % 100 mL IVPB  1 g Intravenous Q24H Dessa Phi, DO 200 mL/hr at 02/23/19 0929 1 g at 02/23/19 0929  . diltiazem (CARDIZEM) 100 mg in dextrose 5% 186mL (1 mg/mL) infusion  5-15 mg/hr Intravenous Continuous Dessa Phi, DO 5 mL/hr at 02/22/19 2321 5 mg/hr at 02/22/19 2321  . flecainide (TAMBOCOR) tablet 100 mg  100 mg Oral BID Dessa Phi, DO   100 mg at 02/23/19 0913  . FLUoxetine (PROZAC) capsule 20 mg  20 mg Oral Daily Dessa Phi, DO   20 mg at 02/23/19 0912  . hydrochlorothiazide (HYDRODIURIL) tablet 25 mg  25 mg Oral Daily Dessa Phi, DO   25 mg at 02/23/19 0913  . irbesartan (AVAPRO) tablet 300 mg  300 mg Oral Daily Dessa Phi, DO   300 mg at 02/23/19 0912  . levothyroxine (SYNTHROID, LEVOTHROID) tablet 225 mcg  225 mcg Oral Daily Dessa Phi, DO   225 mcg at 02/23/19 0617  . ondansetron (ZOFRAN) tablet 4 mg  4 mg Oral Q6H PRN Dessa Phi, DO       Or  . ondansetron [facility]) injection 4 mg  4 mg Intravenous Q6H PRN Dessa Phi, DO   4 mg at 02/23/19 0453  . oxyCODONE (Oxy IR/ROXICODONE) immediate release tablet 10 mg  10 mg Oral QID PRN Dessa Phi, DO   10 mg at 02/23/19 0617  . rivaroxaban (XARELTO) tablet 20 mg  20 mg Oral Q supper Dessa Phi, DO   20 mg at 02/22/19 1728  . sodium chloride flush (NS) 0.9 % injection 3 mL  3 mL Intravenous Q12H Dessa Phi, DO   3 mL at 02/22/19 2300  . sodium chloride flush (NS) 0.9 % injection 3 mL  3 mL Intravenous PRN  Dessa Phi, DO         Discharge Medications: Please see discharge summary for a list of discharge medications.  Relevant Imaging Results:  Relevant Lab Results:   Additional Information SSN: [id number]  Estanislado Emms, LCSW

## 2019-02-23 NOTE — Progress Notes (Signed)
SATURATION QUALIFICATIONS: (This note is used to comply with regulatory documentation for home oxygen)  Patient Saturations on Room Air at Rest = 84%  Patient Saturations on Room Air while Ambulating = NT as pt was 84% on RA  Patient Saturations on 3 Liters of oxygen while Ambulating = 93%  Please briefly explain why patient needs home oxygen:Pt will need home O2 as she desats on RA at rest. Thanks.  [place] Pager:  [phone]  Office:  [phone]

## 2019-02-23 NOTE — TOC Initial Note (Signed)
Transition of Care [facility]) - Initial/Assessment Note    Patient Details  Name: Nicole Norton MRN: [id number] Date of Birth: 1941/04/25  Transition of [facility]) CM/SW Contact:    Estanislado Emms, LCSW Phone Number: 02/23/2019, 11:23 AM  Clinical Narrative: CSW spoke to patient on the phone. Patient alert and mostly oriented, but distracted by IV beeping and stating she's exhausted. CSW introduced self and role and discussed disposition planning - PT recommendation for SNF. Patient is agreeable to SNF. She stated she needs to discuss with her husband about where she will go for rehab. Patient did not want to discuss any further, stating she was tired and wanted to take a nap.    Placed call to patient's husband and left voicemail, awaiting call back. CSW will send out initial SNF referrals and provide bed offers when available. Will follow and support with disposition planning.                  Expected Discharge Plan: Skilled Nursing Facility Barriers to Discharge: Continued Medical Work up   Patient Goals and CMS Choice   CMS Medicare.gov Compare Post Acute Care list provided to:: Patient Choice offered to / list presented to : Patient  Expected Discharge Plan and Services Expected Discharge Plan: [place] Choice: [place] arrangements for the past 2 months: Single Family Home                          Prior Living Arrangements/Services Living arrangements for the past 2 months: Single Family Home Lives with:: Spouse Patient language and need for interpreter reviewed:: Yes Do you feel safe going back to the place where you live?: Yes      Need for Family Participation in Patient Care: Yes (Comment)     Criminal Activity/Legal Involvement Pertinent to Current Situation/Hospitalization: No - Comment as needed  Activities of Daily Living      Permission Sought/Granted Permission sought to share information  with : Facility Sport and exercise psychologist, Family Supports Permission granted to share information with : Yes, Verbal Permission Granted  Share Information with NAME: Nocole Zammit  Permission granted to share info w AGENCY: SNFs  Permission granted to share info w Relationship: spouse  Permission granted to share info w Contact Information: [phone]  Emotional Assessment Appearance:: Appears stated age Attitude/Demeanor/Rapport: Engaged Affect (typically observed): Accepting, Calm, Appropriate Orientation: : Oriented to Self Alcohol / Substance Use: Not Applicable Psych Involvement: No (comment)  Admission diagnosis:  Fall [W19.XXXA] Patient Active Problem List   Diagnosis Date Noted  . Atrial fibrillation with RVR ([place]) 02/22/2019  . Fall at home, initial encounter 02/22/2019  . CKD (chronic kidney disease) stage 3, GFR 30-59 ml/min ([facility]) 02/22/2019  . Chronic respiratory failure with hypoxia ([place]) 02/22/2019  . 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 Diastolic dysfunction 05/08/7627  . Eosinophil count raised 06/12/2018  . Rash 06/12/2018  . Iron deficiency anemia 11/18/2017  . Chronic atrial fibrillation 11/18/2017  . Chronic anticoagulation 11/18/2017  . Obesity   . Hyperlipidemia   . Anxiety   . Allergy   . 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[place]) 31/51/7616  . Nocturnal hypoxemia 05/05/2017  . Acute on chronic respiratory failure with hypoxemia ([place]) 05/05/2017  . Pulmonary nodule 05/05/2017  . Restrictive lung disease 03/10/2017  . Aortic atherosclerosis ([place]) 03/04/2017  . Cardiac arrhythmia 01/02/2016  . Gout 09/16/2015  .  Gastroesophageal reflux disease without esophagitis 02/11/2015  . Family history of diabetes mellitus 03/18/2014  . Urinary frequency 03/18/2014  . Depression with anxiety 03/18/2014  . Obesity, Class II, BMI 35-39.9 12/15/2012  . Family history of breast cancer 11/20/2012  .  Depressive disorder, not elsewhere classified 05/20/2011  . DEGENERATIVE JOINT DISEASE 06/24/2010  . Hyperlipidemia LDL goal <160 03/18/2010  . Hypothyroidism 08/21/2009  . OBESITY, UNSPECIFIED 08/21/2009  . Essential hypertension, benign 12/19/2008  . SHOULDER PAIN, LEFT 12/19/2008   PCP:  Hali Marry, MD Pharmacy:   [place], [place] - [address] [place] [address] Phone: [phone] Fax: [phone]     Social Determinants of Health (SDOH) Interventions    Readmission Risk Interventions No flowsheet data found.

## 2019-02-23 NOTE — Plan of Care (Signed)
  Problem: Pain Managment: Goal: General experience of comfort will improve Outcome: Progressing Note:  Pain controlled with po pain meds.   Problem: Elimination: Goal: Will not experience complications related to urinary retention Outcome: Not Progressing Note:  Foley placed last pm d/t acute urinary retention.

## 2019-02-23 NOTE — Evaluation (Addendum)
Physical Therapy Evaluation Patient Details Name: Nicole Norton MRN: [id number] DOB: [birth year] Today's Date: 02/23/2019   History of Present Illness  Pt is a 78 yo female s/p Afib with RVR and fall at home sliding off bed unable to get up- no injury reported. Pt waited on floor for 24 hours, Pmhx: hypothyroidiism, HTN, depression, anxiety, chronic kidney disease, chronic respiratory failure.  Clinical Impression  Pt admitted with above diagnosis. Pt currently with functional limitations due to the deficits listed below (see PT Problem List). Pt was able to pivot to chair only due to fear of falling and refused to walk further.  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  IF she can't get 24 hour care hiring a sitter, will need short term SNF.  Pt aware. Pt highly distracted and difficult to attend to tasks.   Pt will benefit from skilled PT to increase their independence and safety with mobility to allow discharge to the venue listed below.    SATURATION QUALIFICATIONS: (This note is used to comply with regulatory documentation for home oxygen)  Patient Saturations on Room Air at Rest = 84%  Patient Saturations on Room Air while Ambulating = NT as pt was 84% on RA  Patient Saturations on 3 Liters of oxygen while Ambulating = 93%  Please briefly explain why patient needs home oxygen:Pt will need home O2 as she desats on RA at rest.  Follow Up Recommendations SNF;Supervision/Assistance - 24 hour(IF they can hire 24 hour care, HH PT and HHOT, [facility])    Equipment Recommendations  None recommended by PT    Recommendations for Other Services       Precautions / Restrictions Precautions Precautions: Fall;Other (comment) Precaution Comments: 3L O2 and watch HR Restrictions Weight Bearing Restrictions: No      Mobility  Bed Mobility Overal bed mobility: Needs Assistance Bed Mobility: Supine to Sit     Supine to sit: Min guard     General bed mobility  comments: Did not assist to get OOB.  Needs cues  Transfers Overall transfer level: Needs assistance Equipment used: Rolling walker (2 wheeled) Transfers: Sit to/from [facility] Sit to Stand: Min guard;+2 safety/equipment Stand pivot transfers: Min guard;+2 safety/equipment;Min assist       General transfer comment: Pt did not need any assist to come to standing but did need cues for hand placement and decr safety awareness due to inability to focus on tasks.  Easily distracted and internally distracted due to having bladder issues yesterday.  Needed cues to step around to chair and incr time to get pt to sit down in chair.    Ambulation/Gait         Gait velocity: Pt refused to try as she states, "I am weak."  Constantly asking to get back in bed and encouraged regarding the beneifts on being in chair.       Stairs            Wheelchair Mobility    Modified Rankin (Stroke Patients Only)       Balance Overall balance assessment: Needs assistance Sitting-balance support: No upper extremity supported;Feet supported Sitting balance-Leahy Scale: Fair     Standing balance support: Bilateral upper extremity supported;During functional activity Standing balance-Leahy Scale: Poor Standing balance comment: relies on hand support                             Pertinent  Vitals/Pain Pain Assessment: Faces Faces Pain Scale: Hurts even more Pain Location: back and R arm for BP cuff Pain Descriptors / Indicators: Constant;Discomfort Pain Intervention(s): Limited activity within patient's tolerance;Monitored during session;Repositioned    Home Living Family/patient expects to be discharged to:: Private residence Living Arrangements: Spouse/significant other Available Help at Discharge: Other (Comment)(pt was spouses's caregiver mostly for IADL) Type of Home: House Home Access: Stairs to enter   [employer] of Steps: 1 Home Layout:  One level Home Equipment: Walker - 2 wheels;Shower seat;Grab bars - tub/shower      Prior Function Level of Independence: Independent with assistive device(s)               Hand Dominance   Dominant Hand: Right    Extremity/Trunk Assessment   Upper Extremity Assessment Upper Extremity Assessment: Defer to OT evaluation    Lower Extremity Assessment Lower Extremity Assessment: Generalized weakness    Cervical / Trunk Assessment Cervical / Trunk Assessment: Other exceptions(large body habitus)  Communication   Communication: No difficulties  Cognition Arousal/Alertness: Awake/alert Behavior During Therapy: WFL for tasks assessed/performed;Anxious;Agitated Overall Cognitive Status: Impaired/Different from baseline(perseverating and slow to process; could be agitation) Area of Impairment: Safety/judgement;Awareness                         Safety/Judgement: Decreased awareness of deficits     General Comments: Pt concerned with spouse and his ability to care for self as pt is his CG at home. Pt uneasy about going to SNF with poor judgment related to awareness of deficits. Pt expecting to improve and would benefit from in home aid with [facility] or SNF.      General Comments      Exercises General Exercises - Lower Extremity Long Arc Quad: AROM;Both;10 reps;Seated   Assessment/Plan    PT Assessment Patient needs continued PT services  PT Problem List Decreased activity tolerance;Decreased balance;Decreased mobility;Decreased knowledge of use of DME;Decreased safety awareness;Decreased knowledge of precautions       PT Treatment Interventions DME instruction;Gait training;Functional mobility training;Therapeutic activities;Therapeutic exercise;Balance training;Patient/family education;Stair training    PT Goals (Current goals can be found in the Care Plan section)  Acute Rehab PT Goals Patient Stated Goal: to gohome PT Goal Formulation: With patient Time For  Goal Achievement: 03/09/19 Potential to Achieve Goals: Good    Frequency Min 3X/week   Barriers to discharge Decreased caregiver support(pt was caregiver for husband)      Co-evaluation               AM-PAC PT "6 Clicks" Mobility  Outcome Measure Help needed turning from your back to your side while in a flat bed without using bedrails?: None Help needed moving from lying on your back to sitting on the side of a flat bed without using bedrails?: None Help needed moving to and from a bed to a chair (including a wheelchair)?: A Little Help needed standing up from a chair using your arms (e.g., wheelchair or bedside chair)?: A Little Help needed to walk in hospital room?: A Lot Help needed climbing 3-5 steps with a railing? : A Lot 6 Click Score: 18    End of Session Equipment Utilized During Treatment: Gait belt;Oxygen Activity Tolerance: Patient limited by fatigue Patient left: in chair;with call bell/phone within reach;with chair alarm set Nurse Communication: Mobility status PT Visit Diagnosis: Unsteadiness on feet (R26.81);Muscle weakness (generalized) (M62.81)    Time: 1610-9604 PT Time Calculation (min) (ACUTE ONLY): 36  min   Charges:   PT Evaluation $PT Eval Moderate Complexity: [place] Pager:  [phone]  Office:  [place] 02/23/2019, 9:06 AM

## 2019-02-23 NOTE — Discharge Instructions (Signed)

## 2019-02-23 NOTE — Progress Notes (Addendum)
PROGRESS NOTE    Nicole Norton  QMV:[id number] DOB: May 10, 1941 DOA: 02/22/2019 PCP: Nicole Marry, MD   Brief Narrative: Per HPI  Nicole Norton is Nicole Norton 78 y.o. female with medical history significant of chronic atrial fibrillation on Xarelto, hypertension, hyperlipidemia, hypothyroidism who presents after Nicole Norton fall at home. She states that yesterday she slid off her bed, did not injure herself but could not get off the floor.  She decided to rest and see if she could get up eventually but she could not.  She denies any recent illnesses, no fevers or chills, no shortness of breath or cough, no chest pain, no nausea, vomiting, diarrhea or abdominal pain.  She does have chronic weakness.  She wears 1.5L nasal cannula O2 at nighttime.  She did miss doses of her medications yesterday.  ED Course: Patient was found to be in atrial fibrillation with RVR rate in the 130s.  She was given IV metoprolol without adequate control.  She was started on IV Cardizem drip.  Labs revealed creatinine 1.16, BNP 543, troponin 0 0.05 UA negative.  CT head and cervical spine without acute injury.  Pelvic x-ray without acute fracture.  Chest x-ray without acute cardiopulmonary disease.  Patient did have Nicole Norton desaturation down to 89% on room air.  CT head and cervical spine reveal airspace disease in the right upper lobe concerning for possible pneumonia.  She was started on azithromycin and Rocephin.  Assessment & Plan:   Principal Problem:   Atrial fibrillation with RVR ([place]) Active Problems:   Hypothyroidism   Essential hypertension, benign   Depression with anxiety   Fall at home, initial encounter   CKD (chronic kidney disease) stage 3, GFR 30-59 ml/min (HCC)   Chronic respiratory failure with hypoxia (HCC)  Atrial fibrillation RVR -Continue flecainide, Xarelto - Rate improved at this time to the 70's - She's on coreg, will see if she can maintain rate control with just beta blocker and stop  diltiazem  HFpEF Exacerbation - EF normal 10/2018 with grade 2 diastolic dysfunction - She appears overloaded on exam with bilateral LE edema, also with elevated BNP - She says she's on 20 mg lasix daily at home (listed allergy - rash, which she denies), but this wasn't on her med list - Will d/c thiazide diuretic - Start IV lasix BID 40 mg - I/O, daily weights  Elevated troponin: mild, no si/sx concerning for ACS.  Likely 2/2 above.   Chronic hypoxemic respiratory failure with community-acquired pneumonia - Patient found to be saturating 89% on room air, she was placed on 2 L nasal cannula O2.  Chest x-ray was negative for acute cardiopulmonary disease, but CT head and cervical spine did reveal some airspace disease in right upper lobe concerning for possible pneumonia.  It does appear that patient has baseline nasal cannula O2 use of 1.5 L at home qhs - Started on Rocephin and azithromycin x 5 days  - Follow blood cx.  Ucx insignificant growth. 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:  Follows with [facility] She notes she's currently on 30 mg prednsione daily  Thrombocytopenia: continue to monitor  Urinary Retention: pt with  foley in place, start flomax, consider TOV tomorrow  DVT prophylaxis: xarelto Code Status: full  Family Communication: none at bedside - called and left message with husband Disposition Plan: pending improvement in resp status afib and placement   Consultants:   none  Procedures:   none  Antimicrobials:  Anti-infectives (From admission, onward)   Start      Dose/Rate Route Frequency Ordered Stop   02/23/19 1000  cefTRIAXone (ROCEPHIN) 1 g in sodium chloride 0.9 % 100 mL IVPB     1 g 200 mL/hr over 30 Minutes Intravenous Every 24 hours 02/22/19 1433 02/27/19 0959   02/23/19 1000  azithromycin (ZITHROMAX) tablet 500 mg     500 mg Oral Daily 02/22/19 1433 02/27/19 0959   02/22/19 1145  cefTRIAXone (ROCEPHIN) 1 g in sodium chloride 0.9 % 100 mL IVPB     1 g 200 mL/hr over 30 Minutes Intravenous  Once 02/22/19 1139 02/22/19 1339   02/22/19 1145  azithromycin (ZITHROMAX) 500 mg in sodium chloride 0.9 % 250 mL IVPB     500 mg 250 mL/hr over 60 Minutes Intravenous  Once 02/22/19 1139 02/22/19 1424     Subjective: Feels ok. C/o chronic back pain.  Objective: Vitals:   02/23/19 0022 02/23/19 0439 02/23/19 0912 02/23/19 1100  BP: 125/83 (!) 128/94 131/83   Pulse: 99 (!) 116  79  Resp: (!) 23 (!) 22  18  Temp: 98.4 F (36.9 C) 99 F (37.2 C)    TempSrc: Oral Oral    SpO2: 96% 99%  93%  Weight:  109.2 kg    Height:        Intake/Output Summary (Last 24 hours) at 02/23/2019 1324 Last data filed at 02/23/2019 1000 Gross per 24 hour  Intake 750.91 ml  Output 750 ml  Net 0.91 ml   Filed Weights   02/22/19 0840 02/22/19 1427 02/23/19 0439  Weight: 111.1 kg 110.3 kg 109.2 kg    Examination:  General exam: Appears calm and comfortable  Respiratory system: Clear to auscultation. Respiratory effort normal. Cardiovascular system: irregularly irregular Gastrointestinal system: Abdomen is nondistended, soft and nontender. Central nervous system: Alert and oriented. No focal neurological deficits. Extremities: bilateral LE edema Skin: No rashes, lesions or ulcers Psychiatry: Judgement and insight appear normal. Mood & affect appropriate.     Data Reviewed: I have personally reviewed following labs and imaging studies  CBC: Recent Labs  Lab 02/22/19 1015 02/23/19 0313  WBC 10.3 8.3  NEUTROABS 8.1*  --   HGB 12.6 12.1  HCT 39.1  36.1  MCV 106.8* 102.8*  PLT 160 694*   Basic Metabolic Panel: Recent Labs  Lab 02/22/19 1015 02/23/19 0313  NA 143 138  K 3.6 3.8  CL 100 101  CO2 31 22  GLUCOSE 77 81  BUN 34* 18  CREATININE 1.16* 0.91  CALCIUM 8.9 8.3*   GFR: Estimated Creatinine Clearance: 66.9 mL/min (by C-G formula based on SCr of 0.91 mg/dL). Liver Function Tests: Recent Labs  Lab 02/22/19 1015  AST 26  ALT 26  ALKPHOS 52  BILITOT 1.1  PROT 5.4*  ALBUMIN 3.1*   No results for input(s): LIPASE, AMYLASE in the last 168 hours. No results for input(s): AMMONIA in the last 168 hours. Coagulation Profile: Recent Labs  Lab 02/22/19 1015  INR 1.1   Cardiac Enzymes: Recent Labs  Lab 02/22/19 1015 02/22/19 1459 02/22/19 2220 02/23/19 0313  CKTOTAL 73  --   --   --   Conception Chancy  0.05* 0.04* 0.03* <0.03   BNP (last 3 results) No results for input(s): PROBNP in the last 8760 hours. HbA1C: No results for input(s): HGBA1C in the last 72 hours. CBG: No results for input(s): GLUCAP in the last 168 hours. Lipid Profile: No results for input(s): CHOL, HDL, LDLCALC, TRIG, CHOLHDL, LDLDIRECT in the last 72 hours. Thyroid Function Tests: No results for input(s): TSH, T4TOTAL, FREET4, T3FREE, THYROIDAB in the last 72 hours. Anemia Panel: No results for input(s): VITAMINB12, FOLATE, FERRITIN, TIBC, IRON, RETICCTPCT in the last 72 hours. Sepsis Labs: Recent Labs  Lab 02/22/19 1015 02/22/19 1459  LATICACIDVEN 1.1 1.3    Recent Results (from the past 240 hour(s))  Culture, blood (routine x 2)     Status: None (Preliminary result)   Collection Time: 02/22/19 10:10 AM  Result Value Ref Range Status   Specimen Description BLOOD RIGHT ANTECUBITAL  Final   Special Requests   Final    BOTTLES DRAWN AEROBIC AND ANAEROBIC Blood Culture results may not be optimal due to an excessive volume of blood received in culture bottles   Culture   Final    NO GROWTH 1 DAY Performed at [facility] [address], [place], [place] [address]    Report Status PENDING  Incomplete  Urine culture     Status: Abnormal   Collection Time: 02/22/19 10:30 AM  Result Value Ref Range Status   Specimen Description URINE, RANDOM  Final   Special Requests NONE  Final   Culture (Kaelem Brach)  Final    <10,000 COLONIES/mL INSIGNIFICANT GROWTH Performed at [facility] [address] [place], [place] [address]    Report Status 02/23/2019 FINAL  Final  Culture, blood (routine x 2)     Status: None (Preliminary result)   Collection Time: 02/22/19 10:35 AM  Result Value Ref Range Status   Specimen Description BLOOD LEFT ANTECUBITAL  Final   Special Requests   Final    BOTTLES DRAWN AEROBIC AND ANAEROBIC Blood Culture adequate volume   Culture   Final    NO GROWTH 1 DAY Performed at [facility], [place] [address] [place], [place] [address]    Report Status PENDING  Incomplete         Radiology Studies: Dg Chest 1 View  Result Date: 02/22/2019 CLINICAL DATA:  Patient fell and was an able to get up. Sore all over. EXAM: CHEST  1 VIEW COMPARISON:  09/29/2018 FINDINGS: Cardiac silhouette is borderline enlarged. No mediastinal or hilar masses or evidence of adenopathy. There are prominent bronchovascular markings accentuated by relatively low lung volumes. No evidence of pneumonia or pulmonary edema. Elevated right hemidiaphragm is stable. No convincing pleural effusion and no pneumothorax. Skeletal structures are demineralized but grossly intact. IMPRESSION: No acute cardiopulmonary disease. Electronically Signed   By: Lajean Manes M.D.   On: 02/22/2019 10:54   Dg Pelvis 1-2 Views  Result Date: 02/22/2019 CLINICAL DATA:  Patient fell and was unable to get up. Sore all over. EXAM: PELVIS - 1-2 VIEW COMPARISON:  None. FINDINGS: No fracture or bone lesion. Hip joints, SI joints and symphysis pubis are normally spaced and aligned. No significant arthropathic change. Skeletal structures are demineralized.  Soft tissues are unremarkable. IMPRESSION: No fracture, joint abnormality or acute finding. Electronically Signed   By: Lajean Manes M.D.   On: 02/22/2019 10:53   Ct Head Wo Contrast  Result Date: 02/22/2019 CLINICAL DATA:  Fall. EXAM: CT HEAD WITHOUT CONTRAST CT CERVICAL  SPINE WITHOUT CONTRAST TECHNIQUE: Multidetector CT imaging of the head and cervical spine was performed following the standard protocol without intravenous contrast. Multiplanar CT image reconstructions of the cervical spine were also generated. COMPARISON:  None. FINDINGS: CT HEAD FINDINGS Brain: Mild age related volume loss. No acute intracranial abnormality. Specifically, no hemorrhage, hydrocephalus, mass lesion, acute infarction, or significant intracranial injury. Vascular: No hyperdense vessel or unexpected calcification. Skull: No acute calvarial abnormality. Sinuses/Orbits: Visualized paranasal sinuses and mastoids clear. Orbital soft tissues unremarkable. Other: None CT CERVICAL SPINE FINDINGS Alignment: No subluxation Skull base and vertebrae: No acute fracture. No primary bone lesion or focal pathologic process. Soft tissues and spinal canal: No prevertebral fluid or swelling. No visible canal hematoma. Disc levels: Disc space narrowing and spurring at C4-5 through C6-7. Upper chest: Airspace disease in the right upper lobe concerning for possible pneumonia. Other: None IMPRESSION: No acute intracranial abnormality. No acute bony abnormality in the cervical spine. Mild degenerative disc disease. Airspace disease in the right upper lobe concerning for possible pneumonia. Electronically Signed   By: Rolm Baptise M.D.   On: 02/22/2019 11:06   Ct Cervical Spine Wo Contrast  Result Date: 02/22/2019 CLINICAL DATA:  Fall. EXAM: CT HEAD WITHOUT CONTRAST CT CERVICAL SPINE WITHOUT CONTRAST TECHNIQUE: Multidetector CT imaging of the head and cervical spine was performed following the standard protocol without intravenous contrast.  Multiplanar CT image reconstructions of the cervical spine were also generated. COMPARISON:  None. FINDINGS: CT HEAD FINDINGS Brain: Mild age related volume loss. No acute intracranial abnormality. Specifically, no hemorrhage, hydrocephalus, mass lesion, acute infarction, or significant intracranial injury. Vascular: No hyperdense vessel or unexpected calcification. Skull: No acute calvarial abnormality. Sinuses/Orbits: Visualized paranasal sinuses and mastoids clear. Orbital soft tissues unremarkable. Other: None CT CERVICAL SPINE FINDINGS Alignment: No subluxation Skull base and vertebrae: No acute fracture. No primary bone lesion or focal pathologic process. Soft tissues and spinal canal: No prevertebral fluid or swelling. No visible canal hematoma. Disc levels: Disc space narrowing and spurring at C4-5 through C6-7. Upper chest: Airspace disease in the right upper lobe concerning for possible pneumonia. Other: None IMPRESSION: No acute intracranial abnormality. No acute bony abnormality in the cervical spine. Mild degenerative disc disease. Airspace disease in the right upper lobe concerning for possible pneumonia. Electronically Signed   By: Rolm Baptise M.D.   On: 02/22/2019 11:06        Scheduled Meds:  amLODipine  10 mg Oral Daily   azithromycin  500 mg Oral Daily   carvedilol  25 mg Oral BID WC   flecainide  100 mg Oral BID   FLUoxetine  20 mg Oral Daily   furosemide  40 mg Intravenous BID   irbesartan  300 mg Oral Daily   levothyroxine  225 mcg Oral Daily   rivaroxaban  20 mg Oral Q supper   sodium chloride flush  3 mL Intravenous Q12H   Continuous Infusions:  sodium chloride     cefTRIAXone (ROCEPHIN)  IV Stopped (02/23/19 0939)     LOS: 1 day    Time spent: over 30 min    Fayrene Helper, MD [facility] Pager AMION  If 7PM-7AM, please contact night-coverage [email or web address] Password [email or web address] 02/23/2019, 1:24 PM

## 2019-02-23 NOTE — Evaluation (Signed)
Occupational Therapy Evaluation Patient Details Name: Nicole Norton MRN: [id number] DOB: [birth year] Today's Date: 02/23/2019    History of Present Illness Pt is a 78 yo female s/p Afib with RVR and fall at home sliding off bed unable to get up- no injury reported. Pt waited on floor for 24 hours, Pmhx: hypothyroidiism, HTN, depression, anxiety, chronic kidney disease, chronic respiratory failure.   Clinical Impression   Pt PTA: living with spouse- spouse's caregiver for the past 10 years so limited support at home, but pt mentioned she may have the means to hire assist. Pt currently limited by ADL dependence on caregivers, decreased strength and decreased mobility with poor activity tolerance. Pt limited by o2 sats desatting into 80s so pt requires 2L at this time post exertion. Pt transfers with minguardA +2 for lines and verbal cueing. Pt would greatly benefit from continued OT skilled services for ADL, mobility and safety in SNF setting. OT to follow acutely.    Follow Up Recommendations  SNF;Supervision - Intermittent    Equipment Recommendations  3 in 1 bedside commode(bariatric)    Recommendations for Other Services       Precautions / Restrictions Precautions Precautions: Fall;Other (comment) Precaution Comments: 3L O2 and watch HR Restrictions Weight Bearing Restrictions: No      Mobility Bed Mobility Overal bed mobility: Needs Assistance Bed Mobility: Supine to Sit     Supine to sit: Min guard     General bed mobility comments: Did not assist to get OOB.  Needs cues  Transfers Overall transfer level: Needs assistance Equipment used: Rolling walker (2 wheeled) Transfers: Sit to/from [facility] Sit to Stand: Min guard;+2 safety/equipment Stand pivot transfers: Min guard;+2 safety/equipment;Min assist       General transfer comment: Pt did not need any assist to come to standing but did need cues for hand placement and decr safety  awareness due to inability to focus on tasks.  Easily distracted and internally distracted due to having bladder issues yesterday.  Needed cues to step around to chair and incr time to get pt to sit down in chair.      Balance Overall balance assessment: Needs assistance Sitting-balance support: No upper extremity supported;Feet supported Sitting balance-Leahy Scale: Fair     Standing balance support: Bilateral upper extremity supported;During functional activity Standing balance-Leahy Scale: Poor Standing balance comment: relies on hand support                           ADL either performed or assessed with clinical judgement   ADL Overall ADL's : Needs assistance/impaired Eating/Feeding: Modified independent;Sitting   Grooming: Set up;Sitting   Upper Body Bathing: Minimal assistance;Sitting   Lower Body Bathing: Moderate assistance;Sitting/lateral leans;Sit to/from stand   Upper Body Dressing : Minimal assistance;Sitting   Lower Body Dressing: Moderate assistance;Sitting/lateral leans;Sit to/from stand   Toilet Transfer: Min guard;+2 for physical assistance;+2 for safety/equipment   Toileting- Water quality scientist and Hygiene: Moderate assistance;Sitting/lateral lean;Sit to/from stand       Functional mobility during ADLs: Min guard;+2 for physical assistance;+2 for safety/equipment;Rolling walker General ADL Comments: pt very against gonig to SNF, but unable to stand and mobilize well without assist. Pt requiring assist with all ADL as she is very weak.     Vision Baseline Vision/History: No visual deficits Vision Assessment?: No apparent visual deficits     Perception     Praxis      Pertinent Vitals/Pain Pain Assessment: Faces Faces  Pain Scale: Hurts even more Pain Location: back and R arm for BP cuff Pain Descriptors / Indicators: Constant;Discomfort     Hand Dominance Right   Extremity/Trunk Assessment Upper Extremity Assessment Upper  Extremity Assessment: Generalized weakness   Lower Extremity Assessment Lower Extremity Assessment: Defer to PT evaluation   Cervical / Trunk Assessment Cervical / Trunk Assessment: Other exceptions(large body habitus)   Communication Communication Communication: No difficulties   Cognition Arousal/Alertness: Awake/alert Behavior During Therapy: WFL for tasks assessed/performed;Anxious;Agitated Overall Cognitive Status: Impaired/Different from baseline(perseverating and slow to process; could be agitation) Area of Impairment: Safety/judgement;Awareness                         Safety/Judgement: Decreased awareness of deficits     General Comments: Pt concerned with spouse and his ability to care for self as pt is his CG at home. Pt uneasy about going to SNF with poor judgment related to awareness of deficits. Pt expecting to improve and would benefit from in home aid with [facility] or SNF.   General Comments       Exercises     Shoulder Instructions      Home Living Family/patient expects to be discharged to:: Private residence Living Arrangements: Spouse/significant other Available Help at Discharge: Other (Comment)(pt was spouses's caregiver mostly for IADL) Type of Home: House Home Access: Stairs to enter [employer] of Steps: 1   Home Layout: One level     Bathroom Shower/Tub: Teacher, early years/pre: Handicapped height     Home Equipment: Environmental consultant - 2 wheels;Shower seat;Grab bars - tub/shower          Prior Functioning/Environment Level of Independence: Independent with assistive device(s)                 OT Problem List: Decreased strength;Decreased activity tolerance;Impaired balance (sitting and/or standing);Decreased safety awareness;Pain      OT Treatment/Interventions: Self-care/ADL training;Therapeutic exercise;Energy conservation;Neuromuscular education;Therapeutic activities;Patient/family education;Balance training     OT Goals(Current goals can be found in the care plan section) Acute Rehab OT Goals Patient Stated Goal: to gohome OT Goal Formulation: With patient Time For Goal Achievement: 03/09/19 Potential to Achieve Goals: Good ADL Goals Pt Will Perform Upper Body Bathing: with modified independence;sitting Pt Will Perform Lower Body Dressing: with modified independence;sit to/from stand Pt Will Transfer to Toilet: with modified independence Pt Will Perform Toileting - Clothing Manipulation and hygiene: with modified independence Pt/caregiver will Perform Home Exercise Program: Both right and left upper extremity;Independently;With written HEP provided Additional ADL Goal #1: Pt will state 3 energy conservation strategies to implement at home.  OT Frequency: Min 3X/week   Barriers to D/C: Decreased caregiver support          Co-evaluation              AM-PAC OT "6 Clicks" Daily Activity     Outcome Measure Help from another person eating meals?: None Help from another person taking care of personal grooming?: A Little Help from another person toileting, which includes using toliet, bedpan, or urinal?: A Lot Help from another person bathing (including washing, rinsing, drying)?: A Lot Help from another person to put on and taking off regular upper body clothing?: A Little Help from another person to put on and taking off regular lower body clothing?: A Lot 6 Click Score: 16   End of Session Equipment Utilized During Treatment: Gait belt;Rolling walker Nurse Communication: Mobility status  Activity Tolerance: Patient limited  by fatigue;Patient limited by lethargy Patient left: in chair;with call bell/phone within reach;with chair alarm set  OT Visit Diagnosis: Unsteadiness on feet (R26.81);Muscle weakness (generalized) (M62.81);Pain Pain - part of body: (back)                Time: 1410-3013 OT Time Calculation (min): 38 min Charges:  OT General Charges $OT Visit: 1 Visit OT  Evaluation $OT Eval Moderate Complexity: 1 Mod  Darryl Nestle) Marsa Aris OTR/L Acute Rehabilitation Services Pager: [phone] Office: [place] 02/23/2019, 5:19 PM

## 2019-02-24 ENCOUNTER — Inpatient Hospital Stay (HOSPITAL_COMMUNITY): Payer: Medicare Other

## 2019-02-24 LAB — COMPREHENSIVE METABOLIC PANEL
ALT: 27 U/L (ref 0–44)
AST: 20 U/L (ref 15–41)
Albumin: 2.6 g/dL — ABNORMAL LOW (ref 3.5–5.0)
Alkaline Phosphatase: 49 U/L (ref 38–126)
Anion gap: 17 — ABNORMAL HIGH (ref 5–15)
BUN: 19 mg/dL (ref 8–23)
CO2: 25 mmol/L (ref 22–32)
Calcium: 8.6 mg/dL — ABNORMAL LOW (ref 8.9–10.3)
Chloride: 97 mmol/L — ABNORMAL LOW (ref 98–111)
Creatinine, Ser: 1.2 mg/dL — ABNORMAL HIGH (ref 0.44–1.00)
GFR calc Af Amer: 51 mL/min — ABNORMAL LOW (ref >60)
GFR calc non Af Amer: 44 mL/min — ABNORMAL LOW (ref >60)
Glucose, Bld: 108 mg/dL — ABNORMAL HIGH (ref 70–99)
Potassium: 4.2 mmol/L (ref 3.5–5.1)
Sodium: 139 mmol/L (ref 135–145)
Total Bilirubin: 1 mg/dL (ref 0.3–1.2)
Total Protein: 5.1 g/dL — ABNORMAL LOW (ref 6.5–8.1)

## 2019-02-24 LAB — CBC
HCT: 35.3 % — ABNORMAL LOW (ref 36.0–46.0)
Hemoglobin: 12.4 g/dL (ref 12.0–15.0)
MCH: 35.7 pg — ABNORMAL HIGH (ref 26.0–34.0)
MCHC: 35.1 g/dL (ref 30.0–36.0)
MCV: 101.7 fL — ABNORMAL HIGH (ref 80.0–100.0)
Platelets: 135 10*3/uL — ABNORMAL LOW (ref 150–400)
RBC: 3.47 MIL/uL — ABNORMAL LOW (ref 3.87–5.11)
RDW: 13.5 % (ref 11.5–15.5)
WBC: 7.1 10*3/uL (ref 4.0–10.5)
nRBC: 0 % (ref 0.0–0.2)

## 2019-02-24 MED ORDER — SODIUM CHLORIDE 0.9% FLUSH
10.0000 mL | Freq: Two times a day (BID) | INTRAVENOUS | Status: DC
  Administered 2019-02-24 – 2019-02-28 (×8): 10 mL

## 2019-02-24 MED ORDER — SODIUM CHLORIDE 0.9% FLUSH
10.0000 mL | INTRAVENOUS | Status: DC | PRN
  Administered 2019-02-25: 22:00:00 10 mL
  Filled 2019-02-24: qty 40

## 2019-02-24 MED ORDER — OXYCODONE HCL 5 MG PO TABS
10.0000 mg | ORAL_TABLET | Freq: Four times a day (QID) | ORAL | Status: DC
  Administered 2019-02-24 – 2019-03-02 (×24): 10 mg via ORAL
  Filled 2019-02-24 (×25): qty 2

## 2019-02-24 NOTE — Progress Notes (Addendum)
Physical Therapy Treatment Patient Details Name: Nicole Norton MRN: [id number] DOB: 23-Nov-1940 Today's Date: 02/24/2019    History of Present Illness Pt is a 78 yo female s/p Afib with RVR and fall at home sliding off bed unable to get up- no injury reported. Pt waited on floor for 24 hours, On 02/24/19 pt now COVID R/O. Pmhx: hypothyroidiism, HTN, depression, anxiety, chronic kidney disease, chronic respiratory failure.    PT Comments    Pt making slow progress. Pt fearful of falling and wants to progress very slowly with advancement of mobility.    Follow Up Recommendations  SNF;Other (comment)(unless pt hires assist for home and then [place])     Equipment Recommendations  None recommended by PT    Recommendations for Other Services       Precautions / Restrictions Precautions Precautions: Fall;Other (comment) Precaution Comments: 3L O2 and watch HR Restrictions Weight Bearing Restrictions: No    Mobility  Bed Mobility Overal bed mobility: Needs Assistance Bed Mobility: Supine to Sit;Sit to Supine     Supine to sit: Min guard;HOB elevated Sit to supine: Min assist   General bed mobility comments: Incr time. Assist to bring legs back up into bed.  Transfers Overall transfer level: Needs assistance Equipment used: Rolling walker (2 wheeled) Transfers: Sit to/from Stand Sit to Stand: Min guard         General transfer comment: Verbal cues for hand placement  Ambulation/Gait             General Gait Details: Performed marching in place at bedside with walker with min guard. Pt refused to amb away from bed.   Stairs             Wheelchair Mobility    Modified Rankin (Stroke Patients Only)       Balance Overall balance assessment: Needs assistance Sitting-balance support: No upper extremity supported;Feet supported Sitting balance-Leahy Scale: Fair     Standing balance support: Bilateral upper extremity supported;During functional  activity Standing balance-Leahy Scale: Poor Standing balance comment: walker and min guard for static standing                            Cognition Arousal/Alertness: Awake/alert Behavior During Therapy: WFL for tasks assessed/performed;Anxious;Agitated Overall Cognitive Status: Within Functional Limits for tasks assessed                                 General Comments: Pt very particular about how session proceeds      Exercises      General Comments        Pertinent Vitals/Pain Pain Assessment: Faces Faces Pain Scale: Hurts little more Pain Location: back Pain Descriptors / Indicators: Discomfort;Guarding Pain Intervention(s): Limited activity within patient's tolerance;Repositioned    Home Living                      Prior Function            PT Goals (current goals can now be found in the care plan section) Progress towards PT goals: Progressing toward goals    Frequency    Min 3X/week      PT Plan Current plan remains appropriate    Co-evaluation              AM-PAC PT "6 Clicks" Mobility   Outcome Measure  Help needed turning from your  back to your side while in a flat bed without using bedrails?: None Help needed moving from lying on your back to sitting on the side of a flat bed without using bedrails?: A Little Help needed moving to and from a bed to a chair (including a wheelchair)?: A Little Help needed standing up from a chair using your arms (e.g., wheelchair or bedside chair)?: A Little Help needed to walk in hospital room?: A Lot Help needed climbing 3-5 steps with a railing? : Total 6 Click Score: 16    End of Session Equipment Utilized During Treatment: Oxygen Activity Tolerance: Patient limited by fatigue Patient left: in bed;with call bell/phone within reach;with bed alarm set Nurse Communication: Mobility status PT Visit Diagnosis: Unsteadiness on feet (R26.81);Muscle weakness (generalized)  (M62.81)     Time: 0160-1093 PT Time Calculation (min) (ACUTE ONLY): 29 min  Charges:  $Therapeutic Activity: 23-37 mins                     Ava Pager [phone] Office [place] 02/24/2019, 4:26 PM

## 2019-02-24 NOTE — Progress Notes (Addendum)
PROGRESS NOTE    Stephane Junkins  XBJ:[id number] DOB: [birth year] DOA: 02/22/2019 PCP: Hali Marry, MD   Brief Narrative: Per HPI  Nicole Norton is Nicole Norton 78 y.o. female with medical history significant of chronic atrial fibrillation on Xarelto, hypertension, hyperlipidemia, hypothyroidism who presents after [name] fall at home. She states that yesterday she slid off her bed, did not injure herself but could not get off the floor.  She decided to rest and see if she could get up eventually but she could not.  She denies any recent illnesses, no fevers or chills, no shortness of breath or cough, no chest pain, no nausea, vomiting, diarrhea or abdominal pain.  She does have chronic weakness.  She wears 1.5L nasal cannula O2 at nighttime.  She did miss doses of her medications yesterday.  ED Course: Patient was found to be in atrial fibrillation with RVR rate in the 130s.  She was given IV metoprolol without adequate control.  She was started on IV Cardizem drip.  Labs revealed creatinine 1.16, BNP 543, troponin 0 0.05 UA negative.  CT head and cervical spine without acute injury.  Pelvic x-ray without acute fracture.  Chest x-ray without acute cardiopulmonary disease.  Patient did have Jeshua Ransford desaturation down to 89% on room air.  CT head and cervical spine reveal airspace disease in the right upper lobe concerning for possible pneumonia.  She was started on azithromycin and Rocephin.  Assessment & Plan:   Principal Problem:   Atrial fibrillation with RVR (HCC) Active Problems:   Hypothyroidism   Essential hypertension, benign   Depression with anxiety   Fall at home, initial encounter   CKD (chronic kidney disease) stage 3, GFR 30-59 ml/min (HCC)   Chronic respiratory failure with hypoxia (HCC)  Acute on Chronic hypoxemic respiratory failure with community-acquired pneumonia - Patient found to be saturating 89% on room air, she was placed on 2 L nasal cannula O2.  Chest x-ray was  negative for acute cardiopulmonary disease, but CT head and cervical spine did reveal some airspace disease in right upper lobe concerning for possible pneumonia.  It does appear that patient has baseline nasal cannula O2 use of 1.5 L at home qhs - Repeat CXR 4/11 with patchy bilateral upper lung opacities - Started on Rocephin and azithromycin x 5 days  - Follow blood cx.  Ucx insignificant growth. - She's afebrile, but given presence of community spread of COVID 19 and her bilateral findings on CXR will send COVID testing. Pt without hx of travel or sick contact and story does not seem typical, but will rule out.   - On droplet and contact precautions now  Atrial fibrillation RVR -Continue flecainide, Xarelto -Continue coreg 25 mg BID -Diltiazem 30 mg BID has been added as well  HFpEF Exacerbation - EF normal 10/2018 with grade 2 diastolic dysfunction - She appears overloaded on exam with bilateral LE edema (persistent), also with elevated BNP - She says she's on 20 mg lasix daily at home (listed allergy - rash, which she denies), but this wasn't on her med list - Will d/c thiazide diuretic - IV lasix BID 40 mg - I/O, daily weights - weights going down, good UOP - Continue to monitor   Elevated troponin: mild, no si/sx concerning for ACS.  Likely 2/2 above.  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  TOV today.  DVT  prophylaxis: xarelto Code Status: full  Family Communication: none at bedside - called husband to update. Disposition Plan: pending improvement in resp status afib and placement   Consultants:   none  Procedures:   none  Antimicrobials:  Anti-infectives (From admission, onward)   Start     Dose/Rate Route Frequency Ordered Stop   02/23/19 1000  cefTRIAXone (ROCEPHIN) 1 g in sodium chloride 0.9 % 100 mL IVPB     1 g 200 mL/hr over 30 Minutes Intravenous Every 24 hours 02/22/19 1433 02/27/19 0959   02/23/19 1000  azithromycin (ZITHROMAX) tablet 500 mg     500 mg Oral Daily 02/22/19 1433 02/27/19 0959   02/22/19 1145  cefTRIAXone (ROCEPHIN) 1 g in sodium chloride 0.9 % 100 mL IVPB     1 g 200 mL/hr over 30 Minutes Intravenous  Once 02/22/19 1139 02/22/19 1339   02/22/19 1145  azithromycin (ZITHROMAX) 500 mg in sodium chloride 0.9 % 250 mL IVPB     500 mg 250 mL/hr over 60 Minutes Intravenous  Once 02/22/19 1139 02/22/19 1424     Subjective: C/o chronic back pain. Asking for oxycodone scheduled.  Objective: Vitals:   02/23/19 1827 02/23/19 2001 02/24/19 0008 02/24/19 0329  BP: 108/80 112/89 98/74 114/90  Pulse:  94 92 (!) 101  Resp:  13 11 13   Temp:  98.3 F (36.8 C)  (!) 97.5 F (36.4 C)  TempSrc:  Oral  Oral  SpO2:  94% 97% 97%  Weight:    108.5 kg  Height:        Intake/Output Summary (Last 24 hours) at 02/24/2019 1202 Last data filed at 02/24/2019 0332 Gross per 24 hour  Intake 483 ml  Output 1400 ml  Net -917 ml   Filed Weights   02/22/19 1427 02/23/19 0439 02/24/19 0329  Weight: 110.3 kg 109.2 kg 108.5 kg    Examination:  General: No acute distress. Cardiovascular: Heart sounds show Axle Parfait regular rate, and rhythm.  Lungs: bibasilar crackles Abdomen: Soft, nontender, nondistended Neurological: Alert and oriented 3. Moves all extremities 4. Cranial nerves II through XII grossly intact.  No saddle anesthesia.  Intact symmetric strength to lower  extremities. Skin: Warm and dry. No rashes or lesions. Extremities: No clubbing or cyanosis. Bilateral 2+ LE edema. [name] Psychiatric: Mood and affect are normal. Insight and judgment are appropriate.    Data Reviewed: I have personally reviewed following labs and imaging studies  CBC: Recent Labs  Lab 02/22/19 1015 02/23/19 0313 02/24/19 0300  WBC 10.3 8.3 7.1  NEUTROABS 8.1*  --   --   HGB 12.6 12.1 12.4  HCT 39.1 36.1 35.3*  MCV 106.8* 102.8* 101.7*  PLT 160 122* 381*   Basic Metabolic Panel: Recent Labs  Lab 02/22/19 1015 02/23/19 0313 02/24/19 0300  NA 143 138 139  K 3.6 3.8 4.2  CL 100 101 97*  CO2 31 22 25   GLUCOSE 77 81 108*  BUN 34* 18 19  CREATININE 1.16* 0.91 1.20*  CALCIUM 8.9 8.3* 8.6*   GFR: Estimated Creatinine Clearance: 50.5 mL/min (Ghina Bittinger) (by C-G formula based on SCr of 1.2  mg/dL (H)). Liver Function Tests: Recent Labs  Lab 02/22/19 1015 02/24/19 0300  AST 26 20  ALT 26 27  ALKPHOS 52 49  BILITOT 1.1 1.0  PROT 5.4* 5.1*  ALBUMIN 3.1* 2.6*   No results for input(s): LIPASE, AMYLASE in the last 168 hours. No results for input(s): AMMONIA in the last 168 hours. Coagulation Profile: Recent Labs  Lab 02/22/19 1015  INR 1.1   Cardiac Enzymes: Recent Labs  Lab 02/22/19 1015 02/22/19 1459 02/22/19 2220 02/23/19 0313  CKTOTAL 73  --   --   --   TROPONINI 0.05* 0.04* 0.03* <0.03   BNP (last 3 results) No results for input(s): PROBNP in the last 8760 hours. HbA1C: No results for input(s): HGBA1C in the last 72 hours. CBG: No results for input(s): GLUCAP in the last 168 hours. Lipid Profile: No results for input(s): CHOL, HDL, LDLCALC, TRIG, CHOLHDL, LDLDIRECT in the last 72 hours. Thyroid Function Tests: No results for input(s): TSH, T4TOTAL, FREET4, T3FREE, THYROIDAB in the last 72 hours. Anemia Panel: No results for input(s): VITAMINB12, FOLATE, FERRITIN, TIBC, IRON, RETICCTPCT in the last 72 hours. Sepsis Labs: Recent Labs  Lab  02/22/19 1015 02/22/19 1459  LATICACIDVEN 1.1 1.3    Recent Results (from the past 240 hour(s))  Culture, blood (routine x 2)     Status: None (Preliminary result)   Collection Time: 02/22/19 10:10 AM  Result Value Ref Range Status   Specimen Description BLOOD RIGHT ANTECUBITAL  Final   Special Requests   Final    BOTTLES DRAWN AEROBIC AND ANAEROBIC Blood Culture results may not be optimal due to an excessive volume of blood received in culture bottles   Culture   Final    NO GROWTH 2 DAYS Performed at [facility] [address] [place], [place] [address]    Report Status PENDING  Incomplete  Urine culture     Status: Abnormal   Collection Time: 02/22/19 10:30 AM  Result Value Ref Range Status   Specimen Description URINE, RANDOM  Final   Special Requests NONE  Final   Culture (Ahnya Akre)  Final    <10,000 COLONIES/mL INSIGNIFICANT GROWTH Performed at [facility] [address] [place], [place] [address]    Report Status 02/23/2019 FINAL  Final  Culture, blood (routine x 2)     Status: None (Preliminary result)   Collection Time: 02/22/19 10:35 AM  Result Value Ref Range Status   Specimen Description BLOOD LEFT ANTECUBITAL  Final   Special Requests   Final    BOTTLES DRAWN AEROBIC AND ANAEROBIC Blood Culture adequate volume   Culture   Final    NO GROWTH 2 DAYS Performed at [facility], [place] [address] [place], [place] [address]    Report Status PENDING  Incomplete         Radiology Studies: Dg Chest 2 View  Result Date: 02/24/2019 CLINICAL DATA:  Follow-up pneumonia EXAM: CHEST - 2 VIEW COMPARISON:  02/22/2019 chest radiograph. FINDINGS: Stable cardiomediastinal silhouette with top-normal heart size. No pneumothorax. No pleural effusion. Patchy medial apical right lung opacity and mild patchy upper left lung opacity, not appreciably changed. IMPRESSION: No appreciable change in patchy bilateral upper lung opacities, which may represent multilobar  pneumonia. Continued chest radiograph follow-up advised. Electronically Signed   By: Ilona Sorrel M.D.   On: 02/24/2019 09:34        Scheduled Meds: . amLODipine  10 mg Oral Daily  . azithromycin  500 mg Oral Daily  . carvedilol  25 mg Oral BID WC  . diltiazem  30 mg Oral Q12H  . flecainide  100 mg Oral BID  . FLUoxetine  20 mg Oral Daily  . furosemide  40 mg Intravenous BID  . irbesartan  300 mg Oral Daily  . levothyroxine  225 mcg Oral Daily  . oxyCODONE  10 mg Oral QID  . predniSONE  30 mg Oral Q breakfast  . rivaroxaban  20 mg Oral Q supper  . sodium chloride flush  3 mL Intravenous Q12H  . tamsulosin  0.4 mg Oral QPC supper   Continuous Infusions: . sodium chloride    . cefTRIAXone (ROCEPHIN)  IV 1 g (02/24/19 0909)     LOS: 2 days    Time spent: over 30 min    Fayrene Helper, MD [facility] Pager AMION  If 7PM-7AM, please contact night-coverage [email or web address] Password [facility] 02/24/2019, 12:02 PM

## 2019-02-25 LAB — CBC
HCT: 32.9 % — ABNORMAL LOW (ref 36.0–46.0)
Hemoglobin: 11.1 g/dL — ABNORMAL LOW (ref 12.0–15.0)
MCH: 34.8 pg — ABNORMAL HIGH (ref 26.0–34.0)
MCHC: 33.7 g/dL (ref 30.0–36.0)
MCV: 103.1 fL — ABNORMAL HIGH (ref 80.0–100.0)
Platelets: 146 10*3/uL — ABNORMAL LOW (ref 150–400)
RBC: 3.19 MIL/uL — ABNORMAL LOW (ref 3.87–5.11)
RDW: 13.7 % (ref 11.5–15.5)
WBC: 6.9 10*3/uL (ref 4.0–10.5)
nRBC: 0 % (ref 0.0–0.2)

## 2019-02-25 LAB — MAGNESIUM: Magnesium: 1.6 mg/dL — ABNORMAL LOW (ref 1.7–2.4)

## 2019-02-25 LAB — BASIC METABOLIC PANEL
Anion gap: 12 (ref 5–15)
BUN: 29 mg/dL — ABNORMAL HIGH (ref 8–23)
CO2: 31 mmol/L (ref 22–32)
Calcium: 8.6 mg/dL — ABNORMAL LOW (ref 8.9–10.3)
Chloride: 95 mmol/L — ABNORMAL LOW (ref 98–111)
Creatinine, Ser: 1.84 mg/dL — ABNORMAL HIGH (ref 0.44–1.00)
GFR calc Af Amer: 31 mL/min — ABNORMAL LOW (ref >60)
GFR calc non Af Amer: 26 mL/min — ABNORMAL LOW (ref >60)
Glucose, Bld: 109 mg/dL — ABNORMAL HIGH (ref 70–99)
Potassium: 4 mmol/L (ref 3.5–5.1)
Sodium: 138 mmol/L (ref 135–145)

## 2019-02-25 MED ORDER — MAGNESIUM SULFATE 2 GM/50ML IV SOLN
2.0000 g | Freq: Once | INTRAVENOUS | Status: AC
  Administered 2019-02-25: 11:00:00 2 g via INTRAVENOUS
  Filled 2019-02-25: qty 50

## 2019-02-25 MED ORDER — CEPHALEXIN 500 MG PO CAPS
500.0000 mg | ORAL_CAPSULE | Freq: Three times a day (TID) | ORAL | Status: DC
  Administered 2019-02-25 – 2019-02-27 (×6): 500 mg via ORAL
  Filled 2019-02-25 (×7): qty 1

## 2019-02-25 NOTE — Progress Notes (Signed)
PROGRESS NOTE    Nicole Norton  CHE:[id number] DOB: [birth year] DOA: 02/22/2019 PCP: Hali Marry, MD   Brief Narrative: Per HPI  Nicole Norton is a 78 y.o. female with medical history significant of chronic atrial fibrillation on Xarelto, hypertension, hyperlipidemia, hypothyroidism who presents after a fall at home. She states that yesterday she slid off her bed, did not injure herself but could not get off the floor.  She decided to rest and see if she could get up eventually but she could not.  She denies any recent illnesses, no fevers or chills, no shortness of breath or cough, no chest pain, no nausea, vomiting, diarrhea or abdominal pain.  She does have chronic weakness.  She wears 1.5L nasal cannula O2 at nighttime.  She did miss doses of her medications yesterday.  ED Course: Patient was found to be in atrial fibrillation with RVR rate in the 130s.  She was given IV metoprolol without adequate control.  She was started on IV Cardizem drip.  Labs revealed creatinine 1.16, BNP 543, troponin 0 0.05 UA negative.  CT head and cervical spine without acute injury.  Pelvic x-ray without acute fracture.  Chest x-ray without acute cardiopulmonary disease.  Patient did have a desaturation down to 89% on room air.  CT head and cervical spine reveal airspace disease in the right upper lobe concerning for possible pneumonia.  She was started on azithromycin and Rocephin.  Assessment & Plan:   Principal Problem:   Atrial fibrillation with RVR (HCC) Active Problems:   Hypothyroidism   Essential hypertension, benign   Depression with anxiety   Fall at home, initial encounter   CKD (chronic kidney disease) stage 3, GFR 30-59 ml/min (HCC)   Chronic respiratory failure with hypoxia (HCC)  Acute on Chronic hypoxemic respiratory failure with community-acquired pneumonia Rule out COVID.  - Patient found to be saturating 89% on room air, she was placed on 2 L nasal cannula O2.   Chest x-ray was negative for acute cardiopulmonary disease, but CT head and cervical spine did reveal some airspace disease in right upper lobe concerning for possible pneumonia.  It does appear that patient has baseline nasal cannula O2 use of 1.5 L at home qhs - Repeat CXR 4/11 with patchy bilateral upper lung opacities - Started on Rocephin and azithromycin x 5 days switch to oral today.  - Follow blood cx. So far negative. Ucx insignificant growth. - She's afebrile, but given presence of community spread of COVID 19 and her bilateral findings on CXR will send COVID testing. Pt without hx of travel or sick contact and story does not seem typical, but will rule out.   - On droplet and contact precautions now  Atrial fibrillation RVR -Continue flecainide, Xarelto -Continue coreg 25 mg BID -Diltiazem 30 mg BID has been added as well  HFpEF Exacerbation - EF normal 10/2018 with grade 2 diastolic dysfunction - She appears overloaded on exam with bilateral LE edema (persistent), also with elevated BNP - She says she's on 20 mg lasix daily at home (listed allergy - rash, which she denies), but this wasn't on her med list - Will d/c thiazide diuretic - IV lasix BID 40 mg, now on hold  - I/O, daily weights - weights going down, good UOP - Continue to monitor   Elevated troponin: mild, no si/sx concerning for ACS.  Likely 2/2 above.  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   DVT prophylaxis: xarelto Code Status: full  Family Communication: none at bedside - called husband to update. Disposition Plan: pending improvement in resp status afib and placement   Consultants:   none  Procedures:   none  Antimicrobials:  Anti-infectives (From admission, onward)   Start     Dose/Rate Route Frequency Ordered Stop   02/25/19 0930  cephALEXin (KEFLEX) capsule 500 mg     500 mg Oral Every 8 hours 02/25/19 0852 02/28/19 0559   02/23/19 1000  cefTRIAXone (ROCEPHIN) 1 g in sodium chloride 0.9 % 100 mL IVPB  Status:  Discontinued     1 g 200 mL/hr over 30 Minutes Intravenous Every 24 hours 02/22/19 1433 02/25/19 0851   02/23/19 1000  azithromycin (ZITHROMAX) tablet 500 mg     500 mg Oral Daily 02/22/19 1433 02/27/19 0959   02/22/19 1145  cefTRIAXone (ROCEPHIN) 1 g in sodium chloride 0.9 % 100 mL IVPB     1 g 200 mL/hr over 30 Minutes Intravenous  Once 02/22/19 1139 02/22/19 1339   02/22/19 1145  azithromycin (ZITHROMAX) 500 mg in sodium chloride 0.9 % 250 mL IVPB     500 mg 250 mL/hr over 60 Minutes Intravenous  Once 02/22/19 1139 02/22/19 1424     Subjective: C/o chronic back pain. Asking for oxycodone scheduled.  Objective: Vitals:   02/24/19 1812 02/24/19 1954 02/25/19 0450 02/25/19 0926  BP: (!) 91/56 113/69 117/88 116/87  Pulse: 98 97 90   Resp:  16 14   Temp:  98 F (36.7 C) (!) 97.4 F (36.3 C)   TempSrc:  Oral Oral   SpO2: 96% 94% 96%   Weight:   106.3 kg   Height:        Intake/Output Summary (Last 24 hours) at 02/25/2019 1400 Last data filed at 02/25/2019 1037 Gross per 24 hour  Intake 550 ml  Output 600 ml  Net -50 ml   Filed Weights   02/23/19 0439 02/24/19 0329 02/25/19 0450  Weight: 109.2 kg 108.5 kg 106.3 kg    Examination:  General: No acute distress. Cardiovascular: Heart sounds show a regular rate, and rhythm.  Lungs: bibasilar crackles Abdomen: Soft,  nontender, nondistended Neurological: Alert and oriented 3. Moves all extremities 4. Cranial nerves II through XII grossly intact.  No saddle anesthesia.  Intact symmetric strength to lower extremities. Skin: Warm and dry. No rashes or lesions. Extremities: No clubbing or cyanosis. Bilateral 2+ LE edema. [name] Psychiatric: Mood and affect are normal. Insight and judgment are appropriate.    Data Reviewed: I have personally reviewed following labs and imaging studies  CBC: Recent Labs  Lab 02/22/19 1015 02/23/19 0313 02/24/19 0300 02/25/19 0638  WBC 10.3 8.3 7.1 6.9  NEUTROABS 8.1*  --   --   --   HGB 12.6 12.1 12.4 11.1*  HCT 39.1 36.1 35.3* 32.9*  MCV 106.8* 102.8* 101.7* 103.1*  PLT 160 122* 135* 542*   Basic Metabolic Panel: Recent Labs  Lab 02/22/19 1015 02/23/19 0313 02/24/19 0300 02/25/19 7062  NA 143 138 139 138  K 3.6 3.8 4.2 4.0  CL 100 101 97* 95*  CO2 31 22 25 31   GLUCOSE 77 81 108* 109*  BUN 34* 18 19 29*  CREATININE 1.16* 0.91 1.20* 1.84*  CALCIUM 8.9 8.3* 8.6* 8.6*  MG  --   --   --  1.6*   GFR: Estimated Creatinine Clearance: 32.6 mL/min (A) (by C-G formula based on SCr of 1.84 mg/dL (H)). Liver Function Tests: Recent Labs  Lab 02/22/19 1015 02/24/19 0300  AST 26 20  ALT 26 27  ALKPHOS 52 49  BILITOT 1.1 1.0  PROT 5.4* 5.1*  ALBUMIN 3.1* 2.6*   No results for input(s): LIPASE, AMYLASE in the last 168 hours. No results for input(s): AMMONIA in the last 168 hours. Coagulation Profile: Recent Labs  Lab 02/22/19 1015  INR 1.1   Cardiac Enzymes: Recent Labs  Lab 02/22/19 1015 02/22/19 1459 02/22/19 2220 02/23/19 0313  CKTOTAL 73  --   --   --   TROPONINI 0.05* 0.04* 0.03* <0.03   BNP (last 3 results) No results for input(s): PROBNP in the last 8760 hours. HbA1C: No results for input(s): HGBA1C in the last 72 hours. CBG: No results for input(s): GLUCAP in the last 168 hours. Lipid Profile: No results for input(s): CHOL, HDL,  LDLCALC, TRIG, CHOLHDL, LDLDIRECT in the last 72 hours. Thyroid Function Tests: No results for input(s): TSH, T4TOTAL, FREET4, T3FREE, THYROIDAB in the last 72 hours. Anemia Panel: No results for input(s): VITAMINB12, FOLATE, FERRITIN, TIBC, IRON, RETICCTPCT in the last 72 hours. Sepsis Labs: Recent Labs  Lab 02/22/19 1015 02/22/19 1459  LATICACIDVEN 1.1 1.3    Recent Results (from the past 240 hour(s))  Culture, blood (routine x 2)     Status: None (Preliminary result)   Collection Time: 02/22/19 10:10 AM  Result Value Ref Range Status   Specimen Description BLOOD RIGHT ANTECUBITAL  Final   Special Requests   Final    BOTTLES DRAWN AEROBIC AND ANAEROBIC Blood Culture results may not be optimal due to an excessive volume of blood received in culture bottles   Culture   Final    NO GROWTH 3 DAYS Performed at [facility], [place] [address] [place], [place] [address]    Report Status PENDING  Incomplete  Urine culture     Status: Abnormal   Collection Time: 02/22/19 10:30 AM  Result Value Ref Range Status   Specimen Description URINE, RANDOM  Final   Special Requests NONE  Final   Culture (A)  Final    <10,000 COLONIES/mL INSIGNIFICANT GROWTH Performed at [place] [address], [place], [place] [address]    Report Status 02/23/2019 FINAL  Final  Culture, blood (routine x 2)     Status: None (Preliminary result)   Collection Time: 02/22/19 10:35 AM  Result Value Ref Range Status   Specimen Description BLOOD LEFT ANTECUBITAL  Final   Special Requests   Final    BOTTLES DRAWN AEROBIC AND ANAEROBIC Blood Culture adequate volume   Culture   Final    NO GROWTH 3 DAYS Performed at [facility], [place] [address], [place],  [address]    Report Status PENDING  Incomplete         Radiology Studies: Dg Chest 2 View  Result Date: 02/24/2019 CLINICAL DATA:  Follow-up pneumonia EXAM: CHEST - 2 VIEW COMPARISON:  02/22/2019 chest radiograph. FINDINGS:  Stable cardiomediastinal silhouette with top-normal heart  size. No pneumothorax. No pleural effusion. Patchy medial apical right lung opacity and mild patchy upper left lung opacity, not appreciably changed. IMPRESSION: No appreciable change in patchy bilateral upper lung opacities, which may represent multilobar pneumonia. Continued chest radiograph follow-up advised. Electronically Signed   By: Ilona Sorrel M.D.   On: 02/24/2019 09:34        Scheduled Meds: . amLODipine  10 mg Oral Daily  . azithromycin  500 mg Oral Daily  . carvedilol  25 mg Oral BID WC  . cephALEXin  500 mg Oral Q8H  . diltiazem  30 mg Oral Q12H  . flecainide  100 mg Oral BID  . FLUoxetine  20 mg Oral Daily  . levothyroxine  225 mcg Oral Daily  . oxyCODONE  10 mg Oral QID  . predniSONE  30 mg Oral Q breakfast  . rivaroxaban  20 mg Oral Q supper  . sodium chloride flush  10-40 mL Intracatheter Q12H  . sodium chloride flush  3 mL Intravenous Q12H   Continuous Infusions: . sodium chloride 250 mL (02/25/19 1033)     LOS: 3 days    Time spent: over 30 min    Berle Mull, MD [facility] Pager AMION  If 7PM-7AM, please contact night-coverage [email or web address] Password [email or web address] 02/25/2019, 2:00 PM

## 2019-02-25 NOTE — Plan of Care (Signed)
  Problem: Clinical Measurements: Goal: Ability to maintain clinical measurements within normal limits will improve Outcome: Progressing   Problem: Clinical Measurements: Goal: Respiratory complications will improve Outcome: Progressing   

## 2019-02-26 LAB — COMPREHENSIVE METABOLIC PANEL
ALT: 24 U/L (ref 0–44)
AST: 16 U/L (ref 15–41)
Albumin: 3 g/dL — ABNORMAL LOW (ref 3.5–5.0)
Alkaline Phosphatase: 48 U/L (ref 38–126)
Anion gap: 13 (ref 5–15)
BUN: 28 mg/dL — ABNORMAL HIGH (ref 8–23)
CO2: 31 mmol/L (ref 22–32)
Calcium: 9.4 mg/dL (ref 8.9–10.3)
Chloride: 95 mmol/L — ABNORMAL LOW (ref 98–111)
Creatinine, Ser: 1.84 mg/dL — ABNORMAL HIGH (ref 0.44–1.00)
GFR calc Af Amer: 31 mL/min — ABNORMAL LOW (ref >60)
GFR calc non Af Amer: 26 mL/min — ABNORMAL LOW (ref >60)
Glucose, Bld: 93 mg/dL (ref 70–99)
Potassium: 3.6 mmol/L (ref 3.5–5.1)
Sodium: 139 mmol/L (ref 135–145)
Total Bilirubin: 0.7 mg/dL (ref 0.3–1.2)
Total Protein: 5.7 g/dL — ABNORMAL LOW (ref 6.5–8.1)

## 2019-02-26 LAB — CBC WITH DIFFERENTIAL/PLATELET
Abs Immature Granulocytes: 0.06 10*3/uL (ref 0.00–0.07)
Basophils Absolute: 0 10*3/uL (ref 0.0–0.1)
Basophils Relative: 0 %
Eosinophils Absolute: 0 10*3/uL (ref 0.0–0.5)
Eosinophils Relative: 0 %
HCT: 35.4 % — ABNORMAL LOW (ref 36.0–46.0)
Hemoglobin: 12.2 g/dL (ref 12.0–15.0)
Immature Granulocytes: 1 %
Lymphocytes Relative: 10 %
Lymphs Abs: 0.9 10*3/uL (ref 0.7–4.0)
MCH: 35.4 pg — ABNORMAL HIGH (ref 26.0–34.0)
MCHC: 34.5 g/dL (ref 30.0–36.0)
MCV: 102.6 fL — ABNORMAL HIGH (ref 80.0–100.0)
Monocytes Absolute: 0.6 10*3/uL (ref 0.1–1.0)
Monocytes Relative: 8 %
Neutro Abs: 6.7 10*3/uL (ref 1.7–7.7)
Neutrophils Relative %: 81 %
Platelets: 202 10*3/uL (ref 150–400)
RBC: 3.45 MIL/uL — ABNORMAL LOW (ref 3.87–5.11)
RDW: 13.5 % (ref 11.5–15.5)
WBC: 8.3 10*3/uL (ref 4.0–10.5)
nRBC: 0 % (ref 0.0–0.2)

## 2019-02-26 LAB — MAGNESIUM: Magnesium: 1.9 mg/dL (ref 1.7–2.4)

## 2019-02-26 MED ORDER — LUBIPROSTONE 24 MCG PO CAPS
24.0000 ug | ORAL_CAPSULE | Freq: Two times a day (BID) | ORAL | Status: DC
  Administered 2019-02-26 – 2019-03-02 (×8): 24 ug via ORAL
  Filled 2019-02-26 (×8): qty 1

## 2019-02-26 MED ORDER — PREDNISONE 10 MG PO TABS
10.0000 mg | ORAL_TABLET | Freq: Once | ORAL | Status: AC
  Administered 2019-02-26: 21:00:00 10 mg via ORAL
  Filled 2019-02-26: qty 1

## 2019-02-26 MED ORDER — PREDNISONE 20 MG PO TABS
40.0000 mg | ORAL_TABLET | Freq: Every day | ORAL | Status: DC
  Administered 2019-02-27 – 2019-03-02 (×4): 40 mg via ORAL
  Filled 2019-02-26 (×4): qty 2

## 2019-02-26 MED ORDER — PREGABALIN 75 MG PO CAPS
75.0000 mg | ORAL_CAPSULE | Freq: Three times a day (TID) | ORAL | Status: DC
  Administered 2019-02-26 – 2019-03-02 (×12): 75 mg via ORAL
  Filled 2019-02-26 (×12): qty 1

## 2019-02-26 MED ORDER — PANTOPRAZOLE SODIUM 40 MG PO TBEC
40.0000 mg | DELAYED_RELEASE_TABLET | Freq: Every day | ORAL | Status: DC
  Administered 2019-02-26 – 2019-03-02 (×5): 40 mg via ORAL
  Filled 2019-02-26 (×5): qty 1

## 2019-02-26 MED ORDER — HYDROXYZINE HCL 25 MG PO TABS
25.0000 mg | ORAL_TABLET | Freq: Three times a day (TID) | ORAL | Status: DC | PRN
  Administered 2019-02-26: 18:00:00 25 mg via ORAL
  Filled 2019-02-26: qty 1

## 2019-02-26 NOTE — Progress Notes (Signed)
Physical Therapy Treatment Patient Details Name: Nicole Norton MRN: [id number] DOB: 1941/08/25 Today's Date: 02/26/2019    History of Present Illness Pt is a 78 yo female s/p Afib with RVR and fall at home sliding off bed unable to get up- no injury reported. Pt waited on floor for 24 hours, On 02/24/19 pt now COVID R/O. Pmhx: hypothyroidiism, HTN, depression, anxiety, chronic kidney disease, chronic respiratory failure.    PT Comments    Pt pleasant but only agreeable to limited mobility stating she feels more fatigued and SOB today and discussed change in medication with MD and will not be willing to sit in chair or ambulate further until medication changed. Pt was willing for limited mobility in room and initiating bil LE HEP in supine. Pt encouraged to continue to increase activity tolerance as able, continue progressing HEP. 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   BP supine with HOB 20 degrees 120/100 Sitting 94/72 End of session 100/86 No dizziness, SPO2 90-93% on 1.5L   Follow Up Recommendations  SNF;Supervision for mobility/OOB     Equipment Recommendations  None recommended by PT    Recommendations for Other Services       Precautions / Restrictions Precautions Precautions: Fall Precaution Comments: watch BP and sats Restrictions Weight Bearing Restrictions: No    Mobility  Bed Mobility Overal bed mobility: Needs Assistance Bed Mobility: Supine to Sit;Sit to Supine     Supine to sit: Min guard;HOB elevated Sit to supine: Min assist;HOB elevated   General bed mobility comments: HOB 30 degrees with pt able to use rail and transition from supine to sitting without assist. Min assist to bring legs back to surface on return to bed. PT refused OOB to chair  Transfers Overall transfer level: Needs assistance Equipment used: Rolling walker (2 wheeled) Transfers: Sit to/from [facility] Sit to Stand: Min guard Stand pivot  transfers: Min guard       General transfer comment: minguard to pivot from bed to [facility]. pt then stood from [facility] to use RW to sink  Ambulation/Gait Ambulation/Gait assistance: Min guard Gait Distance (Feet): 8 Feet Assistive device: Rolling walker (2 wheeled) Gait Pattern/deviations: Step-through pattern;Trunk flexed   Gait velocity interpretation: <1.8 ft/sec, indicate of risk for recurrent falls General Gait Details: Pt walked from [facility] to sink then back to bed with use of RW for support and stability. PT declined sitting up in chair or further gait   Stairs             Wheelchair Mobility    Modified Rankin (Stroke Patients Only)       Balance Overall balance assessment: Needs assistance Sitting-balance support: No upper extremity supported;Feet supported Sitting balance-Leahy Scale: Good     Standing balance support: Bilateral upper extremity supported;During functional activity Standing balance-Leahy Scale: Fair Standing balance comment: pt able to stand from bed without UE assist                            Cognition Arousal/Alertness: Awake/alert Behavior During Therapy: [facility] for tasks assessed/performed;Anxious;Agitated Overall Cognitive Status: Within Functional Limits for tasks assessed                                 General Comments: Pt very particular about how session proceeds. Pt       Exercises General Exercises - Lower Extremity Heel  Slides: AROM;10 reps;Supine;Both    General Comments        Pertinent Vitals/Pain Pain Assessment: 0-10 Faces Pain Scale: Hurts little more Pain Location: back Pain Descriptors / Indicators: Discomfort;Guarding Pain Intervention(s): Limited activity within patient's tolerance    Home Living                      Prior Function            PT Goals (current goals can now be found in the care plan section) Acute Rehab PT Goals Patient Stated Goal: to gohome Progress  towards PT goals: Progressing toward goals    Frequency           PT Plan Current plan remains appropriate    Co-evaluation              AM-PAC PT "6 Clicks" Mobility   Outcome Measure  Help needed turning from your back to your side while in a flat bed without using bedrails?: A Little Help needed moving from lying on your back to sitting on the side of a flat bed without using bedrails?: A Little Help needed moving to and from a bed to a chair (including a wheelchair)?: A Little Help needed standing up from a chair using your arms (e.g., wheelchair or bedside chair)?: A Little Help needed to walk in hospital room?: A Little Help needed climbing 3-5 steps with a railing? : A Lot 6 Click Score: 17    End of Session Equipment Utilized During Treatment: Oxygen Activity Tolerance: Patient tolerated treatment well Patient left: in bed;with call bell/phone within reach Nurse Communication: Mobility status PT Visit Diagnosis: Unsteadiness on feet (R26.81);Muscle weakness (generalized) (M62.81)     Time: 6015-6153 PT Time Calculation (min) (ACUTE ONLY): 32 min  Charges:  $Gait Training: 8-22 mins $Therapeutic Activity: 8-22 mins                     [place], PT Acute Rehabilitation Services Pager: [phone] Office: [place] 02/26/2019, 1:26 PM

## 2019-02-26 NOTE — Plan of Care (Signed)

## 2019-02-26 NOTE — Progress Notes (Signed)
PROGRESS NOTE    Nicole Norton  JKD:[id number] DOB: Jan 10, 1941 DOA: 02/22/2019 PCP: Hali Marry, MD   Brief Narrative: Per HPI  Nicole Norton is a 78 y.o. female with medical history significant of chronic atrial fibrillation on Xarelto, hypertension, hyperlipidemia, hypothyroidism who presents after a fall at home. She states that yesterday she slid off her bed, did not injure herself but could not get off the floor.  She decided to rest and see if she could get up eventually but she could not.  She denies any recent illnesses, no fevers or chills, no shortness of breath or cough, no chest pain, no nausea, vomiting, diarrhea or abdominal pain.  She does have chronic weakness.  She wears 1.5L nasal cannula O2 at nighttime.  She did miss doses of her medications yesterday.  ED Course: Patient was found to be in atrial fibrillation with RVR rate in the 130s.  She was given IV metoprolol without adequate control.  She was started on IV Cardizem drip.  Labs revealed creatinine 1.16, BNP 543, troponin 0 0.05 UA negative.  CT head and cervical spine without acute injury.  Pelvic x-ray without acute fracture.  Chest x-ray without acute cardiopulmonary disease.  Patient did have a desaturation down to 89% on room air.  CT head and cervical spine reveal airspace disease in the right upper lobe concerning for possible pneumonia.  She was started on azithromycin and Rocephin.  Assessment & Plan:  Acute on Chronic hypoxemic respiratory failure with community-acquired pneumonia Rule out COVID.  - Patient found to be saturating 89% on room air, she was placed on 2 L nasal cannula O2.  Chest x-ray was negative for acute cardiopulmonary disease, but CT head and cervical spine did reveal some airspace disease in right upper lobe concerning for possible pneumonia.  It does appear that patient has baseline nasal cannula O2 use of 1.5 L at home qhs - Repeat CXR 4/11 with patchy bilateral  upper lung opacities - Started on Rocephin and azithromycin x 5 days switch to oral today.  - Follow blood cx. So far negative. Ucx insignificant growth. - She's afebrile, but given presence of community spread of COVID 19 and her bilateral findings on CXR will send COVID testing. Pt without hx of travel or sick contact and story does not seem typical, but will rule out.   - On droplet and contact precautions now  Atrial fibrillation RVR -Continue flecainide, Xarelto -Continue coreg 25 mg BID -Diltiazem 30 mg BID has been added as well  HFpEF Exacerbation - EF normal 10/2018 with grade 2 diastolic dysfunction - She appears overloaded on exam with bilateral LE edema (persistent), also with elevated BNP - She says she's on 20 mg lasix daily at home (listed allergy - rash, which she denies), but this wasn't on her med list - Will d/c thiazide diuretic - IV lasix BID 40 mg, now on hold  - I/O, daily weights - weights going down, good UOP - Continue to monitor   Elevated troponin: mild, no si/sx concerning for ACS.  Likely 2/2 above.  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 I would increase it back to 40 mg daily and monitor.  Thrombocytopenia: continue to monitor  Urinary Retention: pt with foley in place, start flomax.   DVT prophylaxis: xarelto Code Status: full  Family Communication: none at bedside - called husband to update. Disposition Plan: pending  improvement in resp status afib and placement   Consultants:   none  Procedures:   none  Antimicrobials:  Anti-infectives (From admission, onward)   Start     Dose/Rate Route Frequency Ordered Stop   02/25/19 0930  cephALEXin (KEFLEX) capsule 500 mg     500 mg Oral Every 8 hours 02/25/19 0852 02/28/19 0559   02/23/19 1000  cefTRIAXone (ROCEPHIN) 1 g in sodium chloride 0.9 % 100 mL IVPB  Status:  Discontinued     1 g 200 mL/hr over 30 Minutes Intravenous Every 24 hours 02/22/19 1433 02/25/19 0851   02/23/19 1000  azithromycin (ZITHROMAX) tablet 500 mg     500 mg Oral Daily 02/22/19 1433 02/26/19 0855   02/22/19 1145  cefTRIAXone (ROCEPHIN) 1 g in sodium chloride 0.9 % 100 mL IVPB     1 g 200 mL/hr over 30 Minutes Intravenous  Once 02/22/19 1139 02/22/19 1339   02/22/19 1145  azithromycin (ZITHROMAX) 500 mg in sodium chloride 0.9 % 250 mL IVPB     50 0 mg 250 mL/hr over 60 Minutes Intravenous  Once 02/22/19 1139 02/22/19 1424     Subjective: Reports generalized itching.  Generalized feeling fatigue.  No nausea no vomiting.  Reports soft BM.  No fever no chills.  No chest pain or abdominal pain.  Objective: Vitals:   02/25/19 2109 02/26/19 0500 02/26/19 0533 02/26/19 1320  BP: 125/83  (!) 133/94 100/80  Pulse:   95   Resp:   18   Temp:   97.7 F (36.5 C)   TempSrc:   Oral   SpO2:   98% 90%  Weight:  106.4 kg    Height:        Intake/Output Summary (Last 24 hours) at 02/26/2019 1828 Last data filed at 02/26/2019 1000 Gross per 24 hour  Intake 243 ml  Output 600 ml  Net -357 ml   Filed Weights   02/24/19 0329 02/25/19 0450 02/26/19 0500  Weight: 108.5 kg 106.3 kg 106.4 kg    Examination:  General: No acute distress. Cardiovascular: Heart sounds show a regular rate, and rhythm.  Lungs: bibasilar crackles Abdomen: Soft, nontender, nondistended Neurological: Alert and oriented 3. Moves all extremities 4. Cranial nerves II through XII grossly intact.  No saddle  anesthesia.  Intact symmetric strength to lower extremities. Skin: Warm and dry. No rashes or lesions. Extremities: No clubbing or cyanosis. Bilateral 2+ LE edema. [name] Psychiatric: Mood and affect are normal. Insight and judgment are appropriate.    Data Reviewed: I have personally reviewed following labs and imaging studies  CBC: Recent Labs  Lab 02/22/19 1015 02/23/19 0313 02/24/19 0300 02/25/19 0638 02/26/19 0620  WBC 10.3 8.3 7.1 6.9 8.3  NEUTROABS 8.1*  --   --   --  6.7  HGB 12.6 12.1 12.4 11.1* 12.2  HCT 39.1 36.1 35.3* 32.9* 35.4*  MCV 106.8* 102.8* 101.7* 103.1* 102.6*  PLT 160 122* 135* 146* 488   Basic Metabolic Panel: Recent Labs  Lab 02/22/19 1015 02/23/19 0313 02/24/19 0300 02/25/19 0638 02/26/19 0620  NA 143 138 139 138 139  K 3.6 3.8 4.2 4.0 3.6  CL 100 101 97* 95* 95*  CO2 31 22 25 31 31   GLUCOSE 77 81 108* 109* 93  BUN 34* 18 19 29* 28*  CREATININE 1.16* 0.91 1.20* 1.84* 1.84*  CALCIUM 8.9 8.3* 8.6* 8.6* 9.4  MG  --   --   --  1.6* 1.9   GFR: Estimated Creatinine Clearance: 32.6 mL/min (A) (by C-G formula based on SCr of 1.84 mg/dL (H)). Liver Function Tests: Recent Labs  Lab 02/22/19 1015 02/24/19 0300 02/26/19 0620  AST 26 20 16   ALT 26 27 24   ALKPHOS 52 49 48  BILITOT 1.1 1.0 0.7  PROT 5.4* 5.1* 5.7*  ALBUMIN 3.1* 2.6* 3.0*   No results for input(s): LIPASE, AMYLASE in the last 168 hours. No results for input(s): AMMONIA in the last 168 hours. Coagulation Profile: Recent Labs  Lab 02/22/19 1015  INR 1.1   Cardiac Enzymes: Recent Labs  Lab 02/22/19 1015 02/22/19 1459 02/22/19 2220 02/23/19 0313  CKTOTAL 73  --   --   --   TROPONINI 0.05* 0.04* 0.03* <0.03   BNP (last 3 results) No results for input(s): PROBNP in the last 8760 hours. HbA1C: No results for input(s): HGBA1C in the last 72 hours. CBG: No results for input(s): GLUCAP in the last 168 hours. Lipid Profile: No results for input(s): CHOL, HDL, LDLCALC, TRIG,  CHOLHDL, LDLDIRECT in the last 72 hours. Thyroid Function Tests: No results for input(s): TSH, T4TOTAL, FREET4, T3FREE, THYROIDAB in the last 72 hours. Anemia Panel: No results for input(s): VITAMINB12, FOLATE, FERRITIN, TIBC, IRON, RETICCTPCT in the last 72 hours. Sepsis Labs: Recent Labs  Lab 02/22/19 1015 02/22/19 1459  LATICACIDVEN 1.1 1.3    Recent Results (from the past 240 hour(s))  Culture, blood (routine x 2)     Status: None (Preliminary result)   Collection Time: 02/22/19 10:10 AM  Result Value Ref Range Status   Specimen Description BLOOD RIGHT ANTECUBITAL  Final   Special Requests   Final    BOTTLES DRAWN AEROBIC AND ANAEROBIC Blood Culture results may not be optimal due to an excessive volume of blood received in culture bottles   Culture   Final    NO GROWTH 4 DAYS Performed at [facility], [place] [address] [place], [place] [address]    Report Status PENDING  Incomplete  Urine culture     Status: Abnormal   Collection Time: 02/22/19 10:30 AM  Result Value Ref Range Status   Specimen Description URINE, RANDOM  Final   Special Requests NONE  Final   Culture (A)  Final    <10,000 COLONIES/mL INSIGNIFICANT GROWTH Performed at [facility] [address], [place], [place] [address]    Report Status 02/23/2019 FINAL  Final  Culture, blood (routine x 2)     Status: None (Preliminary result)   Collection Time: 02/22/19 10:35 AM  Result Value Ref Range Status   Specimen Description BLOOD LEFT ANTECUBITAL  Final   Special Requests   Final    BOTTLES DRAWN AEROBIC AND ANAEROBIC Blood Culture adequate volume   Culture   Final    NO GROWTH 4 DAYS Performed at [facility], [place] [address], [place], [place] [address]    Report Status PENDING  Incomplete         Radiology Studies: No results found.      Scheduled Meds: . amLODipine  10 mg Oral Daily  . carvedilol  25 mg Oral BID WC  . cephALEXin  500 mg Oral Q8H  . diltiazem  30 mg Oral  Q12H  . flecainide  100 mg Oral BID  . FLUoxetine  20 mg Oral Daily  . levothyroxine  225 mcg Oral Daily  . lubiprostone  24 mcg Oral BID WC  . oxyCODONE  10 mg Oral QID  . pantoprazole  40 mg Oral Daily  . predniSONE  10 mg Oral Once  . [START ON 02/27/2019] predniSONE  40 mg Oral Q breakfast  . pregabalin  75 mg Oral TID  . rivaroxaban  20 mg Oral Q supper  . sodium chloride flush  10-40 mL Intracatheter Q12H  . sodium chloride flush  3 mL Intravenous Q12H   Continuous Infusions: . sodium chloride 250 mL (02/25/19 1033)     LOS: 4 days    Time spent: over 30 min    Berle Mull, MD [facility] Pager AMION  If 7PM-7AM, please contact night-coverage [email or web address] Password [facility] 02/26/2019, 6:28 PM

## 2019-02-26 NOTE — Progress Notes (Signed)
Occupational Therapy Treatment Patient Details Name: Nicole Norton MRN: [id number] DOB: Jul 16, 1941 Today's Date: 02/26/2019    History of present illness Pt is a 78 yo female s/p Afib with RVR and fall at home sliding off bed unable to get up- no injury reported. Pt waited on floor for 24 hours, On 02/24/19 pt now COVID R/O. Pmhx: hypothyroidiism, HTN, depression, anxiety, chronic kidney disease, chronic respiratory failure.   OT comments  Pt continues to present with decreased activity tolerance. Pt performing stand pivot to [facility] with Min Guard A for safety and increased time. Pt reporting dizziness. BP 118/108 sitting at [facility] and BP 84/74 once supine in bed. Pt SpO2 95%-93% on 2L. Denies SOB. Continue to recommend dc to SNF and will continue to follow acutely as admitted.    Follow Up Recommendations  SNF;Supervision - Intermittent    Equipment Recommendations  3 in 1 bedside commode(bariatric)    Recommendations for Other Services      Precautions / Restrictions Precautions Precautions: Fall;Other (comment) Precaution Comments: 2L O2 and watch BP Restrictions Weight Bearing Restrictions: No       Mobility Bed Mobility Overal bed mobility: Needs Assistance Bed Mobility: Supine to Sit;Sit to Supine     Supine to sit: Min guard;HOB elevated Sit to supine: Min guard   General bed mobility comments: Min Guard A for safety and increased time needed.  Transfers Overall transfer level: Needs assistance Equipment used: Rolling walker (2 wheeled) Transfers: Sit to/from Stand Sit to Stand: Min guard Stand pivot transfers: Min guard;+2 safety/equipment       General transfer comment: Min Guard A for safety. Increased time. Pt reaching out for objects to hold on the somehting but declined RW for stand pivot to [facility]    Balance Overall balance assessment: Needs assistance Sitting-balance support: No upper extremity supported;Feet supported Sitting balance-Leahy Scale:  Fair     Standing balance support: Bilateral upper extremity supported;During functional activity Standing balance-Leahy Scale: Poor Standing balance comment: min guard for static standing                           ADL either performed or assessed with clinical judgement   ADL Overall ADL's : Needs assistance/impaired                         Toilet Transfer: Min Statistician Details (indicate cue type and reason): Min Guard A for safety Toileting- Clothing Manipulation and Hygiene: Min guard;Sitting/lateral lean Toileting - Clothing Manipulation Details (indicate cue type and reason): Min Guard A for safety while pt performs peri care after urination     Functional mobility during ADLs: Min guard(stand pivot only) General ADL Comments: Pt requiring increased time due to poor activity tolerance. Once at [facility], pt reporting dizziness. BP while sitting on toilet was 118/108. Once returned to supine in bed, BP 84/74 . Notified RN     Vision   Vision Assessment?: No apparent visual deficits   Perception     Praxis      Cognition Arousal/Alertness: Awake/alert Behavior During Therapy: WFL for tasks assessed/performed;Anxious;Agitated Overall Cognitive Status: Within Functional Limits for tasks assessed                                 General Comments: Pt very particular about how session proceeds. Pt  Exercises     Shoulder Instructions       General Comments      Pertinent Vitals/ Pain       Pain Assessment: Faces Faces Pain Scale: Hurts little more Pain Location: back Pain Descriptors / Indicators: Discomfort;Guarding Pain Intervention(s): Monitored during session;Limited activity within patient's tolerance;Repositioned  Home Living                                          Prior Functioning/Environment              Frequency  Min 3X/week        Progress Toward  Goals  OT Goals(current goals can now be found in the care plan section)  Progress towards OT goals: Progressing toward goals  Acute Rehab OT Goals Patient Stated Goal: to gohome OT Goal Formulation: With patient Time For Goal Achievement: 03/09/19 Potential to Achieve Goals: Good ADL Goals Pt Will Perform Upper Body Bathing: with modified independence;sitting Pt Will Perform Lower Body Dressing: with modified independence;sit to/from stand Pt Will Transfer to Toilet: with modified independence Pt Will Perform Toileting - Clothing Manipulation and hygiene: with modified independence Pt/caregiver will Perform Home Exercise Program: Both right and left upper extremity;Independently;With written HEP provided Additional ADL Goal #1: Pt will state 3 energy conservation strategies to implement at home.  Plan Discharge plan remains appropriate    Co-evaluation                 AM-PAC OT "6 Clicks" Daily Activity     Outcome Measure   Help from another person eating meals?: None Help from another person taking care of personal grooming?: A Little Help from another person toileting, which includes using toliet, bedpan, or urinal?: A Lot Help from another person bathing (including washing, rinsing, drying)?: A Lot Help from another person to put on and taking off regular upper body clothing?: A Little Help from another person to put on and taking off regular lower body clothing?: A Lot 6 Click Score: 16    End of Session Equipment Utilized During Treatment: Oxygen(2L)  OT Visit Diagnosis: Unsteadiness on feet (R26.81);Muscle weakness (generalized) (M62.81);Pain Pain - part of body: (back)   Activity Tolerance Patient limited by fatigue   Patient Left with call bell/phone within reach;in bed   Nurse Communication Mobility status;Other (comment)(Pt asking for "ointment". SpO2)        Time: 0962-8366 OT Time Calculation (min): 29 min  Charges: OT General Charges $OT  Visit: 1 Visit OT Treatments $Self Care/Home Management : 23-37 mins  Power, OTR/L Acute Rehab Pager: [phone] Office: [place] 02/26/2019, 11:20 AM

## 2019-02-27 LAB — BASIC METABOLIC PANEL
Anion gap: 11 (ref 5–15)
BUN: 27 mg/dL — ABNORMAL HIGH (ref 8–23)
CO2: 32 mmol/L (ref 22–32)
Calcium: 9.1 mg/dL (ref 8.9–10.3)
Chloride: 97 mmol/L — ABNORMAL LOW (ref 98–111)
Creatinine, Ser: 1.99 mg/dL — ABNORMAL HIGH (ref 0.44–1.00)
GFR calc Af Amer: 28 mL/min — ABNORMAL LOW (ref >60)
GFR calc non Af Amer: 24 mL/min — ABNORMAL LOW (ref >60)
Glucose, Bld: 106 mg/dL — ABNORMAL HIGH (ref 70–99)
Potassium: 4.1 mmol/L (ref 3.5–5.1)
Sodium: 140 mmol/L (ref 135–145)

## 2019-02-27 LAB — CULTURE, BLOOD (ROUTINE X 2)
Culture: NO GROWTH
Culture: NO GROWTH
Special Requests: ADEQUATE

## 2019-02-27 LAB — NOVEL CORONAVIRUS, NAA (HOSP ORDER, SEND-OUT TO REF LAB; TAT 18-24 HRS): SARS-CoV-2, NAA: NOT DETECTED

## 2019-02-27 MED ORDER — CEPHALEXIN 500 MG PO CAPS
500.0000 mg | ORAL_CAPSULE | Freq: Three times a day (TID) | ORAL | Status: AC
  Administered 2019-02-27: 500 mg via ORAL
  Filled 2019-02-27: qty 1

## 2019-02-27 MED ORDER — SODIUM CHLORIDE 0.9 % IV SOLN: INTRAVENOUS | Status: AC

## 2019-02-27 MED ORDER — SODIUM CHLORIDE 0.9 % IV SOLN
INTRAVENOUS | Status: DC
  Administered 2019-02-27: 10:00:00 via INTRAVENOUS

## 2019-02-27 NOTE — Progress Notes (Addendum)
Patient's husband [name] called CSW back and reports the best number to reach him is [phone]. He reports he will need to discuss with his wife regarding bed offers, CSW noted that patient received physical list of bed offers yesterday 4/13. CSW offered to review bed offers with him, he declined. [name] states once he speaks with patient today and reviews the options they should have a decision made as to which SNF facility they choose.   Update: Family has chose [facility], facility will need COVID-19 results back before patient can be accepted. Patient tested on 4/11, pending results.   [place], [place]

## 2019-02-27 NOTE — Progress Notes (Signed)
PROGRESS NOTE    Nicole Norton  ION:[id number] DOB: 1941-11-03 DOA: 02/22/2019 PCP: Hali Marry, MD   Brief Narrative: Per HPI  Nicole Norton is a 78 y.o. female with medical history significant of chronic atrial fibrillation on Xarelto, hypertension, hyperlipidemia, hypothyroidism who presents after a fall at home. She states that yesterday she slid off her bed, did not injure herself but could not get off the floor.  She decided to rest and see if she could get up eventually but she could not.  She denies any recent illnesses, no fevers or chills, no shortness of breath or cough, no chest pain, no nausea, vomiting, diarrhea or abdominal pain.  She does have chronic weakness.  She wears 1.5L nasal cannula O2 at nighttime.  She did miss doses of her medications yesterday.  ED Course: Patient was found to be in atrial fibrillation with RVR rate in the 130s.  She was given IV metoprolol without adequate control.  She was started on IV Cardizem drip.  Labs revealed creatinine 1.16, BNP 543, troponin 0 0.05 UA negative.  CT head and cervical spine without acute injury.  Pelvic x-ray without acute fracture.  Chest x-ray without acute cardiopulmonary disease.  Patient did have a desaturation down to 89% on room air.  CT head and cervical spine reveal airspace disease in the right upper lobe concerning for possible pneumonia.  She was started on azithromycin and Rocephin.  Assessment & Plan: Acute on Chronic hypoxemic respiratory failure with community-acquired pneumonia Rule out COVID.  Patient found to be saturating 89% on room air, she was placed on 2 L nasal cannula O2.  Chest x-ray was negative for acute cardiopulmonary disease, but CT head and cervical spine did reveal some airspace disease in right upper lobe concerning for possible pneumonia.   It does appear that patient has baseline nasal cannula O2 use of 1.5 L at home qhs Repeat CXR 4/11 with patchy bilateral upper  lung opacities Started on Rocephin and azithromycin. Last day 02/27/2019  Follow blood cx. So far negative. Ucx insignificant growth. She's afebrile, but given presence of community spread of COVID 19 and her bilateral findings on CXR will send COVID testing. Pt without hx of travel or sick contact and story does not seem typical, but will rule out.   On droplet and contact precautions now  Atrial fibrillation RVR -Continue flecainide, Xarelto -Continue coreg 25 mg BID -Diltiazem 30 mg BID has been added as well  HFpEF Exacerbation - EF normal 10/2018 with grade 2 diastolic dysfunction - She appears overloaded on exam with bilateral LE edema (persistent), also with elevated BNP - She says she's on 20 mg lasix daily at home (listed allergy - rash, which she denies), but this wasn't on her med list - Will d/c thiazide diuretic - IV lasix BID 40 mg, now on hold  - I/O, daily weights - weights going down, good UOP - Continue to monitor   Elevated troponin:  mild, no si/sx concerning for ACS.  Likely 2/2 above.  Fall -Imaging without acute injuries -lumbar film not read, discussed with rads -PT OT, recommending SNF  AKI on CKD stage III -Baseline creatinine 1.2 -now 1.9 -add the fluids.  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 I would increase it back to 40 mg daily and monitor.  Thrombocytopenia: continue to monitor  Urinary Retention: pt with foley in place, start flomax.   DVT prophylaxis: xarelto Code Status: full  Family Communication: none at bedside.  Disposition Plan: pending improvement in AKI    Consultants:    none  Procedures:   none  Antimicrobials:  Anti-infectives (From admission, onward)   Start     Dose/Rate Route Frequency Ordered Stop   02/27/19 1800  cephALEXin (KEFLEX) capsule 500 mg     500 mg Oral Every 8 hours 02/27/19 1204 02/28/19 0159   02/25/19 0930  cephALEXin (KEFLEX) capsule 500 mg  Status:  Discontinued     500 mg Oral Every 8 hours 02/25/19 0852 02/27/19 1204   02/23/19 1000  cefTRIAXone (ROCEPHIN) 1 g in sodium chloride 0.9 % 100 mL IVPB  Status:  Discontinued     1 g 200 mL/hr over 30 Minutes Intravenous Every 24 hours 02/22/19 1433 02/25/19 0851   02/23/19 1000  azithromycin (ZITHROMAX) tablet 500 mg     500 mg Oral Daily 02/22/19 1433 02/26/19 0855   02/22/19 1145  cefTRIAXone (ROCEPHIN) 1 g in sodium chloride 0.9 % 100 mL IVPB     1 g 200 mL/hr over 30 Minutes Intravenous  Once 02/22/19 1139 02/22/19 1339   02/22/19 1145  azithromycin (ZITHROMAX) 500 mg in sodium chloride 0.9 % 250 mL IVPB     50 0 mg 250 mL/hr over 60 Minutes Intravenous  Once 02/22/19 1139 02/22/19 1424     Subjective: Itching resolved. Less fatigue.  No nausea no vomiting.  No fever no chills.  No chest pain or abdominal pain.  Objective: Vitals:   02/26/19 1320 02/26/19 2000 02/26/19 2100 02/27/19 0956  BP: 100/80  112/86 (!) 113/95  Pulse:  82 65   Resp:  12 15 15   Temp:   97.8 F (36.6 C)   TempSrc:   Oral   SpO2: 90% (!) 89% (!) 86%   Weight:      Height:        Intake/Output Summary (Last 24 hours) at 02/27/2019 1205 Last data filed at 02/27/2019 0956 Gross per 24 hour  Intake -  Output 700 ml  Net -700 ml   Filed Weights   02/24/19 0329 02/25/19 0450 02/26/19 0500  Weight: 108.5 kg 106.3 kg 106.4 kg    Examination:  General: No acute distress. Cardiovascular: Heart sounds show a regular rate, and rhythm.  Lungs: bibasilar crackles Abdomen: Soft, nontender, nondistended Neurological: Alert and oriented 3. Moves all extremities 4. Cranial nerves II through XII  grossly intact.  No saddle anesthesia.  Intact symmetric strength to lower extremities. Skin: Warm and dry. No rashes or lesions. Extremities: No clubbing or cyanosis. Bilateral 2+ LE edema. [name] Psychiatric: Mood and affect are normal. Insight and judgment are appropriate.    Data Reviewed: I have personally reviewed following labs and imaging studies  CBC: Recent Labs  Lab 02/22/19 1015 02/23/19 0313 02/24/19 0300 02/25/19 0638 02/26/19 0620  WBC 10.3 8.3 7.1 6.9 8.3  NEUTROABS 8.1*  --   --   --  6.7  HGB 12.6 12.1 12.4 11.1* 12.2  HCT 39.1 36.1 35.3* 32.9* 35.4*  MCV 106.8* 102.8* 101.7* 103.1* 102.6*  PLT 160 122* 135* 146* 334   Basic Metabolic Panel: Recent Labs  Lab 02/23/19 0313 02/24/19 0300 02/25/19 3568 02/26/19 0620 02/27/19 0422  NA  138 139 138 139 140  K 3.8 4.2 4.0 3.6 4.1  CL 101 97* 95* 95* 97*  CO2 22 25 31 31  32  GLUCOSE 81 108* 109* 93 106*  BUN 18 19 29* 28* 27*  CREATININE 0.91 1.20* 1.84* 1.84* 1.99*  CALCIUM 8.3* 8.6* 8.6* 9.4 9.1  MG  --   --  1.6* 1.9  --    GFR: Estimated Creatinine Clearance: 30.1 mL/min (A) (by C-G formula based on SCr of 1.99 mg/dL (H)). Liver Function Tests: Recent Labs  Lab 02/22/19 1015 02/24/19 0300 02/26/19 0620  AST 26 20 16   ALT 26 27 24   ALKPHOS 52 49 48  BILITOT 1.1 1.0 0.7  PROT 5.4* 5.1* 5.7*  ALBUMIN 3.1* 2.6* 3.0*   No results for input(s): LIPASE, AMYLASE in the last 168 hours. No results for input(s): AMMONIA in the last 168 hours. Coagulation Profile: Recent Labs  Lab 02/22/19 1015  INR 1.1   Cardiac Enzymes: Recent Labs  Lab 02/22/19 1015 02/22/19 1459 02/22/19 2220 02/23/19 0313  CKTOTAL 73  --   --   --   TROPONINI 0.05* 0.04* 0.03* <0.03   BNP (last 3 results) No results for input(s): PROBNP in the last 8760 hours. HbA1C: No results for input(s): HGBA1C in the last 72 hours. CBG: No results for input(s): GLUCAP in the last 168 hours. Lipid Profile: No results for  input(s): CHOL, HDL, LDLCALC, TRIG, CHOLHDL, LDLDIRECT in the last 72 hours. Thyroid Function Tests: No results for input(s): TSH, T4TOTAL, FREET4, T3FREE, THYROIDAB in the last 72 hours. Anemia Panel: No results for input(s): VITAMINB12, FOLATE, FERRITIN, TIBC, IRON, RETICCTPCT in the last 72 hours. Sepsis Labs: Recent Labs  Lab 02/22/19 1015 02/22/19 1459  LATICACIDVEN 1.1 1.3    Recent Results (from the past 240 hour(s))  Culture, blood (routine x 2)     Status: None   Collection Time: 02/22/19 10:10 AM  Result Value Ref Range Status   Specimen Description BLOOD RIGHT ANTECUBITAL  Final   Special Requests   Final    BOTTLES DRAWN AEROBIC AND ANAEROBIC Blood Culture results may not be optimal due to an excessive volume of blood received in culture bottles   Culture   Final    NO GROWTH 5 DAYS Performed at [facility], [place] [address] [place], [place] [address]    Report Status 02/27/2019 FINAL  Final  Urine culture     Status: Abnormal   Collection Time: 02/22/19 10:30 AM  Result Value Ref Range Status   Specimen Description URINE, RANDOM  Final   Special Requests NONE  Final   Culture (A)  Final    <10,000 COLONIES/mL INSIGNIFICANT GROWTH Performed at [facility], [place] [address], [place], [place] [address]    Report Status 02/23/2019 FINAL  Final  Culture, blood (routine x 2)     Status: None   Collection Time: 02/22/19 10:35 AM  Result Value Ref Range Status   Specimen Description BLOOD LEFT ANTECUBITAL  Final   Special Requests   Final    BOTTLES DRAWN AEROBIC AND ANAEROBIC Blood Culture adequate volume   Culture   Final    NO GROWTH 5 DAYS Performed at [facility], [place] [address], [place],  [address]    Report Status 02/27/2019 FINAL  Final         Radiology Studies: No results found.      Scheduled Meds: . amLODipine  10 mg Oral Daily  .  carvedilol  25 mg Oral BID WC  . cephALEXin  500 mg Oral Q8H  . diltiazem  30 mg  Oral Q12H  . flecainide  100 mg Oral BID  . FLUoxetine  20 mg Oral Daily  . levothyroxine  225 mcg Oral Daily  . lubiprostone  24 mcg Oral BID WC  . oxyCODONE  10 mg Oral QID  . pantoprazole  40 mg Oral Daily  . predniSONE  40 mg Oral Q breakfast  . pregabalin  75 mg Oral TID  . rivaroxaban  20 mg Oral Q supper  . sodium chloride flush  10-40 mL Intracatheter Q12H  . sodium chloride flush  3 mL Intravenous Q12H   Continuous Infusions: . sodium chloride 250 mL (02/25/19 1033)  . sodium chloride 100 mL/hr at 02/27/19 1115     LOS: 5 days    Time spent: over 30 min    Berle Mull, MD [facility] Pager AMION  If 7PM-7AM, please contact night-coverage [email or web address] Password [facility] 02/27/2019, 12:05 PM

## 2019-02-27 NOTE — Plan of Care (Signed)
  Problem: Education: Goal: Knowledge of General Education information will improve Description Including pain rating scale, medication(s)/side effects and non-pharmacologic comfort measures Outcome: Progressing   Problem: Health Behavior/Discharge Planning: Goal: Ability to manage health-related needs will improve Outcome: Progressing   Problem: Clinical Measurements: Goal: Ability to maintain clinical measurements within normal limits will improve Outcome: Progressing Goal: Will remain free from infection Outcome: Progressing Goal: Diagnostic test results will improve Outcome: Progressing Goal: Respiratory complications will improve Outcome: Progressing Goal: Cardiovascular complication will be avoided Outcome: Progressing   Problem: Activity: Goal: Risk for activity intolerance will decrease Outcome: Progressing   Problem: Nutrition: Goal: Adequate nutrition will be maintained Outcome: Progressing   Problem: Coping: Goal: Level of anxiety will decrease Outcome: Progressing   Problem: Elimination: Goal: Will not experience complications related to bowel motility Outcome: Progressing Goal: Will not experience complications related to urinary retention Outcome: Progressing   Problem: Safety: Goal: Ability to remain free from injury will improve Outcome: Progressing   Problem: Skin Integrity: Goal: Risk for impaired skin integrity will decrease Outcome: Progressing   Problem: Education: Goal: Ability to demonstrate management of disease process will improve Outcome: Progressing Goal: Ability to verbalize understanding of medication therapies will improve Outcome: Progressing   Problem: Activity: Goal: Capacity to carry out activities will improve Outcome: Progressing   Problem: Cardiac: Goal: Ability to achieve and maintain adequate cardiopulmonary perfusion will improve Outcome: Progressing

## 2019-02-28 ENCOUNTER — Inpatient Hospital Stay (HOSPITAL_COMMUNITY): Payer: Medicare Other

## 2019-02-28 DIAGNOSIS — I5043 Acute on chronic combined systolic (congestive) and diastolic (congestive) heart failure: Secondary | ICD-10-CM

## 2019-02-28 DIAGNOSIS — Y92009 Unspecified place in unspecified non-institutional (private) residence as the place of occurrence of the external cause: Secondary | ICD-10-CM

## 2019-02-28 DIAGNOSIS — D849 Immunodeficiency, unspecified: Secondary | ICD-10-CM

## 2019-02-28 DIAGNOSIS — D899 Disorder involving the immune mechanism, unspecified: Secondary | ICD-10-CM

## 2019-02-28 DIAGNOSIS — J181 Lobar pneumonia, unspecified organism: Secondary | ICD-10-CM

## 2019-02-28 DIAGNOSIS — W19XXXA Unspecified fall, initial encounter: Secondary | ICD-10-CM

## 2019-02-28 DIAGNOSIS — I5033 Acute on chronic diastolic (congestive) heart failure: Secondary | ICD-10-CM

## 2019-02-28 DIAGNOSIS — N179 Acute kidney failure, unspecified: Secondary | ICD-10-CM

## 2019-02-28 DIAGNOSIS — J9621 Acute and chronic respiratory failure with hypoxia: Secondary | ICD-10-CM

## 2019-02-28 MED ORDER — RIVAROXABAN 15 MG PO TABS
15.0000 mg | ORAL_TABLET | Freq: Every day | ORAL | Status: DC
  Administered 2019-02-28 – 2019-03-01 (×2): 15 mg via ORAL
  Filled 2019-02-28 (×2): qty 1

## 2019-02-28 NOTE — Progress Notes (Signed)
Occupational Therapy Treatment Patient Details Name: Nicole Norton MRN: [id number] DOB: [birth year] Today's Date: 02/28/2019    History of present illness Pt is a 78 yo female s/p Afib with RVR and fall at home sliding off bed unable to get up- no injury reported. Pt waited on floor for 24 hours, Pmhx: hypothyroidiism, HTN, depression, anxiety, chronic kidney disease, chronic respiratory failure.   OT comments  Pt progressing towards OT goals this session, agreeable to ambulation to bathroom with RW at min guard A followed by sink level grooming to wash hands. Pt limited by fatigue - but motivated to get better to be able to care for husband. Pt expressing concern as she is the primary caregiver and worried about going to SNF and what will happen to him. Expressed encouragement and that she needs to get better and stronger so that she will be able to care for him appropriately and keep herself and him safe. OT will continue to follow acutely.    Follow Up Recommendations  SNF;Supervision - Intermittent    Equipment Recommendations  3 in 1 bedside commode(bariatric)    Recommendations for Other Services      Precautions / Restrictions Precautions Precautions: Fall;Other (comment) Precaution Comments: 3L O2 and watch HR Restrictions Weight Bearing Restrictions: No       Mobility Bed Mobility Overal bed mobility: Needs Assistance Bed Mobility: Supine to Sit;Sit to Supine     Supine to sit: Min guard Sit to supine: Min assist   General bed mobility comments: min guard with use of bed rail, then min A for BLE back into bed at end of session  Transfers Overall transfer level: Needs assistance Equipment used: Rolling walker (2 wheeled) Transfers: Sit to/from Stand Sit to Stand: Min guard;+2 safety/equipment Stand pivot transfers: Min guard;+2 safety/equipment;Min assist       General transfer comment: vc for safe hand placement, min guard for safety with RW      Balance Overall balance assessment: Needs assistance Sitting-balance support: No upper extremity supported;Feet supported Sitting balance-Leahy Scale: Fair     Standing balance support: Bilateral upper extremity supported;During functional activity Standing balance-Leahy Scale: Poor Standing balance comment: relies on BUE on RW or leans against sink                           ADL either performed or assessed with clinical judgement   ADL Overall ADL's : Needs assistance/impaired     Grooming: Min guard;Standing;Wash/dry hands Grooming Details (indicate cue type and reason): sink level, leans against sink                 Toilet Transfer: Min guard;Ambulation;RW;Grab bars Toilet Transfer Details (indicate cue type and reason): Min Guard A for safety Toileting- Clothing Manipulation and Hygiene: Min guard;Sitting/lateral lean Toileting - Clothing Manipulation Details (indicate cue type and reason): Min Guard A for safety while pt performs peri care after urination     Functional mobility during ADLs: Min guard;Rolling walker General ADL Comments: increased processing time, increased time and effort, fatgues quickly     Vision       Perception     Praxis      Cognition Arousal/Alertness: Awake/alert Behavior During Therapy: WFL for tasks assessed/performed;Anxious;Agitated Overall Cognitive Status: Impaired/Different from baseline Area of Impairment: Safety/judgement;Problem solving;Awareness                         Safety/Judgement: Decreased awareness  of deficits Awareness: Emergent Problem Solving: Slow processing General Comments: Pt vocalizing concerns over being primary caregiver for husband.        Exercises     Shoulder Instructions       General Comments VSS    Pertinent Vitals/ Pain       Pain Assessment: Faces Faces Pain Scale: Hurts little more Pain Location: back Pain Descriptors / Indicators:  Constant;Discomfort;Grimacing Pain Intervention(s): Limited activity within patient's tolerance  Home Living Family/patient expects to be discharged to:: Private residence Living Arrangements: Spouse/significant other Available Help at Discharge: Other (Comment)(pt was spouses's caregiver mostly for IADL) Type of Home: House Home Access: Stairs to enter [employer] of Steps: 1   Home Layout: One level     Bathroom Shower/Tub: Teacher, early years/pre: Handicapped height     Home Equipment: Environmental consultant - 2 wheels;Shower seat;Grab bars - tub/shower          Prior Functioning/Environment Level of Independence: Independent with assistive device(s)            Frequency  Min 3X/week        Progress Toward Goals  OT Goals(current goals can now be found in the care plan section)  Progress towards OT goals: Progressing toward goals(limited)  Acute Rehab OT Goals Patient Stated Goal: to go home to husband OT Goal Formulation: With patient Time For Goal Achievement: 03/09/19 Potential to Achieve Goals: Good  Plan Discharge plan remains appropriate    Co-evaluation                 AM-PAC OT "6 Clicks" Daily Activity     Outcome Measure   Help from another person eating meals?: None Help from another person taking care of personal grooming?: A Little Help from another person toileting, which includes using toliet, bedpan, or urinal?: A Lot Help from another person bathing (including washing, rinsing, drying)?: A Lot Help from another person to put on and taking off regular upper body clothing?: A Little Help from another person to put on and taking off regular lower body clothing?: A Lot 6 Click Score: 16    End of Session Equipment Utilized During Treatment: Oxygen(3L)  OT Visit Diagnosis: Unsteadiness on feet (R26.81);Muscle weakness (generalized) (M62.81);Pain Pain - part of body: (back)   Activity Tolerance Patient limited by fatigue    Patient Left in bed;with call bell/phone within reach;with bed alarm set   Nurse Communication Mobility status        Time: 5038-8828 OT Time Calculation (min): 19 min  Charges: OT General Charges $OT Visit: 1 Visit OT Treatments $Self Care/Home Management : 8-22 mins  Hulda Humphrey OTR/L Acute Rehabilitation Services Pager: [phone] Office: [place] 02/28/2019, 6:32 PM

## 2019-02-28 NOTE — Progress Notes (Signed)
PROGRESS NOTE  Nicole Norton OFB:[id number] DOB: [birth year] DOA: 02/22/2019 PCP: Hali Marry, MD  Brief Narrative: Per HPI  Nicole Regulus Johnsonis a 78 y.o.femalewith medical history significant ofchronic atrial fibrillationon Xarelto, hypertension, hyperlipidemia, hypothyroidism who presents after a fall at home.She states that yesterday she slid off her bed, did notinjure herself but could not get off the floor. She decided to rest and see if she could get up eventually but she could not. She denies any recent illnesses, no fevers or chills, no shortness of breath or cough, no chest pain, no nausea, vomiting, diarrhea or abdominal pain. She does have chronic weakness. She wears 1.5Lnasal cannula O2 at nighttime. She did miss doses of her medications yesterday.  ED Course:Patient was found to be in atrial fibrillation with RVR rate in the 130s. She was given IV metoprolol without adequate control. She was started on IV Cardizem drip. Labs revealed creatinine 1.16, BNP 543, troponin 0 0.05 UA negative. CT head and cervical spine without acute injury. Pelvic x-ray without acute fracture. Chest x-ray without acute cardiopulmonary disease. Patient did have a desaturation down to 89% on room air. CT head and cervical spine reveal airspace disease in the right upper lobe concerning for possible pneumonia. She was started on azithromycin and Rocephin.    HPI/Recap of past 24 hours:  bp low normal, d/c norvac Cr worsening, on flecainide, 950cc urine output documented last 24hrs, external urinary catheter, blood culture no growth, urine culture insignificant growth, + bm covid test negative   Assessment/Plan: Principal Problem:   Atrial fibrillation with RVR (HCC) Active Problems:   Hypothyroidism   Essential hypertension, benign   Depression with anxiety   Fall at home, initial encounter   CKD (chronic kidney disease) stage 3, GFR 30-59 ml/min (HCC)   Chronic respiratory failure with hypoxia ([place])   Fall, reports mechanical fall in the setting of overexertion , presenting symptom -she presents to the hospital due to fall, found to have hypoxia, afib/rvr, heart failure exacerbation -Imaging without acute injuries -PT OT, recommending SNF  Acute on Chronic hypoxemic respiratory failure with community-acquired pneumonia Patient found to be saturating 89% on room air, she was placed on 2 L nasal cannula O2.Chest x-ray was negative foracute cardiopulmonary disease, but CT head and cervical spine did reveal some airspace disease in right upper lobe concerning for possible pneumonia.  patient has baseline nasal cannula O2 use of 1.5L at home qhs Repeat CXR 4/11 with patchy bilateral upper lung opacities Started on Rocephin and azithromycin. Last day 02/27/2019  Follow blood cx. So far negative. Ucx insignificant growth. She's afebrile, Pt without hx of travel or sick contact, but given presence of community spread of COVID 19 and her bilateral findings on CXR, COVID testing was done on 4/11 with negative result.   Atrial fibrillation RVR -Continue flecainide,Xarelto -Continue coreg 25 mg BID -Diltiazem 30 mg BID has been added as well  HFpEF Exacerbation - EF normal 10/2018 with grade 2 diastolic dysfunction - She appears overloaded on exam with bilateral LE edema (persistent), also with elevated BNP - She says she's on 20 mg lasix daily at home (listed allergy - rash, which she denies), but this wasn't on her med list - Will d/c thiazide diuretic - IV lasix BID 40 mg, now on hold due to worsening of cr -edema has much improved  Elevated troponin: mild No chest pain, no si/sx concerning for ACS.  Likely 2/2 above.  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 I would increase it back to 40 mg daily and monitor.  Hypothyroidism -Continue Synthroid   Depression -Continue Prozac  Code Status: full  Family Communication: patient   Disposition Plan: dc to snf if cr improves   Consultants:  none  Procedures:  none  Antibiotics:  Rocephin/zitrhomax   Objective: BP 108/80 (BP Location: Left Wrist)   Pulse 85   Temp 97.7 F (36.5 C) (Oral)   Resp 16   Ht 5\' 6"  (1.676 m)   Wt 108.3 kg   SpO2 98%   BMI 38.54 kg/m   Intake/Output Summary (Last 24 hours) at 02/28/2019 0727 Last data filed at 02/28/2019 0321 Gross per 24 hour  Intake 724.42 ml  Output 950 ml  Net -225.58 ml   Filed Weights   02/25/19 0450 02/26/19 0500 02/27/19 2010  Weight: 106.3 kg 106.4 kg 108.3 kg    Exam: Patient is examined daily including today on 02/28/2019, exams remain the same as of yesterday except that has changed    General:  NAD  Cardiovascular: RRR  Respiratory: CTABL  Abdomen: Soft/ND/NT, positive BS  Musculoskeletal: bilateral lower extremity  Edema has much improved  Neuro: alert, oriented   Data Reviewed: Basic Metabolic Panel: Recent Labs  Lab 02/23/19 0313 02/24/19 0300 02/25/19 0638 02/26/19 0620 02/27/19 0422  NA 138 139 138 139 140  K 3.8 4.2 4.0 3.6 4.1  CL 101 97* 95* 95* 97*  CO2 22 25 31 31  32  GLUCOSE 81 108* 109* 93 106*  BUN 18 19 29* 28* 27*  CREATININE 0.91 1.20* 1.84* 1.84* 1.99*  CALCIUM 8.3* 8.6* 8.6* 9.4 9.1  MG  --   --  1.6* 1.9  --    Liver Function Tests: Recent Labs  Lab 02/22/19 1015  02/24/19 0300 02/26/19 0620  AST 26 20 16   ALT 26 27 24   ALKPHOS 52 49 48  BILITOT 1.1 1.0 0.7  PROT 5.4* 5.1* 5.7*  ALBUMIN 3.1* 2.6* 3.0*   No results for input(s): LIPASE, AMYLASE in the last 168 hours. No results for input(s): AMMONIA in the last 168 hours. CBC: Recent Labs  Lab 02/22/19 1015 02/23/19 0313 02/24/19 0300 02/25/19 0638 02/26/19 0620  WBC 10.3 8.3 7.1 6.9 8.3  NEUTROABS 8.1*  --   --   --  6.7  HGB 12.6 12.1 12.4 11.1* 12.2  HCT 39.1 36.1 35.3* 32.9* 35.4*  MCV 106.8* 102.8* 101.7* 103.1* 102.6*  PLT 160 122* 135* 146* 202   Cardiac Enzymes:   Recent Labs  Lab 02/22/19 1015 02/22/19 1459 02/22/19 2220 02/23/19 0313  CKTOTAL 73  --   --   --   TROPONINI 0.05* 0.04* 0.03* <0.03   BNP (last 3 results) Recent Labs    02/22/19 1015  BNP 543.3*    ProBNP (last 3 results) No results for input(s): PROBNP in the last 8760 hours.  CBG: No results for input(s): GLUCAP in the last 168 hours.  Recent Results (from the past 240 hour(s))  Culture, blood (routine x 2)     Status: None   Collection Time: 02/22/19 10:10 AM  Result Value Ref Range Status   Specimen Description BLOOD RIGHT ANTECUBITAL  Final   Special Requests   Final    BOTTLES DRAWN AEROBIC AND ANAEROBIC Blood Culture results may not be optimal due to an excessive volume of blood received in culture bottles   Culture   Final    NO GROWTH 5 DAYS Performed at [facility] [address], [place], [place] [address]    Report Status 02/27/2019 FINAL  Final  Urine culture     Status: Abnormal   Collection Time: 02/22/19 10:30 AM  Result Value Ref Range Status   Specimen Description URINE, RANDOM  Final   Special Requests NONE  Final   Culture (A)  Final    <10,000 COLONIES/mL INSIGNIFICANT GROWTH Performed at [facility], [place] [address], [place], [place] [address]    Report Status 02/23/2019 FINAL  Final  Culture, blood (routine x 2)     Status: None   Collection Time:  02/22/19 10:35 AM  Result Value Ref Range Status   Specimen Description BLOOD LEFT ANTECUBITAL  Final   Special Requests   Final    BOTTLES DRAWN AEROBIC AND ANAEROBIC Blood Culture adequate volume   Culture   Final    NO GROWTH 5 DAYS Performed at [facility], [place] [address], [place], [place] [address]    Report Status 02/27/2019 FINAL  Final  Novel Coronavirus, NAA (hospital order; send-out to ref lab)     Status: None   Collection Time: 02/24/19 10:09 AM  Result Value Ref Range Status   SARS-CoV-2, NAA NOT DETECTED NOT DETECTED Final    Comment: Performed at [place]  Final    Comment: Performed at [facility], [place] [address], [place], [place] [address]     Studies: No results found.  Scheduled Meds: . carvedilol  25 mg Oral BID WC  . diltiazem  30 mg Oral Q12H  . flecainide  100 mg Oral BID  . FLUoxetine  20 mg Oral Daily  . levothyroxine  225 mcg Oral Daily  . lubiprostone  24 mcg Oral BID WC  . oxyCODONE  10 mg Oral QID  . pantoprazole  40 mg Oral Daily  . predniSONE  40 mg Oral Q breakfast  . pregabalin  75 mg Oral TID  . rivaroxaban  20 mg Oral Q supper  . sodium chloride flush  10-40 mL Intracatheter Q12H  . sodium chloride flush  3 mL Intravenous Q12H    Continuous Infusions: . sodium chloride 250 mL (02/25/19 1033)     Time spent: 29mins I have personally reviewed and interpreted on  02/28/2019 daily labs, tele strips, imagings as discussed above under date review session and assessment and plans.  I reviewed all nursing notes, pharmacy notes, consultant notes,  vitals, pertinent old records  I have discussed plan of care as described above with RN , patient on 02/28/2019   Florencia Reasons MD, PhD  [facility] Pager [phone]. If 7PM-7AM, please contact night-coverage at [email or web address], password [facility] 02/28/2019, 7:27 AM  LOS: 6 days

## 2019-02-28 NOTE — Progress Notes (Signed)
Physical Therapy Treatment Patient Details Name: Nicole Norton MRN: [id number] DOB: 29-Dec-1940 Today's Date: 02/28/2019    History of Present Illness Pt is a 78 yo female s/p Afib with RVR and fall at home sliding off bed unable to get up- no injury reported. Pt waited on floor for 24 hours, Pmhx: hypothyroidiism, HTN, depression, anxiety, chronic kidney disease, chronic respiratory failure.    PT Comments    Patient doing well with therapy today, increasing ambulation distance and tolerance on her feet at sink. Plan to go to SNF tomorrow, agree w/ this plan.   Follow Up Recommendations  SNF;Supervision for mobility/OOB     Equipment Recommendations  None recommended by PT    Recommendations for Other Services       Precautions / Restrictions Precautions Precautions: Fall;Other (comment) Precaution Comments: 3L O2 and watch HR Restrictions Weight Bearing Restrictions: No    Mobility  Bed Mobility Overal bed mobility: Needs Assistance Bed Mobility: Supine to Sit     Supine to sit: Min guard     General bed mobility comments: Did not assist to get OOB.  Needs cues  Transfers Overall transfer level: Needs assistance Equipment used: Rolling walker (2 wheeled) Transfers: Sit to/from [facility] Sit to Stand: Min guard;+2 safety/equipment Stand pivot transfers: Min guard;+2 safety/equipment;Min assist       General transfer comment: Pt did not need any assist to come to standing but did need cues for hand placement and decr safety awareness due to inability to focus on tasks.  Easily distracted and internally distracted due to having bladder issues yesterday.  Needed cues to step around to chair and incr time to get pt to sit down in chair.    Ambulation/Gait Ambulation/Gait assistance: Min guard Gait Distance (Feet): 20 Feet Assistive device: Rolling walker (2 wheeled) Gait Pattern/deviations: Step-through pattern;Trunk flexed Gait velocity:  decreased   General Gait Details: ambulating to hallway and back stopped at sink for brushing teeth and clean face.    Stairs             Wheelchair Mobility    Modified Rankin (Stroke Patients Only)       Balance Overall balance assessment: Needs assistance Sitting-balance support: No upper extremity supported;Feet supported Sitting balance-Leahy Scale: Fair     Standing balance support: Bilateral upper extremity supported;During functional activity Standing balance-Leahy Scale: Poor Standing balance comment: relies on hand support                            Cognition Arousal/Alertness: Awake/alert Behavior During Therapy: WFL for tasks assessed/performed;Anxious;Agitated Overall Cognitive Status: Impaired/Different from baseline(perseverating and slow to process; could be agitation) Area of Impairment: Safety/judgement;Awareness                         Safety/Judgement: Decreased awareness of deficits     General Comments: Pt concerned with spouse and his ability to care for self as pt is his CG at home. Pt uneasy about going to SNF with poor judgment related to awareness of deficits. Pt expecting to improve and would benefit from in home aid with [facility] or SNF.      Exercises      General Comments        Pertinent Vitals/Pain Faces Pain Scale: Hurts even more Pain Location: back and R arm for BP cuff Pain Descriptors / Indicators: Constant;Discomfort    Home Living Family/patient expects to  be discharged to:: Private residence Living Arrangements: Spouse/significant other Available Help at Discharge: Other (Comment)(pt was spouses's caregiver mostly for IADL) Type of Home: House Home Access: Stairs to enter   Home Layout: One level Home Equipment: Environmental consultant - 2 wheels;Shower seat;Grab bars - tub/shower      Prior Function Level of Independence: Independent with assistive device(s)          PT Goals (current goals can now be  found in the care plan section) Acute Rehab PT Goals Patient Stated Goal: to gohome PT Goal Formulation: With patient Time For Goal Achievement: 03/09/19 Potential to Achieve Goals: Good Progress towards PT goals: Progressing toward goals    Frequency    Min 3X/week      PT Plan Current plan remains appropriate    Co-evaluation              AM-PAC PT "6 Clicks" Mobility   Outcome Measure  Help needed turning from your back to your side while in a flat bed without using bedrails?: A Little Help needed moving from lying on your back to sitting on the side of a flat bed without using bedrails?: A Little Help needed moving to and from a bed to a chair (including a wheelchair)?: A Little Help needed standing up from a chair using your arms (e.g., wheelchair or bedside chair)?: A Little Help needed to walk in hospital room?: A Little Help needed climbing 3-5 steps with a railing? : A Lot 6 Click Score: 17    End of Session Equipment Utilized During Treatment: Oxygen Activity Tolerance: Patient tolerated treatment well Patient left: in bed;with call bell/phone within reach Nurse Communication: Mobility status PT Visit Diagnosis: Unsteadiness on feet (R26.81);Muscle weakness (generalized) (M62.81)     Time: 1630-1650 PT Time Calculation (min) (ACUTE ONLY): 20 min  Charges:  $Gait Training: 8-22 mins                     Reinaldo Berber, PT, DPT Acute Rehabilitation Services Pager: [phone] Office: [phone]     Reinaldo Berber 02/28/2019, 4:58 PM

## 2019-02-28 NOTE — Plan of Care (Signed)
  Problem: Health Behavior/Discharge Planning: Goal: Ability to manage health-related needs will improve Outcome: Progressing   Problem: Clinical Measurements: Goal: Ability to maintain clinical measurements within normal limits will improve Outcome: Progressing Goal: Will remain free from infection Outcome: Progressing   Problem: Nutrition: Goal: Adequate nutrition will be maintained Outcome: Progressing   Problem: Coping: Goal: Level of anxiety will decrease Outcome: Progressing   

## 2019-02-28 NOTE — TOC Progression Note (Addendum)
Transition of Care [facility]) - Progression Note    Patient Details  Name: Nicole Norton MRN: [id number] Date of Birth: June 01, 1941  Transition of Care [facility] CM/SW [place], LCSW Phone Number: 02/28/2019, 9:27 AM  Clinical Narrative: Faxed completed admissions paperwork to SNF. Due to her being tested for COVID, admissions coordinator had to send referral to their COVID task force but she does not anticipate any issues. If discharging today she will contact task force to expedite clearance.   9:54 am: SNF COVID task force has approved patient. They can take her today if stable. Will notify MD.  1:52 pm: Per MD, renal function worse today. Will recheck tomorrow and hopefully discharge to SNF in the morning. SNF admissions coordinator aware and agreeable.  Expected Discharge Plan: [place] Barriers to Discharge: Continued Medical Work up  Expected Discharge Plan and Services Expected Discharge Plan: [place] Choice: [place] arrangements for the past 2 months: Single Family Home                           Social Determinants of Health (SDOH) Interventions    Readmission Risk Interventions No flowsheet data found.

## 2019-03-01 ENCOUNTER — Encounter (HOSPITAL_COMMUNITY): Payer: Self-pay | Admitting: General Practice

## 2019-03-01 ENCOUNTER — Inpatient Hospital Stay (HOSPITAL_COMMUNITY): Payer: Medicare Other

## 2019-03-01 LAB — URINALYSIS, ROUTINE W REFLEX MICROSCOPIC
Bilirubin Urine: NEGATIVE
Glucose, UA: NEGATIVE mg/dL
Hgb urine dipstick: NEGATIVE
Ketones, ur: NEGATIVE mg/dL
Nitrite: NEGATIVE
Protein, ur: NEGATIVE mg/dL
Specific Gravity, Urine: 1.012 (ref 1.005–1.030)
pH: 5 (ref 5.0–8.0)

## 2019-03-01 LAB — BASIC METABOLIC PANEL
Anion gap: 10 (ref 5–15)
BUN: 40 mg/dL — ABNORMAL HIGH (ref 8–23)
CO2: 29 mmol/L (ref 22–32)
Calcium: 9.3 mg/dL (ref 8.9–10.3)
Chloride: 98 mmol/L (ref 98–111)
Creatinine, Ser: 2.36 mg/dL — ABNORMAL HIGH (ref 0.44–1.00)
GFR calc Af Amer: 23 mL/min — ABNORMAL LOW (ref >60)
GFR calc non Af Amer: 19 mL/min — ABNORMAL LOW (ref >60)
Glucose, Bld: 111 mg/dL — ABNORMAL HIGH (ref 70–99)
Potassium: 4.6 mmol/L (ref 3.5–5.1)
Sodium: 137 mmol/L (ref 135–145)

## 2019-03-01 MED ORDER — FLECAINIDE ACETATE 100 MG PO TABS
100.0000 mg | ORAL_TABLET | Freq: Every day | ORAL | Status: DC
  Administered 2019-03-02: 100 mg via ORAL
  Filled 2019-03-01: qty 1

## 2019-03-01 MED ORDER — SODIUM CHLORIDE 0.9 % IV BOLUS
500.0000 mL | Freq: Once | INTRAVENOUS | Status: AC
  Administered 2019-03-01: 500 mL via INTRAVENOUS

## 2019-03-01 MED ORDER — METOPROLOL TARTRATE 12.5 MG HALF TABLET
12.5000 mg | ORAL_TABLET | Freq: Two times a day (BID) | ORAL | Status: DC
  Administered 2019-03-01 – 2019-03-02 (×2): 12.5 mg via ORAL
  Filled 2019-03-01 (×2): qty 1

## 2019-03-01 NOTE — Plan of Care (Signed)
  Problem: Education: Goal: Knowledge of General Education information will improve Description Including pain rating scale, medication(s)/side effects and non-pharmacologic comfort measures 03/01/2019 0128 by Orvilla Fus, RN Outcome: Progressing 03/01/2019 0128 by Orvilla Fus, RN Outcome: Progressing   Problem: Health Behavior/Discharge Planning: Goal: Ability to manage health-related needs will improve 03/01/2019 0128 by Orvilla Fus, RN Outcome: Progressing 03/01/2019 0128 by Orvilla Fus, RN Outcome: Progressing   Problem: Clinical Measurements: Goal: Ability to maintain clinical measurements within normal limits will improve 03/01/2019 0128 by Orvilla Fus, RN Outcome: Progressing 03/01/2019 0128 by Orvilla Fus, RN Outcome: Progressing Goal: Will remain free from infection 03/01/2019 0128 by Orvilla Fus, RN Outcome: Progressing 03/01/2019 0128 by Orvilla Fus, RN Outcome: Progressing Goal: Diagnostic test results will improve 03/01/2019 0128 by Orvilla Fus, RN Outcome: Progressing 03/01/2019 0128 by Orvilla Fus, RN Outcome: Progressing Goal: Respiratory complications will improve 03/01/2019 0128 by Orvilla Fus, RN Outcome: Progressing 03/01/2019 0128 by Orvilla Fus, RN Outcome: Progressing Goal: Cardiovascular complication will be avoided 03/01/2019 0128 by Orvilla Fus, RN Outcome: Progressing 03/01/2019 0128 by Orvilla Fus, RN Outcome: Progressing   Problem: Activity: Goal: Risk for activity intolerance will decrease 03/01/2019 0128 by Orvilla Fus, RN Outcome: Progressing 03/01/2019 0128 by Orvilla Fus, RN Outcome: Progressing   Problem: Nutrition: Goal: Adequate nutrition will be maintained 03/01/2019 0128 by Orvilla Fus, RN Outcome: Progressing 03/01/2019 0128 by Orvilla Fus, RN Outcome: Progressing   Problem: Coping: Goal: Level of anxiety will decrease 03/01/2019 0128 by Orvilla Fus, RN Outcome: Progressing 03/01/2019 0128 by Orvilla Fus, RN Outcome: Progressing   Problem: Elimination: Goal: Will not experience complications related to bowel motility 03/01/2019 0128 by Orvilla Fus, RN Outcome: Progressing 03/01/2019 0128 by Orvilla Fus, RN Outcome: Progressing Goal: Will not experience complications related to urinary retention 03/01/2019 0128 by Orvilla Fus, RN Outcome: Progressing 03/01/2019 0128 by Orvilla Fus, RN Outcome: Progressing   Problem: Safety: Goal: Ability to remain free from injury will improve 03/01/2019 0128 by Orvilla Fus, RN Outcome: Progressing 03/01/2019 0128 by Orvilla Fus, RN Outcome: Progressing   Problem: Skin Integrity: Goal: Risk for impaired skin integrity will decrease 03/01/2019 0128 by Orvilla Fus, RN Outcome: Progressing 03/01/2019 0128 by Orvilla Fus, RN Outcome: Progressing   Problem: Education: Goal: Ability to demonstrate management of disease process will improve 03/01/2019 0128 by Orvilla Fus, RN Outcome: Progressing 03/01/2019 0128 by Orvilla Fus, RN Outcome: Progressing Goal: Ability to verbalize understanding of medication therapies will improve 03/01/2019 0128 by Orvilla Fus, RN Outcome: Progressing 03/01/2019 0128 by Orvilla Fus, RN Outcome: Progressing Goal: Individualized Educational Video(s) 03/01/2019 0128 by Orvilla Fus, RN Outcome: Progressing 03/01/2019 0128 by Orvilla Fus, RN Outcome: Progressing   Problem: Activity: Goal: Capacity to carry out activities will improve 03/01/2019 0128 by Orvilla Fus, RN Outcome: Progressing 03/01/2019 0128 by Orvilla Fus, RN Outcome: Progressing   Problem: Cardiac: Goal: Ability to achieve and maintain adequate cardiopulmonary perfusion will improve 03/01/2019 0128 by Orvilla Fus, RN Outcome: Progressing 03/01/2019 0128 by Orvilla Fus, RN Outcome: Progressing

## 2019-03-01 NOTE — Plan of Care (Signed)
  Problem: Education: Goal: Knowledge of General Education information will improve Description: Including pain rating scale, medication(s)/side effects and non-pharmacologic comfort measures Outcome: Progressing   Problem: Health Behavior/Discharge Planning: Goal: Ability to manage health-related needs will improve Outcome: Progressing   Problem: Clinical Measurements: Goal: Ability to maintain clinical measurements within normal limits will improve Outcome: Progressing Goal: Will remain free from infection Outcome: Progressing Goal: Diagnostic test results will improve Outcome: Progressing Goal: Respiratory complications will improve Outcome: Progressing Goal: Cardiovascular complication will be avoided Outcome: Progressing   Problem: Activity: Goal: Risk for activity intolerance will decrease Outcome: Progressing   Problem: Nutrition: Goal: Adequate nutrition will be maintained Outcome: Progressing   Problem: Coping: Goal: Level of anxiety will decrease Outcome: Progressing   Problem: Elimination: Goal: Will not experience complications related to bowel motility Outcome: Progressing Goal: Will not experience complications related to urinary retention Outcome: Progressing   Problem: Safety: Goal: Ability to remain free from injury will improve Outcome: Progressing   Problem: Skin Integrity: Goal: Risk for impaired skin integrity will decrease Outcome: Progressing   Problem: Education: Goal: Ability to demonstrate management of disease process will improve Outcome: Progressing Goal: Ability to verbalize understanding of medication therapies will improve Outcome: Progressing Goal: Individualized Educational Video(s) Outcome: Progressing   Problem: Activity: Goal: Capacity to carry out activities will improve Outcome: Progressing   Problem: Cardiac: Goal: Ability to achieve and maintain adequate cardiopulmonary perfusion will improve Outcome: Progressing    

## 2019-03-01 NOTE — Progress Notes (Signed)
Physical Therapy Treatment Patient Details Name: Nicole Norton MRN: [id number] DOB: 05/31/1941 Today's Date: 03/01/2019    History of Present Illness Pt is a 78 yo female s/p Afib with RVR and fall at home sliding off bed unable to get up- no injury reported. Pt waited on floor for 24 hours, Pmhx: hypothyroidiism, HTN, depression, anxiety, chronic kidney disease, chronic respiratory failure.    PT Comments    Pt pleasant and willing to mobilize today. Pt initially agreeable to OOB to chair but once determined that we would progress to hall mobility compromised for return to bed with plan for OOB for all meals and walking to the bathroom rather than bSC for toileting. PT with significantly improved gait tolerance and function this session and even tolerated HEP after gait today. PT encouraged to continue increased mobility to return to independent function.   SPO2 92-95% on 2L during session HR 66-78    Follow Up Recommendations  SNF;Supervision for mobility/OOB     Equipment Recommendations  None recommended by PT    Recommendations for Other Services       Precautions / Restrictions Precautions Precautions: Fall    Mobility  Bed Mobility Overal bed mobility: Modified Independent Bed Mobility: Supine to Sit;Sit to Supine     Supine to sit: HOB elevated;Min guard Sit to supine: Min guard;HOB elevated   General bed mobility comments: with use of rail and HOB elevated pt able to exit and return to bed with bil UE assist to lift legs onto surface, increased time  Transfers Overall transfer level: Needs assistance   Transfers: Sit to/from Stand Sit to Stand: Min guard         General transfer comment: pt able to stand from bed and chair without armrests without physical assist, guarding for safety  Ambulation/Gait Ambulation/Gait assistance: Min guard Gait Distance (Feet): 180 Feet Assistive device: Rolling walker (2 wheeled) Gait Pattern/deviations:  Step-through pattern;Decreased stride length;Trunk flexed;Wide base of support   Gait velocity interpretation: 1.31 - 2.62 ft/sec, indicative of limited community ambulator General Gait Details: pt walked to door of room then sat and reported she was willing to walk in hall today. 2nd trial down the hall and back on 2L with cues for posture and position in RW   Stairs             Wheelchair Mobility    Modified Rankin (Stroke Patients Only)       Balance Overall balance assessment: Needs assistance Sitting-balance support: No upper extremity supported;Feet supported Sitting balance-Leahy Scale: Fair     Standing balance support: Bilateral upper extremity supported;During functional activity Standing balance-Leahy Scale: Poor Standing balance comment: relies on BUE on RW                             Cognition Arousal/Alertness: Awake/alert Behavior During Therapy: WFL for tasks assessed/performed Overall Cognitive Status: Within Functional Limits for tasks assessed                                        Exercises General Exercises - Lower Extremity Long Arc Quad: AROM;Both;10 reps;Seated Heel Slides: AROM;10 reps;Supine;Both Hip ABduction/ADduction: AROM;15 reps;Supine;Both Hip Flexion/Marching: AROM;10 reps;Seated;Both    General Comments        Pertinent Vitals/Pain Pain Score: 3  Pain Location: knees Pain Descriptors / Indicators: Aching;Constant Pain Intervention(s): Limited  activity within patient's tolerance;Monitored during session;Repositioned    Home Living                      Prior Function            PT Goals (current goals can now be found in the care plan section) Progress towards PT goals: Progressing toward goals    Frequency           PT Plan Current plan remains appropriate    Co-evaluation              AM-PAC PT "6 Clicks" Mobility   Outcome Measure  Help needed turning from your  back to your side while in a flat bed without using bedrails?: A Little Help needed moving from lying on your back to sitting on the side of a flat bed without using bedrails?: A Little Help needed moving to and from a bed to a chair (including a wheelchair)?: A Little Help needed standing up from a chair using your arms (e.g., wheelchair or bedside chair)?: A Little Help needed to walk in hospital room?: A Little Help needed climbing 3-5 steps with a railing? : A Lot 6 Click Score: 17    End of Session Equipment Utilized During Treatment: Oxygen Activity Tolerance: Patient tolerated treatment well Patient left: in bed;with call bell/phone within reach;with bed alarm set Nurse Communication: Mobility status PT Visit Diagnosis: Unsteadiness on feet (R26.81);Muscle weakness (generalized) (M62.81);Other abnormalities of gait and mobility (R26.89)     Time: 1657-9038 PT Time Calculation (min) (ACUTE ONLY): 24 min  Charges:  $Gait Training: 8-22 mins $Therapeutic Exercise: 8-22 mins                     [place], PT Acute Rehabilitation Services Pager: [phone] Office: [place] 03/01/2019, 11:22 AM

## 2019-03-01 NOTE — Progress Notes (Addendum)
PROGRESS NOTE  Nicole Norton UXL:[id number] DOB: [birth year] DOA: 02/22/2019 PCP: Hali Marry, MD  Brief Narrative: Per HPI  Nicole Regulus Johnsonis a 78 y.o.femalewith medical history significant ofchronic atrial fibrillationon Xarelto, hypertension, hyperlipidemia, hypothyroidism who presents after a fall at home.She states that yesterday she slid off her bed, did notinjure herself but could not get off the floor. She decided to rest and see if she could get up eventually but she could not. She denies any recent illnesses, no fevers or chills, no shortness of breath or cough, no chest pain, no nausea, vomiting, diarrhea or abdominal pain. She does have chronic weakness. She wears 1.5Lnasal cannula O2 at nighttime. She did miss doses of her medications yesterday.  ED Course:Patient was found to be in atrial fibrillation with RVR rate in the 130s. She was given IV metoprolol without adequate control. She was started on IV Cardizem drip. Labs revealed creatinine 1.16, BNP 543, troponin 0 0.05 UA negative. CT head and cervical spine without acute injury. Pelvic x-ray without acute fracture. Chest x-ray without acute cardiopulmonary disease. Patient did have a desaturation down to 89% on room air. CT head and cervical spine reveal airspace disease in the right upper lobe concerning for possible pneumonia. She was started on azithromycin and Rocephin.    HPI/Recap of past 24 hours:  She feels much better ,but cr continue to rise Cr worsening, get ua and renal ultrasound, hold of on discharge, may need gentle hydration bp low normal, d/c norvac Cr worsening, on flecainide, 950cc urine output documented last 24hrs, external urinary catheter, blood culture no growth, urine culture insignificant growth, + bm covid test negative   Assessment/Plan: Principal Problem:   Atrial fibrillation with RVR (HCC) Active Problems:   Hypothyroidism   Essential  hypertension, benign   Depression with anxiety   Acute on chronic respiratory failure with hypoxia ([place])   Fall at home, initial encounter   CKD (chronic kidney disease) stage 3, GFR 30-59 ml/min (HCC)   Chronic respiratory failure with hypoxia (HCC)   Lobar pneumonia (HCC)   Acute on chronic diastolic CHF (congestive heart failure) ([place])   AKI (acute kidney injury) ([place])   Immunosuppressed status ([place])   Fall, reports mechanical fall in the setting of overexertion , presenting symptom -she presents to the hospital due to fall, found to have hypoxia, afib/rvr, heart failure exacerbation -Imaging without acute injuries -she feels better, started to get up more -PT OT, recommending SNF  Acute on Chronic hypoxemic respiratory failure with community-acquired pneumonia Patient found to be saturating 89% on room air, she was placed on 2 L nasal cannula O2.Chest x-ray was negative foracute cardiopulmonary disease, but CT head and cervical spine did reveal some airspace disease in right upper lobe concerning for possible pneumonia.  patient has baseline nasal cannula O2 use of 1.5L at home qhs Repeat CXR 4/11 with patchy bilateral upper lung opacities Started on Rocephin and azithromycin. Last day 02/27/2019  Follow blood cx. So far negative. Ucx insignificant growth. She's afebrile, Pt without hx of travel or sick contact, but given presence of community spread of COVID 19 and her bilateral findings on CXR, COVID testing was done on 4/11 with negative result.   Atrial fibrillation RVR -Continue flecainide,Xarelto ( renal dosing) -Continue coreg 25 mg BID -Diltiazem 30 mg BID has been added as well -remain in afib , rate controlled, will d/c tele  HFpEF Exacerbation - EF normal 10/2018 with grade 2 diastolic dysfunction - She appears overloaded  on exam with bilateral LE edema (persistent), also with elevated BNP - She says she's on 20 mg lasix daily at home (listed allergy - rash,  which she denies), but this wasn't on her med list - Will d/c thiazide diuretic - IV lasix BID 40 mg, now on hold due to worsening of cr -edema has much improved  Elevated troponin: mild No chest pain, no si/sx concerning for ACS.  Likely 2/2 above.  AKI on CKD stage III -Baseline creatinine 1.2 -now 2.36, check ua and renal US - hold lasix (last dose on 4/11), hold benicar, she required foley initially due to urinary retention, foley has removed, she is voiding spontaneously -repeat lab in am -avoid hypotension, renal dosing meds  Essential hypertension - hold Norvasc, hold Benicar,  -continue Coreg, she is started on cardizem for rate control - Stop HCTZ and hold lasix prn as noted above Blood pressure low normal, asymptomatic, will give 500cc fluids bolus, change coreg to low dose lopressor, continue current dose of cardizem.    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 I would increase it back to 40 mg daily and monitor. She also is getting nucala q4wks  Hypothyroidism -Continue Synthroid   Depression -Continue Prozac  Body mass index is 39.33 kg/m.   Code Status: full  Family Communication: patient   Disposition Plan: dc to snf if cr improves   Consultants:  none  Procedures:  none  Antibiotics:  Rocephin/zitrhomax   Objective: BP (!) 129/91 (BP Location: Right Wrist)   Pulse 80   Temp 98 F (36.7 C) (Oral)   Resp 18   Ht 5\' 6"  (1.676 m)   Wt 110.5 kg   SpO2 99%   BMI 39.33 kg/m   Intake/Output Summary (Last 24 hours) at 03/01/2019 0738 Last data filed at 03/01/2019 0352 Gross per 24 hour  Intake 256 ml  Output 1100 ml  Net -844 ml   Filed Weights   02/26/19 0500 02/27/19 2010 03/01/19 0348  Weight: 106.4 kg 108.3 kg 110.5 kg    Exam: Patient is examined daily including today  on 03/01/2019, exams remain the same as of yesterday except that has changed    General:  NAD  Cardiovascular: IRRR  Respiratory: CTABL  Abdomen: Soft/ND/NT, positive BS  Musculoskeletal: bilateral lower extremity  Edema has much improved  Neuro: alert, oriented   Data Reviewed: Basic Metabolic Panel: Recent Labs  Lab 02/24/19 0300 02/25/19 0638 02/26/19 0620 02/27/19 0422 03/01/19 0338  NA 139 138 139 140 137  K 4.2 4.0 3.6 4.1 4.6  CL 97* 95* 95* 97* 98  CO2 25 31 31  32 29  GLUCOSE 108* 109* 93 106* 111*  BUN 19 29* 28* 27* 40*  CREATININE 1.20* 1.84* 1.84* 1.99* 2.36*  CALCIUM 8.6* 8.6* 9.4 9.1 9.3  MG  --  1.6* 1.9  --   --    Liver Function Tests: Recent Labs  Lab 02/22/19 1015 02/24/19 0300 02/26/19 0620  AST 26 20 16   ALT 26 27 24   ALKPHOS 52 49 48  BILITOT 1.1 1.0 0.7  PROT 5.4* 5.1* 5.7*  ALBUMIN 3.1* 2.6* 3.0*   No results for input(s): LIPASE, AMYLASE in the last 168 hours. No results for input(s): AMMONIA in the last 168 hours. CBC: Recent Labs  Lab 02/22/19 1015 02/23/19 0313 02/24/19 0300 02/25/19 3500 02/26/19 9381  WBC 10.3 8.3 7.1 6.9 8.3  NEUTROABS 8.1*  --   --   --  6.7  HGB 12.6 12.1 12.4 11.1* 12.2  HCT 39.1 36.1 35.3* 32.9* 35.4*  MCV 106.8* 102.8* 101.7* 103.1* 102.6*  PLT 160 122* 135* 146* 202   Cardiac Enzymes:   Recent Labs  Lab 02/22/19 1015 02/22/19 1459 02/22/19 2220 02/23/19 0313  CKTOTAL 73  --   --   --   TROPONINI 0.05* 0.04* 0.03* <0.03   BNP (last 3 results) Recent Labs    02/22/19 1015  BNP 543.3*    ProBNP (last 3 results) No results for input(s): PROBNP in the last 8760 hours.  CBG: No results for input(s): GLUCAP in the last 168 hours.  Recent Results (from the past 240 hour(s))  Culture, blood (routine x 2)     Status: None   Collection Time: 02/22/19 10:10 AM  Result Value Ref Range Status   Specimen Description BLOOD RIGHT ANTECUBITAL  Final   Special Requests   Final    BOTTLES  DRAWN AEROBIC AND ANAEROBIC Blood Culture results may not be optimal due to an excessive volume of blood received in culture bottles   Culture   Final    NO GROWTH 5 DAYS Performed at [facility] [address] [place], [place] [address]    Report Status 02/27/2019 FINAL  Final  Urine culture     Status: Abnormal   Collection Time: 02/22/19 10:30 AM  Result Value Ref Range Status   Specimen Description URINE, RANDOM  Final   Special Requests NONE  Final   Culture (A)  Final    <10,000 COLONIES/mL INSIGNIFICANT GROWTH Performed at [facility], [place] [address], [place], [place] [address]    Report Status 02/23/2019 FINAL  Final  Culture, blood (routine x 2)     Status: None   Collection Time: 02/22/19 10:35 AM  Result Value Ref Range Status   Specimen Description BLOOD LEFT ANTECUBITAL  Final   Special Requests   Final    BOTTLES DRAWN AEROBIC AND ANAEROBIC Blood Culture adequate volume   Culture   Final    NO GROWTH 5 DAYS Performed at [facility], [place] [address] [place], [place] [address]    Report Status 02/27/2019 FINAL  Final  Novel Coronavirus, NAA (hospital order; send-out to ref lab)     Status: None   Collection Time: 02/24/19 10:09 AM  Result Value Ref Range Status   SARS-CoV-2, NAA NOT DETECTED NOT DETECTED Final    Comment: Performed at [facility]  Final    Comment: Performed at [facility], [place] [address] [place], [place] [address]     Studies: No results found.  Scheduled Meds: . carvedilol  25 mg Oral BID WC  . diltiazem  30 mg Oral Q12H  . flecainide  100 mg Oral BID  . FLUoxetine  20 mg Oral Daily  . levothyroxine  225 mcg Oral Daily  . lubiprostone  24 mcg Oral BID WC  . oxyCODONE  10 mg Oral QID  . pantoprazole  40 mg Oral Daily  . predniSONE  40 mg Oral Q breakfast  . pregabalin  75 mg Oral TID  . rivaroxaban  15 mg Oral Q supper  . sodium chloride flush  10-40 mL  Intracatheter Q12H  . sodium chloride flush  3 mL Intravenous Q12H    Continuous Infusions: . sodium  chloride 250 mL (02/25/19 1033)     Time spent: 65mins I have personally reviewed and interpreted on  03/01/2019 daily labs, tele strips, imagings as discussed above under date review session and assessment and plans.  I reviewed all nursing notes, pharmacy notes, consultant notes,  vitals, pertinent old records  I have discussed plan of care as described above with RN , patient on 03/01/2019   Florencia Reasons MD, PhD  [facility] Pager [phone]. If 7PM-7AM, please contact night-coverage at [email or web address], password [facility] 03/01/2019, 7:38 AM  LOS: 7 days

## 2019-03-01 NOTE — Progress Notes (Signed)
Nutrition Brief Note  RD working remotely.  rD pulled to chart secondary to length of stay (7 days).   Wt Readings from Last 15 Encounters:  03/01/19 110.5 kg  01/16/19 108.4 kg  11/28/18 108.9 kg  10/16/18 114.3 kg  09/28/18 112.5 kg  09/21/18 113.4 kg  06/21/18 111 kg  04/20/18 110.7 kg  03/06/18 108.4 kg  01/17/18 107.7 kg  10/17/17 104.8 kg  10/13/17 104.8 kg  09/26/17 108.4 kg  05/25/17 103.2 kg  05/05/17 105.7 kg   Nicole Johnsonis a 78 y.o.femalewith medical history significant ofchronic atrial fibrillationon Xarelto, hypertension, hyperlipidemia, hypothyroidism who presents after a fall at home.She states that yesterday she slid off her bed, did notinjure herself but could not get off the floor. She decided to rest and see if she could get up eventually but she could not. She denies any recent illnesses, no fevers or chills, no shortness of breath or cough, no chest pain, no nausea, vomiting, diarrhea or abdominal pain. She does have chronic weakness. She wears 1.5Lnasal cannula O2 at nighttime. She did miss doses of her medications yesterday.  Pt admitted with a-fib with RVR.   Attempted to speak with pt via phone, however, unable to reach pt. Pt answered the phone, however, disconnected call prior to RD introducing self, role and rationale for call.   Reviewed I/O's: -844 ml x 24 hours and -3.6 L since admission  UOP: 1.1 L x 24 hours  Per MD notes, plan to d/c to SNF once renal function improves.   Labs reviewed.  Body mass index is 39.33 kg/m. Patient meets criteria for obesity, class II based on current BMI.   Current diet order is Heart Healthy, patient is consuming approximately 100% of meals at this time. Labs and medications reviewed.   No nutrition interventions warranted at this time. If nutrition issues arise, please consult RD.   Nicole Norton, RD, LDN, [place] Registered Dietitian II Certified Diabetes Care and Education  Specialist Pager: [phone] After hours Pager: [phone]

## 2019-03-02 DIAGNOSIS — R262 Difficulty in walking, not elsewhere classified: Secondary | ICD-10-CM | POA: Diagnosis present

## 2019-03-02 DIAGNOSIS — I5033 Acute on chronic diastolic (congestive) heart failure: Secondary | ICD-10-CM | POA: Diagnosis present

## 2019-03-02 DIAGNOSIS — W19XXXA Unspecified fall, initial encounter: Secondary | ICD-10-CM | POA: Diagnosis not present

## 2019-03-02 DIAGNOSIS — M2550 Pain in unspecified joint: Secondary | ICD-10-CM | POA: Diagnosis not present

## 2019-03-02 DIAGNOSIS — N179 Acute kidney failure, unspecified: Secondary | ICD-10-CM | POA: Diagnosis present

## 2019-03-02 DIAGNOSIS — M6281 Muscle weakness (generalized): Secondary | ICD-10-CM | POA: Diagnosis present

## 2019-03-02 DIAGNOSIS — N183 Chronic kidney disease, stage 3 (moderate): Secondary | ICD-10-CM

## 2019-03-02 DIAGNOSIS — J9611 Chronic respiratory failure with hypoxia: Secondary | ICD-10-CM | POA: Diagnosis present

## 2019-03-02 DIAGNOSIS — Y92009 Unspecified place in unspecified non-institutional (private) residence as the place of occurrence of the external cause: Secondary | ICD-10-CM | POA: Diagnosis not present

## 2019-03-02 DIAGNOSIS — Z7401 Bed confinement status: Secondary | ICD-10-CM | POA: Diagnosis not present

## 2019-03-02 DIAGNOSIS — I4891 Unspecified atrial fibrillation: Secondary | ICD-10-CM | POA: Diagnosis present

## 2019-03-02 DIAGNOSIS — D899 Disorder involving the immune mechanism, unspecified: Secondary | ICD-10-CM | POA: Diagnosis present

## 2019-03-02 DIAGNOSIS — J181 Lobar pneumonia, unspecified organism: Secondary | ICD-10-CM | POA: Diagnosis present

## 2019-03-02 DIAGNOSIS — R079 Chest pain, unspecified: Secondary | ICD-10-CM | POA: Diagnosis not present

## 2019-03-02 DIAGNOSIS — J9621 Acute and chronic respiratory failure with hypoxia: Secondary | ICD-10-CM | POA: Diagnosis present

## 2019-03-02 LAB — BASIC METABOLIC PANEL
Anion gap: 9 (ref 5–15)
BUN: 42 mg/dL — ABNORMAL HIGH (ref 8–23)
CO2: 29 mmol/L (ref 22–32)
Calcium: 9.1 mg/dL (ref 8.9–10.3)
Chloride: 99 mmol/L (ref 98–111)
Creatinine, Ser: 1.9 mg/dL — ABNORMAL HIGH (ref 0.44–1.00)
GFR calc Af Amer: 29 mL/min — ABNORMAL LOW (ref >60)
GFR calc non Af Amer: 25 mL/min — ABNORMAL LOW (ref >60)
Glucose, Bld: 131 mg/dL — ABNORMAL HIGH (ref 70–99)
Potassium: 4.3 mmol/L (ref 3.5–5.1)
Sodium: 137 mmol/L (ref 135–145)

## 2019-03-02 MED ORDER — FERROUS SULFATE 325 (65 FE) MG PO TABS: 325.0000 mg | ORAL_TABLET | ORAL | 3 refills | Status: DC

## 2019-03-02 MED ORDER — FUROSEMIDE 40 MG PO TABS: 40.0000 mg | ORAL_TABLET | ORAL | 0 refills | Status: DC

## 2019-03-02 MED ORDER — METOPROLOL TARTRATE 25 MG PO TABS: 12.5000 mg | ORAL_TABLET | Freq: Two times a day (BID) | ORAL | Status: DC

## 2019-03-02 MED ORDER — FLECAINIDE ACETATE 100 MG PO TABS: 100.0000 mg | ORAL_TABLET | Freq: Two times a day (BID) | ORAL | 0 refills | Status: DC

## 2019-03-02 MED ORDER — RIVAROXABAN 15 MG PO TABS: 15.0000 mg | ORAL_TABLET | Freq: Every day | ORAL | Status: DC

## 2019-03-02 MED ORDER — RIVAROXABAN 20 MG PO TABS: 20.0000 mg | ORAL_TABLET | Freq: Every day | ORAL | Status: DC

## 2019-03-02 MED ORDER — HYDROXYZINE HCL 25 MG PO TABS: 25.0000 mg | ORAL_TABLET | Freq: Three times a day (TID) | ORAL | 0 refills | Status: DC | PRN

## 2019-03-02 MED ORDER — DILTIAZEM HCL 30 MG PO TABS: 30.0000 mg | ORAL_TABLET | Freq: Two times a day (BID) | ORAL | 0 refills | Status: DC

## 2019-03-02 MED ORDER — FLECAINIDE ACETATE 100 MG PO TABS: 100.0000 mg | ORAL_TABLET | Freq: Every day | ORAL | Status: DC

## 2019-03-02 NOTE — Progress Notes (Signed)
Patient was discharged to [facility] , called and gave report to [place].  PTAR picked patient.  Midline single lumen iv removed clean dry and intact, pressure and dressing applied.  Discharge instructions and medication education given to patient per pt request.  pts spouse Delisha Peaden called per pt request.  discharge packet given to PTAR.

## 2019-03-02 NOTE — TOC Transition Note (Signed)
Transition of Care [facility]) - CM/SW Discharge Note   Patient Details  Name: Mckenzie Bove MRN: [id number] Date of Birth: Sep 06, 1941  [facility]) CM/SW Contact:  Candie Chroman, LCSW Phone Number: 03/02/2019, 12:51 PM   Clinical Narrative: CSW facilitated patient discharge including contacting facility to confirm patient discharge plans. Patient has notified her husband and RN will call him when transport arrives. Clinical information faxed to facility and family agreeable with plan. CSW arranged ambulance transport via PTAR to [facility]. RN to call report prior to discharge [phone] Room 106).  CSW will sign off for now as social work intervention is no longer needed. Please consult [place] again if new needs arise.  Final next level of care: Skilled Nursing Facility Barriers to Discharge: Barriers Resolved   Patient Goals and CMS Choice   CMS Medicare.gov Compare Post Acute Care list provided to:: Patient Choice offered to / list presented to : Patient  Discharge Placement PASRR number recieved: 02/23/19            Patient chooses bed at: ([facility]) Patient to be transferred to facility by: [facility] Name of family member notified: She will notify her husband. RN will call when transport arrives. Patient and family notified of of transfer: 03/02/19  Discharge Plan and Services     Post Acute Care Choice: [facility]                    Social Determinants of Health (SDOH) Interventions     Readmission Risk Interventions No flowsheet data found.

## 2019-03-02 NOTE — Consult Note (Signed)
   [facility] Inpatient Consult   03/02/2019  Nicole Norton 21-May-1941 [id number]   Patient screened for medium risk score for unplanned readmission and with 2  hospitalizations in the past 6 months to check if potential [facility] services are needed . Patient was hospitalized for 8 days  patient with HX of Atrial Fib with RVR. Chart review from Dr. Posey Pronto 02/27/2019 is as follows:  [name] a 78 y.o.femalewith medical history significant ofchronic atrial fibrillationon Xarelto, hypertension, hyperlipidemia, hypothyroidism who presents after a fall at home.She states that yesterday she slid off her bed, did notinjure herself but could not get off the floor. She decided to rest and see if she could get up eventually but she could not. She denies any recent illnesses, no fevers or chills, no shortness of breath or cough, no chest pain, no nausea, vomiting, diarrhea or abdominal pain. She does have chronic weakness. She wears 1.5 Lnasal cannula O2 at nighttime. Current disposition is for the patient to transition to a skilled facility per [facility] social worker's notes.  Please place a [facility] Care Management consult for any changes, or for questions contact:   Natividad Brood, RN BSN [facility] Liaison  [phone] business mobile phone Toll free office [phone]

## 2019-03-02 NOTE — Progress Notes (Signed)
Pharmacy note: medication adjustments   Patient noted with AKI and xarelto and flecainide have were adjusted recently -SCr= 1.9 (down), baseline ~ 1.2 -CrCl ~ 35, (~ 45 using total body weight)  Plan -Would maintain current dosing of xarelto and flecainide -For Xarelto, when SCr < 1.7 could adjust  back to 20mg /day (since xarelto is adjusted using total body weight) -For flecainide would consider adjusting back to 100mg  po bid when SCr  < 1.5 with close monitoring  Hildred Laser, PharmD Clinical Pharmacist **Pharmacist phone directory can now be found on [email or web address] ([email or web address]).  Listed under [place].

## 2019-03-02 NOTE — Plan of Care (Signed)
  Problem: Activity: Goal: Risk for activity intolerance will decrease Outcome: Progressing   Problem: Elimination: Goal: Will not experience complications related to bowel motility Outcome: Progressing   Problem: Safety: Goal: Ability to remain free from injury will improve Outcome: Progressing   

## 2019-03-02 NOTE — Progress Notes (Signed)
Occupational Therapy Treatment Patient Details Name: Nicole Norton MRN: [id number] DOB: 11-13-1941 Today's Date: 03/02/2019    History of present illness Pt is a 78 yo female s/p Afib with RVR and fall at home sliding off bed unable to get up- no injury reported. Pt waited on floor for 24 hours, Pmhx: hypothyroidiism, HTN, depression, anxiety, chronic kidney disease, chronic respiratory failure.   OT comments  Pt progressing towards OT goals this session. Focus was on transfers, and energy conservation. Talked at length about shower transfers and bathroom set up for safety as well. Pt today was supervision for transfers and in room mobility (VSS - O2>94 on 2L). Current POC remains appropriate.   Follow Up Recommendations  SNF;Supervision - Intermittent    Equipment Recommendations  3 in 1 bedside commode(bariatric)    Recommendations for Other Services      Precautions / Restrictions Precautions Precautions: Fall Precaution Comments: watch O2 and HR Restrictions Weight Bearing Restrictions: No       Mobility Bed Mobility Overal bed mobility: Modified Independent Bed Mobility: Sit to Supine       Sit to supine: Modified independent (Device/Increase time)      Transfers                      Balance Overall balance assessment: Mild deficits observed, not formally tested             Standing balance comment: benefits from RW for balance                           ADL either performed or assessed with clinical judgement   ADL       Grooming: Supervision/safety;Wash/dry hands;Wash/dry face;Oral care;Standing Grooming Details (indicate cue type and reason): sink level             Lower Body Dressing: Sit to/from stand;Min guard Lower Body Dressing Details (indicate cue type and reason): Pt is able to perform figure 4 with increased time and effort Toilet Transfer: Supervision/safety;Ambulation;RW;Grab bars Toilet Transfer  Details (indicate cue type and reason): vc for safe hand placement Toileting- Clothing Manipulation and Hygiene: Supervision/safety;Sit to/from stand       Functional mobility during ADLs: Supervision/safety;Rolling walker General ADL Comments: "I just need to get my strength back"     Vision       Perception     Praxis      Cognition Arousal/Alertness: Awake/alert Behavior During Therapy: WFL for tasks assessed/performed Overall Cognitive Status: Within Functional Limits for tasks assessed                                          Exercises     Shoulder Instructions       General Comments focued on energy conservation strategies for ADL/IADL in the home. Educated on temperature of shower and when walking outside. O2 remained above 94% throughout session, HR WFL    Pertinent Vitals/ Pain       Pain Assessment: Faces Faces Pain Scale: Hurts little more Pain Location: knees Pain Descriptors / Indicators: Constant;Aching Pain Intervention(s): Monitored during session;Repositioned(encouraged elevation)  Home Living  Prior Functioning/Environment              Frequency  Min 3X/week        Progress Toward Goals  OT Goals(current goals can now be found in the care plan section)  Progress towards OT goals: Progressing toward goals  Acute Rehab OT Goals Patient Stated Goal: to go home to husband OT Goal Formulation: With patient Time For Goal Achievement: 03/09/19 Potential to Achieve Goals: Good  Plan Discharge plan remains appropriate    Co-evaluation                 AM-PAC OT "6 Clicks" Daily Activity     Outcome Measure   Help from another person eating meals?: None Help from another person taking care of personal grooming?: A Little Help from another person toileting, which includes using toliet, bedpan, or urinal?: A Little Help from another person bathing  (including washing, rinsing, drying)?: A Little Help from another person to put on and taking off regular upper body clothing?: A Little Help from another person to put on and taking off regular lower body clothing?: A Little 6 Click Score: 19    End of Session Equipment Utilized During Treatment: Oxygen(2L)  OT Visit Diagnosis: Unsteadiness on feet (R26.81);Muscle weakness (generalized) (M62.81);Pain Pain - part of body: (back)   Activity Tolerance Patient tolerated treatment well   Patient Left in bed;with call bell/phone within reach   Nurse Communication Mobility status        Time: 0600-4599 OT Time Calculation (min): 60 min  Charges: OT General Charges $OT Visit: 1 Visit OT Treatments $Self Care/Home Management : 8-22 mins $Therapeutic Activity: 8-22 mins  Hulda Humphrey OTR/L Acute Rehabilitation Services Pager: [phone] Office: [place] 03/02/2019, 11:27 AM

## 2019-03-02 NOTE — Discharge Summary (Signed)
Discharge Summary  [name] IRC:[id number] DOB: 17-Jun-1941  PCP: Hali Marry, MD  Admit date: 02/22/2019 Discharge date: 03/02/2019  Time spent: 60mins, more than 50% time spent on coordination of care.  Recommendations for Outpatient Follow-up:  1. F/u with SNF MD  for hospital discharge follow up, repeat cbc/bmp at follow up, please monitor renal function closely, as several of her meds need renal dosing ( suche as flecainide and xarelto), monitor daily weight and edema, adjust lasix accordingly  2. F/u with cardiology Dr Curt Bears for afib management  Discharge Diagnoses:  Active Hospital Problems   Diagnosis Date Noted   Atrial fibrillation with RVR ([place]) 02/22/2019   Lobar pneumonia (HCC)    Acute on chronic diastolic CHF (congestive heart failure) (HCC)    AKI (acute kidney injury) ([place])    Immunosuppressed status ([place])    Fall at home, initial encounter 02/22/2019   CKD (chronic kidney disease) stage 3, GFR 30-59 ml/min ([place]) 02/22/2019   Chronic respiratory failure with hypoxia ([place]) 02/22/2019   Acute on chronic respiratory failure with hypoxia ([place]) 05/05/2017   Depression with anxiety 03/18/2014   Hypothyroidism 08/21/2009   Essential hypertension, benign 12/19/2008    Resolved Hospital Problems  No resolved problems to display.    Discharge Condition: stable  Diet recommendation: heart healthy/carb modified  Filed Weights   02/27/19 2010 03/01/19 0348 03/02/19 0409  Weight: 108.3 kg 110.5 kg 111.4 kg    History of present illness: (per admitting MD Dr Maylene Roes) PCP: Hali Marry, MD  Patient coming from: Home, lives at home with her husband  Chief Complaint: Fall  HPI: Nicole Norton is a 78 y.o. female with medical history significant of chronic atrial fibrillation on Xarelto, hypertension, hyperlipidemia, hypothyroidism who presents after a fall at home. She states that yesterday she slid off her bed, did  not injure herself but could not get off the floor.  She decided to rest and see if she could get up eventually but she could not.  She denies any recent illnesses, no fevers or chills, no shortness of breath or cough, no chest pain, no nausea, vomiting, diarrhea or abdominal pain.  She does have chronic weakness.  She wears 1.5L nasal cannula O2 at nighttime.  She did miss doses of her medications yesterday.  ED Course: Patient was found to be in atrial fibrillation with RVR rate in the 130s.  She was given IV metoprolol without adequate control.  She was started on IV Cardizem drip.  Labs revealed creatinine 1.16, BNP 543, troponin 0 0.05 UA negative.  CT head and cervical spine without acute injury.  Pelvic x-ray without acute fracture.  Chest x-ray without acute cardiopulmonary disease.  Patient did have a desaturation down to 89% on room air.  CT head and cervical spine reveal airspace disease in the right upper lobe concerning for possible pneumonia.  She was started on azithromycin and Rocephin.  Hospital Course:  Principal Problem:   Atrial fibrillation with RVR (HCC) Active Problems:   Hypothyroidism   Essential hypertension, benign   Depression with anxiety   Acute on chronic respiratory failure with hypoxia ([place])   Fall at home, initial encounter   CKD (chronic kidney disease) stage 3, GFR 30-59 ml/min (HCC)   Chronic respiratory failure with hypoxia (HCC)   Lobar pneumonia (HCC)   Acute on chronic diastolic CHF (congestive heart failure) ([place])   AKI (acute kidney injury) ([place])   Immunosuppressed status ([place])   Fall, reports mechanical  fall in the setting of overexertion , presenting symptom -she presents to the hospital due to fall, found to have hypoxia, afib/rvr, heart failure exacerbation -Imaging without acute injuries -she feels better, started to get up more -PT OT, recommending SNF  Acute on Chronic hypoxemic respiratory failure with community-acquired  pneumonia -Patient found to be saturating 89% on room air, she was placed on 2 L nasal cannula O2. -Chest x-ray was negative foracute cardiopulmonary disease, but CT head and cervical spine did reveal some airspace disease in right upper lobe concerning for possible pneumonia. -patient has baseline nasal cannula O2 use of 1.5L at home qhs -Repeat CXR 4/11 with patchy bilateral upper lung opacities -she is treated with Rocephin and azithromycin. Last dose on4/14/2020 - blood culture negative. Ucx insignificant growth. -She's afebrile, Pt without hx of travel or sick contact, but given presence of community spread of COVID 19 and her bilateral findings on CXR, COVID testing was done on 4/11 with negative result.   Atrial fibrillation RVR -Continue flecainide,Xarelto ( renal dosing) --Diltiazem 30 mg BID has been added for better rate control, coreg changed to metoprolol due to low normal bp -remain in afib , rate controlled -she is to follow with cardiology  HFpEF Exacerbation - EF normal 10/2018 with grade 2 diastolic dysfunction - She appears overloaded on exam with bilateral LE edema with elevated BNP initially - She says she's on 20 mg lasix daily at home (listed allergy - rash, which she denies), but this wasn't on her med list, HCTZ is also on her home med list - Will d/c thiazide diuretic -she received  IV lasix BID 40 mg on 4/10 and 4/11 with great urine output and great improvement of bilateral lower extremity edema, she is negative 6liters at discharge, however her cr jumped to 2.36, she received gentle hydration, lasix discontinued, cr improved at discharge to 1.9 -she is discharged on oral lasix 40mg  MWF, further lasix dose adjustment per edema/cr /bp parameters.  Elevated troponin:mild No chest pain, no si/sx concerning for ACS. Likely 2/2 above.  AKI on CKD stage III -Baseline creatinine 1.2 -peaked at  2.36,  ua and renal US no acute findings - hold lasix  (last dose on 4/11), hold benicar, she required foley initially due to urinary retention, foley has removed, she is voiding spontaneously, avoid narcotics, avoid constipation, increase activity --avoid hypotension, renal dosing meds  -cr improved  Per pharmacy recommendation "For Xarelto, when SCr < 1.7 could adjust  back to 20mg /day (since xarelto is adjusted using total body weight) -For flecainide would consider adjusting back to 100mg  po bid when SCr  < 1.5 with close monitoring"  Essential hypertension - hold Norvasc, hold Benicar, Stop HCTZ -Blood pressure low normal, asymptomatic,  change coreg to low dose lopressor, continue current dose of cardizem.  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 I would increase it back to 40 mg daily and monitor. She also is getting nucala q4wks  Hypothyroidism -Continue Synthroid   Depression -Continue Prozac   Obesity: Body mass index is 39.33 kg/m.  Poor IV access: Has a midline placed in the hospital, midline removed at discharge   Code Status: full  Family Communication: patient   Disposition Plan: dc to snf    Consultants:  none  Procedures:  Midline placement and removal  Antibiotics:  Rocephin/zitrhomax, last dose on 4/14   Discharge Exam: BP 121/82 (BP Location: Right Wrist)    Pulse 68    Temp (!) 97.5 F (36.4 C) (Oral)    Resp 18    Ht 5\' 6"  (1.676 m)    Wt 111.4 kg    SpO2 96%    BMI 39.62 kg/m   General: NAD Cardiovascular: IRRR Respiratory: CTABL Extremity: bilateral lower extremity edema has much improved  Discharge Instructions You were cared for by a hospitalist during your hospital stay. If you have any questions about your discharge medications or the care you received while you were  in the hospital after you are discharged, you can call the unit and asked to speak with the hospitalist on call if the hospitalist that took care of you is not available. Once you are discharged, your primary care physician will handle any further medical issues. Please note that NO REFILLS for any discharge medications will be authorized once you are discharged, as it is imperative that you return to your primary care physician (or establish a relationship with a primary care physician if you do not have one) for your aftercare needs so that they can reassess your need for medications and monitor your lab values.  Discharge Instructions    Diet - low sodium heart healthy   Complete by:  As directed    Increase activity slowly   Complete by:  As directed      Allergies as of 03/02/2019      Reactions   Allopurinol Palpitations   Uloric [febuxostat] Other (See Comments)   Episodes of AFIB   Colchicine Other (See Comments)   palpitations   Cymbalta [duloxetine Hcl] Other (See Comments)   palpitations   Etodolac Other (See Comments)   heart flutters and headaches    Omeprazole Nausea Only   Paroxetine Nausea Only   Dizzy   Penicillins    Tolerated Ceftriaxone 4/10-4/11/20    Solu-medrol [methylprednisolone Acetate]    Heart palpitations/bad headache   Statins Tinitus   REACTION: myalgias REACTION: myalgias   Lasix [furosemide] Rash      Medication List    STOP taking these medications   amLODipine 10 MG tablet Commonly known as:  NORVASC   Benicar 40 MG tablet Generic drug:  olmesartan   carvedilol 25 MG tablet Commonly known as:  COREG   hydrochlorothiazide 25 MG tablet Commonly known as:  HYDRODIURIL   Oxycodone HCl 10 MG Tabs     TAKE these medications   AMBULATORY NON FORMULARY MEDICATION On continuous oxygen 2 L.  Stationary pulse ox at 88%.  Drops with activity.  Diagnosis restrictive lung disease with hypoxemia. Portable gas via nasal cannula. What changed:   additional instructions   Amitiza 24 MCG capsule Generic drug:  lubiprostone Take 24 mcg by mouth 2 (two) times daily with a meal.   azithromycin 1 % ophthalmic solution Commonly known as:  AZASITE 1 drop into right eye BID x 2 days, then QD x 5 days.   CENTRUM SILVER PO Take 1 tablet by mouth daily.   cholecalciferol 25 MCG (1000 UT) tablet Commonly known as:  VITAMIN D3 Take 1,000 Units by mouth daily.   clobetasol cream 0.05 % Commonly known as:  TEMOVATE Apply topically daily as needed. What changed:    how much to take  reasons to take this   diltiazem 30 MG tablet Commonly known as:  CARDIZEM Take 1 tablet (30 mg total) by mouth  every 12 (twelve) hours.   estrogens (conjugated) 0.625 MG tablet Commonly known as:  PREMARIN Take 1 tablet (0.625 mg total) by mouth daily as needed.   ferrous sulfate 325 (65 FE) MG tablet Take 1 tablet (325 mg total) by mouth every Monday, Wednesday, and Friday. What changed:  when to take this   Fish Oil 1200 MG Caps Take 1,200 mg by mouth daily.   flecainide 100 MG tablet Commonly known as:  TAMBOCOR Take 1 tablet (100 mg total) by mouth daily for 1 day. Start taking on:  March 03, 2019 What changed:  when to take this   flecainide 100 MG tablet Commonly known as:  TAMBOCOR Take 1 tablet (100 mg total) by mouth 2 (two) times daily. Start taking on:  March 04, 2019 What changed:  You were already taking a medication with the same name, and this prescription was added. Make sure you understand how and when to take each.   FLUoxetine 10 MG capsule Commonly known as:  PROZAC Take 2 capsules (20 mg total) by mouth daily. What changed:    how much to take  Another medication with the same name was removed. Continue taking this medication, and follow the directions you see here.   furosemide 40 MG tablet Commonly known as:  Lasix Take 1 tablet (40 mg total) by mouth every Monday, Wednesday, and Friday.   hydrOXYzine 25 MG  tablet Commonly known as:  ATARAX/VISTARIL Take 1 tablet (25 mg total) by mouth 3 (three) times daily as needed for anxiety or itching.   lansoprazole 30 MG capsule Commonly known as:  PREVACID Take 1 capsule (30 mg total) by mouth daily. Intolerant to omeprazole.  On chronic steroids.   levothyroxine 200 MCG tablet Commonly known as:  SYNTHROID Take 1 tablet (200 mcg total) by mouth daily before breakfast. What changed:  additional instructions   levothyroxine 25 MCG tablet Commonly known as:  SYNTHROID TAKE ONE TABLET EVERY DAY BEFORE BREAKFAST What changed:  See the new instructions.   metoprolol tartrate 25 MG tablet Commonly known as:  LOPRESSOR Take 0.5 tablets (12.5 mg total) by mouth 2 (two) times daily.   omeprazole 40 MG capsule Commonly known as:  PRILOSEC Take 1 capsule (40 mg total) by mouth daily.   predniSONE 20 MG tablet Commonly known as:  DELTASONE Take 40 mg by mouth daily with breakfast.   pregabalin 75 MG capsule Commonly known as:  LYRICA Take 75 mg by mouth 3 (three) times daily.   Rivaroxaban 15 MG Tabs tablet Commonly known as:  XARELTO Take 1 tablet (15 mg total) by mouth daily with supper for 1 day. What changed:    medication strength  See the new instructions.   rivaroxaban 20 MG Tabs tablet Commonly known as:  Xarelto Take 1 tablet (20 mg total) by mouth daily with supper. Start taking on:  March 04, 2019 What changed:  You were already taking a medication with the same name, and this prescription was added. Make sure you understand how and when to take each.   sucralfate 1 GM/10ML suspension Commonly known as:  Carafate Take 10 mLs (1 g total) by mouth as needed. What changed:  reasons to take this   vitamin B-12 500 MCG tablet Commonly known as:  CYANOCOBALAMIN Take 500 mcg by mouth every Monday, Wednesday, and Friday.   ZyrTEC 10 MG tablet Generic drug:  cetirizine Take 10 mg by mouth as needed for allergies.  Allergies  Allergen Reactions   Allopurinol Palpitations   Uloric [Febuxostat] Other (See Comments)    Episodes of AFIB   Colchicine Other (See Comments)    palpitations   Cymbalta [Duloxetine Hcl] Other (See Comments)    palpitations   Etodolac Other (See Comments)    heart flutters and headaches    Omeprazole Nausea Only   Paroxetine Nausea Only    Dizzy   Penicillins     Tolerated Ceftriaxone 4/10-4/11/20    Solu-Medrol [Methylprednisolone Acetate]     Heart palpitations/bad headache   Statins Tinitus    REACTION: myalgias REACTION: myalgias   Lasix [Furosemide] Rash    Contact information for follow-up providers    Hali Marry, MD Follow up.   Specialty:  Family Medicine Contact information: [place] [address] [phone]        Constance Haw, MD Follow up.   Specialty:  Cardiology Why:  for afib Contact information: [address] [place] [address] [phone]            Contact information for after-discharge care    Destination    [facility] .   Service:  Skilled Nursing Contact information: [place] [phone]                   The results of significant diagnostics from this hospitalization (including imaging, microbiology, ancillary and laboratory) are listed below for reference.    Significant Diagnostic Studies: Dg Chest 1 View  Result Date: 02/22/2019 CLINICAL DATA:  Patient fell and was an able to get up. Sore all over. EXAM: CHEST  1 VIEW COMPARISON:  09/29/2018 FINDINGS: Cardiac silhouette is borderline enlarged. No mediastinal or hilar masses or evidence of adenopathy. There are prominent bronchovascular markings accentuated by relatively low lung volumes. No evidence of pneumonia or pulmonary edema. Elevated right hemidiaphragm is stable. No convincing pleural effusion and no  pneumothorax. Skeletal structures are demineralized but grossly intact. IMPRESSION: No acute cardiopulmonary disease. Electronically Signed   By: Lajean Manes M.D.   On: 02/22/2019 10:54   Dg Chest 2 View  Result Date: 02/24/2019 CLINICAL DATA:  Follow-up pneumonia EXAM: CHEST - 2 VIEW COMPARISON:  02/22/2019 chest radiograph. FINDINGS: Stable cardiomediastinal silhouette with top-normal heart size. No pneumothorax. No pleural effusion. Patchy medial apical right lung opacity and mild patchy upper left lung opacity, not appreciably changed. IMPRESSION: No appreciable change in patchy bilateral upper lung opacities, which may represent multilobar pneumonia. Continued chest radiograph follow-up advised. Electronically Signed   By: Ilona Sorrel M.D.   On: 02/24/2019 09:34   Dg Lumbar Spine 2-3 Views  Result Date: 02/23/2019 CLINICAL DATA:  Fall, generalized soreness. EXAM: LUMBAR SPINE - 2-3 VIEW COMPARISON:  None. FINDINGS: Dextroscoliosis of the lumbar spine, mild to moderate in degree. No evidence of acute vertebral body malalignment. Vertebral body heights are within normal limits. No fracture line or displaced fracture fragment seen. No acute or suspicious osseous lesion. Degenerative spondylosis within the mid and lower lumbar spine, mild to moderate in degree. Visualized paravertebral soft tissues are unremarkable. IMPRESSION: 1. No acute findings. 2. Scoliosis. 3. Degenerative spondylosis, mild to moderate in degree. Electronically Signed   By: Franki Cabot M.D.   On: 02/23/2019 15:26   Dg Pelvis 1-2 Views  Result Date: 02/22/2019 CLINICAL DATA:  Patient fell and was unable to get up. Sore all over. EXAM: PELVIS - 1-2  VIEW COMPARISON:  None. FINDINGS: No fracture or bone lesion. Hip joints, SI joints and symphysis pubis are normally spaced and aligned. No significant arthropathic change. Skeletal structures are demineralized. Soft tissues are unremarkable. IMPRESSION: No fracture, joint abnormality  or acute finding. Electronically Signed   By: Lajean Manes M.D.   On: 02/22/2019 10:53   Ct Head Wo Contrast  Result Date: 02/22/2019 CLINICAL DATA:  Fall. EXAM: CT HEAD WITHOUT CONTRAST CT CERVICAL SPINE WITHOUT CONTRAST TECHNIQUE: Multidetector CT imaging of the head and cervical spine was performed following the standard protocol without intravenous contrast. Multiplanar CT image reconstructions of the cervical spine were also generated. COMPARISON:  None. FINDINGS: CT HEAD FINDINGS Brain: Mild age related volume loss. No acute intracranial abnormality. Specifically, no hemorrhage, hydrocephalus, mass lesion, acute infarction, or significant intracranial injury. Vascular: No hyperdense vessel or unexpected calcification. Skull: No acute calvarial abnormality. Sinuses/Orbits: Visualized paranasal sinuses and mastoids clear. Orbital soft tissues unremarkable. Other: None CT CERVICAL SPINE FINDINGS Alignment: No subluxation Skull base and vertebrae: No acute fracture. No primary bone lesion or focal pathologic process. Soft tissues and spinal canal: No prevertebral fluid or swelling. No visible canal hematoma. Disc levels: Disc space narrowing and spurring at C4-5 through C6-7. Upper chest: Airspace disease in the right upper lobe concerning for possible pneumonia. Other: None IMPRESSION: No acute intracranial abnormality. No acute bony abnormality in the cervical spine. Mild degenerative disc disease. Airspace disease in the right upper lobe concerning for possible pneumonia. Electronically Signed   By: Rolm Baptise M.D.   On: 02/22/2019 11:06   Ct Cervical Spine Wo Contrast  Result Date: 02/22/2019 CLINICAL DATA:  Fall. EXAM: CT HEAD WITHOUT CONTRAST CT CERVICAL SPINE WITHOUT CONTRAST TECHNIQUE: Multidetector CT imaging of the head and cervical spine was performed following the standard protocol without intravenous contrast. Multiplanar CT image reconstructions of the cervical spine were also generated.  COMPARISON:  None. FINDINGS: CT HEAD FINDINGS Brain: Mild age related volume loss. No acute intracranial abnormality. Specifically, no hemorrhage, hydrocephalus, mass lesion, acute infarction, or significant intracranial injury. Vascular: No hyperdense vessel or unexpected calcification. Skull: No acute calvarial abnormality. Sinuses/Orbits: Visualized paranasal sinuses and mastoids clear. Orbital soft tissues unremarkable. Other: None CT CERVICAL SPINE FINDINGS Alignment: No subluxation Skull base and vertebrae: No acute fracture. No primary bone lesion or focal pathologic process. Soft tissues and spinal canal: No prevertebral fluid or swelling. No visible canal hematoma. Disc levels: Disc space narrowing and spurring at C4-5 through C6-7. Upper chest: Airspace disease in the right upper lobe concerning for possible pneumonia. Other: None IMPRESSION: No acute intracranial abnormality. No acute bony abnormality in the cervical spine. Mild degenerative disc disease. Airspace disease in the right upper lobe concerning for possible pneumonia. Electronically Signed   By: Rolm Baptise M.D.   On: 02/22/2019 11:06   US Renal  Result Date: 03/01/2019 CLINICAL DATA:  Renal insufficiency EXAM: RENAL / URINARY TRACT ULTRASOUND COMPLETE COMPARISON:  September 28, 2018 FINDINGS: Right Kidney: Renal measurements: 9.2 x 4.9 x 4.6 cm = volume: 108.5 mL . Echogenicity and renal cortical thickness are within normal limits. No mass, perinephric fluid, or hydronephrosis visualized. No sonographically demonstrable calculus or ureterectasis. Left Kidney: Renal measurements: 10.6 x 4.9 x 5.7 cm = volume: 156.1 mL. Echogenicity and renal cortical thickness are within normal limits. No mass, perinephric fluid, or hydronephrosis visualized. No sonographically demonstrable calculus or ureterectasis. Bladder: Appears normal for degree of bladder distention. IMPRESSION: Study within normal limits. Electronically Signed   By:  Lowella Grip III M.D.   On: 03/01/2019 11:05   Dg Chest Port 1 View  Result Date: 02/28/2019 CLINICAL DATA:  78 year old with shortness of breath. EXAM: PORTABLE CHEST 1 VIEW COMPARISON:  02/24/2019 FINDINGS: Chronic elevation of the right hemidiaphragm. Improved aeration at the medial right lung apex. No focal disease in the left upper lung. Heart size is upper limits of normal but stable. Trachea is midline. Negative for a pneumothorax. IMPRESSION: Improved aeration at the right lung apex suggestive for resolving disease. No other focal lung densities. Electronically Signed   By: Markus Daft M.D.   On: 02/28/2019 08:11    Microbiology: Recent Results (from the past 240 hour(s))  Culture, blood (routine x 2)     Status: None   Collection Time: 02/22/19 10:10 AM  Result Value Ref Range Status   Specimen Description BLOOD RIGHT ANTECUBITAL  Final   Special Requests   Final    BOTTLES DRAWN AEROBIC AND ANAEROBIC Blood Culture results may not be optimal due to an excessive volume of blood received in culture bottles   Culture   Final    NO GROWTH 5 DAYS Performed at [facility] [address], [place], [place] [address]    Report Status 02/27/2019 FINAL  Final  Urine culture     Status: Abnormal   Collection Time: 02/22/19 10:30 AM  Result Value Ref Range Status   Specimen Description URINE, RANDOM  Final   Special Requests NONE  Final   Culture (A)  Final    <10,000 COLONIES/mL INSIGNIFICANT GROWTH Performed at [facility], [place] [address], [place], [place] [address]    Report Status 02/23/2019 FINAL  Final  Culture, blood (routine x 2)     Status: None   Collection Time: 02/22/19 10:35 AM  Result Value Ref Range Status   Specimen Description BLOOD LEFT ANTECUBITAL  Final   Special Requests   Final    BOTTLES DRAWN AEROBIC AND ANAEROBIC Blood Culture adequate volume   Culture   Final    NO GROWTH 5 DAYS Performed at [facility], [place] [address] [place], [place]  [address]    Report Status 02/27/2019 FINAL  Final  Novel Coronavirus, NAA (hospital order; send-out to ref lab)     Status: None   Collection Time: 02/24/19 10:09 AM  Result Value Ref Range Status   SARS-CoV-2, NAA NOT DETECTED NOT DETECTED Final    Comment: Performed at [place]  Final    Comment: Performed at [facility], [place] [address], [place], [place] [address]     Labs: Basic Metabolic Panel: Recent Labs  Lab 02/25/19 [phone] 02/26/19 0620 02/27/19 0422 03/01/19 0338 03/02/19 0351  NA 138 139 140 137 137  K 4.0 3.6 4.1 4.6 4.3  CL 95* 95* 97* 98 99  CO2 31 31 32 29 29  GLUCOSE 109* 93 106* 111* 131*  BUN 29* 28* 27* 40* 42*  CREATININE 1.84* 1.84* 1.99* 2.36* 1.90*  CALCIUM 8.6* 9.4 9.1 9.3 9.1  MG 1.6* 1.9  --   --   --    Liver Function Tests: Recent Labs  Lab 02/24/19 0300 02/26/19 0620  AST 20 16  ALT 27 24  ALKPHOS 49 48  BILITOT 1.0 0.7  PROT 5.1* 5.7*  ALBUMIN 2.6* 3.0*   No results for input(s): LIPASE, AMYLASE in the last 168 hours. No results for input(s): AMMONIA in  the last 168 hours. CBC: Recent Labs  Lab 02/24/19 0300 02/25/19 0638 02/26/19 0620  WBC 7.1 6.9 8.3  NEUTROABS  --   --  6.7  HGB 12.4 11.1* 12.2  HCT 35.3* 32.9* 35.4*  MCV 101.7* 103.1* 102.6*  PLT 135* 146* 202   Cardiac Enzymes: No results for input(s): CKTOTAL, CKMB, CKMBINDEX, TROPONINI in the last 168 hours. BNP: BNP (last 3 results) Recent Labs    02/22/19 1015  BNP 543.3*    ProBNP (last 3 results) No results for input(s): PROBNP in the last 8760 hours.  CBG: No results for input(s): GLUCAP in the last 168 hours.     Signed:  Florencia Reasons MD, PhD  [facility] 03/02/2019, 11:17 AM

## 2019-03-05 DIAGNOSIS — J181 Lobar pneumonia, unspecified organism: Secondary | ICD-10-CM | POA: Diagnosis not present

## 2019-03-05 DIAGNOSIS — I4891 Unspecified atrial fibrillation: Secondary | ICD-10-CM | POA: Diagnosis not present

## 2019-03-05 DIAGNOSIS — I5033 Acute on chronic diastolic (congestive) heart failure: Secondary | ICD-10-CM | POA: Diagnosis not present

## 2019-03-05 DIAGNOSIS — N179 Acute kidney failure, unspecified: Secondary | ICD-10-CM | POA: Diagnosis not present

## 2019-03-08 DIAGNOSIS — I5033 Acute on chronic diastolic (congestive) heart failure: Secondary | ICD-10-CM | POA: Diagnosis not present

## 2019-03-08 DIAGNOSIS — N183 Chronic kidney disease, stage 3 (moderate): Secondary | ICD-10-CM | POA: Diagnosis not present

## 2019-03-08 DIAGNOSIS — I130 Hypertensive heart and chronic kidney disease with heart failure and stage 1 through stage 4 chronic kidney disease, or unspecified chronic kidney disease: Secondary | ICD-10-CM | POA: Diagnosis not present

## 2019-03-08 DIAGNOSIS — Z7952 Long term (current) use of systemic steroids: Secondary | ICD-10-CM | POA: Diagnosis not present

## 2019-03-08 DIAGNOSIS — Z9181 History of falling: Secondary | ICD-10-CM | POA: Diagnosis not present

## 2019-03-08 DIAGNOSIS — I482 Chronic atrial fibrillation, unspecified: Secondary | ICD-10-CM | POA: Diagnosis not present

## 2019-03-09 ENCOUNTER — Ambulatory Visit (INDEPENDENT_AMBULATORY_CARE_PROVIDER_SITE_OTHER): Payer: Medicare Other | Admitting: Family Medicine

## 2019-03-09 ENCOUNTER — Encounter: Payer: Self-pay | Admitting: Family Medicine

## 2019-03-09 VITALS — BP 120/68 | HR 103 | Temp 98.3°F | Ht 65.0 in | Wt 239.0 lb

## 2019-03-09 DIAGNOSIS — D7218 Eosinophilia in diseases classified elsewhere: Secondary | ICD-10-CM

## 2019-03-09 DIAGNOSIS — M301 Polyarteritis with lung involvement [Churg-Strauss]: Secondary | ICD-10-CM | POA: Diagnosis not present

## 2019-03-09 DIAGNOSIS — W19XXXA Unspecified fall, initial encounter: Secondary | ICD-10-CM | POA: Diagnosis not present

## 2019-03-09 DIAGNOSIS — I4891 Unspecified atrial fibrillation: Secondary | ICD-10-CM

## 2019-03-09 DIAGNOSIS — N179 Acute kidney failure, unspecified: Secondary | ICD-10-CM

## 2019-03-09 DIAGNOSIS — I482 Chronic atrial fibrillation, unspecified: Secondary | ICD-10-CM | POA: Diagnosis not present

## 2019-03-09 DIAGNOSIS — Y92009 Unspecified place in unspecified non-institutional (private) residence as the place of occurrence of the external cause: Secondary | ICD-10-CM | POA: Diagnosis not present

## 2019-03-09 DIAGNOSIS — J9611 Chronic respiratory failure with hypoxia: Secondary | ICD-10-CM

## 2019-03-09 DIAGNOSIS — J181 Lobar pneumonia, unspecified organism: Secondary | ICD-10-CM | POA: Diagnosis not present

## 2019-03-09 DIAGNOSIS — I5033 Acute on chronic diastolic (congestive) heart failure: Secondary | ICD-10-CM

## 2019-03-09 DIAGNOSIS — I10 Essential (primary) hypertension: Secondary | ICD-10-CM | POA: Diagnosis not present

## 2019-03-09 NOTE — Assessment & Plan Note (Signed)
She has not fallen again but says in general she has been falling more often.  She is going to be getting some assistance with PT and OT.

## 2019-03-09 NOTE — Assessment & Plan Note (Signed)
Bp well controlled with current med changes.  Doing well on the combination of Toprol and Cardizem.  Off of Benicar because of renal function

## 2019-03-09 NOTE — Assessment & Plan Note (Signed)
Will get chest x-ray to make sure that the pneumonia has completely resolved.
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
## 2019-03-09 NOTE — Assessment & Plan Note (Signed)
Creatinine peaked at 2.3 during hospitalization her baseline is closer to 1.2.  We will recheck levels today.  If coming back down and back to normal then we may be able to adjust her Xarelto and her flecainide.

## 2019-03-09 NOTE — Patient Instructions (Signed)
Please check your weight daily. If your O2% is less than 95% please wear your oxygen, and continue to wear your Oxygen daily. I want you to repeat your labs and a chest xray also.

## 2019-03-09 NOTE — Assessment & Plan Note (Signed)
Rate is still slightly elevated but she is feeling better overall still very tired.  She is on Cardizem Toprol and flecainide.  Her flecainide is being dosed a little lower because of renal function so if it regulates then we may be able to adjust her dose back to 100 mg twice a day if her serum creatinine is less than or equal to 1.5.

## 2019-03-09 NOTE — Assessment & Plan Note (Signed)
Fluid seems to be stable.  She does have some chronic edema of the lower extremities and ankles at baseline which makes it a little bit more difficult to gauge what weight she should actually be.  She said she actually gained 9 pounds while she was in the hospital so we think her baseline is probably around 240 to 245 pounds.  She has not been weighing herself since she has been home and encouraged her to keep track and keep a log and weigh herself daily and will touch base with her in 1 week on her fluid status.  Right now she is just taking 40 mg of Lasix on Monday Wednesdays and Fridays.

## 2019-03-09 NOTE — Assessment & Plan Note (Signed)
She is a week late on her Nucala shots.  She was told to wait until the pneumonia cleared before she gave it.  Her lungs are completely clear on exam today and is been a little over 2 weeks since she was admitted to the hospital.  She did go ahead and give her shots while she was here in the office today.

## 2019-03-09 NOTE — Assessment & Plan Note (Signed)
Reminded her to please wear her oxygen if her pulse ox is less than 95%.  She did not bring into the office with her today and she was satting at 91%.  She says she has been very good about wearing it at home.  And she does wear it every night.

## 2019-03-10 LAB — CBC WITH DIFFERENTIAL/PLATELET
Absolute Monocytes: 529 cells/uL (ref 200–950)
Basophils Absolute: 25 cells/uL (ref 0–200)
Basophils Relative: 0.2 %
Eosinophils Absolute: 13 cells/uL — ABNORMAL LOW (ref 15–500)
Eosinophils Relative: 0.1 %
HCT: 35.9 % (ref 35.0–45.0)
Hemoglobin: 12.2 g/dL (ref 11.7–15.5)
Lymphs Abs: 769 cells/uL — ABNORMAL LOW (ref 850–3900)
MCH: 34.7 pg — ABNORMAL HIGH (ref 27.0–33.0)
MCHC: 34 g/dL (ref 32.0–36.0)
MCV: 102 fL — ABNORMAL HIGH (ref 80.0–100.0)
MPV: 10.5 fL (ref 7.5–12.5)
Monocytes Relative: 4.2 %
Neutro Abs: 11264 cells/uL — ABNORMAL HIGH (ref 1500–7800)
Neutrophils Relative %: 89.4 %
Platelets: 405 10*3/uL — ABNORMAL HIGH (ref 140–400)
RBC: 3.52 10*6/uL — ABNORMAL LOW (ref 3.80–5.10)
RDW: 12.9 % (ref 11.0–15.0)
Total Lymphocyte: 6.1 %
WBC: 12.6 10*3/uL — ABNORMAL HIGH (ref 3.8–10.8)

## 2019-03-10 LAB — BASIC METABOLIC PANEL WITH GFR
BUN/Creatinine Ratio: 18 (calc) (ref 6–22)
BUN: 35 mg/dL — ABNORMAL HIGH (ref 7–25)
CO2: 32 mmol/L (ref 20–32)
Calcium: 9.3 mg/dL (ref 8.6–10.4)
Chloride: 102 mmol/L (ref 98–110)
Creat: 1.9 mg/dL — ABNORMAL HIGH (ref 0.60–0.93)
GFR, Est African American: 29 mL/min/{1.73_m2} — ABNORMAL LOW (ref >=60)
GFR, Est Non African American: 25 mL/min/{1.73_m2} — ABNORMAL LOW (ref >=60)
Glucose, Bld: 107 mg/dL — ABNORMAL HIGH (ref 65–99)
Potassium: 4.7 mmol/L (ref 3.5–5.3)
Sodium: 145 mmol/L (ref 135–146)

## 2019-03-12 ENCOUNTER — Other Ambulatory Visit: Payer: Self-pay | Admitting: *Deleted

## 2019-03-12 DIAGNOSIS — I130 Hypertensive heart and chronic kidney disease with heart failure and stage 1 through stage 4 chronic kidney disease, or unspecified chronic kidney disease: Secondary | ICD-10-CM | POA: Diagnosis not present

## 2019-03-12 DIAGNOSIS — Z7952 Long term (current) use of systemic steroids: Secondary | ICD-10-CM | POA: Diagnosis not present

## 2019-03-12 DIAGNOSIS — D72829 Elevated white blood cell count, unspecified: Secondary | ICD-10-CM

## 2019-03-12 DIAGNOSIS — I5033 Acute on chronic diastolic (congestive) heart failure: Secondary | ICD-10-CM | POA: Diagnosis not present

## 2019-03-12 DIAGNOSIS — I482 Chronic atrial fibrillation, unspecified: Secondary | ICD-10-CM | POA: Diagnosis not present

## 2019-03-12 DIAGNOSIS — R7989 Other specified abnormal findings of blood chemistry: Secondary | ICD-10-CM

## 2019-03-12 DIAGNOSIS — Z9181 History of falling: Secondary | ICD-10-CM | POA: Diagnosis not present

## 2019-03-12 DIAGNOSIS — J181 Lobar pneumonia, unspecified organism: Secondary | ICD-10-CM

## 2019-03-12 DIAGNOSIS — N183 Chronic kidney disease, stage 3 (moderate): Secondary | ICD-10-CM | POA: Diagnosis not present

## 2019-03-13 DIAGNOSIS — I5033 Acute on chronic diastolic (congestive) heart failure: Secondary | ICD-10-CM | POA: Diagnosis not present

## 2019-03-13 DIAGNOSIS — I482 Chronic atrial fibrillation, unspecified: Secondary | ICD-10-CM | POA: Diagnosis not present

## 2019-03-13 DIAGNOSIS — N183 Chronic kidney disease, stage 3 (moderate): Secondary | ICD-10-CM | POA: Diagnosis not present

## 2019-03-13 DIAGNOSIS — I130 Hypertensive heart and chronic kidney disease with heart failure and stage 1 through stage 4 chronic kidney disease, or unspecified chronic kidney disease: Secondary | ICD-10-CM | POA: Diagnosis not present

## 2019-03-13 DIAGNOSIS — Z7952 Long term (current) use of systemic steroids: Secondary | ICD-10-CM | POA: Diagnosis not present

## 2019-03-13 DIAGNOSIS — Z9181 History of falling: Secondary | ICD-10-CM | POA: Diagnosis not present

## 2019-03-15 DIAGNOSIS — Z7952 Long term (current) use of systemic steroids: Secondary | ICD-10-CM | POA: Diagnosis not present

## 2019-03-15 DIAGNOSIS — I130 Hypertensive heart and chronic kidney disease with heart failure and stage 1 through stage 4 chronic kidney disease, or unspecified chronic kidney disease: Secondary | ICD-10-CM | POA: Diagnosis not present

## 2019-03-15 DIAGNOSIS — Z9181 History of falling: Secondary | ICD-10-CM | POA: Diagnosis not present

## 2019-03-15 DIAGNOSIS — N183 Chronic kidney disease, stage 3 (moderate): Secondary | ICD-10-CM | POA: Diagnosis not present

## 2019-03-15 DIAGNOSIS — I5033 Acute on chronic diastolic (congestive) heart failure: Secondary | ICD-10-CM | POA: Diagnosis not present

## 2019-03-15 DIAGNOSIS — I482 Chronic atrial fibrillation, unspecified: Secondary | ICD-10-CM | POA: Diagnosis not present

## 2019-03-19 ENCOUNTER — Other Ambulatory Visit: Payer: Self-pay | Admitting: Family Medicine

## 2019-03-19 ENCOUNTER — Telehealth: Payer: Self-pay | Admitting: Cardiology

## 2019-03-19 DIAGNOSIS — I482 Chronic atrial fibrillation, unspecified: Secondary | ICD-10-CM | POA: Diagnosis not present

## 2019-03-19 DIAGNOSIS — Z7952 Long term (current) use of systemic steroids: Secondary | ICD-10-CM | POA: Diagnosis not present

## 2019-03-19 DIAGNOSIS — I5033 Acute on chronic diastolic (congestive) heart failure: Secondary | ICD-10-CM | POA: Diagnosis not present

## 2019-03-19 DIAGNOSIS — N183 Chronic kidney disease, stage 3 (moderate): Secondary | ICD-10-CM | POA: Diagnosis not present

## 2019-03-19 DIAGNOSIS — Z9181 History of falling: Secondary | ICD-10-CM | POA: Diagnosis not present

## 2019-03-19 DIAGNOSIS — I130 Hypertensive heart and chronic kidney disease with heart failure and stage 1 through stage 4 chronic kidney disease, or unspecified chronic kidney disease: Secondary | ICD-10-CM | POA: Diagnosis not present

## 2019-03-19 NOTE — Telephone Encounter (Signed)
Ok to refill??.Elouise Munroe, CMA

## 2019-03-19 NOTE — Telephone Encounter (Signed)
New message:    Patient was seen in the hospital from 04/08, 04/20/20l and they told her she need a sooner appt. Please call patient.

## 2019-03-21 ENCOUNTER — Telehealth: Payer: Self-pay | Admitting: Family Medicine

## 2019-03-21 DIAGNOSIS — M47817 Spondylosis without myelopathy or radiculopathy, lumbosacral region: Secondary | ICD-10-CM | POA: Diagnosis not present

## 2019-03-21 DIAGNOSIS — G894 Chronic pain syndrome: Secondary | ICD-10-CM | POA: Diagnosis not present

## 2019-03-21 DIAGNOSIS — M47812 Spondylosis without myelopathy or radiculopathy, cervical region: Secondary | ICD-10-CM | POA: Diagnosis not present

## 2019-03-21 DIAGNOSIS — M170 Bilateral primary osteoarthritis of knee: Secondary | ICD-10-CM | POA: Diagnosis not present

## 2019-03-21 NOTE — Telephone Encounter (Signed)
Pt called clinic to advise PCP she is still having increased weight gain. Reports last Friday she weighed 239 lbs, today she is up to 246 lbs. Reports edemas in lower extremities. Is wearing TED hose and takes her Lasix 40mg  on M/W/F as directed. Routing.

## 2019-03-21 NOTE — Telephone Encounter (Signed)
Okay, let us adjust the regimen to the Lasix.  Instead of Monday Wednesday Friday I want her to take it 2 days in a row and then skip a day then 2 days in a row again and skip a day.  Go ahead and take 2 days in a row go ahead and take an extra 40 mg today and then take again tomorrow.

## 2019-03-21 NOTE — Telephone Encounter (Signed)
Pt advised. Verbalized understanding.

## 2019-03-22 ENCOUNTER — Telehealth: Payer: Self-pay | Admitting: *Deleted

## 2019-03-22 NOTE — Telephone Encounter (Signed)

## 2019-03-23 DIAGNOSIS — I130 Hypertensive heart and chronic kidney disease with heart failure and stage 1 through stage 4 chronic kidney disease, or unspecified chronic kidney disease: Secondary | ICD-10-CM | POA: Diagnosis not present

## 2019-03-23 DIAGNOSIS — I5033 Acute on chronic diastolic (congestive) heart failure: Secondary | ICD-10-CM | POA: Diagnosis not present

## 2019-03-23 DIAGNOSIS — Z7952 Long term (current) use of systemic steroids: Secondary | ICD-10-CM | POA: Diagnosis not present

## 2019-03-23 DIAGNOSIS — I482 Chronic atrial fibrillation, unspecified: Secondary | ICD-10-CM | POA: Diagnosis not present

## 2019-03-23 DIAGNOSIS — N183 Chronic kidney disease, stage 3 (moderate): Secondary | ICD-10-CM | POA: Diagnosis not present

## 2019-03-23 DIAGNOSIS — Z9181 History of falling: Secondary | ICD-10-CM | POA: Diagnosis not present

## 2019-03-25 ENCOUNTER — Other Ambulatory Visit: Payer: Self-pay | Admitting: Family Medicine

## 2019-03-26 DIAGNOSIS — I130 Hypertensive heart and chronic kidney disease with heart failure and stage 1 through stage 4 chronic kidney disease, or unspecified chronic kidney disease: Secondary | ICD-10-CM | POA: Diagnosis not present

## 2019-03-26 DIAGNOSIS — I482 Chronic atrial fibrillation, unspecified: Secondary | ICD-10-CM | POA: Diagnosis not present

## 2019-03-26 DIAGNOSIS — Z9181 History of falling: Secondary | ICD-10-CM | POA: Diagnosis not present

## 2019-03-26 DIAGNOSIS — I5033 Acute on chronic diastolic (congestive) heart failure: Secondary | ICD-10-CM | POA: Diagnosis not present

## 2019-03-26 DIAGNOSIS — N183 Chronic kidney disease, stage 3 (moderate): Secondary | ICD-10-CM | POA: Diagnosis not present

## 2019-03-26 DIAGNOSIS — Z7952 Long term (current) use of systemic steroids: Secondary | ICD-10-CM | POA: Diagnosis not present

## 2019-03-27 ENCOUNTER — Other Ambulatory Visit: Payer: Self-pay

## 2019-03-27 ENCOUNTER — Ambulatory Visit (INDEPENDENT_AMBULATORY_CARE_PROVIDER_SITE_OTHER): Payer: Medicare Other

## 2019-03-27 DIAGNOSIS — D72829 Elevated white blood cell count, unspecified: Secondary | ICD-10-CM | POA: Diagnosis not present

## 2019-03-27 DIAGNOSIS — J181 Lobar pneumonia, unspecified organism: Secondary | ICD-10-CM

## 2019-03-27 DIAGNOSIS — J189 Pneumonia, unspecified organism: Secondary | ICD-10-CM | POA: Diagnosis not present

## 2019-03-27 DIAGNOSIS — R7989 Other specified abnormal findings of blood chemistry: Secondary | ICD-10-CM | POA: Diagnosis not present

## 2019-03-28 LAB — BASIC METABOLIC PANEL WITH GFR
BUN/Creatinine Ratio: 22 (calc) (ref 6–22)
BUN: 28 mg/dL — ABNORMAL HIGH (ref 7–25)
CO2: 33 mmol/L — ABNORMAL HIGH (ref 20–32)
Calcium: 9.2 mg/dL (ref 8.6–10.4)
Chloride: 104 mmol/L (ref 98–110)
Creat: 1.29 mg/dL — ABNORMAL HIGH (ref 0.60–0.93)
GFR, Est African American: 47 mL/min/{1.73_m2} — ABNORMAL LOW (ref >=60)
GFR, Est Non African American: 40 mL/min/{1.73_m2} — ABNORMAL LOW (ref >=60)
Glucose, Bld: 78 mg/dL (ref 65–99)
Potassium: 4.2 mmol/L (ref 3.5–5.3)
Sodium: 147 mmol/L — ABNORMAL HIGH (ref 135–146)

## 2019-03-28 LAB — CBC
HCT: 36 % (ref 35.0–45.0)
Hemoglobin: 12 g/dL (ref 11.7–15.5)
MCH: 34.3 pg — ABNORMAL HIGH (ref 27.0–33.0)
MCHC: 33.3 g/dL (ref 32.0–36.0)
MCV: 102.9 fL — ABNORMAL HIGH (ref 80.0–100.0)
MPV: 11 fL (ref 7.5–12.5)
Platelets: 221 10*3/uL (ref 140–400)
RBC: 3.5 10*6/uL — ABNORMAL LOW (ref 3.80–5.10)
RDW: 13 % (ref 11.0–15.0)
WBC: 11.4 10*3/uL — ABNORMAL HIGH (ref 3.8–10.8)

## 2019-03-29 ENCOUNTER — Other Ambulatory Visit: Payer: Self-pay

## 2019-03-29 ENCOUNTER — Encounter: Payer: Self-pay | Admitting: Family Medicine

## 2019-03-29 ENCOUNTER — Telehealth (INDEPENDENT_AMBULATORY_CARE_PROVIDER_SITE_OTHER): Payer: Medicare Other | Admitting: Cardiology

## 2019-03-29 ENCOUNTER — Telehealth: Payer: Self-pay | Admitting: *Deleted

## 2019-03-29 ENCOUNTER — Ambulatory Visit (INDEPENDENT_AMBULATORY_CARE_PROVIDER_SITE_OTHER): Payer: Medicare Other | Admitting: Family Medicine

## 2019-03-29 VITALS — BP 140/96 | HR 98 | Temp 96.6°F | Ht 65.0 in | Wt 245.0 lb

## 2019-03-29 DIAGNOSIS — F418 Other specified anxiety disorders: Secondary | ICD-10-CM

## 2019-03-29 DIAGNOSIS — J82 Pulmonary eosinophilia, not elsewhere classified: Secondary | ICD-10-CM | POA: Diagnosis not present

## 2019-03-29 DIAGNOSIS — I4891 Unspecified atrial fibrillation: Secondary | ICD-10-CM | POA: Diagnosis not present

## 2019-03-29 DIAGNOSIS — R600 Localized edema: Secondary | ICD-10-CM

## 2019-03-29 DIAGNOSIS — J8281 Chronic eosinophilic pneumonia: Secondary | ICD-10-CM

## 2019-03-29 MED ORDER — DILTIAZEM HCL 30 MG PO TABS: 30.0000 mg | ORAL_TABLET | Freq: Two times a day (BID) | ORAL | 1 refills | Status: DC

## 2019-03-29 MED ORDER — BUPROPION HCL ER (XL) 150 MG PO TB24: 150.0000 mg | ORAL_TABLET | ORAL | 2 refills | Status: DC

## 2019-03-29 MED ORDER — METOPROLOL TARTRATE 25 MG PO TABS: 12.5000 mg | ORAL_TABLET | Freq: Two times a day (BID) | ORAL | 1 refills | Status: DC

## 2019-03-29 MED ORDER — FERROUS SULFATE 325 (65 FE) MG PO TABS: 325.0000 mg | ORAL_TABLET | ORAL | 1 refills | Status: AC
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
## 2019-03-29 NOTE — Telephone Encounter (Signed)
[facility] Visit Initial Request  Agency Requested:    [facility] Contact:  Glory Buff, NP [place], [place] [address] Phone #:  [phone] Fax #:  [phone]  Patient Demographic Information: Name:  Nicole Norton Age:  78 y.o.   DOB:  10/15/1941  MRN:  [id number]   Address:   [address] [place] [address]   Phone Numbers:   Home Phone [phone]  Mobile [phone]     Emergency Contact Information on File:   Contact Information    Name Relation Home Work Mobile   [place] Spouse [phone]        The above family members may be contacted for information on this patient (review DPR on file):  Yes    Patient Clinical Information:  Primary Care Provider:  Hali Marry, MD  Primary Cardiologist:  No primary care provider on file.  Primary Electrophysiologist:  Will Meredith Leeds, MD   Requesting Provider:  Curt Bears    Past Medical Hx: Nicole Norton  has a past medical history of Allergy, Anxiety, Cardiac arrhythmia, DDD (degenerative disc disease) (1991 and 2002), Dysrhythmia, Hyperlipidemia, Hypertension, Hypothyroidism, Obesity, Pneumonia, and Thyroid disease.   Allergies: She is allergic to allopurinol; uloric [febuxostat]; ace inhibitors; colchicine; cymbalta [duloxetine hcl]; etodolac; omeprazole; paroxetine; penicillins; solu-medrol [methylprednisolone acetate]; statins; and lasix [furosemide].   Medications: Current Outpatient Medications on File Prior to Visit  Medication Sig  . AMBULATORY NON FORMULARY MEDICATION On continuous oxygen 2 L.  Stationary pulse ox at 88%.  Drops with activity.  Diagnosis restrictive lung disease with hypoxemia. Portable gas via nasal cannula. (Patient taking differently: On continuous oxygen 2 L.  Stationary pulse ox at 88%.  Drops with activity.  Diagnosis eosiniphilic pneumonia . Portable gas via nasal cannula.)  . cetirizine  (ZYRTEC) 10 MG tablet Take 10 mg by mouth as needed for allergies.   . cholecalciferol (VITAMIN D3) 25 MCG (1000 UT) tablet Take 1,000 Units by mouth daily.  [name] diltiazem (CARDIZEM) 30 MG tablet Take 1 tablet (30 mg total) by mouth every 12 (twelve) hours.  . ferrous sulfate 325 (65 FE) MG tablet Take 1 tablet (325 mg total) by mouth every Monday, Wednesday, and Friday.  . flecainide (TAMBOCOR) 100 MG tablet Take 1 tablet (100 mg total) by mouth 2 (two) times daily.  . furosemide (LASIX) 40 MG tablet TAKE ONE TABLET BY MOUTH DAILY ON MONDAY, WEDNESDAY AND FRIDAY FOR EDEMA  . lansoprazole (PREVACID) 30 MG capsule Take 1 capsule (30 mg total) by mouth daily. Intolerant to omeprazole.  On chronic steroids.  [name] levothyroxine (SYNTHROID, LEVOTHROID) 200 MCG tablet Take 1 tablet (200 mcg total) by mouth daily before breakfast. (Patient taking differently: Take 200 mcg by mouth daily before breakfast. Taking with 68mcg tablet = total dose 224mcg)  . levothyroxine (SYNTHROID, LEVOTHROID) 25 MCG tablet TAKE ONE TABLET EVERY DAY BEFORE BREAKFAST (Patient taking differently: Take 25 mcg by mouth. Taking with 241mcg tablet for total = 272mcg)  . lubiprostone (AMITIZA) 24 MCG capsule Take 24 mcg by mouth 2 (two) times daily with a meal.  . Mepolizumab (NUCALA) 100 MG/ML SOAJ Inject into the skin.  . metoprolol tartrate (LOPRESSOR) 25 MG tablet Take 0.5 tablets (12.5 mg total) by mouth 2 (two) times daily.  . Multiple Vitamins-Minerals (CENTRUM SILVER PO) Take 1 tablet by mouth daily.   . Omega-3 Fatty Acids (FISH OIL) 1200 MG CAPS Take 1,200 mg by mouth daily.   [name]  Oxycodone HCl 10 MG TABS Take 10 mg by mouth 4 (four) times daily.  . predniSONE (DELTASONE) 20 MG tablet Take 40 mg by mouth daily with breakfast.   . pregabalin (LYRICA) 75 MG capsule Take 75 mg by mouth 3 (three) times daily.  [name] PREMARIN 0.625 MG tablet TAKE 1 TABLET DAILY AS NEEDED  . rivaroxaban (XARELTO) 20 MG TABS tablet Take 1 tablet (20 mg  total) by mouth daily with supper.  . sucralfate (CARAFATE) 1 GM/10ML suspension Take 10 mLs (1 g total) by mouth as needed. (Patient taking differently: Take 1 g by mouth as needed (heartburn). )  . vitamin B-12 (CYANOCOBALAMIN) 500 MCG tablet Take 500 mcg by mouth every Monday, Wednesday, and Friday.    No current facility-administered medications on file prior to visit.      Social Hx: She  reports that she quit smoking about 19 years ago. Her smoking use included cigarettes. She has a 20.00 pack-year smoking history. She has never used smokeless tobacco. She reports current alcohol use of about 6.0 standard drinks of alcohol per week. She reports that she does not use drugs.    Diagnosis/Reason for Visit:   AFib  Services Requested:  [employer] Loss adjuster, chartered)  # of Visits Needed/Frequency per Week: 1  A copy of the office note will be faxed with this form.

## 2019-03-29 NOTE — Progress Notes (Signed)
Virtual Visit via Video Note  I connected with Nicole Norton on 03/30/19 at 11:10 AM EDT by a video enabled telemedicine application and verified that I am speaking with the correct person using two identifiers.   I discussed the limitations of evaluation and management by telemedicine and the availability of in person appointments. The patient expressed understanding and agreed to proceed.  Subjective:    CC: f/u edema   HPI:  Follow-up for chronic lower extremity edema-exacerbated by chronic steroid use.  They had adjusted her Lasix down to just 1 tab on Monday Wednesdays and Fridays.  She weighed 239 pounds and had gone up to 246 pounds and so then call the office because of a 7 pound weight gain.  She is also noticed that the swelling in her lower extremities had worsened and she had been wearing her TED hose.  We decided to have her take her medication for 2 days in a row and then skip 1 day.  She says it has been going up and down, etc. She had a telemedicine visit w/Dr. Curt Bears, Cardiology,  today he will be increasing her Lasix to 80 mg for the next 3 days. Dr. Curt Bears wants to consider a Cardioversion and is sending someone out to do an EKG.    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  She is hopeful that she might be able to decrease down to even 35 or 30 mg in hopes that that would help with some of her swelling she feels like especially around her face that the higher dose of steroids is contributing.  She has been on a higher dose since they are trying to get her Nucala approved with the insurance.  Depression with anxiety-overall her mood is doing well.  No specific concerns.  Past medical history, Surgical history, Family history not pertinant except as noted below, Social history, Allergies, and medications have been entered into the medical record, reviewed, and corrections made.   Review of Systems: No fevers, chills, night sweats, weight loss, chest  pain, or shortness of breath.   Objective:    General: Speaking clearly in complete sentences without any shortness of breath.  Alert and oriented x3.  Normal judgment. No apparent acute distress.    Impression and Recommendations:   Chronic lower extremity edema-she has a very fine balance between controlling the edema and overdrying her kidneys. She has bruising on her legs where she swells so much.  She is going to take 80mg  for 3 days in a row, then go back to 40mg  on for 2 days and off for one days.  10 you to monitor daily weights.  Depression with anxiety- PHQ - 9 score of 6. GAD - 7 score of 4.  We will continue with current regimen.  Follow-up in 6 months or sooner if she feels like she is having an exacerbation of her symptoms.  Afib with RVR -  scheduled for EKG with cardiology to see if the A. fib could actually be worsening her lower extremity edema.  They might consider a cardiac ablation if so..    Idiopathic chronic eosinophilic pneumonia - Still trying to get her Nucala approved. Has appt coming up.  Now on 40mg  of prednisone.   I discussed the assessment and treatment plan with the patient. The patient was provided an opportunity to ask questions and all were answered. The patient agreed with the plan and demonstrated an understanding of the instructions.   The patient  was advised to call back or seek an in-person evaluation if the symptoms worsen or if the condition fails to improve as anticipated.   Beatrice Lecher, MD

## 2019-03-29 NOTE — Progress Notes (Signed)
She had a telemedicine visit w/Dr. Curt Bears today he will be increasing her Lasix to 80 mg for the next 3 days.  She will also need refills on Diltiazem 30 mg, Ferrous sulfate 325 (rx doesn't cause stomach upset), metoprolol 12.5. she said these did not get refilled after her d/c .Elouise Munroe, CMA

## 2019-03-30 ENCOUNTER — Encounter: Payer: Self-pay | Admitting: Family Medicine

## 2019-03-30 DIAGNOSIS — N183 Chronic kidney disease, stage 3 (moderate): Secondary | ICD-10-CM | POA: Diagnosis not present

## 2019-03-30 DIAGNOSIS — I130 Hypertensive heart and chronic kidney disease with heart failure and stage 1 through stage 4 chronic kidney disease, or unspecified chronic kidney disease: Secondary | ICD-10-CM | POA: Diagnosis not present

## 2019-03-30 DIAGNOSIS — Z7952 Long term (current) use of systemic steroids: Secondary | ICD-10-CM | POA: Diagnosis not present

## 2019-03-30 DIAGNOSIS — I5033 Acute on chronic diastolic (congestive) heart failure: Secondary | ICD-10-CM | POA: Diagnosis not present

## 2019-03-30 DIAGNOSIS — Z9181 History of falling: Secondary | ICD-10-CM | POA: Diagnosis not present

## 2019-03-30 DIAGNOSIS — I482 Chronic atrial fibrillation, unspecified: Secondary | ICD-10-CM | POA: Diagnosis not present

## 2019-04-06 DIAGNOSIS — I130 Hypertensive heart and chronic kidney disease with heart failure and stage 1 through stage 4 chronic kidney disease, or unspecified chronic kidney disease: Secondary | ICD-10-CM | POA: Diagnosis not present

## 2019-04-06 DIAGNOSIS — I482 Chronic atrial fibrillation, unspecified: Secondary | ICD-10-CM | POA: Diagnosis not present

## 2019-04-06 DIAGNOSIS — N183 Chronic kidney disease, stage 3 (moderate): Secondary | ICD-10-CM | POA: Diagnosis not present

## 2019-04-06 DIAGNOSIS — I5033 Acute on chronic diastolic (congestive) heart failure: Secondary | ICD-10-CM | POA: Diagnosis not present

## 2019-04-06 DIAGNOSIS — Z9181 History of falling: Secondary | ICD-10-CM | POA: Diagnosis not present

## 2019-04-06 DIAGNOSIS — Z7952 Long term (current) use of systemic steroids: Secondary | ICD-10-CM | POA: Diagnosis not present

## 2019-04-07 DIAGNOSIS — N183 Chronic kidney disease, stage 3 (moderate): Secondary | ICD-10-CM | POA: Diagnosis not present

## 2019-04-07 DIAGNOSIS — Z7952 Long term (current) use of systemic steroids: Secondary | ICD-10-CM | POA: Diagnosis not present

## 2019-04-07 DIAGNOSIS — Z9181 History of falling: Secondary | ICD-10-CM | POA: Diagnosis not present

## 2019-04-07 DIAGNOSIS — I5033 Acute on chronic diastolic (congestive) heart failure: Secondary | ICD-10-CM | POA: Diagnosis not present

## 2019-04-07 DIAGNOSIS — I130 Hypertensive heart and chronic kidney disease with heart failure and stage 1 through stage 4 chronic kidney disease, or unspecified chronic kidney disease: Secondary | ICD-10-CM | POA: Diagnosis not present

## 2019-04-07 DIAGNOSIS — I482 Chronic atrial fibrillation, unspecified: Secondary | ICD-10-CM | POA: Diagnosis not present

## 2019-04-11 DIAGNOSIS — Z7952 Long term (current) use of systemic steroids: Secondary | ICD-10-CM | POA: Diagnosis not present

## 2019-04-11 DIAGNOSIS — I5033 Acute on chronic diastolic (congestive) heart failure: Secondary | ICD-10-CM | POA: Diagnosis not present

## 2019-04-11 DIAGNOSIS — I482 Chronic atrial fibrillation, unspecified: Secondary | ICD-10-CM | POA: Diagnosis not present

## 2019-04-11 DIAGNOSIS — N183 Chronic kidney disease, stage 3 (moderate): Secondary | ICD-10-CM | POA: Diagnosis not present

## 2019-04-11 DIAGNOSIS — Z9181 History of falling: Secondary | ICD-10-CM | POA: Diagnosis not present

## 2019-04-11 DIAGNOSIS — I130 Hypertensive heart and chronic kidney disease with heart failure and stage 1 through stage 4 chronic kidney disease, or unspecified chronic kidney disease: Secondary | ICD-10-CM | POA: Diagnosis not present

## 2019-04-12 DIAGNOSIS — H524 Presbyopia: Secondary | ICD-10-CM | POA: Diagnosis not present

## 2019-04-12 DIAGNOSIS — D3132 Benign neoplasm of left choroid: Secondary | ICD-10-CM | POA: Diagnosis not present

## 2019-04-12 DIAGNOSIS — H26492 Other secondary cataract, left eye: Secondary | ICD-10-CM | POA: Diagnosis not present

## 2019-04-16 ENCOUNTER — Telehealth: Payer: Self-pay

## 2019-04-16 DIAGNOSIS — R600 Localized edema: Secondary | ICD-10-CM

## 2019-04-16 DIAGNOSIS — R7989 Other specified abnormal findings of blood chemistry: Secondary | ICD-10-CM

## 2019-04-16 NOTE — Telephone Encounter (Signed)
Nicole Norton called and states she is having back pain on and off and is worried it is her kidneys. She has been taking more lasix, cardiologist advised. She would like to have some labs done to check her kidneys. Please advise.

## 2019-04-16 NOTE — Telephone Encounter (Signed)
Pt advised. Going to lab tomorrow

## 2019-04-16 NOTE — Telephone Encounter (Signed)
OK, labs sent. She can go anytime.

## 2019-04-17 DIAGNOSIS — I5033 Acute on chronic diastolic (congestive) heart failure: Secondary | ICD-10-CM | POA: Diagnosis not present

## 2019-04-17 DIAGNOSIS — R600 Localized edema: Secondary | ICD-10-CM | POA: Diagnosis not present

## 2019-04-17 DIAGNOSIS — I130 Hypertensive heart and chronic kidney disease with heart failure and stage 1 through stage 4 chronic kidney disease, or unspecified chronic kidney disease: Secondary | ICD-10-CM | POA: Diagnosis not present

## 2019-04-17 DIAGNOSIS — Z7952 Long term (current) use of systemic steroids: Secondary | ICD-10-CM | POA: Diagnosis not present

## 2019-04-17 DIAGNOSIS — N183 Chronic kidney disease, stage 3 (moderate): Secondary | ICD-10-CM | POA: Diagnosis not present

## 2019-04-17 DIAGNOSIS — I482 Chronic atrial fibrillation, unspecified: Secondary | ICD-10-CM | POA: Diagnosis not present

## 2019-04-17 DIAGNOSIS — R7989 Other specified abnormal findings of blood chemistry: Secondary | ICD-10-CM | POA: Diagnosis not present

## 2019-04-17 DIAGNOSIS — Z9181 History of falling: Secondary | ICD-10-CM | POA: Diagnosis not present

## 2019-04-18 DIAGNOSIS — M47817 Spondylosis without myelopathy or radiculopathy, lumbosacral region: Secondary | ICD-10-CM | POA: Diagnosis not present

## 2019-04-18 DIAGNOSIS — M170 Bilateral primary osteoarthritis of knee: Secondary | ICD-10-CM | POA: Diagnosis not present

## 2019-04-18 DIAGNOSIS — G894 Chronic pain syndrome: Secondary | ICD-10-CM | POA: Diagnosis not present

## 2019-04-18 DIAGNOSIS — M47812 Spondylosis without myelopathy or radiculopathy, cervical region: Secondary | ICD-10-CM | POA: Diagnosis not present

## 2019-04-18 LAB — COMPLETE METABOLIC PANEL WITH GFR
AG Ratio: 1.8 (calc) (ref 1.0–2.5)
ALT: 30 U/L — ABNORMAL HIGH (ref 6–29)
AST: 24 U/L (ref 10–35)
Albumin: 3.8 g/dL (ref 3.6–5.1)
Alkaline phosphatase (APISO): 84 U/L (ref 37–153)
BUN/Creatinine Ratio: 26 (calc) — ABNORMAL HIGH (ref 6–22)
BUN: 33 mg/dL — ABNORMAL HIGH (ref 7–25)
CO2: 31 mmol/L (ref 20–32)
Calcium: 9.6 mg/dL (ref 8.6–10.4)
Chloride: 104 mmol/L (ref 98–110)
Creat: 1.28 mg/dL — ABNORMAL HIGH (ref 0.60–0.93)
GFR, Est African American: 47 mL/min/{1.73_m2} — ABNORMAL LOW (ref >=60)
GFR, Est Non African American: 41 mL/min/{1.73_m2} — ABNORMAL LOW (ref >=60)
Globulin: 2.1 g/dL (calc) (ref 1.9–3.7)
Glucose, Bld: 94 mg/dL (ref 65–99)
Potassium: 4.2 mmol/L (ref 3.5–5.3)
Sodium: 145 mmol/L (ref 135–146)
Total Bilirubin: 0.4 mg/dL (ref 0.2–1.2)
Total Protein: 5.9 g/dL — ABNORMAL LOW (ref 6.1–8.1)

## 2019-04-18 LAB — CBC
HCT: 40.2 % (ref 35.0–45.0)
Hemoglobin: 12.8 g/dL (ref 11.7–15.5)
MCH: 32.9 pg (ref 27.0–33.0)
MCHC: 31.8 g/dL — ABNORMAL LOW (ref 32.0–36.0)
MCV: 103.3 fL — ABNORMAL HIGH (ref 80.0–100.0)
MPV: 11.2 fL (ref 7.5–12.5)
Platelets: 234 10*3/uL (ref 140–400)
RBC: 3.89 10*6/uL (ref 3.80–5.10)
RDW: 12.4 % (ref 11.0–15.0)
WBC: 10.1 10*3/uL (ref 3.8–10.8)

## 2019-04-19 ENCOUNTER — Other Ambulatory Visit: Payer: Self-pay

## 2019-04-19 ENCOUNTER — Telehealth: Payer: Self-pay | Admitting: Cardiology

## 2019-04-19 DIAGNOSIS — I482 Chronic atrial fibrillation, unspecified: Secondary | ICD-10-CM | POA: Diagnosis not present

## 2019-04-19 DIAGNOSIS — I130 Hypertensive heart and chronic kidney disease with heart failure and stage 1 through stage 4 chronic kidney disease, or unspecified chronic kidney disease: Secondary | ICD-10-CM | POA: Diagnosis not present

## 2019-04-19 DIAGNOSIS — N183 Chronic kidney disease, stage 3 (moderate): Secondary | ICD-10-CM | POA: Diagnosis not present

## 2019-04-19 DIAGNOSIS — I5033 Acute on chronic diastolic (congestive) heart failure: Secondary | ICD-10-CM | POA: Diagnosis not present

## 2019-04-19 DIAGNOSIS — Z9181 History of falling: Secondary | ICD-10-CM | POA: Diagnosis not present

## 2019-04-19 DIAGNOSIS — Z7952 Long term (current) use of systemic steroids: Secondary | ICD-10-CM | POA: Diagnosis not present

## 2019-04-19 MED ORDER — LEVOTHYROXINE SODIUM 200 MCG PO TABS: 200.0000 ug | ORAL_TABLET | Freq: Every day | ORAL | 1 refills | Status: DC

## 2019-04-19 NOTE — Telephone Encounter (Signed)
Called and spoke w/ pt who reports weight gain/swelling of 22-30 pds in last month. States that Dr. Curt Bears had her take 80 mg BID for 3 days, but this is going to work.  States that for one she gained the 7 pds lost right back after going back to normal dosing of 40 mg.  Pt states she had lots of back pain on increased Lasix and will not go through that again.   Reports BLEE. Pt reports that she did have EKG from our [facility] order.   Pt aware I will follow up for EKG result and discuss w/ Camnitz. Aware I will call back by [email or web address]

## 2019-04-19 NOTE — Telephone Encounter (Signed)
EKG in chart (under 03/30/19 media tab) Will have Camnitz review for DCCV advisement and diuretic recommendation.

## 2019-04-19 NOTE — Telephone Encounter (Signed)
New   Pt c/o swelling: STAT is pt has developed SOB within 24 hours  How much weight have you gained and in what time span? 12lbs in one month 1) If swelling, where is the swelling located? Everywhere.   2) Are you currently taking a fluid pill? no  3) Are you currently SOB? Yes  4) Do you have a log of your daily weights (if so, list)? Not available   5) Have you gained 3 pounds in a day or 5 pounds in a week? unknown  6) Have you traveled recently? no  Barnetta Chapel with [facility] is calling because patient has increased shortness of breath. As well as since being home from hospital has went from 239-251 in a month. Please call to discuss.

## 2019-04-20 NOTE — Telephone Encounter (Signed)
Advised that Dr. Curt Bears recommends ASAP DCCV. Pt aware I will f/u beginning of next week to arrange. Will get fluid retention advisement from [facility] next week. Pt will call over weekend, if fluid retention continues to worsen, for advisement until [facility] can review.

## 2019-04-24 ENCOUNTER — Ambulatory Visit (INDEPENDENT_AMBULATORY_CARE_PROVIDER_SITE_OTHER): Payer: Medicare Other | Admitting: Family Medicine

## 2019-04-24 ENCOUNTER — Encounter: Payer: Self-pay | Admitting: Family Medicine

## 2019-04-24 VITALS — BP 148/88 | HR 93 | Temp 98.1°F | Ht 65.0 in | Wt 258.0 lb

## 2019-04-24 DIAGNOSIS — R600 Localized edema: Secondary | ICD-10-CM | POA: Diagnosis not present

## 2019-04-24 DIAGNOSIS — I4891 Unspecified atrial fibrillation: Secondary | ICD-10-CM | POA: Diagnosis not present

## 2019-04-24 DIAGNOSIS — R0602 Shortness of breath: Secondary | ICD-10-CM

## 2019-04-24 MED ORDER — FUROSEMIDE 10 MG/ML IJ SOLN
60.0000 mg | Freq: Once | INTRAMUSCULAR | Status: AC
  Administered 2019-04-24: 60 mg via INTRAMUSCULAR

## 2019-04-24 MED ORDER — FUROSEMIDE 8 MG/ML PO SOLN: 60.0000 mg | Freq: Once | ORAL | Status: DC

## 2019-04-24 MED ORDER — BUMETANIDE 1 MG PO TABS: 1.0000 mg | ORAL_TABLET | Freq: Every day | ORAL | 1 refills | Status: DC
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
## 2019-04-24 NOTE — Telephone Encounter (Signed)
Pt reports her weight gain/swelling hasn't gotten worse, but staying steady. Swelling has come down "a tad bit".  She backed down on her Prednisone also and has noticed an improvement. Pt states that she cannot go for DCCV soon d/t she cannot drive herself and her husband is undergoing some testing. Pt understands I will see about scheduling rapid COVID screening w/ DCCV next day as getting this sooner, rather than later, would be preferred. We will follow up this week to arrange DCCV this month.   (She cannot go on 6/18, 6/25 or  6/29). Pt agreeable to plan.

## 2019-04-24 NOTE — Patient Instructions (Signed)
Hold your furosemide.  I will send over a script for Bumex to use inseated.    Weight yourself each morning.

## 2019-04-26 DIAGNOSIS — G4734 Idiopathic sleep related nonobstructive alveolar hypoventilation: Secondary | ICD-10-CM | POA: Diagnosis not present

## 2019-04-26 DIAGNOSIS — I509 Heart failure, unspecified: Secondary | ICD-10-CM | POA: Insufficient documentation

## 2019-04-26 DIAGNOSIS — M301 Polyarteritis with lung involvement [Churg-Strauss]: Secondary | ICD-10-CM | POA: Diagnosis not present

## 2019-04-26 DIAGNOSIS — I482 Chronic atrial fibrillation, unspecified: Secondary | ICD-10-CM | POA: Diagnosis not present

## 2019-04-30 NOTE — Telephone Encounter (Signed)
Pt received bilateral Lasix hip injections last week at PCP. She was also taken off Lasix and started on Bumex 1 mg daily. Pt is taking Bumex 2mg  daily as ok'd per PCP. Pt has also decreased her Prednisone from 40 mg to 20 mg daily. Pt has lost 8 1/2 obs over last week. She went from 250 to now 241.5 lbs. Reviewed w/ Dr. Curt Bears -- order to continue to diurese over the next week and follow up w/ pt next week to ensure continued diuresing and schedule DCCV. Pt agreeable to plan and understands I will follow up next week.

## 2019-05-03 ENCOUNTER — Other Ambulatory Visit: Payer: Self-pay | Admitting: Physical Medicine and Rehabilitation

## 2019-05-03 DIAGNOSIS — M47817 Spondylosis without myelopathy or radiculopathy, lumbosacral region: Secondary | ICD-10-CM

## 2019-05-03 DIAGNOSIS — M5416 Radiculopathy, lumbar region: Secondary | ICD-10-CM

## 2019-05-04 DIAGNOSIS — Z7952 Long term (current) use of systemic steroids: Secondary | ICD-10-CM | POA: Diagnosis not present

## 2019-05-04 DIAGNOSIS — I482 Chronic atrial fibrillation, unspecified: Secondary | ICD-10-CM | POA: Diagnosis not present

## 2019-05-04 DIAGNOSIS — Z9181 History of falling: Secondary | ICD-10-CM | POA: Diagnosis not present

## 2019-05-04 DIAGNOSIS — I130 Hypertensive heart and chronic kidney disease with heart failure and stage 1 through stage 4 chronic kidney disease, or unspecified chronic kidney disease: Secondary | ICD-10-CM | POA: Diagnosis not present

## 2019-05-04 DIAGNOSIS — N183 Chronic kidney disease, stage 3 (moderate): Secondary | ICD-10-CM | POA: Diagnosis not present

## 2019-05-04 DIAGNOSIS — I5033 Acute on chronic diastolic (congestive) heart failure: Secondary | ICD-10-CM | POA: Diagnosis not present

## 2019-05-10 ENCOUNTER — Inpatient Hospital Stay (HOSPITAL_COMMUNITY)
Admission: EM | Admit: 2019-05-10 | Discharge: 2019-05-17 | DRG: 291 | Disposition: A | Discharge status: Home / Self Care | Payer: Medicare Other | Attending: Cardiovascular Disease | Admitting: Cardiovascular Disease

## 2019-05-10 ENCOUNTER — Emergency Department (HOSPITAL_COMMUNITY): Payer: Medicare Other

## 2019-05-10 ENCOUNTER — Encounter (HOSPITAL_COMMUNITY): Payer: Self-pay | Admitting: Cardiology

## 2019-05-10 ENCOUNTER — Other Ambulatory Visit: Payer: Self-pay

## 2019-05-10 DIAGNOSIS — J9621 Acute and chronic respiratory failure with hypoxia: Secondary | ICD-10-CM

## 2019-05-10 DIAGNOSIS — Z7952 Long term (current) use of systemic steroids: Secondary | ICD-10-CM

## 2019-05-10 DIAGNOSIS — Z79890 Hormone replacement therapy (postmenopausal): Secondary | ICD-10-CM | POA: Diagnosis not present

## 2019-05-10 DIAGNOSIS — E669 Obesity, unspecified: Secondary | ICD-10-CM | POA: Diagnosis present

## 2019-05-10 DIAGNOSIS — Z79899 Other long term (current) drug therapy: Secondary | ICD-10-CM | POA: Diagnosis not present

## 2019-05-10 DIAGNOSIS — Z7901 Long term (current) use of anticoagulants: Secondary | ICD-10-CM

## 2019-05-10 DIAGNOSIS — I4891 Unspecified atrial fibrillation: Secondary | ICD-10-CM | POA: Diagnosis present

## 2019-05-10 DIAGNOSIS — Z87891 Personal history of nicotine dependence: Secondary | ICD-10-CM | POA: Diagnosis not present

## 2019-05-10 DIAGNOSIS — I10 Essential (primary) hypertension: Secondary | ICD-10-CM | POA: Diagnosis present

## 2019-05-10 DIAGNOSIS — Z79891 Long term (current) use of opiate analgesic: Secondary | ICD-10-CM

## 2019-05-10 DIAGNOSIS — E785 Hyperlipidemia, unspecified: Secondary | ICD-10-CM | POA: Diagnosis present

## 2019-05-10 DIAGNOSIS — I5021 Acute systolic (congestive) heart failure: Secondary | ICD-10-CM | POA: Diagnosis not present

## 2019-05-10 DIAGNOSIS — I5033 Acute on chronic diastolic (congestive) heart failure: Secondary | ICD-10-CM | POA: Diagnosis present

## 2019-05-10 DIAGNOSIS — Z9981 Dependence on supplemental oxygen: Secondary | ICD-10-CM | POA: Diagnosis not present

## 2019-05-10 DIAGNOSIS — I4819 Other persistent atrial fibrillation: Secondary | ICD-10-CM | POA: Diagnosis present

## 2019-05-10 DIAGNOSIS — Z20828 Contact with and (suspected) exposure to other viral communicable diseases: Secondary | ICD-10-CM | POA: Diagnosis not present

## 2019-05-10 DIAGNOSIS — Z1159 Encounter for screening for other viral diseases: Secondary | ICD-10-CM | POA: Diagnosis not present

## 2019-05-10 DIAGNOSIS — I5043 Acute on chronic combined systolic (congestive) and diastolic (congestive) heart failure: Secondary | ICD-10-CM

## 2019-05-10 DIAGNOSIS — I340 Nonrheumatic mitral (valve) insufficiency: Secondary | ICD-10-CM | POA: Diagnosis not present

## 2019-05-10 DIAGNOSIS — Z6841 Body Mass Index (BMI) 40.0 and over, adult: Secondary | ICD-10-CM | POA: Diagnosis not present

## 2019-05-10 DIAGNOSIS — K219 Gastro-esophageal reflux disease without esophagitis: Secondary | ICD-10-CM | POA: Diagnosis present

## 2019-05-10 DIAGNOSIS — N183 Chronic kidney disease, stage 3 unspecified: Secondary | ICD-10-CM | POA: Diagnosis present

## 2019-05-10 DIAGNOSIS — G8929 Other chronic pain: Secondary | ICD-10-CM | POA: Diagnosis present

## 2019-05-10 DIAGNOSIS — I248 Other forms of acute ischemic heart disease: Secondary | ICD-10-CM | POA: Diagnosis present

## 2019-05-10 DIAGNOSIS — I482 Chronic atrial fibrillation, unspecified: Secondary | ICD-10-CM | POA: Diagnosis not present

## 2019-05-10 DIAGNOSIS — E039 Hypothyroidism, unspecified: Secondary | ICD-10-CM | POA: Diagnosis present

## 2019-05-10 DIAGNOSIS — I5023 Acute on chronic systolic (congestive) heart failure: Secondary | ICD-10-CM | POA: Diagnosis not present

## 2019-05-10 DIAGNOSIS — R079 Chest pain, unspecified: Secondary | ICD-10-CM | POA: Diagnosis not present

## 2019-05-10 DIAGNOSIS — R7989 Other specified abnormal findings of blood chemistry: Secondary | ICD-10-CM | POA: Diagnosis not present

## 2019-05-10 DIAGNOSIS — E876 Hypokalemia: Secondary | ICD-10-CM | POA: Diagnosis present

## 2019-05-10 DIAGNOSIS — Z90710 Acquired absence of both cervix and uterus: Secondary | ICD-10-CM | POA: Diagnosis not present

## 2019-05-10 DIAGNOSIS — E7800 Pure hypercholesterolemia, unspecified: Secondary | ICD-10-CM | POA: Diagnosis present

## 2019-05-10 DIAGNOSIS — R0602 Shortness of breath: Secondary | ICD-10-CM | POA: Diagnosis not present

## 2019-05-10 DIAGNOSIS — I480 Paroxysmal atrial fibrillation: Secondary | ICD-10-CM | POA: Diagnosis not present

## 2019-05-10 DIAGNOSIS — I509 Heart failure, unspecified: Secondary | ICD-10-CM | POA: Diagnosis not present

## 2019-05-10 DIAGNOSIS — J9620 Acute and chronic respiratory failure, unspecified whether with hypoxia or hypercapnia: Secondary | ICD-10-CM | POA: Diagnosis not present

## 2019-05-10 DIAGNOSIS — I4811 Longstanding persistent atrial fibrillation: Secondary | ICD-10-CM | POA: Diagnosis not present

## 2019-05-10 DIAGNOSIS — I130 Hypertensive heart and chronic kidney disease with heart failure and stage 1 through stage 4 chronic kidney disease, or unspecified chronic kidney disease: Secondary | ICD-10-CM | POA: Diagnosis present

## 2019-05-10 DIAGNOSIS — I110 Hypertensive heart disease with heart failure: Secondary | ICD-10-CM | POA: Diagnosis not present

## 2019-05-10 DIAGNOSIS — I361 Nonrheumatic tricuspid (valve) insufficiency: Secondary | ICD-10-CM | POA: Diagnosis not present

## 2019-05-10 HISTORY — DX: Other persistent atrial fibrillation: I48.19

## 2019-05-10 LAB — POCT I-STAT EG7
Acid-Base Excess: 5 mmol/L — ABNORMAL HIGH (ref 0.0–2.0)
Bicarbonate: 29.5 mmol/L — ABNORMAL HIGH (ref 20.0–28.0)
Calcium, Ion: 1.18 mmol/L (ref 1.15–1.40)
HCT: 41 % (ref 36.0–46.0)
Hemoglobin: 13.9 g/dL (ref 12.0–15.0)
O2 Saturation: 99 %
Potassium: 3.5 mmol/L (ref 3.5–5.1)
Sodium: 146 mmol/L — ABNORMAL HIGH (ref 135–145)
TCO2: 31 mmol/L (ref 22–32)
pCO2, Ven: 44 mmHg (ref 44.0–60.0)
pH, Ven: 7.434 — ABNORMAL HIGH (ref 7.250–7.430)
pO2, Ven: 135 mmHg — ABNORMAL HIGH (ref 32.0–45.0)

## 2019-05-10 LAB — BASIC METABOLIC PANEL
Anion gap: 14 (ref 5–15)
BUN: 29 mg/dL — ABNORMAL HIGH (ref 8–23)
CO2: 26 mmol/L (ref 22–32)
Calcium: 9.7 mg/dL (ref 8.9–10.3)
Chloride: 107 mmol/L (ref 98–111)
Creatinine, Ser: 1.22 mg/dL — ABNORMAL HIGH (ref 0.44–1.00)
GFR calc Af Amer: 50 mL/min — ABNORMAL LOW (ref >60)
GFR calc non Af Amer: 43 mL/min — ABNORMAL LOW (ref >60)
Glucose, Bld: 121 mg/dL — ABNORMAL HIGH (ref 70–99)
Potassium: 3.5 mmol/L (ref 3.5–5.1)
Sodium: 147 mmol/L — ABNORMAL HIGH (ref 135–145)

## 2019-05-10 LAB — CBC
HCT: 43.6 % (ref 36.0–46.0)
Hemoglobin: 13.4 g/dL (ref 12.0–15.0)
MCH: 32 pg (ref 26.0–34.0)
MCHC: 30.7 g/dL (ref 30.0–36.0)
MCV: 104.1 fL — ABNORMAL HIGH (ref 80.0–100.0)
Platelets: 262 10*3/uL (ref 150–400)
RBC: 4.19 MIL/uL (ref 3.87–5.11)
RDW: 13.1 % (ref 11.5–15.5)
WBC: 14.8 10*3/uL — ABNORMAL HIGH (ref 4.0–10.5)
nRBC: 0 % (ref 0.0–0.2)

## 2019-05-10 LAB — TROPONIN I (HIGH SENSITIVITY)
Troponin I (High Sensitivity): 141 ng/L (ref <18)
Troponin I (High Sensitivity): 365 ng/L (ref <18)

## 2019-05-10 LAB — BRAIN NATRIURETIC PEPTIDE: B Natriuretic Peptide: 773.4 pg/mL — ABNORMAL HIGH (ref 0.0–100.0)

## 2019-05-10 LAB — SARS CORONAVIRUS 2 BY RT PCR (HOSPITAL ORDER, PERFORMED IN ~~LOC~~ HOSPITAL LAB): SARS Coronavirus 2: NEGATIVE

## 2019-05-10 MED ORDER — SODIUM CHLORIDE 0.9 % IV SOLN
250.0000 mL | INTRAVENOUS | Status: DC | PRN
  Administered 2019-05-14: 250 mL via INTRAVENOUS

## 2019-05-10 MED ORDER — MORPHINE SULFATE (PF) 2 MG/ML IV SOLN
2.0000 mg | Freq: Once | INTRAVENOUS | Status: AC
  Administered 2019-05-10: 2 mg via INTRAVENOUS
  Filled 2019-05-10: qty 1

## 2019-05-10 MED ORDER — FUROSEMIDE 10 MG/ML IJ SOLN
60.0000 mg | Freq: Two times a day (BID) | INTRAMUSCULAR | Status: DC
  Administered 2019-05-10 – 2019-05-13 (×7): 60 mg via INTRAVENOUS
  Filled 2019-05-10 (×8): qty 6

## 2019-05-10 MED ORDER — PANTOPRAZOLE SODIUM 40 MG PO TBEC
40.0000 mg | DELAYED_RELEASE_TABLET | Freq: Every day | ORAL | Status: DC
  Administered 2019-05-11 – 2019-05-17 (×7): 40 mg via ORAL
  Filled 2019-05-10 (×7): qty 1

## 2019-05-10 MED ORDER — SODIUM CHLORIDE 0.9% FLUSH: 3.0000 mL | INTRAVENOUS | Status: DC | PRN

## 2019-05-10 MED ORDER — SODIUM CHLORIDE 0.9% FLUSH
3.0000 mL | Freq: Two times a day (BID) | INTRAVENOUS | Status: DC
  Administered 2019-05-10 – 2019-05-17 (×12): 3 mL via INTRAVENOUS

## 2019-05-10 MED ORDER — RIVAROXABAN 20 MG PO TABS
20.0000 mg | ORAL_TABLET | Freq: Every day | ORAL | Status: DC
  Administered 2019-05-10 – 2019-05-16 (×7): 20 mg via ORAL
  Filled 2019-05-10 (×7): qty 1

## 2019-05-10 MED ORDER — ALBUTEROL SULFATE HFA 108 (90 BASE) MCG/ACT IN AERS
2.0000 | INHALATION_SPRAY | Freq: Once | RESPIRATORY_TRACT | Status: AC
  Administered 2019-05-10: 2 via RESPIRATORY_TRACT
  Filled 2019-05-10: qty 6.7

## 2019-05-10 MED ORDER — FLECAINIDE ACETATE 100 MG PO TABS
100.0000 mg | ORAL_TABLET | Freq: Two times a day (BID) | ORAL | Status: DC
  Administered 2019-05-10 – 2019-05-15 (×11): 100 mg via ORAL
  Filled 2019-05-10 (×12): qty 1

## 2019-05-10 MED ORDER — ACETAMINOPHEN 325 MG PO TABS
650.0000 mg | ORAL_TABLET | ORAL | Status: DC | PRN
  Administered 2019-05-11 – 2019-05-17 (×4): 650 mg via ORAL
  Filled 2019-05-10 (×4): qty 2

## 2019-05-10 MED ORDER — DILTIAZEM HCL-DEXTROSE 100-5 MG/100ML-% IV SOLN (PREMIX)
5.0000 mg/h | INTRAVENOUS | Status: DC
  Administered 2019-05-10: 17:00:00 5 mg/h via INTRAVENOUS
  Administered 2019-05-11 – 2019-05-13 (×7): 12 mg/h via INTRAVENOUS
  Administered 2019-05-13 (×2): 10 mg/h via INTRAVENOUS
  Filled 2019-05-10 (×12): qty 100

## 2019-05-10 MED ORDER — ONDANSETRON HCL 4 MG/2ML IJ SOLN: 4.0000 mg | Freq: Four times a day (QID) | INTRAMUSCULAR | Status: DC | PRN

## 2019-05-10 MED ORDER — LEVOTHYROXINE SODIUM 100 MCG PO TABS
200.0000 ug | ORAL_TABLET | Freq: Every day | ORAL | Status: DC
  Administered 2019-05-11: 200 ug via ORAL
  Filled 2019-05-10: qty 2

## 2019-05-10 MED ORDER — METOPROLOL TARTRATE 12.5 MG HALF TABLET
12.5000 mg | ORAL_TABLET | Freq: Two times a day (BID) | ORAL | Status: AC
  Administered 2019-05-10 – 2019-05-15 (×11): 12.5 mg via ORAL
  Filled 2019-05-10 (×11): qty 1

## 2019-05-10 MED ORDER — FUROSEMIDE 10 MG/ML IJ SOLN
60.0000 mg | Freq: Once | INTRAMUSCULAR | Status: AC
  Administered 2019-05-10: 60 mg via INTRAVENOUS
  Filled 2019-05-10: qty 6

## 2019-05-10 MED ORDER — OXYCODONE HCL ER 10 MG PO T12A
10.0000 mg | EXTENDED_RELEASE_TABLET | Freq: Two times a day (BID) | ORAL | Status: DC
  Administered 2019-05-10 – 2019-05-17 (×15): 10 mg via ORAL
  Filled 2019-05-10 (×15): qty 1

## 2019-05-10 MED ORDER — PREGABALIN 75 MG PO CAPS
75.0000 mg | ORAL_CAPSULE | Freq: Three times a day (TID) | ORAL | Status: DC
  Administered 2019-05-10 – 2019-05-17 (×20): 75 mg via ORAL
  Filled 2019-05-10 (×20): qty 1
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
## 2019-05-10 NOTE — ED Triage Notes (Signed)
Pt newly diagnosed with CHF, has gained 15 lbs in less than two weeks. Has not taken diuretic in 2 days "because she got pissed with it." Pt wears 2-3L O2 at home. Endorses shob onset this morning.
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
## 2019-05-10 NOTE — ED Provider Notes (Signed)
  Physical Exam  BP (!) 159/131 (BP Location: Right Arm)   Pulse (!) 147   Temp 97.8 F (36.6 C) (Oral)   Resp (!) 28   Ht 5\' 6"  (1.676 m)   Wt 112.5 kg   SpO2 93%   BMI 40.03 kg/m   Physical Exam  ED Course/Procedures     Procedures  MDM  Assuming care of patient from Dr. Tyrone Nine.   Patient in the ED for worsening DIB. She is on 2L O2. Stopped meds 2-3 days ago. Workup thus far shows elevated hs-tn and BNP with CXR showing vascular congestion.  Concerning findings are as following: tachypnea and mild resp distress. She is also in af with RVR. She is likely persistent AF. Important pending results are none.  According to Dr. Tyrone Nine, plan is to admit the patient.   Patient had no complains, no concerns from the nursing side. Will continue to monitor.   EKG Interpretation  Date/Time:  Thursday May 10 2019 13:55:34 EDT Ventricular Rate:  130 PR Interval:    QRS Duration: 113 QT Interval:  314 QTC Calculation: 462 R Axis:   -38 Text Interpretation:  Atrial fibrillation Borderline IVCD with LAD Borderline low voltage, extremity leads Consider anterior infarct Borderline repolarization abnormality Baseline wander in lead(s) II III aVF V2 V6 No significant change since last tracing Confirmed by Deno Etienne [phone]) on 05/10/2019 2:14:52 PM       CRITICAL CARE Performed by: Karolyne Timmons   Total critical care time: 36 minutes  Critical care time was exclusive of separately billable procedures and treating other patients.  Critical care was necessary to treat or prevent imminent or life-threatening deterioration.  Critical care was time spent personally by me on the following activities: development of treatment plan with patient and/or surrogate as well as nursing, discussions with consultants, evaluation of patient's response to treatment, examination of patient, obtaining history from patient or surrogate, ordering and performing treatments and interventions, ordering  and review of laboratory studies, ordering and review of radiographic studies, pulse oximetry and re-evaluation of patient's condition.    Reassessment: Patient on diltiazem drip. Cardiology will admit.    Varney Biles, MD 05/11/19 [phone]

## 2019-05-11 ENCOUNTER — Telehealth: Payer: Self-pay

## 2019-05-11 ENCOUNTER — Other Ambulatory Visit: Payer: Self-pay

## 2019-05-11 DIAGNOSIS — I4811 Longstanding persistent atrial fibrillation: Secondary | ICD-10-CM

## 2019-05-11 LAB — BASIC METABOLIC PANEL
Anion gap: 13 (ref 5–15)
BUN: 25 mg/dL — ABNORMAL HIGH (ref 8–23)
CO2: 32 mmol/L (ref 22–32)
Calcium: 9 mg/dL (ref 8.9–10.3)
Chloride: 99 mmol/L (ref 98–111)
Creatinine, Ser: 1.37 mg/dL — ABNORMAL HIGH (ref 0.44–1.00)
GFR calc Af Amer: 44 mL/min — ABNORMAL LOW (ref >60)
GFR calc non Af Amer: 38 mL/min — ABNORMAL LOW (ref >60)
Glucose, Bld: 89 mg/dL (ref 70–99)
Potassium: 3.2 mmol/L — ABNORMAL LOW (ref 3.5–5.1)
Sodium: 144 mmol/L (ref 135–145)

## 2019-05-11 MED ORDER — PREDNISONE 5 MG PO TABS: 12.5000 mg | ORAL_TABLET | Freq: Every day | ORAL | Status: DC

## 2019-05-11 MED ORDER — LUBIPROSTONE 24 MCG PO CAPS
24.0000 ug | ORAL_CAPSULE | Freq: Two times a day (BID) | ORAL | Status: DC
  Administered 2019-05-11 – 2019-05-16 (×11): 24 ug via ORAL
  Filled 2019-05-11 (×11): qty 1

## 2019-05-11 MED ORDER — LEVOTHYROXINE SODIUM 75 MCG PO TABS
225.0000 ug | ORAL_TABLET | Freq: Every day | ORAL | Status: DC
  Administered 2019-05-12 – 2019-05-17 (×5): 225 ug via ORAL
  Filled 2019-05-11 (×5): qty 3

## 2019-05-11 MED ORDER — PREDNISONE 5 MG PO TABS
15.0000 mg | ORAL_TABLET | Freq: Every day | ORAL | Status: DC
  Administered 2019-05-12 – 2019-05-16 (×5): 15 mg via ORAL
  Filled 2019-05-11 (×5): qty 1

## 2019-05-11 MED ORDER — POTASSIUM CHLORIDE CRYS ER 20 MEQ PO TBCR
20.0000 meq | EXTENDED_RELEASE_TABLET | Freq: Two times a day (BID) | ORAL | Status: DC
  Administered 2019-05-11 (×2): 20 meq via ORAL
  Filled 2019-05-11 (×2): qty 1

## 2019-05-11 MED ORDER — SENNOSIDES-DOCUSATE SODIUM 8.6-50 MG PO TABS
2.0000 | ORAL_TABLET | Freq: Every day | ORAL | Status: DC
  Administered 2019-05-11 – 2019-05-16 (×6): 2 via ORAL
  Filled 2019-05-11 (×6): qty 2

## 2019-05-11 NOTE — Discharge Instructions (Addendum)
Information on my medicine - XARELTO (Rivaroxaban)  Why was Xarelto prescribed for you? Xarelto was prescribed for you to reduce the risk of a blood clot forming that can cause a stroke if you have a medical condition called atrial fibrillation (a type of irregular heartbeat).  What do you need to know about xarelto ? Take your Xarelto ONCE DAILY at the same time every day with your evening meal. If you have difficulty swallowing the tablet whole, you may crush it and mix in applesauce just prior to taking your dose.  Take Xarelto exactly as prescribed by your doctor and DO NOT stop taking Xarelto without talking to the doctor who prescribed the medication.  Stopping without other stroke prevention medication to take the place of Xarelto may increase your risk of developing a clot that causes a stroke.  Refill your prescription before you run out.  After discharge, you should have regular check-up appointments with your healthcare provider that is prescribing your Xarelto.  In the future your dose may need to be changed if your kidney function or weight changes by a significant amount.  What do you do if you miss a dose? If you are taking Xarelto ONCE DAILY and you miss a dose, take it as soon as you remember on the same day then continue your regularly scheduled once daily regimen the next day. Do not take two doses of Xarelto at the same time or on the same day.   Important Safety Information A possible side effect of Xarelto is bleeding. You should call your healthcare provider right away if you experience any of the following: ? Bleeding from an injury or your nose that does not stop. ? Unusual colored urine (red or dark brown) or unusual colored stools (red or black). ? Unusual bruising for unknown reasons. ? A serious fall or if you hit your head (even if there is no bleeding).  Some medicines may interact with Xarelto and might increase your risk of bleeding while on  Xarelto. To help avoid this, consult your healthcare provider or pharmacist prior to using any new prescription or non-prescription medications, including herbals, vitamins, non-steroidal anti-inflammatory drugs (NSAIDs) and supplements.  This website has more information on Xarelto: [email or web address]-benson.[email or web address]  Please take your bumex 2mg  daily with metolazone 2.5mg  as needed for weight gain or greater than 3lbs. Do not exceed more than 1 dose a week.

## 2019-05-11 NOTE — Progress Notes (Signed)
Pt arrived to the unit with diltiazem infusing at 12 mg/hr. Pt alert and oriented X4. Skin warm and dry, generalized swelling but more to bilateral lower extremities. SOB on exertion. Both HR and BP were elevated. Will continue monitoring

## 2019-05-11 NOTE — Progress Notes (Addendum)
Progress Note  Patient Name: Nicole Norton Date of Encounter: 05/11/2019  Primary Cardiologist: Quay Burow, MD  EP: Dr. Curt Bears  Subjective   She gained about 25lb since started Prednisone few months ago which held during admission. No chest pain. Breathing improved little.   Inpatient Medications    Scheduled Meds: . flecainide  100 mg Oral BID  . furosemide  60 mg Intravenous Q12H  . levothyroxine  200 mcg Oral QAC breakfast  . metoprolol tartrate  12.5 mg Oral BID  . oxyCODONE  10 mg Oral Q12H  . pantoprazole  40 mg Oral Daily  . pregabalin  75 mg Oral TID  . rivaroxaban  20 mg Oral Q supper  . sodium chloride flush  3 mL Intravenous Q12H   Continuous Infusions: . sodium chloride    . diltiazem (CARDIZEM) infusion 12 mg/hr (05/11/19 0036)   PRN Meds: sodium chloride, acetaminophen, ondansetron (ZOFRAN) IV, sodium chloride flush   Vital Signs    Vitals:   05/11/19 0504 05/11/19 0510 05/11/19 0512 05/11/19 0722  BP:  (!) 87/66 (!) 139/96 132/88  Pulse:  (!) 111 (!) 108 (!) 115  Resp:    16  Temp:  98.1 F (36.7 C)  98.1 F (36.7 C)  TempSrc:  Oral  Oral  SpO2:  95%  96%  Weight: 110.7 kg     Height:        Intake/Output Summary (Last 24 hours) at 05/11/2019 0929 Last data filed at 05/11/2019 0093 Gross per 24 hour  Intake 728.1 ml  Output 2450 ml  Net -1721.9 ml   Last 3 Weights 05/11/2019 05/10/2019 05/10/2019  Weight (lbs) 244 lb 244 lb 9.6 oz 244 lb 9.6 oz  Weight (kg) 110.678 kg 110.95 kg 110.95 kg      Telemetry    Atrial fibrillation at rate of 110s - Personally Reviewed  ECG  No new tracing   Physical Exam   GEN: Obese female in no acute distress.   Neck: JVD difficult to assess due to grith Cardiac: Irregularly irregular tachycardic, no murmurs, rubs, or gallops.  Respiratory:faint rales bilaterlly GI: Soft, nontender, non-distended  MS: 3+ bilateral edema; No deformity. Neuro:  Nonfocal  Psych: Normal affect   Labs   High Sensitivity Troponin:   Recent Labs  Lab 05/10/19 1419 05/10/19 1704  TROPONINIHS 141* 365*      Cardiac EnzymesNo results for input(s): TROPONINI in the last 168 hours. No results for input(s): TROPIPOC in the last 168 hours.   Chemistry Recent Labs  Lab 05/10/19 1403 05/10/19 1420 05/11/19 0655  NA 147* 146* 144  K 3.5 3.5 3.2*  CL 107  --  99  CO2 26  --  32  GLUCOSE 121*  --  89  BUN 29*  --  25*  CREATININE 1.22*  --  1.37*  CALCIUM 9.7  --  9.0  GFRNONAA 43*  --  38*  GFRAA 50*  --  44*  ANIONGAP 14  --  13     Hematology Recent Labs  Lab 05/10/19 1403 05/10/19 1420  WBC 14.8*  --   RBC 4.19  --   HGB 13.4 13.9  HCT 43.6 41.0  MCV 104.1*  --   MCH 32.0  --   MCHC 30.7  --   RDW 13.1  --   PLT 262  --     BNP Recent Labs  Lab 05/10/19 1403  BNP 773.4*     DDimer No results for  input(s): DDIMER in the last 168 hours.   Radiology    Dg Chest Port 1 View  Result Date: 05/10/2019 CLINICAL DATA:  Shortness of breath, chest pain. EXAM: PORTABLE CHEST 1 VIEW COMPARISON:  Radiographs of Mar 27, 2019. FINDINGS: Stable cardiomegaly with central pulmonary vascular congestion. No pneumothorax or pleural effusion is noted. Increased interstitial densities are noted throughout both lungs which may represent pulmonary edema. Bony thorax is unremarkable. IMPRESSION: Stable cardiomegaly with central pulmonary vascular congestion and possible bilateral pulmonary edema. Electronically Signed   By: Marijo Conception M.D.   On: 05/10/2019 15:18    Cardiac Studies   None this admission   Patient Profile   Nicole Norton is a 78 y.o. female with a hx of atrial fibrillation, chronic anticoagulation, diastolic dysfunction HTN and HLD w/ statin intolerance who is admitted with acute CHF and atrial fibrillation w/ RVR.  Last echo 10/20/2018 showed normal LVEF at 60-65%, G2DD, mild MR and mild-moderate MR and mild pulmonary HTN, PA pressure was 39.   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 Since then, she has been on home 02 and treated w/ prednisone. She has also had difficulties with weight gain, edema and dyspnea since April. Failed outpatient diuresis.   Assessment & Plan    1. Acute on chronic diastolic CHF - This is in the setting of persistent atrial fibrillation since developing CAP in April.  Also on prednisone.  BNP is elevated at 773. - Net I & O negative 1.7L.  Weight down ~ 1lb. Scr worsen minimally 1.37 from 1.22 (baseline). Significant volume overload by exam.  - Continue IV diuresis. Watch renal function and electrolytes. Continue BB.   2. Persistent atrial fibrillation with RVR - She had recurrence of her afib recently after developing CAP in April. She had f/u with Dr. Curt Bears (televisit) on 03/29/19 and he noted that she would likely need repeat cardioversion to restore NSR. - Rate improving on IV cardizem. Continue BB and flecainide. Continue Xarelto for anticoagulation (no missed doses).  - Tentatively plan Cardioversion on Monday.  3. Hypokalemia - Will give supplement  4. Elevated hsT - 141>>365. She denies CP. Suspect this is demand ischemia in the setting of rapid afib and acute on chronic diastolic CHF. No plans for ischemic w/u.    For questions or updates, please contact Nicole Norton Please consult [email or web address] for contact info under        Signed, Nicole Kail, PA  05/11/2019, 9:29 AM    Agree with note by Nicole Lis PA-C  Ms. Nicole Norton is well-known to me from the outpatient setting.  She has chronic A. fib on Xarelto oral anticoagulation as well as diastolic heart failure.  She is gained 25 pounds in last several months probably as a result of prednisone prescribed because of ESBL significant pneumonia.  She came into the hospital volume overloaded with acute on chronic diastolic heart failure and A. fib with RVR.  She has been diuresed now 1.7 L and feels somewhat clinically  improved although she still has 2-3+ pitting edema peripherally.  Her heart rate has nicely come down IV diltiazem.  Her exam is benign.  The plan is to continue IV diuresis over the weekend with anticipation of DC cardioversion on Monday.  Nicole Norton, M.D., [place], [facility], [place] [address] [place],   [address]  [phone] 05/11/2019 10:46 AM

## 2019-05-11 NOTE — Telephone Encounter (Signed)
Attempted to follow up with pt.   Pt currently admitted to [facility] as of yesterday. Possible DCCV Monday [place] made aware.

## 2019-05-11 NOTE — Telephone Encounter (Signed)
.  Called and spoke with Tom.  She is feeling a little bit better today but unfortunately just has not diuresed much fluid. Me know if there is anything that we can be helpful with.  And I have that she gets home quickly

## 2019-05-11 NOTE — Progress Notes (Signed)
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  She has been on steroids since at least January 2020.   Spoke with Leanor Kail, PA and plans are to taper the prednisone. Pharmacy was asked to assist  With prednisone doses of 20-40mg /day may need to decrease by 10-20% every week although regimens are typically individualized.  Plan -start prednisone 15mg  po daily then taper by 2.5mg  each week down to 5mg  daily for at least one week then off -Consider having patient follow up at [facility] with her pulmonologist (Dr. Laurance Flatten)  Hildred Laser, PharmD Clinical Pharmacist **Pharmacist phone directory can now be found on [email or web address] ([email or web address]).  Listed under [place].

## 2019-05-11 NOTE — Plan of Care (Signed)
Pt's HR and BP are improving. She has been educated on salt limitation, medication adherence, signs and symptoms of CHF exacerbation. Pt verbalized understanding and agrees with the plan of care.

## 2019-05-11 NOTE — Progress Notes (Signed)
Orthopedic Tech Progress Note Patient Details:  Tijuana Scheidegger February 20, 1941 [id number]  Ortho Devices Type of Ortho Device: Haematologist Ortho Device/Splint Location: bilateral Ortho Device/Splint Interventions: Adjustment, Application, Ordered   Post Interventions Patient Tolerated: Well Instructions Provided: Care of device, Adjustment of device   Janit Pagan 05/11/2019, 11:48 AM

## 2019-05-11 NOTE — Telephone Encounter (Signed)
Modesty husband called and wanted to let Dr [name] know that Nicole Norton is in the hospital.

## 2019-05-12 LAB — BASIC METABOLIC PANEL
Anion gap: 12 (ref 5–15)
BUN: 25 mg/dL — ABNORMAL HIGH (ref 8–23)
CO2: 33 mmol/L — ABNORMAL HIGH (ref 22–32)
Calcium: 9.2 mg/dL (ref 8.9–10.3)
Chloride: 98 mmol/L (ref 98–111)
Creatinine, Ser: 1.37 mg/dL — ABNORMAL HIGH (ref 0.44–1.00)
GFR calc Af Amer: 44 mL/min — ABNORMAL LOW (ref >60)
GFR calc non Af Amer: 38 mL/min — ABNORMAL LOW (ref >60)
Glucose, Bld: 90 mg/dL (ref 70–99)
Potassium: 3.3 mmol/L — ABNORMAL LOW (ref 3.5–5.1)
Sodium: 143 mmol/L (ref 135–145)

## 2019-05-12 MED ORDER — POTASSIUM CHLORIDE CRYS ER 20 MEQ PO TBCR
40.0000 meq | EXTENDED_RELEASE_TABLET | Freq: Two times a day (BID) | ORAL | Status: AC
  Administered 2019-05-12 (×2): 40 meq via ORAL
  Filled 2019-05-12 (×2): qty 2

## 2019-05-12 NOTE — Progress Notes (Signed)
Progress Note  Patient Name: Nicole Norton Date of Encounter: 05/12/2019  Primary Cardiologist: Quay Burow, MD   Subjective   SOB is improving  Inpatient Medications    Scheduled Meds: . flecainide  100 mg Oral BID  . furosemide  60 mg Intravenous Q12H  . levothyroxine  225 mcg Oral QAC breakfast  . lubiprostone  24 mcg Oral BID WC  . metoprolol tartrate  12.5 mg Oral BID  . oxyCODONE  10 mg Oral Q12H  . pantoprazole  40 mg Oral Daily  . potassium chloride  20 mEq Oral BID  . predniSONE  15 mg Oral Q breakfast   Followed by  . [START ON 05/19/2019] predniSONE  12.5 mg Oral Q breakfast  . pregabalin  75 mg Oral TID  . rivaroxaban  20 mg Oral Q supper  . senna-docusate  2 tablet Oral QHS  . sodium chloride flush  3 mL Intravenous Q12H   Continuous Infusions: . sodium chloride    . diltiazem (CARDIZEM) infusion 12 mg/hr (05/12/19 0403)   PRN Meds: sodium chloride, acetaminophen, ondansetron (ZOFRAN) IV, sodium chloride flush   Vital Signs    Vitals:   05/11/19 1416 05/11/19 1943 05/12/19 0554 05/12/19 0837  BP: 102/86 126/72 113/76 105/89  Pulse: 96 99 96 (!) 110  Resp: 20 (!) 22 18 20   Temp: 98.1 F (36.7 C) 99 F (37.2 C) 98.2 F (36.8 C) (!) 97.4 F (36.3 C)  TempSrc: Oral Oral  Oral  SpO2: 95% 98% 100% 96%  Weight:   113 kg   Height:        Intake/Output Summary (Last 24 hours) at 05/12/2019 0900 Last data filed at 05/12/2019 0406 Gross per 24 hour  Intake 1133.06 ml  Output 2200 ml  Net -1066.94 ml   Last 3 Weights 05/12/2019 05/11/2019 05/10/2019  Weight (lbs) 249 lb 3.2 oz 244 lb 244 lb 9.6 oz  Weight (kg) 113.036 kg 110.678 kg 110.95 kg      Telemetry    Rate controlled afib - Personally Reviewed  ECG    N/a - Personally Reviewed  Physical Exam   GEN: No acute distress.   Neck: No JVD Cardiac: irreg, no murmurs, rubs, or gallops.  Respiratory: Clear to auscultation bilaterally. GI: Soft, nontender, non-distended  MS:  bilateral LEs are wrapped; No deformity. Neuro:  Nonfocal  Psych: Normal affect   Labs    High Sensitivity Troponin:   Recent Labs  Lab 05/10/19 1419 05/10/19 1704  TROPONINIHS 141* 365*      Cardiac EnzymesNo results for input(s): TROPONINI in the last 168 hours. No results for input(s): TROPIPOC in the last 168 hours.   Chemistry Recent Labs  Lab 05/10/19 1403 05/10/19 1420 05/11/19 0655 05/12/19 0232  NA 147* 146* 144 143  K 3.5 3.5 3.2* 3.3*  CL 107  --  99 98  CO2 26  --  32 33*  GLUCOSE 121*  --  89 90  BUN 29*  --  25* 25*  CREATININE 1.22*  --  1.37* 1.37*  CALCIUM 9.7  --  9.0 9.2  GFRNONAA 43*  --  38* 38*  GFRAA 50*  --  44* 44*  ANIONGAP 14  --  13 12     Hematology Recent Labs  Lab 05/10/19 1403 05/10/19 1420  WBC 14.8*  --   RBC 4.19  --   HGB 13.4 13.9  HCT 43.6 41.0  MCV 104.1*  --   MCH 32.0  --  MCHC 30.7  --   RDW 13.1  --   PLT 262  --     BNP Recent Labs  Lab 05/10/19 1403  BNP 773.4*     DDimer No results for input(s): DDIMER in the last 168 hours.   Radiology    Dg Chest Port 1 View  Result Date: 05/10/2019 CLINICAL DATA:  Shortness of breath, chest pain. EXAM: PORTABLE CHEST 1 VIEW COMPARISON:  Radiographs of Mar 27, 2019. FINDINGS: Stable cardiomegaly with central pulmonary vascular congestion. No pneumothorax or pleural effusion is noted. Increased interstitial densities are noted throughout both lungs which may represent pulmonary edema. Bony thorax is unremarkable. IMPRESSION: Stable cardiomegaly with central pulmonary vascular congestion and possible bilateral pulmonary edema. Electronically Signed   By: Marijo Conception M.D.   On: 05/10/2019 15:18    Cardiac Studies     Patient Profile     Nicole Norton a 78 y.o.femalewith a hx of atrial fibrillation, chronic anticoagulation, diastolic dysfunction HTN and HLD w/ statin intolerancewho is admitted with acute CHF and atrial fibrillation w/ RVR.   Last echo 10/20/2018 showed normal LVEF at 60-65%, G2DD, mild MR and mild-moderate MR and mild pulmonary HTN, PA pressure was 39.  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 Since then, she has been on home 02 and treated w/ prednisone. She has also had difficulties with weight gain, edema and dyspnea since April. Failed outpatient diuresis.   Assessment & Plan    1. Acute on chronic diasotlic HF - 32/5498 echo: LVEF  60-65%, grade II diatsolic dysfunction - negative 1.3 L yesterday, neg 2.5 L since admission. She is on lasix 60mg  IV bid, mild variations in Cr but within prior levels  - remains fluid overloaded, continue IV diuresis today   2. Peristent afib - - She had recurrence of her afib recently after developing CAP in April. She had f/u with Dr. Curt Bears (televisit) on 03/29/19 and he noted that she would likely need repeat cardioversion to restore NSR. - has been on dilt gtt, as well as lopressor 12.5mg  bid, flecanide 100mg  bid. Remains on xarelto. Tenative plans for DCCV Monday. From EP note 03/29/19 plans were for likely DCCV.   - rates currently controlled  3. 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 Has been on prednisone taper since, appreciate pharmacy assisance.   For questions or updates, please contact [place] Please consult [email or web address] for contact info under        Signed, Carlyle Dolly, MD  05/12/2019, 9:00 AM

## 2019-05-13 LAB — BASIC METABOLIC PANEL
Anion gap: 12 (ref 5–15)
BUN: 25 mg/dL — ABNORMAL HIGH (ref 8–23)
CO2: 33 mmol/L — ABNORMAL HIGH (ref 22–32)
Calcium: 8.6 mg/dL — ABNORMAL LOW (ref 8.9–10.3)
Chloride: 98 mmol/L (ref 98–111)
Creatinine, Ser: 1.42 mg/dL — ABNORMAL HIGH (ref 0.44–1.00)
GFR calc Af Amer: 42 mL/min — ABNORMAL LOW (ref >60)
GFR calc non Af Amer: 36 mL/min — ABNORMAL LOW (ref >60)
Glucose, Bld: 110 mg/dL — ABNORMAL HIGH (ref 70–99)
Potassium: 3.7 mmol/L (ref 3.5–5.1)
Sodium: 143 mmol/L (ref 135–145)

## 2019-05-13 LAB — MAGNESIUM: Magnesium: 1.3 mg/dL — ABNORMAL LOW (ref 1.7–2.4)

## 2019-05-13 MED ORDER — MAGNESIUM SULFATE 2 GM/50ML IV SOLN
2.0000 g | Freq: Once | INTRAVENOUS | Status: AC
  Administered 2019-05-13: 2 g via INTRAVENOUS
  Filled 2019-05-13: qty 50

## 2019-05-13 MED ORDER — POTASSIUM CHLORIDE CRYS ER 20 MEQ PO TBCR
40.0000 meq | EXTENDED_RELEASE_TABLET | Freq: Once | ORAL | Status: AC
  Administered 2019-05-13: 40 meq via ORAL
  Filled 2019-05-13: qty 2

## 2019-05-13 NOTE — Progress Notes (Signed)
Patient's HR dipped into the 40'a while sleeping at ~1155 am.  Paged cardiology PA, Cherlynn Polo, PA-C per cardiology PA, changed rate of cardiazem gtt to 10 mg/hr

## 2019-05-13 NOTE — Progress Notes (Signed)
Patient concerned with procedure tomorrow. Stated that she was doing fine this morining but with SOB she is not sure that she will be able to lay flat tomorrow. Explaind to the patient that she will be NPO after midnight and if she feels that she is not ready she can share concern with MD tomorrow morning. Patient agreeable. Also, informed patient if SOB progresses's or get worse to inform nursing staff. Will continue to monitor.  Sheliah Fiorillo, RN

## 2019-05-13 NOTE — Progress Notes (Signed)
Progress Note  Patient Name: Nicole Norton Date of Encounter: 05/13/2019  Primary Cardiologist: Quay Burow, MD   Subjective   SOB continues to improve  Inpatient Medications    Scheduled Meds: . flecainide  100 mg Oral BID  . furosemide  60 mg Intravenous Q12H  . levothyroxine  225 mcg Oral QAC breakfast  . lubiprostone  24 mcg Oral BID WC  . metoprolol tartrate  12.5 mg Oral BID  . oxyCODONE  10 mg Oral Q12H  . pantoprazole  40 mg Oral Daily  . predniSONE  15 mg Oral Q breakfast   Followed by  . [START ON 05/19/2019] predniSONE  12.5 mg Oral Q breakfast  . pregabalin  75 mg Oral TID  . rivaroxaban  20 mg Oral Q supper  . senna-docusate  2 tablet Oral QHS  . sodium chloride flush  3 mL Intravenous Q12H   Continuous Infusions: . sodium chloride    . diltiazem (CARDIZEM) infusion 12 mg/hr (05/12/19 2143)   PRN Meds: sodium chloride, acetaminophen, ondansetron (ZOFRAN) IV, sodium chloride flush   Vital Signs    Vitals:   05/12/19 1411 05/12/19 1926 05/13/19 0011 05/13/19 0638  BP: 104/71 114/83 119/86 107/84  Pulse: 78 97 83 78  Resp: 18 18 18 18   Temp: 98.3 F (36.8 C) 97.8 F (36.6 C) 98 F (36.7 C) (!) 97.2 F (36.2 C)  TempSrc: Oral Oral Oral   SpO2: 95% 100% 97% 99%  Weight:    109.9 kg  Height:        Intake/Output Summary (Last 24 hours) at 05/13/2019 0823 Last data filed at 05/13/2019 0030 Gross per 24 hour  Intake 447.6 ml  Output 2050 ml  Net -1602.4 ml   Last 3 Weights 05/13/2019 05/12/2019 05/11/2019  Weight (lbs) 242 lb 3.2 oz 249 lb 3.2 oz 244 lb  Weight (kg) 109.861 kg 113.036 kg 110.678 kg      Telemetry    Rate controlled afib- Personally Reviewed  ECG    n/a - Personally Reviewed  Physical Exam   GEN: No acute distress.   Neck: No JVD Cardiac:irreg, no m/r/g Respiratory: faint bilateral crackles GI: Soft, nontender, non-distended  MS: No edema; No deformity. Neuro:  Nonfocal  Psych: Normal affect   Labs     High Sensitivity Troponin:   Recent Labs  Lab 05/10/19 1419 05/10/19 1704  TROPONINIHS 141* 365*      Cardiac EnzymesNo results for input(s): TROPONINI in the last 168 hours. No results for input(s): TROPIPOC in the last 168 hours.   Chemistry Recent Labs  Lab 05/11/19 0655 05/12/19 0232 05/13/19 0219  NA 144 143 143  K 3.2* 3.3* 3.7  CL 99 98 98  CO2 32 33* 33*  GLUCOSE 89 90 110*  BUN 25* 25* 25*  CREATININE 1.37* 1.37* 1.42*  CALCIUM 9.0 9.2 8.6*  GFRNONAA 38* 38* 36*  GFRAA 44* 44* 42*  ANIONGAP 13 12 12      Hematology Recent Labs  Lab 05/10/19 1403 05/10/19 1420  WBC 14.8*  --   RBC 4.19  --   HGB 13.4 13.9  HCT 43.6 41.0  MCV 104.1*  --   MCH 32.0  --   MCHC 30.7  --   RDW 13.1  --   PLT 262  --     BNP Recent Labs  Lab 05/10/19 1403  BNP 773.4*     DDimer No results for input(s): DDIMER in the last 168 hours.  Radiology    No results found.  Cardiac Studies     Patient Profile     Nicole Mattioli Johnsonis a 78 y.o.femalewith a hx of atrial fibrillation, chronic anticoagulation, diastolic dysfunction HTN and HLD w/ statin intolerancewho is admitted withacute CHF and atrial fibrillation w/ RVR.  Last echo 10/20/2018 showed normal LVEF at 60-65%, G2DD, mild MR and mild-moderate MR and mild pulmonary HTN, PA pressure was 39.  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 Since then, she has been on home 02 and treated w/ prednisone. She has also had difficulties with weight gain, edema and dyspnea since April. Failed outpatient diuresis.  Assessment & Plan    1. Acute on chronic diasotlic HF - 21/1941 echo: LVEF  60-65%, grade II diatsolic dysfunction - negative 1.6 L yesterday, neg 4.1 L since admission. She is on lasix 60mg  IV bid, mild uptrend in Cr but within prior levels  - remains fluid overloaded, continue IV diuresis today   2. Peristent afib - - She had recurrence of her afib recently after  developing CAP in April. She had f/u with Dr. Curt Bears (televisit) on 03/29/19 and he noted that she would likely need repeat cardioversion to restore NSR. - has been on dilt gtt, as well as lopressor 12.5mg  bid, flecanide 100mg  bid. Remains on xarelto. Tenative plans for DCCV Monday. From EP note 03/29/19 plans were for likely DCCV.   - plan for DCCV tomorrow, she has not missed any doses of her xarelto. Back to her baseline O2 requirement  3. 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 Has been on prednisone taper since, appreciate pharmacy assisance.   5. HypoMg - replace with IV 2 g magnesium sulfate today, recheck tomrrow    For questions or updates, please contact [place] Please consult [email or web address] for contact info under        Signed, Carlyle Dolly, MD  05/13/2019, 8:23 AM

## 2019-05-14 ENCOUNTER — Inpatient Hospital Stay (HOSPITAL_COMMUNITY): Payer: Medicare Other | Admitting: Registered Nurse

## 2019-05-14 ENCOUNTER — Inpatient Hospital Stay (HOSPITAL_COMMUNITY): Payer: Medicare Other

## 2019-05-14 ENCOUNTER — Encounter (HOSPITAL_COMMUNITY)
Admission: EM | Disposition: A | Discharge status: Home / Self Care | Payer: Self-pay | Source: Home / Self Care | Attending: Cardiovascular Disease

## 2019-05-14 ENCOUNTER — Encounter (HOSPITAL_COMMUNITY): Payer: Self-pay | Admitting: Certified Registered Nurse Anesthetist

## 2019-05-14 DIAGNOSIS — I361 Nonrheumatic tricuspid (valve) insufficiency: Secondary | ICD-10-CM

## 2019-05-14 DIAGNOSIS — I340 Nonrheumatic mitral (valve) insufficiency: Secondary | ICD-10-CM

## 2019-05-14 DIAGNOSIS — I480 Paroxysmal atrial fibrillation: Secondary | ICD-10-CM

## 2019-05-14 HISTORY — PX: CARDIOVERSION: SHX1299

## 2019-05-14 LAB — BASIC METABOLIC PANEL
Anion gap: 11 (ref 5–15)
BUN: 28 mg/dL — ABNORMAL HIGH (ref 8–23)
CO2: 32 mmol/L (ref 22–32)
Calcium: 8.5 mg/dL — ABNORMAL LOW (ref 8.9–10.3)
Chloride: 100 mmol/L (ref 98–111)
Creatinine, Ser: 1.46 mg/dL — ABNORMAL HIGH (ref 0.44–1.00)
GFR calc Af Amer: 40 mL/min — ABNORMAL LOW (ref >60)
GFR calc non Af Amer: 35 mL/min — ABNORMAL LOW (ref >60)
Glucose, Bld: 94 mg/dL (ref 70–99)
Potassium: 3.4 mmol/L — ABNORMAL LOW (ref 3.5–5.1)
Sodium: 143 mmol/L (ref 135–145)

## 2019-05-14 LAB — ECHOCARDIOGRAM COMPLETE
Height: 66 in
Weight: 3892.8 oz

## 2019-05-14 LAB — MAGNESIUM: Magnesium: 1.5 mg/dL — ABNORMAL LOW (ref 1.7–2.4)

## 2019-05-14 SURGERY — CARDIOVERSION
Anesthesia: General

## 2019-05-14 MED ORDER — POTASSIUM CHLORIDE CRYS ER 20 MEQ PO TBCR
20.0000 meq | EXTENDED_RELEASE_TABLET | Freq: Two times a day (BID) | ORAL | Status: DC
  Administered 2019-05-14 – 2019-05-17 (×7): 20 meq via ORAL
  Filled 2019-05-14 (×7): qty 1

## 2019-05-14 MED ORDER — FUROSEMIDE 10 MG/ML IJ SOLN: 80.0000 mg | Freq: Two times a day (BID) | INTRAMUSCULAR | Status: DC

## 2019-05-14 MED ORDER — PROPOFOL 10 MG/ML IV BOLUS
INTRAVENOUS | Status: DC | PRN
  Administered 2019-05-14: 55 mg via INTRAVENOUS

## 2019-05-14 MED ORDER — LIDOCAINE 2% (20 MG/ML) 5 ML SYRINGE
INTRAMUSCULAR | Status: DC | PRN
  Administered 2019-05-14: 40 mg via INTRAVENOUS

## 2019-05-14 MED ORDER — FUROSEMIDE 10 MG/ML IJ SOLN
80.0000 mg | Freq: Two times a day (BID) | INTRAMUSCULAR | Status: DC
  Administered 2019-05-14 (×2): 80 mg via INTRAVENOUS
  Filled 2019-05-14 (×3): qty 8

## 2019-05-14 MED ORDER — METOLAZONE 2.5 MG PO TABS
2.5000 mg | ORAL_TABLET | Freq: Once | ORAL | Status: AC
  Administered 2019-05-14: 2.5 mg via ORAL
  Filled 2019-05-14: qty 1

## 2019-05-14 MED ORDER — MAGNESIUM SULFATE 2 GM/50ML IV SOLN
2.0000 g | Freq: Once | INTRAVENOUS | Status: AC
  Administered 2019-05-14: 2 g via INTRAVENOUS
  Filled 2019-05-14: qty 50

## 2019-05-14 NOTE — Plan of Care (Signed)
  Problem: Education: Goal: Ability to demonstrate management of disease process will improve Outcome: Progressing   Problem: Education: Goal: Ability to verbalize understanding of medication therapies will improve Outcome: Progressing   Problem: Education: Goal: Knowledge of General Education information will improve Description: Including pain rating scale, medication(s)/side effects and non-pharmacologic comfort measures Outcome: Progressing   Problem: Clinical Measurements: Goal: Respiratory complications will improve Outcome: Progressing   Problem: Safety: Goal: Ability to remain free from injury will improve Outcome: Progressing

## 2019-05-14 NOTE — Transfer of Care (Signed)
Immediate Anesthesia Transfer of Care Note  Patient: Nicole Norton  Procedure(s) Performed: CARDIOVERSION (N/A )  Patient Location: Endoscopy Unit  Anesthesia Type:General  Level of Consciousness: awake, alert  and oriented  Airway & Oxygen Therapy: Patient Spontanous Breathing  Post-op Assessment: Report given to RN and Post -op Vital signs reviewed and stable  Post vital signs: Reviewed and stable  Last Vitals:  Vitals Value Taken Time  BP    Temp    Pulse    Resp    SpO2      Last Pain:  Vitals:   05/14/19 1233  TempSrc: Oral  PainSc: 4       Patients Stated Pain Goal: 0 (06/34/94 9447)  Complications: No apparent anesthesia complications

## 2019-05-14 NOTE — CV Procedure (Signed)
Procedure:   DCCV  Indication:  Symptomatic atrial fibrillation  Procedure Note:  The patient signed informed consent.  She has had had therapeutic anticoagulation with rivaroxaban greater than 3 weeks.  Anesthesia was administered by Dr. Smith Robert.  Adequate airway was maintained throughout and vital followed per protocol.  She was cardioverted x 1 with 150J of biphasic synchronized energy.  She converted to NSR.  There were no apparent complications.  The patient had normal neuro status and respiratory status post procedure with vitals stable as recorded elsewhere.    Follow up:  Return to the floor. Dr. Burt Knack made aware. Dilitazem IV stopped with HR 60 bpm in NSR.    [name], MD PhD 05/14/2019 12:51 PM

## 2019-05-14 NOTE — Interval H&P Note (Signed)
History and Physical Interval Note:  05/14/2019 12:29 PM  Nicole Norton  has presented today for surgery, with the diagnosis of afib.  The various methods of treatment have been discussed with the patient and family. After consideration of risks, benefits and other options for treatment, the patient has consented to  Procedure(s): CARDIOVERSION (N/A) as a surgical intervention.  The patient's history has been reviewed, patient examined, no change in status, stable for surgery.  I have reviewed the patient's chart and labs.  Questions were answered to the patient's satisfaction.     Blessed Cotham Harrell Gave

## 2019-05-14 NOTE — Progress Notes (Signed)
  Echocardiogram 2D Echocardiogram has been performed.  Nicole Norton 05/14/2019, 4:40 PM

## 2019-05-14 NOTE — Progress Notes (Signed)
Progress Note  Patient Name: Nicole Norton Date of Encounter: 05/14/2019  Primary Cardiologist: Quay Burow, MD   Subjective   Still short of breath.  No chest pain or pressure.  Moderate orthopnea persists.  Feels like she still has "a lot of water."  Inpatient Medications    Scheduled Meds: . flecainide  100 mg Oral BID  . furosemide  60 mg Intravenous Q12H  . levothyroxine  225 mcg Oral QAC breakfast  . lubiprostone  24 mcg Oral BID WC  . metoprolol tartrate  12.5 mg Oral BID  . oxyCODONE  10 mg Oral Q12H  . pantoprazole  40 mg Oral Daily  . predniSONE  15 mg Oral Q breakfast   Followed by  . [START ON 05/19/2019] predniSONE  12.5 mg Oral Q breakfast  . pregabalin  75 mg Oral TID  . rivaroxaban  20 mg Oral Q supper  . senna-docusate  2 tablet Oral QHS  . sodium chloride flush  3 mL Intravenous Q12H   Continuous Infusions: . sodium chloride    . diltiazem (CARDIZEM) infusion 10 mg/hr (05/13/19 1724)   PRN Meds: sodium chloride, acetaminophen, ondansetron (ZOFRAN) IV, sodium chloride flush   Vital Signs    Vitals:   05/13/19 0834 05/13/19 1434 05/13/19 2046 05/14/19 0338  BP: 129/87 116/78 109/74   Pulse: 91 81 88   Resp: 18 18 16    Temp:   98.1 F (36.7 C)   TempSrc:   Oral   SpO2: 96% 93% 100%   Weight:    110.4 kg  Height:        Intake/Output Summary (Last 24 hours) at 05/14/2019 0946 Last data filed at 05/14/2019 0805 Gross per 24 hour  Intake 525.38 ml  Output 2101 ml  Net -1575.62 ml   Last 3 Weights 05/14/2019 05/13/2019 05/12/2019  Weight (lbs) 243 lb 4.8 oz 242 lb 3.2 oz 249 lb 3.2 oz  Weight (kg) 110.36 kg 109.861 kg 113.036 kg      Telemetry    Atrial fibrillation heart rate 75 bpm- Personally Reviewed   Physical Exam  Pleasant, obese woman, in no distress on O2 per nasal cannula GEN: No acute distress.   Neck:  Moderate JVD Cardiac:  Irregularly irregular, no murmurs, rubs, or gallops.  Respiratory: Clear to auscultation  bilaterally. GI: Soft, nontender, non-distended  MS:  Both legs are wrapped, but appears to have 2+ bilateral pretibial edema; No deformity. Neuro:  Nonfocal  Psych: Normal affect   Labs    High Sensitivity Troponin:   Recent Labs  Lab 05/10/19 1419 05/10/19 1704  TROPONINIHS 141* 365*      Cardiac EnzymesNo results for input(s): TROPONINI in the last 168 hours. No results for input(s): TROPIPOC in the last 168 hours.   Chemistry Recent Labs  Lab 05/12/19 0232 05/13/19 0219 05/14/19 0322  NA 143 143 143  K 3.3* 3.7 3.4*  CL 98 98 100  CO2 33* 33* 32  GLUCOSE 90 110* 94  BUN 25* 25* 28*  CREATININE 1.37* 1.42* 1.46*  CALCIUM 9.2 8.6* 8.5*  GFRNONAA 38* 36* 35*  GFRAA 44* 42* 40*  ANIONGAP 12 12 11      Hematology Recent Labs  Lab 05/10/19 1403 05/10/19 1420  WBC 14.8*  --   RBC 4.19  --   HGB 13.4 13.9  HCT 43.6 41.0  MCV 104.1*  --   MCH 32.0  --   MCHC 30.7  --   RDW 13.1  --  PLT 262  --     BNP Recent Labs  Lab 05/10/19 1403  BNP 773.4*     DDimer No results for input(s): DDIMER in the last 168 hours.   Radiology    No results found.  Cardiac Studies   Echo 10/20/2018: Study Conclusions  - Left ventricle: The cavity size was normal. Wall thickness was   normal. Systolic function was normal. The estimated ejection   fraction was in the range of 60% to 65%. Wall motion was normal;   there were no regional wall motion abnormalities. Features are   consistent with a pseudonormal left ventricular filling pattern,   with concomitant abnormal relaxation and increased filling   pressure (grade 2 diastolic dysfunction). Doppler parameters are   consistent with high ventricular filling pressure. - Aortic valve: Trileaflet; mildly thickened leaflets. Valve area   (VTI): 2.16 cm^2. Valve area (Vmean): 1.93 cm^2. - Mitral valve: Mildly calcified annulus. There was mild   regurgitation. - Right atrium: The atrium was mildly dilated. - Tricuspid  valve: There was mild-moderate regurgitation. - Pulmonary arteries: PA peak pressure: 39 mm Hg (S). - Systemic veins: IVC is dilated.  Patient Profile     78 y.o. female with persistent atrial fibrillation, admitted with acute diastolic heart failure and atrial fibrillation.  Assessment & Plan    1.  Symptomatic atrial fibrillation with RVR: The patient has been consistently anticoagulated with rivaroxaban without missing any doses in the last 30 days.  Plans noted for elective cardioversion today -scheduled for 1 PM.  She remains on IV diltiazem with good heart rate control at present.  Will need close follow-up with Dr. Curt Bears to determine whether further treatment such as atrial fib ablation is indicated.  2.  Acute on chronic diastolic heart failure: Diuresing well, 5.6 L negative since admission.  Weight has not come down much, only 5 pounds from admission.  Will increase her diuretic regimen today.  Recommend updating her echocardiogram since she is admitted with heart failure.  Last echo in December 2019 showed preserved LV systolic function.  She is at risk for tachycardia mediated cardiomyopathy.  3.  Elevated high-sensitivity troponin: Likely related to demand ischemia in the setting of atrial fibrillation with RVR and acute on chronic heart failure.  4.  Chronic kidney disease stage III: Creatinine essentially stable at 1.46 mg/dL this morning.  For questions or updates, please contact [place] Please consult [email or web address] for contact info under     Signed, [name], MD  05/14/2019, 9:46 AM

## 2019-05-14 NOTE — Progress Notes (Signed)
Patient eating.  Will attempt echo later

## 2019-05-14 NOTE — Anesthesia Preprocedure Evaluation (Signed)
Anesthesia Evaluation  Patient identified by MRN, date of birth, ID band Patient awake    Reviewed: Allergy & Precautions, NPO status , Patient's Chart, lab work & pertinent test results  Airway Mallampati: II  TM Distance: >3 FB     Dental   Pulmonary pneumonia, former smoker,    breath sounds clear to auscultation       Cardiovascular hypertension, +CHF   Rhythm:Irregular Rate:Normal     Neuro/Psych    GI/Hepatic GERD  ,  Endo/Other  Hypothyroidism   Renal/GU Renal disease     Musculoskeletal   Abdominal   Peds  Hematology  (+) anemia ,   Anesthesia Other Findings   Reproductive/Obstetrics                             Anesthesia Physical Anesthesia Plan  ASA: III  Anesthesia Plan: General   Post-op Pain Management:    Induction: Intravenous  PONV Risk Score and Plan: Treatment may vary due to age or medical condition  Airway Management Planned: Simple Face Mask and Mask  Additional Equipment:   Intra-op Plan:   Post-operative Plan:   Informed Consent: I have reviewed the patients History and Physical, chart, labs and discussed the procedure including the risks, benefits and alternatives for the proposed anesthesia with the patient or authorized representative who has indicated his/her understanding and acceptance.     Dental advisory given  Plan Discussed with: CRNA and Anesthesiologist  Anesthesia Plan Comments:         Anesthesia Quick Evaluation

## 2019-05-14 NOTE — H&P (View-Only) (Signed)
Progress Note  Patient Name: Nicole Norton Date of Encounter: 05/14/2019  Primary Cardiologist: Quay Burow, MD   Subjective   Still short of breath.  No chest pain or pressure.  Moderate orthopnea persists.  Feels like she still has "a lot of water."  Inpatient Medications    Scheduled Meds: . flecainide  100 mg Oral BID  . furosemide  60 mg Intravenous Q12H  . levothyroxine  225 mcg Oral QAC breakfast  . lubiprostone  24 mcg Oral BID WC  . metoprolol tartrate  12.5 mg Oral BID  . oxyCODONE  10 mg Oral Q12H  . pantoprazole  40 mg Oral Daily  . predniSONE  15 mg Oral Q breakfast   Followed by  . [START ON 05/19/2019] predniSONE  12.5 mg Oral Q breakfast  . pregabalin  75 mg Oral TID  . rivaroxaban  20 mg Oral Q supper  . senna-docusate  2 tablet Oral QHS  . sodium chloride flush  3 mL Intravenous Q12H   Continuous Infusions: . sodium chloride    . diltiazem (CARDIZEM) infusion 10 mg/hr (05/13/19 1724)   PRN Meds: sodium chloride, acetaminophen, ondansetron (ZOFRAN) IV, sodium chloride flush   Vital Signs    Vitals:   05/13/19 0834 05/13/19 1434 05/13/19 2046 05/14/19 0338  BP: 129/87 116/78 109/74   Pulse: 91 81 88   Resp: 18 18 16    Temp:   98.1 F (36.7 C)   TempSrc:   Oral   SpO2: 96% 93% 100%   Weight:    110.4 kg  Height:        Intake/Output Summary (Last 24 hours) at 05/14/2019 0946 Last data filed at 05/14/2019 0805 Gross per 24 hour  Intake 525.38 ml  Output 2101 ml  Net -1575.62 ml   Last 3 Weights 05/14/2019 05/13/2019 05/12/2019  Weight (lbs) 243 lb 4.8 oz 242 lb 3.2 oz 249 lb 3.2 oz  Weight (kg) 110.36 kg 109.861 kg 113.036 kg      Telemetry    Atrial fibrillation heart rate 75 bpm- Personally Reviewed   Physical Exam  Pleasant, obese woman, in no distress on O2 per nasal cannula GEN: No acute distress.   Neck:  Moderate JVD Cardiac:  Irregularly irregular, no murmurs, rubs, or gallops.  Respiratory: Clear to auscultation  bilaterally. GI: Soft, nontender, non-distended  MS:  Both legs are wrapped, but appears to have 2+ bilateral pretibial edema; No deformity. Neuro:  Nonfocal  Psych: Normal affect   Labs    High Sensitivity Troponin:   Recent Labs  Lab 05/10/19 1419 05/10/19 1704  TROPONINIHS 141* 365*      Cardiac EnzymesNo results for input(s): TROPONINI in the last 168 hours. No results for input(s): TROPIPOC in the last 168 hours.   Chemistry Recent Labs  Lab 05/12/19 0232 05/13/19 0219 05/14/19 0322  NA 143 143 143  K 3.3* 3.7 3.4*  CL 98 98 100  CO2 33* 33* 32  GLUCOSE 90 110* 94  BUN 25* 25* 28*  CREATININE 1.37* 1.42* 1.46*  CALCIUM 9.2 8.6* 8.5*  GFRNONAA 38* 36* 35*  GFRAA 44* 42* 40*  ANIONGAP 12 12 11      Hematology Recent Labs  Lab 05/10/19 1403 05/10/19 1420  WBC 14.8*  --   RBC 4.19  --   HGB 13.4 13.9  HCT 43.6 41.0  MCV 104.1*  --   MCH 32.0  --   MCHC 30.7  --   RDW 13.1  --  PLT 262  --     BNP Recent Labs  Lab 05/10/19 1403  BNP 773.4*     DDimer No results for input(s): DDIMER in the last 168 hours.   Radiology    No results found.  Cardiac Studies   Echo 10/20/2018: Study Conclusions  - Left ventricle: The cavity size was normal. Wall thickness was   normal. Systolic function was normal. The estimated ejection   fraction was in the range of 60% to 65%. Wall motion was normal;   there were no regional wall motion abnormalities. Features are   consistent with a pseudonormal left ventricular filling pattern,   with concomitant abnormal relaxation and increased filling   pressure (grade 2 diastolic dysfunction). Doppler parameters are   consistent with high ventricular filling pressure. - Aortic valve: Trileaflet; mildly thickened leaflets. Valve area   (VTI): 2.16 cm^2. Valve area (Vmean): 1.93 cm^2. - Mitral valve: Mildly calcified annulus. There was mild   regurgitation. - Right atrium: The atrium was mildly dilated. - Tricuspid  valve: There was mild-moderate regurgitation. - Pulmonary arteries: PA peak pressure: 39 mm Hg (S). - Systemic veins: IVC is dilated.  Patient Profile     78 y.o. female with persistent atrial fibrillation, admitted with acute diastolic heart failure and atrial fibrillation.  Assessment & Plan    1.  Symptomatic atrial fibrillation with RVR: The patient has been consistently anticoagulated with rivaroxaban without missing any doses in the last 30 days.  Plans noted for elective cardioversion today -scheduled for 1 PM.  She remains on IV diltiazem with good heart rate control at present.  Will need close follow-up with Dr. Curt Bears to determine whether further treatment such as atrial fib ablation is indicated.  2.  Acute on chronic diastolic heart failure: Diuresing well, 5.6 L negative since admission.  Weight has not come down much, only 5 pounds from admission.  Will increase her diuretic regimen today.  Recommend updating her echocardiogram since she is admitted with heart failure.  Last echo in December 2019 showed preserved LV systolic function.  She is at risk for tachycardia mediated cardiomyopathy.  3.  Elevated high-sensitivity troponin: Likely related to demand ischemia in the setting of atrial fibrillation with RVR and acute on chronic heart failure.  4.  Chronic kidney disease stage III: Creatinine essentially stable at 1.46 mg/dL this morning.  For questions or updates, please contact [place] Please consult [email or web address] for contact info under     Signed, [name], MD  05/14/2019, 9:46 AM

## 2019-05-14 NOTE — Anesthesia Procedure Notes (Signed)
Procedure Name: General with mask airway Date/Time: 05/14/2019 12:44 PM Performed by: Candis Shine, CRNA Pre-anesthesia Checklist: Patient identified, Emergency Drugs available, Suction available and Patient being monitored Patient Re-evaluated:Patient Re-evaluated prior to induction Oxygen Delivery Method: Ambu bag Preoxygenation: Pre-oxygenation with 100% oxygen Induction Type: IV induction Dental Injury: Teeth and Oropharynx as per pre-operative assessment

## 2019-05-14 NOTE — Anesthesia Postprocedure Evaluation (Signed)
Anesthesia Post Note  Patient: [name]  Procedure(s) Performed: CARDIOVERSION (N/A )     Patient location during evaluation: Endoscopy Anesthesia Type: General Level of consciousness: awake and alert Pain management: pain level controlled Vital Signs Assessment: post-procedure vital signs reviewed and stable Respiratory status: spontaneous breathing Cardiovascular status: stable Postop Assessment: no apparent nausea or vomiting Anesthetic complications: no    Last Vitals:  Vitals:   05/14/19 1335 05/14/19 1352  BP: 113/80 115/77  Pulse: 60 60  Resp: 15 16  Temp:    SpO2: 97% 99%    Last Pain:  Vitals:   05/14/19 1352  TempSrc:   PainSc: 0-No pain                 Virat Prather

## 2019-05-15 ENCOUNTER — Encounter (HOSPITAL_COMMUNITY): Payer: Self-pay | Admitting: Cardiology

## 2019-05-15 DIAGNOSIS — I5021 Acute systolic (congestive) heart failure: Secondary | ICD-10-CM

## 2019-05-15 LAB — BASIC METABOLIC PANEL
Anion gap: 10 (ref 5–15)
BUN: 27 mg/dL — ABNORMAL HIGH (ref 8–23)
CO2: 37 mmol/L — ABNORMAL HIGH (ref 22–32)
Calcium: 9.1 mg/dL (ref 8.9–10.3)
Chloride: 93 mmol/L — ABNORMAL LOW (ref 98–111)
Creatinine, Ser: 1.54 mg/dL — ABNORMAL HIGH (ref 0.44–1.00)
GFR calc Af Amer: 38 mL/min — ABNORMAL LOW (ref >60)
GFR calc non Af Amer: 33 mL/min — ABNORMAL LOW (ref >60)
Glucose, Bld: 96 mg/dL (ref 70–99)
Potassium: 3.5 mmol/L (ref 3.5–5.1)
Sodium: 140 mmol/L (ref 135–145)

## 2019-05-15 LAB — MAGNESIUM: Magnesium: 1.7 mg/dL (ref 1.7–2.4)

## 2019-05-15 MED ORDER — BUMETANIDE 2 MG PO TABS
4.0000 mg | ORAL_TABLET | Freq: Every day | ORAL | Status: DC
  Administered 2019-05-15 – 2019-05-17 (×3): 4 mg via ORAL
  Filled 2019-05-15 (×3): qty 2

## 2019-05-15 MED ORDER — METOPROLOL SUCCINATE ER 25 MG PO TB24
25.0000 mg | ORAL_TABLET | Freq: Every day | ORAL | Status: DC
  Administered 2019-05-16 – 2019-05-17 (×2): 25 mg via ORAL
  Filled 2019-05-15 (×2): qty 1

## 2019-05-15 MED ORDER — MAGNESIUM SULFATE 2 GM/50ML IV SOLN
2.0000 g | Freq: Once | INTRAVENOUS | Status: AC
  Administered 2019-05-15: 2 g via INTRAVENOUS
  Filled 2019-05-15: qty 50

## 2019-05-15 NOTE — Progress Notes (Addendum)
Progress Note  Patient Name: Nicole Norton Date of Encounter: 05/15/2019  Primary Cardiologist: Quay Burow, MD   Subjective   Remains short of breath this morning. Refusing IV lasix. Says this gives her a headache and back pain. Used Bumex in the past.   Inpatient Medications    Scheduled Meds:  flecainide  100 mg Oral BID   furosemide  80 mg Intravenous BID   levothyroxine  225 mcg Oral QAC breakfast   lubiprostone  24 mcg Oral BID WC   metoprolol tartrate  12.5 mg Oral BID   oxyCODONE  10 mg Oral Q12H   pantoprazole  40 mg Oral Daily   potassium chloride  20 mEq Oral BID   predniSONE  15 mg Oral Q breakfast   Followed by   Derrill Memo ON 05/19/2019] predniSONE  12.5 mg Oral Q breakfast   pregabalin  75 mg Oral TID   rivaroxaban  20 mg Oral Q supper   senna-docusate  2 tablet Oral QHS   sodium chloride flush  3 mL Intravenous Q12H   Continuous Infusions:  sodium chloride 250 mL (05/14/19 1237)   PRN Meds: sodium chloride, acetaminophen, ondansetron (ZOFRAN) IV, sodium chloride flush   Vital Signs    Vitals:   05/14/19 2040 05/15/19 0504 05/15/19 0507 05/15/19 0902  BP: (!) 134/92 113/77  127/86  Pulse: 78 70    Resp: 17 17    Temp: 97.6 F (36.4 C) 98.2 F (36.8 C)    TempSrc: Oral Oral    SpO2: 96% 97%  95%  Weight:   108.6 kg   Height:        Intake/Output Summary (Last 24 hours) at 05/15/2019 1106 Last data filed at 05/15/2019 1012 Gross per 24 hour  Intake 1560 ml  Output 4150 ml  Net -2590 ml   Last 3 Weights 05/15/2019 05/14/2019 05/13/2019  Weight (lbs) 239 lb 8 oz 243 lb 4.8 oz 242 lb 3.2 oz  Weight (kg) 108.636 kg 110.36 kg 109.861 kg      Telemetry    SR rates 70s  - Personally Reviewed  ECG    No new tracing this morning - Personally Reviewed  Physical Exam  Obese older WF, sitting up on the side of the bed. Wearing O2 GEN: Labored breathing   Neck: Difficult to assess JVD Cardiac: RRR, no murmurs, rubs, or  gallops.  Respiratory: Clear to auscultation bilaterally. GI: Soft, nontender, non-distended  MS: significant bilateral LE edema extending up to knees; No deformity. Neuro:  Nonfocal  Psych: Normal affect   Labs    High Sensitivity Troponin:   Recent Labs  Lab 05/10/19 1419 05/10/19 1704  TROPONINIHS 141* 365*      Cardiac EnzymesNo results for input(s): TROPONINI in the last 168 hours. No results for input(s): TROPIPOC in the last 168 hours.   Chemistry Recent Labs  Lab 05/13/19 0219 05/14/19 0322 05/15/19 0716  NA 143 143 140  K 3.7 3.4* 3.5  CL 98 100 93*  CO2 33* 32 37*  GLUCOSE 110* 94 96  BUN 25* 28* 27*  CREATININE 1.42* 1.46* 1.54*  CALCIUM 8.6* 8.5* 9.1  GFRNONAA 36* 35* 33*  GFRAA 42* 40* 38*  ANIONGAP 12 11 10      Hematology Recent Labs  Lab 05/10/19 1403 05/10/19 1420  WBC 14.8*  --   RBC 4.19  --   HGB 13.4 13.9  HCT 43.6 41.0  MCV 104.1*  --   MCH 32.0  --  MCHC 30.7  --   RDW 13.1  --   PLT 262  --     BNP Recent Labs  Lab 05/10/19 1403  BNP 773.4*     DDimer No results for input(s): DDIMER in the last 168 hours.   Radiology    No results found.  Cardiac Studies   TTE: 05/14/19  IMPRESSIONS    1. Severe hypokinesis of the left ventricular, mid-apical anteroseptal wall, anterior wall and anterolateral wall.  2. The left ventricle has severely reduced systolic function, with an ejection fraction of 25-30%. The cavity size was normal. There is mildly increased left ventricular wall thickness. Left ventricular diastolic Doppler parameters are indeterminate.  Left ventricular diffuse hypokinesis.  3. Basilar contractile sparing. Consider Takotsubo.  4. The right ventricle has normal systolic function. The cavity was mildly enlarged. There is no increase in right ventricular wall thickness. Right ventricular systolic pressure is moderately elevated with an estimated pressure of 54.2 mmHg.  5. Mild thickening of the mitral valve  leaflet. The MR jet is posteriorly-directed.  6. Tricuspid valve regurgitation is mild-moderate.  7. The aortic valve is tricuspid. Mild thickening of the aortic valve. Mild calcification of the aortic valve.  Patient Profile     78 y.o. female with persistent atrial fibrillation, admitted with acute diastolic heart failure and atrial fibrillation.  Assessment & Plan    1.  Symptomatic atrial fibrillation with RVR: Underwent successful DCCV yesterday and has maintained SR. IV dilt was stopped, but continued on metoprolol with HR stable. Remains on Xarelto.  Followed by Dr. Curt Bears who manages her flecainide. Different therapy with new decline in EF?   2.  Acute on chronic diastolic heart failure with new systolic HF: Diuresing well, 8.1L negative since admission.  Weight down 4 lbs overnight. Her lasix was increased to 80mg  BID yesterday, but she is refusing lasix this morning. States it gives her a headache and back pain. Has used bumex in the past. Talked with PharmD and will dose Bumex 4mg  PO now. Follow response.  -- Echo yesterday with new severe LV dysfunction, EF 25-30%. Suspect this is tachy mediated, though will discuss with MD whether ischemic work up is warranted.  -- on metoprolol 12.5mg  BID, will switch to Toprol 25mg . Will hold on ARB/spiro at this time with elevated Cr.  3.  Elevated high-sensitivity troponin: Peaked at 365. Likely related to demand ischemia in the setting of atrial fibrillation with RVR and acute on chronic heart failure. Plan as above.  4.  Chronic kidney disease stage III: Creatinine with mild increase from 1.4>>1.54.  - follow BMET  For questions or updates, please contact [place] Please consult [email or web address] for contact info under     Signed, Reino Bellis, NP  05/15/2019, 11:06 AM    Patient seen, examined. Available data reviewed. Agree with findings, assessment, and plan as outlined by Reino Bellis, NP.  The patient is independently  interviewed and examined.  JVP remains elevated, lungs are diminished in the bases but otherwise clear, heart is regular rate and rhythm without murmur or gallop.  Abdomen is soft obese and nontender, extremities have 1+ pretibial edema.  The patient's echocardiogram demonstrates severe LV systolic dysfunction with LVEF less than 30%.  The wall motion abnormality pattern is consistent with acute Takotsubo's syndrome.  The tachycardia mediated cardiomyopathy is less likely with segmental wall motion abnormality but also possibility.  I think coronary artery disease is lower on the differential diagnosis.  Cardiac catheterization would be a  consideration to evaluate for obstructive CAD, but in this patient with recent cardioversion, heart failure, no angina, as well as stage III chronic kidney disease, I think the risk of cardiac catheterization outweighs any potential benefit.  The patient is refusing IV Lasix because of flank pain that she attributes to this.  Her weight is down 9 pounds from admission and her I's/O's are -8.7 L.  She has just received a dose of oral Bumex 4 mg.  We will see how she responds to this.  She is treated with metoprolol succinate.  I do not think we should start her on an ACE/ARB yet as she needs further diuresis and has chronic kidney disease, increasing her risk for acute kidney injury.    Regarding her atrial fibrillation, we will stop flecainide in the setting of severe LV dysfunction.  We will start oral amiodarone and I discussed the pros and cons of this with the patient today.  I need to check with the pharmacy to see if amiodarone can be started immediately after stopping flecainide.  We will follow-up tomorrow morning.  Consider a dose of metolazone before her morning Bumex tomorrow.  She needs further diuresis.    Nicole Norton, M.D. 05/15/2019 2:42 PM

## 2019-05-15 NOTE — Progress Notes (Signed)
Pt requesting all four bed rails to be raised

## 2019-05-15 NOTE — Progress Notes (Signed)
Patient wants RN to hold on to lasix until MD rounds and she speak to them about it,because she feels it is hurting her back and giving her headaches. Also asked RN to removed  Unna boots.

## 2019-05-15 NOTE — Plan of Care (Signed)
  Problem: Education: Goal: Ability to demonstrate management of disease process will improve Outcome: Progressing Goal: Ability to verbalize understanding of medication therapies will improve Outcome: Progressing   Problem: Activity: Goal: Capacity to carry out activities will improve Outcome: Progressing   Problem: Education: Goal: Knowledge of General Education information will improve Description: Including pain rating scale, medication(s)/side effects and non-pharmacologic comfort measures Outcome: Progressing   Problem: Health Behavior/Discharge Planning: Goal: Ability to manage health-related needs will improve Outcome: Progressing   Problem: Clinical Measurements: Goal: Ability to maintain clinical measurements within normal limits will improve Outcome: Progressing

## 2019-05-15 NOTE — Care Management Important Message (Signed)
Important Message  Patient Details  Name: Nicole Norton MRN: [id number] Date of Birth: 1941-07-28   Medicare Important Message Given:  Yes     Shelda Altes 05/15/2019, 12:27 PM

## 2019-05-16 DIAGNOSIS — I5023 Acute on chronic systolic (congestive) heart failure: Secondary | ICD-10-CM

## 2019-05-16 LAB — BASIC METABOLIC PANEL
Anion gap: 11 (ref 5–15)
BUN: 28 mg/dL — ABNORMAL HIGH (ref 8–23)
CO2: 39 mmol/L — ABNORMAL HIGH (ref 22–32)
Calcium: 9.2 mg/dL (ref 8.9–10.3)
Chloride: 93 mmol/L — ABNORMAL LOW (ref 98–111)
Creatinine, Ser: 1.57 mg/dL — ABNORMAL HIGH (ref 0.44–1.00)
GFR calc Af Amer: 37 mL/min — ABNORMAL LOW (ref >60)
GFR calc non Af Amer: 32 mL/min — ABNORMAL LOW (ref >60)
Glucose, Bld: 96 mg/dL (ref 70–99)
Potassium: 3.2 mmol/L — ABNORMAL LOW (ref 3.5–5.1)
Sodium: 143 mmol/L (ref 135–145)

## 2019-05-16 LAB — MAGNESIUM: Magnesium: 2.1 mg/dL (ref 1.7–2.4)

## 2019-05-16 MED ORDER — LUBIPROSTONE 24 MCG PO CAPS
24.0000 ug | ORAL_CAPSULE | Freq: Two times a day (BID) | ORAL | Status: DC
  Administered 2019-05-17: 24 ug via ORAL
  Filled 2019-05-16: qty 1

## 2019-05-16 MED ORDER — METOLAZONE 2.5 MG PO TABS
2.5000 mg | ORAL_TABLET | Freq: Once | ORAL | Status: AC
  Administered 2019-05-16: 2.5 mg via ORAL
  Filled 2019-05-16: qty 1

## 2019-05-16 MED ORDER — PREDNISONE 5 MG PO TABS
15.0000 mg | ORAL_TABLET | Freq: Every day | ORAL | Status: DC
  Administered 2019-05-17: 15 mg via ORAL
  Filled 2019-05-16: qty 1

## 2019-05-16 MED ORDER — AMIODARONE HCL 200 MG PO TABS
200.0000 mg | ORAL_TABLET | Freq: Two times a day (BID) | ORAL | Status: DC
  Administered 2019-05-16 – 2019-05-17 (×3): 200 mg via ORAL
  Filled 2019-05-16 (×3): qty 1

## 2019-05-16 MED ORDER — LORATADINE 10 MG PO TABS
10.0000 mg | ORAL_TABLET | Freq: Every day | ORAL | Status: DC
  Administered 2019-05-16 – 2019-05-17 (×2): 10 mg via ORAL
  Filled 2019-05-16 (×2): qty 1

## 2019-05-16 MED ORDER — PREDNISONE 5 MG PO TABS: 12.5000 mg | ORAL_TABLET | Freq: Every day | ORAL | Status: DC

## 2019-05-16 MED ORDER — POTASSIUM CHLORIDE CRYS ER 20 MEQ PO TBCR
40.0000 meq | EXTENDED_RELEASE_TABLET | Freq: Once | ORAL | Status: AC
  Administered 2019-05-16: 12:00:00 40 meq via ORAL
  Filled 2019-05-16: qty 2

## 2019-05-16 NOTE — Progress Notes (Addendum)
Progress Note  Patient Name: Nicole Norton Date of Encounter: 05/16/2019  Primary Cardiologist: Quay Burow, MD   Subjective   Feels better today. Breathing somewhat improved.   Inpatient Medications    Scheduled Meds: . amiodarone  200 mg Oral BID  . bumetanide  4 mg Oral Daily  . levothyroxine  225 mcg Oral QAC breakfast  . lubiprostone  24 mcg Oral BID WC  . metoprolol succinate  25 mg Oral Daily  . oxyCODONE  10 mg Oral Q12H  . pantoprazole  40 mg Oral Daily  . potassium chloride  20 mEq Oral BID  . predniSONE  15 mg Oral Q breakfast   Followed by  . [START ON 05/19/2019] predniSONE  12.5 mg Oral Q breakfast  . pregabalin  75 mg Oral TID  . rivaroxaban  20 mg Oral Q supper  . senna-docusate  2 tablet Oral QHS  . sodium chloride flush  3 mL Intravenous Q12H   Continuous Infusions: . sodium chloride 250 mL (05/14/19 1237)   PRN Meds: sodium chloride, acetaminophen, ondansetron (ZOFRAN) IV, sodium chloride flush   Vital Signs    Vitals:   05/15/19 1228 05/15/19 2228 05/16/19 0247 05/16/19 0538  BP: 113/75 127/82  114/71  Pulse: 74 70  62  Resp: 17   20  Temp: 98.5 F (36.9 C)   98 F (36.7 C)  TempSrc: Oral   Oral  SpO2: 95%   100%  Weight:   106.6 kg   Height:        Intake/Output Summary (Last 24 hours) at 05/16/2019 0924 Last data filed at 05/16/2019 0756 Gross per 24 hour  Intake 1280 ml  Output 3100 ml  Net -1820 ml   Last 3 Weights 05/16/2019 05/15/2019 05/14/2019  Weight (lbs) 235 lb 239 lb 8 oz 243 lb 4.8 oz  Weight (kg) 106.595 kg 108.636 kg 110.36 kg      Telemetry    SR - Personally Reviewed  ECG    No tracing this morning - Personally Reviewed  Physical Exam  Older obese WF GEN: No acute distress.   Neck: No JVD Cardiac: RRR, no murmurs, rubs, or gallops.  Respiratory: Clear to auscultation bilaterally. GI: Soft, nontender, non-distended  MS: 2+ LE edema; No deformity. Neuro:  Nonfocal  Psych: Normal affect   Labs   High Sensitivity Troponin:   Recent Labs  Lab 05/10/19 1419 05/10/19 1704  TROPONINIHS 141* 365*      Cardiac EnzymesNo results for input(s): TROPONINI in the last 168 hours. No results for input(s): TROPIPOC in the last 168 hours.   Chemistry Recent Labs  Lab 05/13/19 0219 05/14/19 0322 05/15/19 0716  NA 143 143 140  K 3.7 3.4* 3.5  CL 98 100 93*  CO2 33* 32 37*  GLUCOSE 110* 94 96  BUN 25* 28* 27*  CREATININE 1.42* 1.46* 1.54*  CALCIUM 8.6* 8.5* 9.1  GFRNONAA 36* 35* 33*  GFRAA 42* 40* 38*  ANIONGAP 12 11 10      Hematology Recent Labs  Lab 05/10/19 1403 05/10/19 1420  WBC 14.8*  --   RBC 4.19  --   HGB 13.4 13.9  HCT 43.6 41.0  MCV 104.1*  --   MCH 32.0  --   MCHC 30.7  --   RDW 13.1  --   PLT 262  --     BNP Recent Labs  Lab 05/10/19 1403  BNP 773.4*     DDimer No results for input(s): DDIMER  in the last 168 hours.   Radiology    No results found.  Cardiac Studies   TTE: 05/14/19  IMPRESSIONS   1. Severe hypokinesis of the left ventricular, mid-apical anteroseptal wall, anterior wall and anterolateral wall. 2. The left ventricle has severely reduced systolic function, with an ejection fraction of 25-30%. The cavity size was normal. There is mildly increased left ventricular wall thickness. Left ventricular diastolic Doppler parameters are indeterminate.  Left ventricular diffuse hypokinesis. 3. Basilar contractile sparing. Consider Takotsubo. 4. The right ventricle has normal systolic function. The cavity was mildly enlarged. There is no increase in right ventricular wall thickness. Right ventricular systolic pressure is moderately elevated with an estimated pressure of 54.2 mmHg. 5. Mild thickening of the mitral valve leaflet. The MR jet is posteriorly-directed. 6. Tricuspid valve regurgitation is mild-moderate. 7. The aortic valve is tricuspid. Mild thickening of the aortic valve. Mild calcification of the aortic valve.  Patient  Profile     78 y.o. female with persistent atrial fibrillation, admitted with acute diastolic heart failure and atrial fibrillation.  Assessment & Plan    1. Symptomatic atrial fibrillation with RVR: Underwent successful DCCV and has maintained SR. IV dilt was stopped, but continued on metoprolol with HR stable. Remains on Xarelto.  Followed by Dr. Curt Bears who managed her flecainide. -- Dr. Burt Knack discussed with EP and felt appropriate to switch to amiodarone. Will start 200mg  BID today.  -- continue Xarelto  2. Acute on chronic diastolic heart failure with new systolic HF: Diuresing well, 9.9L negative since admission. Weight down 4 lbs overnight. 248>>235lbs since admission. Initially on lasix but refused 2/2 to side effects of headache, and back pain. Now on Bumex 4mg .  -- pending renal function will dose with metolazone 2.5mg  x1  -- Echo with new severe LV dysfunction, EF 25-30%. Suspect this is tachy mediated.  -- on Toprol 25mg . Will hold on ARB/spiro at this time with elevated Cr.  3. Elevated high-sensitivity troponin: Peaked at 365. Likely related to demand ischemia in the setting of atrial fibrillation with RVR and acute on chronic heart failure. Plan as above.  4. Chronic kidney disease stage III: Creatinine with mild increase from 1.4>>1.54. Pending this morning. - follow BMET  For questions or updates, please contact [employer] Please consult [email or web address] for contact info under    Signed, Reino Bellis, NP  05/16/2019, 9:24 AM    Patient seen, examined. Available data reviewed. Agree with findings, assessment, and plan as outlined by Reino Bellis, NP. On my exam today: Vitals:   05/16/19 0538 05/16/19 1116  BP: 114/71 123/71  Pulse: 62 64  Resp: 20 17  Temp: 98 F (36.7 C) 98.1 F (36.7 C)  SpO2: 100% 99%   Pt is alert and oriented, pleasant obese woman in NAD HEENT: normal Neck: JVP - mildly elevated Lungs: coarse BS bilaterally CV: RRR without  murmur or gallop Abd: soft, NT, Positive BS, no hepatomegaly Ext: trace pretibial edema bilaterally, distal pulses intact and equal Skin: warm/dry no rash  The patient continues to improve.  Weight is now down 13 pounds from admission.  She continues to diurese well.  This morning she was given metolazone and Bumex.  We have switched her from flecainide to amiodarone since she now has severe LV dysfunction.  We are holding on ACE/ARB because of kidney disease and ongoing diuresis with risk of AKI.  I discussed the patient's disposition with her and I am hopeful that she will be ready for hospital  discharge in the next 48 hours.  I would anticipate discharging her on Bumex with as needed metolazone based on a sliding scale for daily weights.  I have updated her husband over the telephone.  [name], M.D. 05/16/2019 2:37 PM

## 2019-05-16 NOTE — Plan of Care (Signed)
°  Problem: Education: °Goal: Ability to demonstrate management of disease process will improve °Outcome: Progressing °Goal: Ability to verbalize understanding of medication therapies will improve °Outcome: Progressing °Goal: Individualized Educational Video(s) °Outcome: Progressing °  °

## 2019-05-16 NOTE — Progress Notes (Signed)
Patient did want want RN to call and update husband. She states she calls him 3 times a day.

## 2019-05-16 NOTE — Progress Notes (Signed)
Orthopedic Tech Progress Note Patient Details:  Nicole Norton [birth year] [id number] Martin Majestic to apply fresh pair of unna boots for patient and the secretary did let me know that the patient had taken them off a day or 2 ago. Went and asked patient if she would like a new pair and she said "no thank you" Patient ID: Nicole Norton, female   DOB: 1941/08/14, 78 y.o.   MRN: [id number]   Janit Pagan 05/16/2019, 11:10 AM

## 2019-05-17 LAB — BASIC METABOLIC PANEL WITH GFR
Anion gap: 9 (ref 5–15)
BUN: 30 mg/dL — ABNORMAL HIGH (ref 8–23)
CO2: 41 mmol/L — ABNORMAL HIGH (ref 22–32)
Calcium: 9.2 mg/dL (ref 8.9–10.3)
Chloride: 94 mmol/L — ABNORMAL LOW (ref 98–111)
Creatinine, Ser: 1.67 mg/dL — ABNORMAL HIGH (ref 0.44–1.00)
GFR calc Af Amer: 34 mL/min — ABNORMAL LOW
GFR calc non Af Amer: 30 mL/min — ABNORMAL LOW
Glucose, Bld: 88 mg/dL (ref 70–99)
Potassium: 3.7 mmol/L (ref 3.5–5.1)
Sodium: 144 mmol/L (ref 135–145)

## 2019-05-17 MED ORDER — BUMETANIDE 2 MG PO TABS: 2.0000 mg | ORAL_TABLET | Freq: Every day | ORAL | Status: DC

## 2019-05-17 MED ORDER — PREDNISONE 5 MG PO TABS: 15.0000 mg | ORAL_TABLET | Freq: Every day | ORAL | Status: DC

## 2019-05-17 MED ORDER — PREDNISONE 5 MG PO TABS: 5.0000 mg | ORAL_TABLET | Freq: Every day | ORAL | Status: DC

## 2019-05-17 MED ORDER — METOPROLOL SUCCINATE ER 25 MG PO TB24: 25.0000 mg | ORAL_TABLET | Freq: Every day | ORAL | 1 refills | Status: DC

## 2019-05-17 MED ORDER — PREDNISONE 5 MG PO TABS: 7.5000 mg | ORAL_TABLET | Freq: Every day | ORAL | Status: DC

## 2019-05-17 MED ORDER — POTASSIUM CHLORIDE CRYS ER 20 MEQ PO TBCR: 20.0000 meq | EXTENDED_RELEASE_TABLET | Freq: Two times a day (BID) | ORAL | 1 refills | Status: DC

## 2019-05-17 MED ORDER — PREDNISONE 5 MG PO TABS: ORAL_TABLET | ORAL | 0 refills | Status: DC

## 2019-05-17 MED ORDER — AMIODARONE HCL 200 MG PO TABS: ORAL_TABLET | ORAL | 1 refills | Status: DC

## 2019-05-17 MED ORDER — PREDNISONE 5 MG PO TABS: 12.5000 mg | ORAL_TABLET | Freq: Every day | ORAL | Status: DC

## 2019-05-17 MED ORDER — METOLAZONE 2.5 MG PO TABS: ORAL_TABLET | ORAL | 0 refills | Status: DC

## 2019-05-17 MED ORDER — BUMETANIDE 2 MG PO TABS: 2.0000 mg | ORAL_TABLET | Freq: Every day | ORAL | 1 refills | Status: DC

## 2019-05-17 MED ORDER — PREDNISONE 10 MG PO TABS: 10.0000 mg | ORAL_TABLET | Freq: Every day | ORAL | Status: DC

## 2019-05-17 MED FILL — BUMETANIDE 2 MG TABS: 2 | 30 days supply | Qty: 30 | Fill #0

## 2019-05-17 MED FILL — POTASSIUM CL ER 20 MEQ TABL: 20 | 30 days supply | Qty: 60 | Fill #0

## 2019-05-17 MED FILL — METOPROLOL SUCCINATE ER 25: 25 | 30 days supply | Qty: 30 | Fill #0

## 2019-05-17 MED FILL — metOLazone 2.5 MG TABS: 2.5 | 30 days supply | Qty: 30 | Fill #0

## 2019-05-17 MED FILL — AMIODARONE HCL 200 MG TAB: 200 | 30 days supply | Qty: 60 | Fill #0

## 2019-05-17 NOTE — Discharge Summary (Signed)
Discharge Summary    Patient ID: Nicole Norton,  MRN: [id number], DOB/AGE: [birth year] 78 y.o.  Admit date: 05/10/2019 Discharge date: 05/17/2019  Primary Care Provider: Hali Marry Primary Cardiologist: Dr. [name] Found   Discharge Diagnoses    Principal Problem:   Acute on chronic diastolic HF (heart failure) [facility]) Active Problems:   Essential hypertension, benign   Hyperlipidemia   Atrial fibrillation with RVR (HCC)   CKD (chronic kidney disease) stage 3, GFR 30-59 ml/min (HCC)   Allergies Allergies  Allergen Reactions  . Allopurinol Palpitations  . Uloric [Febuxostat] Other (See Comments)    Episodes of AFIB  . Ace Inhibitors Other (See Comments)    Renal function  . Colchicine Other (See Comments)    palpitations  . Cymbalta [Duloxetine Hcl] Other (See Comments)    palpitations  . Etodolac Other (See Comments)    heart flutters and headaches   . Fluoxetine Other (See Comments)    Jitteriness  . Omeprazole Nausea Only  . Paroxetine Nausea Only    Dizzy  . Solu-Medrol [Methylprednisolone Acetate]     Heart palpitations/bad headache  . Statins Tinitus    REACTION: myalgias REACTION: myalgias  . Lasix [Furosemide] Rash    Does not work very well  . Penicillins Rash    Tolerated Ceftriaxone 4/10-4/11/20 \ Did it involve swelling of the face/tongue/throat, SOB, or low BP? No Did it involve sudden or severe rash/hives, skin peeling, or any reaction on the inside of your mouth or nose? No Did you need to seek medical attention at a hospital or doctor's office? No When did it last happen?35 years ago If all above answers are "NO", may proceed with cephalosporin use.    Diagnostic Studies/Procedures    DCCV: 05/16/19 _____________   History of Present Illness     Ms. Perfecto is a 78 yo female with PMH of atrial fibrillation, chronic anticoagulation, diastolic dysfunction HTN and HLD w/ statin intolerance followed by Dr. [name] Found and Dr.  Curt Bears. She has a hx of atrial fibrillation, chronic anticoagulation, diastolic dysfunction, HTN and HLD w/ statin intolerance. No h/o CAD. She has had several stress test in the past that have been normal. Last echo 10/20/2018 showed normal LVEF at 60-65%, G2DD, mild MR and mild-moderate MR and mild pulmonary HTN, PA pressure was 39. 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 Since then, she has been on home 02 and treated w/ prednisone. She has also had difficulties with weight gain, edema and dyspnea since April. She was on Lasix but not responding. Then changed to Bumex. She had a good initial response but then diuresis slowed and she had to double the dose. This worked for a while but then she started to re accumulate fluid and gain weight. symptoms have progressed. She reports avoidance of salt. Symptoms worsened prompting her to come to the ED.   She presented to [facility] ED w/ CC of worsening dyspnea and weight gain. COVID negative. CXR showed stable cardiomegaly with central pulmonary vascular congestion and possible bilateral pulmonary edema. BNP is elevated at 773.4. SCr was 1.22 c/w baseline. EKG showed rapid atrial fibrillation 130 bpm. K was WNL at 3.5. She reported full compliance w/ Xarelto. No missed doses in the last 30 days.   Of note, she had been treated w/ flecainide and Cardizem. She was on Xarelto for a/c. It was noted that she had recurrence of her afib recently after developing CAP in April. She had  f/u with Dr. Curt Bears (televisit) on 03/29/19 and he noted that she would likely need repeat cardioversion to restore NSR. This had not yet been arranged. He also mentioned possible ablation.    Hospital Course     1. Symptomatic atrial fibrillation with OYD:XAJOIN on IV diltiazem on admission. Underwent successful DCCV and has maintained SR.IV dilt was stopped, but continued onmetoprololwith HR stable. Remained on Xarelto. Followed by Dr. Curt Bears who managedher  flecainide. -- Dr. Burt Knack discussed with EP and felt appropriate to switch to amiodarone given her new dx of systolic HF. Tolerating amiodarone 200mg  BID. Will continue the same for 2 weeks, then reduce to 200mg  daily.  -- continue Xarelto  2. Acute on chronic diastolic heart failurewith new systolic OM:VEHMCNOBS JGGE,36.6Q negative since admission. Weightdown 15lbs total. 248>>233lbs since admission. Initially on lasix but refused 2/2 to side effects of headache, and back pain. Placed on Bumex 4mg , Cr did rise to 1.6. Will plan reduce back to 2mg  at the time of discharge. As well as PRN metolazone 2.5mg  for weight gain of >3lbs above baseline.  -- Echo with new severe LV dysfunction, EF 25-30%.Suspect this is tachy mediated. -- on Toprol 25mg . Will hold on ARB/spiro at this time with elevated Cr. Consider resuming as an outpatient if renal function is stable.   3. Elevated high-sensitivity troponin:Peaked at 365.Likely related to demand ischemia in the setting of atrial fibrillation with RVR and acute on chronic heart failure. Plan as above.  4. Chronic kidney disease stage HUT:MLYYTKPTWS with mild increase from 1.4>>1.54>>1.67.Bumex reduced to 2mg  daily.  - BMET at outpatient follow.  5. Hx of eosinophilic CAP: her prednisone taper was continued at discharge with plans to follow up with pulmonary.   Nicole Norton was seen by Dr. Burt Knack and determined stable for discharge home. Follow up in the office has been arranged. Medications are listed below.   _____________  Discharge Vitals Blood pressure 118/80, pulse 61, temperature 97.9 F (36.6 C), resp. rate 18, height 5\' 6"  (1.676 m), weight 106 kg, SpO2 100 %.  Filed Weights   05/15/19 0507 05/16/19 0247 05/17/19 0625  Weight: 108.6 kg 106.6 kg 106 kg    Labs & Radiologic Studies    CBC No results for input(s): WBC, NEUTROABS, HGB, HCT, MCV, PLT in the last 72 hours. Basic Metabolic Panel Recent Labs     05/15/19 0716 05/16/19 0256 05/16/19 1010 05/17/19 0433  NA 140  --  143 144  K 3.5  --  3.2* 3.7  CL 93*  --  93* 94*  CO2 37*  --  39* 41*  GLUCOSE 96  --  96 88  BUN 27*  --  28* 30*  CREATININE 1.54*  --  1.57* 1.67*  CALCIUM 9.1  --  9.2 9.2  MG 1.7 2.1  --   --    Liver Function Tests No results for input(s): AST, ALT, ALKPHOS, BILITOT, PROT, ALBUMIN in the last 72 hours. No results for input(s): LIPASE, AMYLASE in the last 72 hours. Cardiac Enzymes No results for input(s): CKTOTAL, CKMB, CKMBINDEX, TROPONINI in the last 72 hours. BNP Invalid input(s): POCBNP D-Dimer No results for input(s): DDIMER in the last 72 hours. Hemoglobin A1C No results for input(s): HGBA1C in the last 72 hours. Fasting Lipid Panel No results for input(s): CHOL, HDL, LDLCALC, TRIG, CHOLHDL, LDLDIRECT in the last 72 hours. Thyroid Function Tests No results for input(s): TSH, T4TOTAL, T3FREE, THYROIDAB in the last 72 hours.  Invalid input(s): FREET3 _____________  Dg Chest Port 1 View  Result Date: 05/10/2019 CLINICAL DATA:  Shortness of breath, chest pain. EXAM: PORTABLE CHEST 1 VIEW COMPARISON:  Radiographs of Mar 27, 2019. FINDINGS: Stable cardiomegaly with central pulmonary vascular congestion. No pneumothorax or pleural effusion is noted. Increased interstitial densities are noted throughout both lungs which may represent pulmonary edema. Bony thorax is unremarkable. IMPRESSION: Stable cardiomegaly with central pulmonary vascular congestion and possible bilateral pulmonary edema. Electronically Signed   By: Marijo Conception M.D.   On: 05/10/2019 15:18   Disposition   Pt is being discharged home today in good condition.  Follow-up Plans & Appointments    Follow-up Information    Barrett, Evelene Croon, PA-C Follow up on 05/24/2019.   Specialties: Cardiology, Radiology Why: at 11:15am for your follow up appt.  Contact information: [address] [place] [address] [phone]           Discharge Instructions    (HEART FAILURE PATIENTS) Call MD:  Anytime you have any of the following symptoms: 1) 3 pound weight gain in 24 hours or 5 pounds in 1 week 2) shortness of breath, with or without a dry hacking cough 3) swelling in the hands, feet or stomach 4) if you have to sleep on extra pillows at night in order to breathe.   Complete by: As directed    Diet - low sodium heart healthy   Complete by: As directed    Discharge instructions   Complete by: As directed    Please take Bumex 2mg  daily with as need metolazone 2.5mg  for weight gain of >3lbs.   Increase activity slowly   Complete by: As directed        Discharge Medications     Medication List    STOP taking these medications   buPROPion 150 MG 24 hr tablet Commonly known as: Wellbutrin XL   diltiazem 30 MG tablet Commonly known as: CARDIZEM   flecainide 100 MG tablet Commonly known as: TAMBOCOR   furosemide 40 MG tablet Commonly known as: LASIX   metoprolol tartrate 25 MG tablet Commonly known as: LOPRESSOR     TAKE these medications   AMBULATORY NON FORMULARY MEDICATION On continuous oxygen 2 L.  Stationary pulse ox at 88%.  Drops with activity.  Diagnosis restrictive lung disease with hypoxemia. Portable gas via nasal cannula. What changed:   how much to take  how to take this  when to take this  additional instructions   amiodarone 200 MG tablet Commonly known as: PACERONE Take 200mg  twice a day for 2 weeks, then reduce dose to 200mg  daily.   Amitiza 24 MCG capsule Generic drug: lubiprostone Take 24 mcg by mouth 2 (two) times daily with a meal.   bumetanide 2 MG tablet Commonly known as: BUMEX Take 1 tablet (2 mg total) by mouth daily. Start taking on: May 18, 2019 What changed:   medication strength  how much to take  additional instructions   CENTRUM SILVER PO Take 1 tablet by mouth daily.   cholecalciferol 25 MCG (1000 UT) tablet Commonly known as:  VITAMIN D3 Take 1,000 Units by mouth daily.   diclofenac 1.3 % Ptch Commonly known as: FLECTOR Place 1 patch onto the skin every 12 (twelve) hours.   EPINEPHrine 0.3 mg/0.3 mL Soaj injection Commonly known as: EPI-PEN Inject 0.3 mg into the muscle once.   ferrous sulfate 325 (65 FE) MG tablet Take 1 tablet (325 mg total) by mouth every Monday, Wednesday, and Friday.  Fish Oil 1200 MG Caps Take 1,200 mg by mouth daily.   lansoprazole 30 MG capsule Commonly known as: PREVACID Take 1 capsule (30 mg total) by mouth daily. Intolerant to omeprazole.  On chronic steroids.   levothyroxine 25 MCG tablet Commonly known as: SYNTHROID TAKE ONE TABLET EVERY DAY BEFORE BREAKFAST What changed: See the new instructions.   levothyroxine 200 MCG tablet Commonly known as: SYNTHROID Take 1 tablet (200 mcg total) by mouth daily before breakfast. What changed: Another medication with the same name was changed. Make sure you understand how and when to take each.   metolazone 2.5 MG tablet Commonly known as: ZAROXOLYN Take one tablet as needed for weight gain of 3lbs from baseline.   metoprolol succinate 25 MG 24 hr tablet Commonly known as: TOPROL-XL Take 1 tablet (25 mg total) by mouth daily. Start taking on: May 18, 2019   Nucala 100 MG/ML Soaj Generic drug: Mepolizumab Inject 100 mg into the skin every 30 (thirty) days.   Oxycodone HCl 10 MG Tabs Take 10 mg by mouth 4 (four) times daily.   potassium chloride SA 20 MEQ tablet Commonly known as: K-DUR Take 1 tablet (20 mEq total) by mouth 2 (two) times daily.   predniSONE 5 MG tablet Commonly known as: DELTASONE Take 3 tablets (15 mg total) by mouth daily with breakfast for 1 day, THEN 2.5 tablets (12.5 mg total) daily with breakfast for 7 days, THEN 2 tablets (10 mg total) daily with breakfast for 7 days, THEN 1.5 tablets (7.5 mg total) daily with breakfast for 7 days, THEN 1 tablet (5 mg total) daily with breakfast for 7 days.  Start taking on: May 18, 2019 What changed:   medication strength  See the new instructions.   pregabalin 75 MG capsule Commonly known as: LYRICA Take 75 mg by mouth 3 (three) times daily.   Premarin 0.625 MG tablet Generic drug: estrogens (conjugated) TAKE 1 TABLET DAILY AS NEEDED What changed:   how much to take  when to take this  additional instructions   rivaroxaban 20 MG Tabs tablet Commonly known as: Xarelto Take 1 tablet (20 mg total) by mouth daily with supper.   sucralfate 1 GM/10ML suspension Commonly known as: Carafate Take 10 mLs (1 g total) by mouth as needed. What changed: reasons to take this   vitamin B-12 500 MCG tablet Commonly known as: CYANOCOBALAMIN Take 500 mcg by mouth every Monday, Wednesday, and Friday.   ZyrTEC 10 MG tablet Generic drug: cetirizine Take 10 mg by mouth daily.        Acute coronary syndrome (MI, NSTEMI, STEMI, etc) this admission?: No.     Outstanding Labs/Studies   BMET at follow up appt.  Duration of Discharge Encounter   Greater than 30 minutes including physician time.  Signed, Reino Bellis NP-C 05/17/2019, 12:08 PM

## 2019-05-17 NOTE — Progress Notes (Signed)
Nicole Norton to be D/C'd per MD order. Discussed with the patient and all questions fully answered. ? VSS, Skin clean, dry and intact without evidence of skin break down, no evidence of skin tears noted. ? IV catheter discontinued intact. Site without signs and symptoms of complications. Dressing and pressure applied. ? An After Visit Summary was printed and given to the patient. Patient informed where to pickup prescriptions. ? D/c education completed with patient/family including follow up instructions, medication list, d/c activities limitations if indicated, with other d/c instructions as indicated by MD - patient able to verbalize understanding, all questions fully answered.  ? Patient instructed to return to ED, call 911, or call MD for any changes in condition.  ? Patient to be escorted via [place], and D/C home via private auto.

## 2019-05-17 NOTE — Consult Note (Signed)
   [facility] Inpatient Consult   05/17/2019  Sherion Dooly [birth year] [id number]    Patientchecked ifpotential [place] Management services are neededunder her Medicare/ [employer] plan. Review of patient's medical record reveals that she has 2 hospitalizations in the past 6 months, with 24% high risk score for unplanned readmissions.  Per chart review and MD note dated 05/17/19 show as follows:  Ms.Johnsonis a 78 yo female with PMH of atrial fibrillation, chronic anticoagulation, diastolic dysfunction HTN and HLD w/ statin intolerance followed by Dr. [name] Found and Dr. Curt Bears. She hasa hx ofatrial fibrillation, chronic anticoagulation, diastolic dysfunction, HTN and HLD w/ statin intolerance. No h/o CAD. She has had several stress test in the past that have been normal. 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 Since then, she has been on home 02 and treated w/ prednisone. She has also had difficulties with weight gain, edema and dyspnea since April. She was on Lasix but not responding. Then changed to Bumex. She had a good initial response but then diuresis slowed and she had to double the dose. This worked for a while but then she started to re accumulate fluid and gain weight. symptoms have progressed. She reports avoidance of salt.  She presented to the [facility] ED w/ CC ofworseningdyspneaand weightgain. COVID negative.  (Symptomatic atrial fibrillation with RVR, Acute on chronic diastolic heart failurewith new systolic HF, s/p cardioversion) Patient lives at home with spouse.  Primary Care Provider isDr. Beatrice Lecher with [facility] at [place].  Patient had transitionedto home prior to speaking with her. There are noTHN Care Management needs noted at this time.  Patient could benefit with  General EMMI calls post discharge to monitor her recovery.   For questions and additional information, please call:   Jolinda Pinkstaff A. Jesus Nevills, BSN, RN-BC [facility] Liaison Cell: [phone]

## 2019-05-17 NOTE — Progress Notes (Addendum)
Progress Note  Patient Name: Nicole Norton Date of Encounter: 05/17/2019  Primary Cardiologist: Quay Burow, MD   Subjective   Resting in bed this morning. Feeling much better overall.   Inpatient Medications    Scheduled Meds: . amiodarone  200 mg Oral BID  . bumetanide  4 mg Oral Daily  . levothyroxine  225 mcg Oral QAC breakfast  . loratadine  10 mg Oral Daily  . lubiprostone  24 mcg Oral BID WC  . metoprolol succinate  25 mg Oral Daily  . oxyCODONE  10 mg Oral Q12H  . pantoprazole  40 mg Oral Daily  . potassium chloride  20 mEq Oral BID  . predniSONE  15 mg Oral Q breakfast   Followed by  . [START ON 05/19/2019] predniSONE  12.5 mg Oral Q breakfast  . pregabalin  75 mg Oral TID  . rivaroxaban  20 mg Oral Q supper  . senna-docusate  2 tablet Oral QHS  . sodium chloride flush  3 mL Intravenous Q12H   Continuous Infusions: . sodium chloride 250 mL (05/14/19 1237)   PRN Meds: sodium chloride, acetaminophen, ondansetron (ZOFRAN) IV, sodium chloride flush   Vital Signs    Vitals:   05/16/19 0538 05/16/19 1116 05/16/19 2117 05/17/19 0625  BP: 114/71 123/71 113/75 118/80  Pulse: 62 64 69 61  Resp: 20 17 18 18   Temp: 98 F (36.7 C) 98.1 F (36.7 C) 98.6 F (37 C) 97.9 F (36.6 C)  TempSrc: Oral Oral Oral   SpO2: 100% 99% 98% 100%  Weight:    106 kg  Height:        Intake/Output Summary (Last 24 hours) at 05/17/2019 0906 Last data filed at 05/17/2019 4097 Gross per 24 hour  Intake 1160 ml  Output 2450 ml  Net -1290 ml   Last 3 Weights 05/17/2019 05/16/2019 05/15/2019  Weight (lbs) 233 lb 9.6 oz 235 lb 239 lb 8 oz  Weight (kg) 105.96 kg 106.595 kg 108.636 kg      Telemetry    SR - Personally Reviewed  ECG    N/a - Personally Reviewed  Physical Exam  Older obese WF GEN: No acute distress.   Neck: No JVD Cardiac: RRR, no murmurs, rubs, or gallops.  Respiratory: Clear to auscultation bilaterally. GI: Soft, nontender, non-distended  MS: Trace  pretibial edema; No deformity. Neuro:  Nonfocal  Psych: Normal affect   Labs    High Sensitivity Troponin:   Recent Labs  Lab 05/10/19 1419 05/10/19 1704  TROPONINIHS 141* 365*      Cardiac EnzymesNo results for input(s): TROPONINI in the last 168 hours. No results for input(s): TROPIPOC in the last 168 hours.   Chemistry Recent Labs  Lab 05/15/19 0716 05/16/19 1010 05/17/19 0433  NA 140 143 144  K 3.5 3.2* 3.7  CL 93* 93* 94*  CO2 37* 39* 41*  GLUCOSE 96 96 88  BUN 27* 28* 30*  CREATININE 1.54* 1.57* 1.67*  CALCIUM 9.1 9.2 9.2  GFRNONAA 33* 32* 30*  GFRAA 38* 37* 34*  ANIONGAP 10 11 9      Hematology Recent Labs  Lab 05/10/19 1403 05/10/19 1420  WBC 14.8*  --   RBC 4.19  --   HGB 13.4 13.9  HCT 43.6 41.0  MCV 104.1*  --   MCH 32.0  --   MCHC 30.7  --   RDW 13.1  --   PLT 262  --     BNP Recent Labs  Lab 05/10/19 1403  BNP 773.4*     DDimer No results for input(s): DDIMER in the last 168 hours.   Radiology    No results found.  Cardiac Studies   TTE: 05/14/19  IMPRESSIONS   1. Severe hypokinesis of the left ventricular, mid-apical anteroseptal wall, anterior wall and anterolateral wall. 2. The left ventricle has severely reduced systolic function, with an ejection fraction of 25-30%. The cavity size was normal. There is mildly increased left ventricular wall thickness. Left ventricular diastolic Doppler parameters are indeterminate.  Left ventricular diffuse hypokinesis. 3. Basilar contractile sparing. Consider Takotsubo. 4. The right ventricle has normal systolic function. The cavity was mildly enlarged. There is no increase in right ventricular wall thickness. Right ventricular systolic pressure is moderately elevated with an estimated pressure of 54.2 mmHg. 5. Mild thickening of the mitral valve leaflet. The MR jet is posteriorly-directed. 6. Tricuspid valve regurgitation is mild-moderate. 7. The aortic valve is tricuspid. Mild  thickening of the aortic valve. Mild calcification of the aortic valve.  Patient Profile     78 y.o. female  with persistent atrial fibrillation, admitted with acute diastolic heart failure and atrial fibrillation.  Assessment & Plan    1. Symptomatic atrial fibrillation with WUJ:[id number] successful DCCV and has maintained SR.IV dilt was stopped, but continued onmetoprololwith HR stable. Remains on Xarelto. Followed by Dr. Curt Bears who managed her flecainide. -- Dr. Burt Knack discussed with EP and felt appropriate to switch to amiodarone. Tolerating amiodarone 200mg  BID. Will continue the same.  -- continue Xarelto  2. Acute on chronic diastolic heart failurewith new systolic NF:AOZHYQMVH well, 11.2L negative since admission. Weightdown 15 lbs total. 248>>233lbs since admission. Initially on lasix but refused 2/2 to side effects of headache, and back pain. Now on Bumex 4mg >> given already this morning. Will reduce back to 2mg  tomorrow.  -- Echo with new severe LV dysfunction, EF 25-30%.Suspect this is tachy mediated.  -- on Toprol 25mg . Will hold on ARB/spiro at this time with elevated Cr.  3. Elevated high-sensitivity troponin:Peaked at 365.Likely related to demand ischemia in the setting of atrial fibrillation with RVR and acute on chronic heart failure. Plan as above.  4. Chronic kidney disease stage QIO:[id number] with mild increase from 1.4>>1.54>>1.67. Pending this morning. - follow BMET  For questions or updates, please contact [place] Please consult [email or web address] for contact info under     Signed, Reino Bellis, NP  05/17/2019, 9:06 AM    Patient seen, examined. Available data reviewed. Agree with findings, assessment, and plan as outlined by Reino Bellis, NP.  The patient is independently interviewed and examined.  On my examination, she is alert, oriented, in no distress.  JVP is normal, lung fields are clear, heart is regular rate and rhythm with no  murmur or gallop, abdomen is soft, obese, and nontender.  Extremities show continued improvement in edema with only trace residual edema.  Labs are reviewed and her creatinine has trended upward a little bit to 1.67 mg/dL today.  I would continue to hold ACE/ARB because of chronic kidney disease with risk of AKI.  Recommend discharging her on Bumex 2 mg daily starting tomorrow.  Would use metolazone 2.5 mg as needed weight grain greater than 3 pounds from baseline, should not be taken more than twice in any 1 week.  Continue K-Dur 20 mEq twice daily.  Would continue amiodarone 200 mg twice daily x2 weeks, then decrease dose to 200 mg daily.  Should have repeat labs with a metabolic panel early  next week with the transition of care visit.  Should have EP follow-up with Dr. Curt Bears and general cardiology follow-up with Dr. [name] Found or his APP.   [name], M.D. 05/17/2019 10:09 AM

## 2019-05-22 ENCOUNTER — Ambulatory Visit: Payer: Medicare Other | Admitting: Family Medicine

## 2019-05-22 ENCOUNTER — Other Ambulatory Visit: Payer: Self-pay

## 2019-05-22 ENCOUNTER — Ambulatory Visit (INDEPENDENT_AMBULATORY_CARE_PROVIDER_SITE_OTHER): Payer: Medicare Other | Admitting: Family Medicine

## 2019-05-22 ENCOUNTER — Encounter: Payer: Self-pay | Admitting: Family Medicine

## 2019-05-22 VITALS — BP 120/58 | HR 80 | Ht 65.0 in | Wt 234.0 lb

## 2019-05-22 DIAGNOSIS — I482 Chronic atrial fibrillation, unspecified: Secondary | ICD-10-CM | POA: Diagnosis not present

## 2019-05-22 DIAGNOSIS — I4891 Unspecified atrial fibrillation: Secondary | ICD-10-CM

## 2019-05-22 DIAGNOSIS — N183 Chronic kidney disease, stage 3 unspecified: Secondary | ICD-10-CM

## 2019-05-22 DIAGNOSIS — R600 Localized edema: Secondary | ICD-10-CM | POA: Diagnosis not present

## 2019-05-22 DIAGNOSIS — I5022 Chronic systolic (congestive) heart failure: Secondary | ICD-10-CM | POA: Insufficient documentation
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
## 2019-05-22 NOTE — Assessment & Plan Note (Signed)
Commend recheck renal function later this week.  She can either do it at our lab downstairs or cardiology on Thursday.

## 2019-05-22 NOTE — Assessment & Plan Note (Signed)
Unfortunately based on clinical exam today she is back in atrial fibrillation.  Offered to do an EKG just to confirm but she declined and says that she will have them do it on Thursday when she goes to cardiology.  She felt pretty sure that she was back in A. fib.  For now on amiodarone and a beta-blocker.  Her rate is controlled here in the office.

## 2019-05-22 NOTE — Assessment & Plan Note (Signed)
She has combination heart failure.  We did discuss her echo results today as she did have a couple of questions.  We discussed the importance of really monitoring her weight.  I think her home dry weight on her home scale is right around 231 pounds.  If at any point she hits to 34 or pretty close to that then I want her to go ahead and take her Zaroxolyn.  And if at any point her weight drops 3 pounds less than 231 that I want her to call me so that I can check a renal function and make sure that she is not overdrying her kidneys.  I also want her to keep a calendar and have of how often she is actually using the Zaroxolyn so we can get a sense of if she is using it frequently or just a couple times a month.

## 2019-05-22 NOTE — Patient Instructions (Signed)
Your Dry weight is 231

## 2019-05-23 ENCOUNTER — Telehealth: Payer: Self-pay | Admitting: Physician Assistant

## 2019-05-23 NOTE — Telephone Encounter (Signed)

## 2019-05-24 ENCOUNTER — Encounter: Payer: Self-pay | Admitting: Physician Assistant

## 2019-05-24 ENCOUNTER — Other Ambulatory Visit: Payer: Self-pay

## 2019-05-24 ENCOUNTER — Ambulatory Visit (INDEPENDENT_AMBULATORY_CARE_PROVIDER_SITE_OTHER): Payer: Medicare Other | Admitting: Physician Assistant

## 2019-05-24 ENCOUNTER — Telehealth: Payer: Self-pay | Admitting: Cardiovascular Disease

## 2019-05-24 VITALS — BP 122/80 | HR 78 | Ht 65.0 in | Wt 236.0 lb

## 2019-05-24 DIAGNOSIS — N183 Chronic kidney disease, stage 3 unspecified: Secondary | ICD-10-CM

## 2019-05-24 DIAGNOSIS — I4819 Other persistent atrial fibrillation: Secondary | ICD-10-CM

## 2019-05-24 DIAGNOSIS — I482 Chronic atrial fibrillation, unspecified: Secondary | ICD-10-CM | POA: Diagnosis not present

## 2019-05-24 DIAGNOSIS — E7800 Pure hypercholesterolemia, unspecified: Secondary | ICD-10-CM

## 2019-05-24 DIAGNOSIS — I5032 Chronic diastolic (congestive) heart failure: Secondary | ICD-10-CM

## 2019-05-24 MED ORDER — METOPROLOL SUCCINATE ER 25 MG PO TB24: 25.0000 mg | ORAL_TABLET | Freq: Two times a day (BID) | ORAL | 1 refills | Status: DC

## 2019-05-24 MED ORDER — AMIODARONE HCL 400 MG PO TABS: ORAL_TABLET | ORAL | 1 refills | Status: DC

## 2019-05-24 NOTE — Addendum Note (Signed)
Addended by: Therisa Doyne on: 05/24/2019 01:35 PM   Modules accepted: Orders
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
## 2019-05-24 NOTE — Telephone Encounter (Signed)
Metoprolol succinate increased to BID today during telemedicine visit. She was changed from tartrate to succinate at hospital discharge. Notified pharmacy of this
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
## 2019-05-24 NOTE — Telephone Encounter (Signed)
New message   Pt c/o medication issue:  1. Name of Medication: metoprolol succinate (TOPROL-XL) 25 MG 24 hr tablet  2. How are you currently taking this medication (dosage and times per day)? Twice daily   3. Are you having a reaction (difficulty breathing--STAT)? No   4. What is your medication issue? Per Jacqlyn Larsen need to verify the dosage and make sure this medication is correct? Please advise.

## 2019-05-24 NOTE — Addendum Note (Signed)
Addended by: Therisa Doyne on: 05/24/2019 02:04 PM   Modules accepted: Orders, SmartSet

## 2019-05-24 NOTE — Patient Instructions (Signed)
Medication Instructions:   Increase Metoprolol Succinate to twice daily.  If you need a refill on your cardiac medications before your next appointment, please call your pharmacy.   Lab Work: Your physician recommends that you return for lab work today: BMET   Follow-Up: At [facility], you and your health needs are our priority.  As part of our continuing mission to provide you with exceptional heart care, we have created designated Provider Care Teams.  These Care Teams include your primary Cardiologist (physician) and Advanced Practice Providers (APPs -  Physician Assistants and Nurse Practitioners) who all work together to provide you with the care you need, when you need it. . You have been scheduled for a follow-up appointment with Dr. Curt Bears on Tuesday, 06/12/19 at 10:00 AM.  Any Other Special Instructions Will Be Listed Below (If Applicable). Continue weighing yourself daily and records your weights.  Continue Sodium and Fluid restrictions.  Do not miss any doses of Xarelto.  If you have to take Metolazone, please take 1 extra potassium tablet.

## 2019-05-25 LAB — BASIC METABOLIC PANEL
BUN/Creatinine Ratio: 20 (ref 12–28)
BUN: 39 mg/dL — ABNORMAL HIGH (ref 8–27)
CO2: 29 mmol/L (ref 20–29)
Calcium: 10.1 mg/dL (ref 8.7–10.3)
Chloride: 98 mmol/L (ref 96–106)
Creatinine, Ser: 1.92 mg/dL — ABNORMAL HIGH (ref 0.57–1.00)
GFR calc Af Amer: 29 mL/min/{1.73_m2} — ABNORMAL LOW (ref >59)
GFR calc non Af Amer: 25 mL/min/{1.73_m2} — ABNORMAL LOW (ref >59)
Glucose: 85 mg/dL (ref 65–99)
Potassium: 4.7 mmol/L (ref 3.5–5.2)
Sodium: 145 mmol/L — ABNORMAL HIGH (ref 134–144)

## 2019-05-29 ENCOUNTER — Other Ambulatory Visit (HOSPITAL_COMMUNITY)
Admission: RE | Admit: 2019-05-29 | Discharge: 2019-05-29 | Disposition: A | Discharge status: Home / Self Care | Payer: Medicare Other | Source: Ambulatory Visit | Attending: Cardiology | Admitting: Cardiology

## 2019-05-29 DIAGNOSIS — Z1159 Encounter for screening for other viral diseases: Secondary | ICD-10-CM | POA: Insufficient documentation

## 2019-05-29 LAB — SARS CORONAVIRUS 2 (TAT 6-24 HRS): SARS Coronavirus 2: NEGATIVE

## 2019-06-01 ENCOUNTER — Encounter (HOSPITAL_COMMUNITY)
Admission: RE | Disposition: A | Discharge status: Home / Self Care | Payer: Self-pay | Source: Ambulatory Visit | Attending: Cardiology

## 2019-06-01 ENCOUNTER — Ambulatory Visit (HOSPITAL_COMMUNITY): Payer: Medicare Other | Admitting: Certified Registered Nurse Anesthetist

## 2019-06-01 ENCOUNTER — Ambulatory Visit (HOSPITAL_COMMUNITY)
Admission: RE | Admit: 2019-06-01 | Discharge: 2019-06-01 | Disposition: A | Discharge status: Home / Self Care | Payer: Medicare Other | Source: Ambulatory Visit | Attending: Cardiology | Admitting: Cardiology

## 2019-06-01 ENCOUNTER — Encounter (HOSPITAL_COMMUNITY): Payer: Self-pay | Admitting: *Deleted

## 2019-06-01 ENCOUNTER — Other Ambulatory Visit: Payer: Self-pay

## 2019-06-01 DIAGNOSIS — Z79899 Other long term (current) drug therapy: Secondary | ICD-10-CM | POA: Diagnosis not present

## 2019-06-01 DIAGNOSIS — I130 Hypertensive heart and chronic kidney disease with heart failure and stage 1 through stage 4 chronic kidney disease, or unspecified chronic kidney disease: Secondary | ICD-10-CM | POA: Diagnosis not present

## 2019-06-01 DIAGNOSIS — E785 Hyperlipidemia, unspecified: Secondary | ICD-10-CM | POA: Diagnosis not present

## 2019-06-01 DIAGNOSIS — I5033 Acute on chronic diastolic (congestive) heart failure: Secondary | ICD-10-CM | POA: Diagnosis not present

## 2019-06-01 DIAGNOSIS — Z880 Allergy status to penicillin: Secondary | ICD-10-CM | POA: Diagnosis not present

## 2019-06-01 DIAGNOSIS — Z79890 Hormone replacement therapy (postmenopausal): Secondary | ICD-10-CM | POA: Insufficient documentation

## 2019-06-01 DIAGNOSIS — Z9981 Dependence on supplemental oxygen: Secondary | ICD-10-CM | POA: Insufficient documentation

## 2019-06-01 DIAGNOSIS — I5042 Chronic combined systolic (congestive) and diastolic (congestive) heart failure: Secondary | ICD-10-CM | POA: Insufficient documentation

## 2019-06-01 DIAGNOSIS — Z6839 Body mass index (BMI) 39.0-39.9, adult: Secondary | ICD-10-CM | POA: Diagnosis not present

## 2019-06-01 DIAGNOSIS — Z7901 Long term (current) use of anticoagulants: Secondary | ICD-10-CM | POA: Insufficient documentation

## 2019-06-01 DIAGNOSIS — E039 Hypothyroidism, unspecified: Secondary | ICD-10-CM | POA: Insufficient documentation

## 2019-06-01 DIAGNOSIS — Z87891 Personal history of nicotine dependence: Secondary | ICD-10-CM | POA: Insufficient documentation

## 2019-06-01 DIAGNOSIS — Z90710 Acquired absence of both cervix and uterus: Secondary | ICD-10-CM | POA: Diagnosis not present

## 2019-06-01 DIAGNOSIS — E669 Obesity, unspecified: Secondary | ICD-10-CM | POA: Diagnosis not present

## 2019-06-01 DIAGNOSIS — N183 Chronic kidney disease, stage 3 (moderate): Secondary | ICD-10-CM | POA: Insufficient documentation

## 2019-06-01 DIAGNOSIS — Z8249 Family history of ischemic heart disease and other diseases of the circulatory system: Secondary | ICD-10-CM | POA: Diagnosis not present

## 2019-06-01 DIAGNOSIS — Z888 Allergy status to other drugs, medicaments and biological substances status: Secondary | ICD-10-CM | POA: Insufficient documentation

## 2019-06-01 DIAGNOSIS — I4819 Other persistent atrial fibrillation: Secondary | ICD-10-CM | POA: Insufficient documentation

## 2019-06-01 DIAGNOSIS — I4891 Unspecified atrial fibrillation: Secondary | ICD-10-CM | POA: Diagnosis not present

## 2019-06-01 HISTORY — PX: CARDIOVERSION: SHX1299

## 2019-06-01 LAB — POCT I-STAT, CHEM 8
BUN: 40 mg/dL — ABNORMAL HIGH (ref 8–23)
Calcium, Ion: 1.24 mmol/L (ref 1.15–1.40)
Chloride: 104 mmol/L (ref 98–111)
Creatinine, Ser: 1.6 mg/dL — ABNORMAL HIGH (ref 0.44–1.00)
Glucose, Bld: 93 mg/dL (ref 70–99)
HCT: 41 % (ref 36.0–46.0)
Hemoglobin: 13.9 g/dL (ref 12.0–15.0)
Potassium: 4.1 mmol/L (ref 3.5–5.1)
Sodium: 145 mmol/L (ref 135–145)
TCO2: 32 mmol/L (ref 22–32)

## 2019-06-01 SURGERY — CARDIOVERSION
Anesthesia: General

## 2019-06-01 MED ORDER — SODIUM CHLORIDE 0.9 % IV SOLN
INTRAVENOUS | Status: DC
  Administered 2019-06-01: 07:00:00 via INTRAVENOUS

## 2019-06-01 MED ORDER — PROPOFOL 10 MG/ML IV BOLUS
INTRAVENOUS | Status: DC | PRN
  Administered 2019-06-01: 60 mg via INTRAVENOUS

## 2019-06-01 MED ORDER — LIDOCAINE 2% (20 MG/ML) 5 ML SYRINGE
INTRAMUSCULAR | Status: DC | PRN
  Administered 2019-06-01: 60 mg via INTRAVENOUS

## 2019-06-01 NOTE — Anesthesia Procedure Notes (Signed)
Procedure Name: General with mask airway Date/Time: 06/01/2019 8:26 AM Performed by: Lowella Dell, CRNA Pre-anesthesia Checklist: Patient identified, Emergency Drugs available, Suction available, Patient being monitored and Timeout performed Patient Re-evaluated:Patient Re-evaluated prior to induction Oxygen Delivery Method: Ambu bag Preoxygenation: Pre-oxygenation with 100% oxygen Induction Type: IV induction Ventilation: Mask ventilation without difficulty Placement Confirmation: positive ETCO2 Dental Injury: Teeth and Oropharynx as per pre-operative assessment

## 2019-06-01 NOTE — Anesthesia Postprocedure Evaluation (Signed)
Anesthesia Post Note  Patient: Nicole Norton  Procedure(s) Performed: CARDIOVERSION (N/A )     Patient location during evaluation: PACU Anesthesia Type: General Level of consciousness: awake and alert Pain management: pain level controlled Vital Signs Assessment: post-procedure vital signs reviewed and stable Respiratory status: spontaneous breathing, nonlabored ventilation, respiratory function stable and patient connected to nasal cannula oxygen Cardiovascular status: blood pressure returned to baseline and stable Postop Assessment: no apparent nausea or vomiting Anesthetic complications: no    Last Vitals:  Vitals:   06/01/19 0708 06/01/19 0834  BP: (!) 148/88 (!) 103/59  Pulse:  (!) 52  Resp: 18 17  Temp: 36.5 C 36.6 C  SpO2: 96% 100%    Last Pain:  Vitals:   06/01/19 0834  TempSrc:   PainSc: 0-No pain                 Kalisi Bevill S

## 2019-06-01 NOTE — Anesthesia Preprocedure Evaluation (Signed)
Anesthesia Evaluation  Patient identified by MRN, date of birth, ID band Patient awake    Reviewed: Allergy & Precautions, NPO status , Patient's Chart, lab work & pertinent test results  Airway Mallampati: II  TM Distance: >3 FB Neck ROM: Full    Dental no notable dental hx.    Pulmonary neg pulmonary ROS, former smoker,    Pulmonary exam normal breath sounds clear to auscultation       Cardiovascular hypertension, +CHF  + dysrhythmias Atrial Fibrillation  Rhythm:Irregular Rate:Normal  1. Severe hypokinesis of the left ventricular, mid-apical anteroseptal wall, anterior wall and anterolateral wall.  2. The left ventricle has severely reduced systolic function, with an ejection fraction of 25-30%. The cavity size was normal. There is mildly increased left ventricular wall thickness. Left ventricular diastolic Doppler parameters are indeterminate.  Left ventricular diffuse hypokinesis.  3. Basilar contractile sparing. Consider Takotsubo.  4. The right ventricle has normal systolic function. The cavity was mildly enlarged. There is no increase in right ventricular wall thickness. Right ventricular systolic pressure is moderately elevated with an estimated pressure of 54.2 mmHg.  5. Mild thickening of the mitral valve leaflet. The MR jet is posteriorly-directed.  6. Tricuspid valve regurgitation is mild-moderate.  7. The aortic valve is tricuspid. Mild thickening of the aortic valve. Mild calcification of the aortic valve.   Neuro/Psych negative neurological ROS  negative psych ROS   GI/Hepatic negative GI ROS, Neg liver ROS,   Endo/Other  Hypothyroidism   Renal/GU Renal InsufficiencyRenal disease  negative genitourinary   Musculoskeletal negative musculoskeletal ROS (+)   Abdominal   Peds negative pediatric ROS (+)  Hematology   Anesthesia Other Findings   Reproductive/Obstetrics negative OB ROS                              Anesthesia Physical Anesthesia Plan  ASA: IV  Anesthesia Plan: General   Post-op Pain Management:    Induction: Intravenous  PONV Risk Score and Plan: 0  Airway Management Planned: Mask  Additional Equipment:   Intra-op Plan:   Post-operative Plan: Extubation in OR  Informed Consent: I have reviewed the patients History and Physical, chart, labs and discussed the procedure including the risks, benefits and alternatives for the proposed anesthesia with the patient or authorized representative who has indicated his/her understanding and acceptance.     Dental advisory given  Plan Discussed with: CRNA and Surgeon  Anesthesia Plan Comments:         Anesthesia Quick Evaluation

## 2019-06-01 NOTE — CV Procedure (Signed)
    Electrical Cardioversion Procedure Note Noble Bodie [id number] 1941/02/25  Procedure: Electrical Cardioversion Indications:  Atrial Fibrillation  Time Out: Verified patient identification, verified procedure,medications/allergies/relevent history reviewed, required imaging and test results available.  Performed  Procedure Details  The patient was NPO after midnight. Anesthesia was administered at the beside  by Dr. Kalman Shan with 60mg  of propofol.  Cardioversion was performed with synchronized biphasic defibrillation via AP pads with 120 joules.  1 attempt(s) were performed.  The patient converted to normal sinus rhythm. The patient tolerated the procedure well   IMPRESSION:  Successful cardioversion of atrial fibrillation    Candee Furbish 06/01/2019, 8:30 AM

## 2019-06-01 NOTE — Discharge Instructions (Signed)

## 2019-06-01 NOTE — Interval H&P Note (Signed)
History and Physical Interval Note:  06/01/2019 8:11 AM  Nicole Norton  has presented today for surgery, with the diagnosis of afib.  The various methods of treatment have been discussed with the patient and family. After consideration of risks, benefits and other options for treatment, the patient has consented to  Procedure(s): CARDIOVERSION (N/A) as a surgical intervention.  The patient's history has been reviewed, patient examined, no change in status, stable for surgery.  I have reviewed the patient's chart and labs.  Questions were answered to the patient's satisfaction.     [employer]

## 2019-06-01 NOTE — Transfer of Care (Signed)
Immediate Anesthesia Transfer of Care Note  Patient: Nicole Norton  Procedure(s) Performed: CARDIOVERSION (N/A )  Patient Location: PACU and Endoscopy Unit  Anesthesia Type:General  Level of Consciousness: awake and patient cooperative  Airway & Oxygen Therapy: Patient Spontanous Breathing and Patient connected to nasal cannula oxygen  Post-op Assessment: Report given to RN and Post -op Vital signs reviewed and stable  Post vital signs: Reviewed and stable  Last Vitals:  Vitals Value Taken Time  BP    Temp    Pulse    Resp    SpO2      Last Pain:  Vitals:   06/01/19 0708  TempSrc: Temporal  PainSc: 0-No pain         Complications: No apparent anesthesia complications

## 2019-06-03 ENCOUNTER — Encounter (HOSPITAL_COMMUNITY): Payer: Self-pay | Admitting: Cardiology

## 2019-06-11 ENCOUNTER — Telehealth: Payer: Self-pay | Admitting: Cardiology

## 2019-06-11 NOTE — Telephone Encounter (Signed)

## 2019-06-12 ENCOUNTER — Other Ambulatory Visit: Payer: Self-pay

## 2019-06-12 ENCOUNTER — Ambulatory Visit (INDEPENDENT_AMBULATORY_CARE_PROVIDER_SITE_OTHER): Payer: Medicare Other | Admitting: Cardiology

## 2019-06-12 ENCOUNTER — Encounter: Payer: Self-pay | Admitting: Cardiology

## 2019-06-12 VITALS — BP 138/86 | HR 62 | Ht 65.0 in | Wt 238.2 lb

## 2019-06-12 DIAGNOSIS — I4819 Other persistent atrial fibrillation: Secondary | ICD-10-CM | POA: Diagnosis not present

## 2019-06-12 MED ORDER — AMIODARONE HCL 200 MG PO TABS: 200.0000 mg | ORAL_TABLET | Freq: Every day | ORAL | 2 refills | Status: DC

## 2019-06-12 NOTE — Patient Instructions (Signed)
Medication Instructions:  Your physician has recommended you make the following change in your medication:  1. DECREASE Amiodarone to 200 mg ONCE daily  * If you need a refill on your cardiac medications before your next appointment, please call your pharmacy.   Labwork: None ordered  Testing/Procedures: None ordered  Follow-Up: Your physician wants you to follow-up in: 6 months with Dr. Curt Bears.  You will receive a reminder letter in the mail two months in advance. If you don't receive a letter, please call our office to schedule the follow-up appointment.  Thank you for choosing [facility]!!   Trinidad Curet, RN [phone]
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
## 2019-06-12 NOTE — Addendum Note (Signed)
Addended by: Stanton Kidney on: 06/12/2019 10:55 AM   Modules accepted: Orders

## 2019-06-13 ENCOUNTER — Telehealth: Payer: Self-pay | Admitting: Cardiology

## 2019-06-13 MED ORDER — BUMETANIDE 2 MG PO TABS: 2.0000 mg | ORAL_TABLET | Freq: Every day | ORAL | 6 refills | Status: DC

## 2019-06-13 MED ORDER — POTASSIUM CHLORIDE CRYS ER 20 MEQ PO TBCR: 20.0000 meq | EXTENDED_RELEASE_TABLET | Freq: Two times a day (BID) | ORAL | 6 refills | Status: DC

## 2019-06-13 MED ORDER — METOLAZONE 2.5 MG PO TABS: ORAL_TABLET | ORAL | 2 refills | Status: DC

## 2019-06-13 NOTE — Telephone Encounter (Signed)
Follow up   Nicole Norton from Pharmacy calling back in, states that he is missing the prescription for potassium chloride SA (K-DUR) 20 MEQ tablet. Please assist.

## 2019-06-13 NOTE — Telephone Encounter (Signed)
Dr. Curt Bears -- ok to refill these medications?

## 2019-06-13 NOTE — Addendum Note (Signed)
Addended by: Stanton Kidney on: 06/13/2019 02:53 PM   Modules accepted: Orders

## 2019-06-13 NOTE — Telephone Encounter (Signed)
K+ Rx sent in as requested

## 2019-06-13 NOTE — Telephone Encounter (Signed)
Dr. Curt Bears advised ok to refill both Rx. Refills sent in as advised.

## 2019-06-13 NOTE — Telephone Encounter (Signed)
New Message   Merrilee Seashore from the pharmacy is calling because the patient states that Dr. Curt Bears was to send a rx for the  metolazone and Bumex Please call to discuss.

## 2019-06-17 ENCOUNTER — Other Ambulatory Visit: Payer: Self-pay | Admitting: Family Medicine

## 2019-06-17 DIAGNOSIS — F418 Other specified anxiety disorders: Secondary | ICD-10-CM

## 2019-06-18 DIAGNOSIS — M47812 Spondylosis without myelopathy or radiculopathy, cervical region: Secondary | ICD-10-CM | POA: Diagnosis not present

## 2019-06-18 DIAGNOSIS — G894 Chronic pain syndrome: Secondary | ICD-10-CM | POA: Diagnosis not present

## 2019-06-18 DIAGNOSIS — M47817 Spondylosis without myelopathy or radiculopathy, lumbosacral region: Secondary | ICD-10-CM | POA: Diagnosis not present

## 2019-06-18 DIAGNOSIS — M170 Bilateral primary osteoarthritis of knee: Secondary | ICD-10-CM | POA: Diagnosis not present

## 2019-07-09 ENCOUNTER — Other Ambulatory Visit: Payer: Self-pay | Admitting: Family Medicine

## 2019-07-11 ENCOUNTER — Other Ambulatory Visit: Payer: Self-pay | Admitting: Cardiology

## 2019-07-11 NOTE — Telephone Encounter (Signed)
23f 108kg Scr 1.6 06/01/19 ccr 10mlmin Lovw/camnitz 06/12/19

## 2019-07-18 DIAGNOSIS — M170 Bilateral primary osteoarthritis of knee: Secondary | ICD-10-CM | POA: Diagnosis not present

## 2019-07-18 DIAGNOSIS — G894 Chronic pain syndrome: Secondary | ICD-10-CM | POA: Diagnosis not present

## 2019-07-18 DIAGNOSIS — M47812 Spondylosis without myelopathy or radiculopathy, cervical region: Secondary | ICD-10-CM | POA: Diagnosis not present

## 2019-07-18 DIAGNOSIS — M47817 Spondylosis without myelopathy or radiculopathy, lumbosacral region: Secondary | ICD-10-CM | POA: Diagnosis not present

## 2019-08-08 ENCOUNTER — Other Ambulatory Visit: Payer: Self-pay | Admitting: Family Medicine

## 2019-08-08 ENCOUNTER — Other Ambulatory Visit: Payer: Self-pay | Admitting: Cardiology

## 2019-08-08 DIAGNOSIS — Z1231 Encounter for screening mammogram for malignant neoplasm of breast: Secondary | ICD-10-CM

## 2019-08-08 MED ORDER — METOPROLOL SUCCINATE ER 25 MG PO TB24: 25.0000 mg | ORAL_TABLET | Freq: Two times a day (BID) | ORAL | 2 refills | Status: DC

## 2019-08-08 NOTE — Telephone Encounter (Signed)
New Message     *STAT* If patient is at the pharmacy, call can be transferred to refill team.   1. Which medications need to be refilled? (please list name of each medication and dose if known) Metoprolol succinate 25mg 1 tablet twice a day  2. Which pharmacy/location (including street and city if local pharmacy) is medication to be sent to? [place] [phone]  3. Do they need a 30 day or 90 day supply? 90 day supply

## 2019-08-08 NOTE — Telephone Encounter (Signed)
This is Dr. Berry's pt 

## 2019-08-08 NOTE — Telephone Encounter (Signed)
Rx(s) sent to pharmacy electronically.  

## 2019-08-16 DIAGNOSIS — M170 Bilateral primary osteoarthritis of knee: Secondary | ICD-10-CM | POA: Diagnosis not present

## 2019-08-16 DIAGNOSIS — M47817 Spondylosis without myelopathy or radiculopathy, lumbosacral region: Secondary | ICD-10-CM | POA: Diagnosis not present

## 2019-08-16 DIAGNOSIS — M47812 Spondylosis without myelopathy or radiculopathy, cervical region: Secondary | ICD-10-CM | POA: Diagnosis not present

## 2019-08-16 DIAGNOSIS — G894 Chronic pain syndrome: Secondary | ICD-10-CM | POA: Diagnosis not present

## 2019-09-14 DIAGNOSIS — Z7951 Long term (current) use of inhaled steroids: Secondary | ICD-10-CM | POA: Diagnosis not present

## 2019-09-14 DIAGNOSIS — Z79899 Other long term (current) drug therapy: Secondary | ICD-10-CM | POA: Diagnosis not present

## 2019-09-14 DIAGNOSIS — D7218 Eosinophilia in diseases classified elsewhere: Secondary | ICD-10-CM | POA: Diagnosis not present

## 2019-09-14 DIAGNOSIS — M301 Polyarteritis with lung involvement [Churg-Strauss]: Secondary | ICD-10-CM | POA: Diagnosis not present

## 2019-09-17 DIAGNOSIS — M47812 Spondylosis without myelopathy or radiculopathy, cervical region: Secondary | ICD-10-CM | POA: Diagnosis not present

## 2019-09-17 DIAGNOSIS — G894 Chronic pain syndrome: Secondary | ICD-10-CM | POA: Diagnosis not present

## 2019-09-17 DIAGNOSIS — M170 Bilateral primary osteoarthritis of knee: Secondary | ICD-10-CM | POA: Diagnosis not present

## 2019-09-17 DIAGNOSIS — M47817 Spondylosis without myelopathy or radiculopathy, lumbosacral region: Secondary | ICD-10-CM | POA: Diagnosis not present

## 2019-09-18 ENCOUNTER — Other Ambulatory Visit: Payer: Self-pay | Admitting: Family Medicine

## 2019-09-24 ENCOUNTER — Other Ambulatory Visit: Payer: Self-pay

## 2019-09-24 ENCOUNTER — Ambulatory Visit
Admission: RE | Admit: 2019-09-24 | Discharge: 2019-09-24 | Disposition: A | Discharge status: Home / Self Care | Payer: Medicare Other | Source: Ambulatory Visit | Attending: Family Medicine | Admitting: Family Medicine

## 2019-09-24 DIAGNOSIS — M170 Bilateral primary osteoarthritis of knee: Secondary | ICD-10-CM | POA: Diagnosis not present

## 2019-09-24 DIAGNOSIS — Z1231 Encounter for screening mammogram for malignant neoplasm of breast: Secondary | ICD-10-CM

## 2019-10-02 DIAGNOSIS — M170 Bilateral primary osteoarthritis of knee: Secondary | ICD-10-CM | POA: Diagnosis not present

## 2019-10-09 DIAGNOSIS — M170 Bilateral primary osteoarthritis of knee: Secondary | ICD-10-CM | POA: Diagnosis not present

## 2019-10-18 ENCOUNTER — Other Ambulatory Visit: Payer: Self-pay | Admitting: Family Medicine

## 2019-10-24 ENCOUNTER — Other Ambulatory Visit: Payer: Self-pay | Admitting: Family Medicine

## 2019-10-25 ENCOUNTER — Other Ambulatory Visit: Payer: Self-pay | Admitting: Family Medicine

## 2019-11-12 ENCOUNTER — Other Ambulatory Visit: Payer: Self-pay | Admitting: Family Medicine

## 2019-12-07 ENCOUNTER — Other Ambulatory Visit: Payer: Self-pay | Admitting: Cardiology

## 2019-12-07 ENCOUNTER — Other Ambulatory Visit: Payer: Self-pay | Admitting: Family Medicine

## 2019-12-07 NOTE — Telephone Encounter (Signed)
Xarelto 20mg  refill request received. Pt is 79 years old, weight-108kg, Crea-1.60 on 06/01/2019, last seen by Dr. Curt Bears on 06/12/2019, Diagnosis-Afib, CrCl-53ml/min; Dose is appropriate based on dosing criteria. Will send in refill to requested pharmacy.

## 2019-12-11 ENCOUNTER — Encounter: Payer: Self-pay | Admitting: Cardiology

## 2019-12-11 ENCOUNTER — Other Ambulatory Visit: Payer: Self-pay

## 2019-12-11 ENCOUNTER — Ambulatory Visit (INDEPENDENT_AMBULATORY_CARE_PROVIDER_SITE_OTHER): Payer: Medicare Other | Admitting: Cardiology

## 2019-12-11 VITALS — BP 164/100 | HR 51 | Ht 65.0 in | Wt 250.6 lb

## 2019-12-11 DIAGNOSIS — I480 Paroxysmal atrial fibrillation: Secondary | ICD-10-CM | POA: Diagnosis not present
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
## 2019-12-11 NOTE — Patient Instructions (Signed)
Medication Instructions:  Your physician recommends that you continue on your current medications as directed. Please refer to the Current Medication list given to you today.  *If you need a refill on your cardiac medications before your next appointment, please call your pharmacy*  Lab Work: None ordered If you have labs (blood work) drawn today and your tests are completely normal, you will receive your results only by: [name] MyChart Message (if you have MyChart) OR . A paper copy in the mail If you have any lab test that is abnormal or we need to change your treatment, we will call you to review the results.  Testing/Procedures: None ordered  Follow-Up: At [facility], you and your health needs are our priority.  As part of our continuing mission to provide you with exceptional heart care, we have created designated Provider Care Teams.  These Care Teams include your primary Cardiologist (physician) and Advanced Practice Providers (APPs -  Physician Assistants and Nurse Practitioners) who all work together to provide you with the care you need, when you need it.  Your next appointment:   1 year(s)  The format for your next appointment:   Either In Person or Virtual  Provider:   You may see Will Meredith Leeds, MD  In 6 months or one of the following Advanced Practice Providers on your designated Care Team:    Chanetta Marshall, NP  Tommye Standard, PA-C  Legrand Como "Jonni Sanger" Chalmers Cater, [place]   Other Instructions

## 2019-12-24 ENCOUNTER — Other Ambulatory Visit: Payer: Self-pay | Admitting: Cardiology

## 2020-01-11 ENCOUNTER — Telehealth: Payer: Self-pay | Admitting: Family Medicine

## 2020-01-11 DIAGNOSIS — E785 Hyperlipidemia, unspecified: Secondary | ICD-10-CM

## 2020-01-11 DIAGNOSIS — D508 Other iron deficiency anemias: Secondary | ICD-10-CM

## 2020-01-11 DIAGNOSIS — I10 Essential (primary) hypertension: Secondary | ICD-10-CM

## 2020-01-11 NOTE — Telephone Encounter (Signed)
Called patient and notified needs appointment and sent to schedule appointment. KG LPN

## 2020-01-11 NOTE — Telephone Encounter (Signed)
Patient last seen in 05/2019.  States going downstairs to get xray of her foot and would like to get all labs done as well.  What do you want to order for her? KG LPN

## 2020-01-11 NOTE — Telephone Encounter (Signed)
I do not mind ordering up-to-date labs but she really needs an appointment, since she has not been seen in over 6 months.  Ok for CMP, lipid, CBC

## 2020-01-15 ENCOUNTER — Other Ambulatory Visit: Payer: Self-pay | Admitting: Physical Medicine and Rehabilitation

## 2020-01-15 ENCOUNTER — Ambulatory Visit (INDEPENDENT_AMBULATORY_CARE_PROVIDER_SITE_OTHER): Payer: Medicare Other

## 2020-01-15 ENCOUNTER — Other Ambulatory Visit: Payer: Self-pay

## 2020-01-15 DIAGNOSIS — I10 Essential (primary) hypertension: Secondary | ICD-10-CM | POA: Diagnosis not present

## 2020-01-15 DIAGNOSIS — M79672 Pain in left foot: Secondary | ICD-10-CM | POA: Diagnosis not present

## 2020-01-15 DIAGNOSIS — S9032XA Contusion of left foot, initial encounter: Secondary | ICD-10-CM | POA: Diagnosis not present

## 2020-01-15 DIAGNOSIS — D508 Other iron deficiency anemias: Secondary | ICD-10-CM | POA: Diagnosis not present

## 2020-01-15 DIAGNOSIS — E785 Hyperlipidemia, unspecified: Secondary | ICD-10-CM | POA: Diagnosis not present

## 2020-01-16 ENCOUNTER — Other Ambulatory Visit: Payer: Self-pay | Admitting: Physical Medicine and Rehabilitation

## 2020-01-16 DIAGNOSIS — M5416 Radiculopathy, lumbar region: Secondary | ICD-10-CM

## 2020-01-17 ENCOUNTER — Other Ambulatory Visit: Payer: Self-pay

## 2020-01-17 ENCOUNTER — Encounter: Payer: Self-pay | Admitting: Family Medicine

## 2020-01-17 ENCOUNTER — Ambulatory Visit (INDEPENDENT_AMBULATORY_CARE_PROVIDER_SITE_OTHER): Payer: Medicare Other | Admitting: Family Medicine

## 2020-01-17 VITALS — BP 120/64 | HR 73 | Ht 65.0 in | Wt 251.0 lb

## 2020-01-17 DIAGNOSIS — N179 Acute kidney failure, unspecified: Secondary | ICD-10-CM

## 2020-01-17 DIAGNOSIS — N1832 Chronic kidney disease, stage 3b: Secondary | ICD-10-CM | POA: Diagnosis not present

## 2020-01-17 DIAGNOSIS — I10 Essential (primary) hypertension: Secondary | ICD-10-CM

## 2020-01-17 DIAGNOSIS — E039 Hypothyroidism, unspecified: Secondary | ICD-10-CM

## 2020-01-17 DIAGNOSIS — Z780 Asymptomatic menopausal state: Secondary | ICD-10-CM | POA: Diagnosis not present

## 2020-01-17 NOTE — Assessment & Plan Note (Signed)
Taking 284mcg 6 days a week and 159mcg 1 day a week. Recheck level. Will see if can add to recent labs.

## 2020-01-17 NOTE — Progress Notes (Signed)
TSH added on to labs. Order [id number].Nicole Norton, Nicole Norton, CMA

## 2020-01-17 NOTE — Assessment & Plan Note (Signed)
Recent BUN/Cr increase.  Increase fluid intake some but monitor for excess swelling. Repeat labs in 1 week. Will check UA for protein and blood as well.  Avoid all NSAIDS and ASA.

## 2020-01-17 NOTE — Assessment & Plan Note (Signed)
Well controlled. Continue current regimen. Follow up in  6 mo  
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
## 2020-01-17 NOTE — Assessment & Plan Note (Signed)
See note above

## 2020-01-20 ENCOUNTER — Other Ambulatory Visit: Payer: Self-pay

## 2020-01-20 ENCOUNTER — Ambulatory Visit (INDEPENDENT_AMBULATORY_CARE_PROVIDER_SITE_OTHER): Payer: Medicare Other

## 2020-01-20 DIAGNOSIS — M48061 Spinal stenosis, lumbar region without neurogenic claudication: Secondary | ICD-10-CM | POA: Diagnosis not present

## 2020-01-20 DIAGNOSIS — M5416 Radiculopathy, lumbar region: Secondary | ICD-10-CM

## 2020-01-22 LAB — LIPID PANEL
Cholesterol: 288 mg/dL — ABNORMAL HIGH (ref <200)
HDL: 69 mg/dL (ref >=50)
LDL Cholesterol (Calc): 166 mg/dL (calc) — ABNORMAL HIGH
Non-HDL Cholesterol (Calc): 219 mg/dL (calc) — ABNORMAL HIGH (ref <130)
Total CHOL/HDL Ratio: 4.2 (calc) (ref <5.0)
Triglycerides: 346 mg/dL — ABNORMAL HIGH (ref <150)

## 2020-01-22 LAB — COMPLETE METABOLIC PANEL WITH GFR
AG Ratio: 1.9 (calc) (ref 1.0–2.5)
ALT: 14 U/L (ref 6–29)
AST: 13 U/L (ref 10–35)
Albumin: 4 g/dL (ref 3.6–5.1)
Alkaline phosphatase (APISO): 67 U/L (ref 37–153)
BUN/Creatinine Ratio: 23 (calc) — ABNORMAL HIGH (ref 6–22)
BUN: 52 mg/dL — ABNORMAL HIGH (ref 7–25)
CO2: 35 mmol/L — ABNORMAL HIGH (ref 20–32)
Calcium: 8.6 mg/dL (ref 8.6–10.4)
Chloride: 98 mmol/L (ref 98–110)
Creat: 2.23 mg/dL — ABNORMAL HIGH (ref 0.60–0.93)
GFR, Est African American: 24 mL/min/{1.73_m2} — ABNORMAL LOW (ref >=60)
GFR, Est Non African American: 21 mL/min/{1.73_m2} — ABNORMAL LOW (ref >=60)
Globulin: 2.1 g/dL (calc) (ref 1.9–3.7)
Glucose, Bld: 105 mg/dL (ref 65–139)
Potassium: 3.9 mmol/L (ref 3.5–5.3)
Sodium: 146 mmol/L (ref 135–146)
Total Bilirubin: 0.4 mg/dL (ref 0.2–1.2)
Total Protein: 6.1 g/dL (ref 6.1–8.1)

## 2020-01-22 LAB — CBC
HCT: 36.7 % (ref 35.0–45.0)
Hemoglobin: 12.8 g/dL (ref 11.7–15.5)
MCH: 34.6 pg — ABNORMAL HIGH (ref 27.0–33.0)
MCHC: 34.9 g/dL (ref 32.0–36.0)
MCV: 99.2 fL (ref 80.0–100.0)
MPV: 11.9 fL (ref 7.5–12.5)
Platelets: 198 10*3/uL (ref 140–400)
RBC: 3.7 10*6/uL — ABNORMAL LOW (ref 3.80–5.10)
RDW: 13.3 % (ref 11.0–15.0)
WBC: 9.8 10*3/uL (ref 3.8–10.8)

## 2020-01-22 LAB — REFLEX TIQ

## 2020-01-22 LAB — TSH(REFL): TSH: 1.77 mIU/L (ref 0.40–4.50)

## 2020-01-30 ENCOUNTER — Other Ambulatory Visit: Payer: Medicare Other

## 2020-01-30 ENCOUNTER — Emergency Department (HOSPITAL_COMMUNITY): Payer: Medicare Other

## 2020-01-30 ENCOUNTER — Emergency Department (HOSPITAL_COMMUNITY)
Admission: EM | Admit: 2020-01-30 | Discharge: 2020-01-30 | Disposition: A | Discharge status: Home / Self Care | Payer: Medicare Other | Attending: Emergency Medicine | Admitting: Emergency Medicine

## 2020-01-30 ENCOUNTER — Telehealth: Payer: Self-pay | Admitting: Cardiology

## 2020-01-30 ENCOUNTER — Encounter (HOSPITAL_COMMUNITY): Payer: Self-pay | Admitting: Emergency Medicine

## 2020-01-30 ENCOUNTER — Other Ambulatory Visit: Payer: Self-pay

## 2020-01-30 DIAGNOSIS — E039 Hypothyroidism, unspecified: Secondary | ICD-10-CM | POA: Insufficient documentation

## 2020-01-30 DIAGNOSIS — N183 Chronic kidney disease, stage 3 unspecified: Secondary | ICD-10-CM | POA: Diagnosis not present

## 2020-01-30 DIAGNOSIS — I130 Hypertensive heart and chronic kidney disease with heart failure and stage 1 through stage 4 chronic kidney disease, or unspecified chronic kidney disease: Secondary | ICD-10-CM | POA: Diagnosis not present

## 2020-01-30 DIAGNOSIS — R0602 Shortness of breath: Secondary | ICD-10-CM | POA: Diagnosis not present

## 2020-01-30 DIAGNOSIS — R0600 Dyspnea, unspecified: Secondary | ICD-10-CM | POA: Diagnosis not present

## 2020-01-30 DIAGNOSIS — Z7901 Long term (current) use of anticoagulants: Secondary | ICD-10-CM | POA: Insufficient documentation

## 2020-01-30 DIAGNOSIS — I2510 Atherosclerotic heart disease of native coronary artery without angina pectoris: Secondary | ICD-10-CM | POA: Diagnosis not present

## 2020-01-30 DIAGNOSIS — I5022 Chronic systolic (congestive) heart failure: Secondary | ICD-10-CM | POA: Diagnosis not present

## 2020-01-30 DIAGNOSIS — M5442 Lumbago with sciatica, left side: Secondary | ICD-10-CM

## 2020-01-30 DIAGNOSIS — Z79899 Other long term (current) drug therapy: Secondary | ICD-10-CM | POA: Diagnosis not present

## 2020-01-30 DIAGNOSIS — M545 Low back pain: Secondary | ICD-10-CM | POA: Diagnosis not present

## 2020-01-30 DIAGNOSIS — Z87891 Personal history of nicotine dependence: Secondary | ICD-10-CM | POA: Diagnosis not present

## 2020-01-30 DIAGNOSIS — G8929 Other chronic pain: Secondary | ICD-10-CM

## 2020-01-30 DIAGNOSIS — Z20822 Contact with and (suspected) exposure to covid-19: Secondary | ICD-10-CM | POA: Insufficient documentation

## 2020-01-30 LAB — RESPIRATORY PANEL BY RT PCR (FLU A&B, COVID)
Influenza A by PCR: NEGATIVE
Influenza B by PCR: NEGATIVE
SARS Coronavirus 2 by RT PCR: NEGATIVE

## 2020-01-30 LAB — COMPREHENSIVE METABOLIC PANEL
ALT: 14 U/L (ref 0–44)
AST: 15 U/L (ref 15–41)
Albumin: 3.4 g/dL — ABNORMAL LOW (ref 3.5–5.0)
Alkaline Phosphatase: 56 U/L (ref 38–126)
Anion gap: 13 (ref 5–15)
BUN: 17 mg/dL (ref 8–23)
CO2: 28 mmol/L (ref 22–32)
Calcium: 8.8 mg/dL — ABNORMAL LOW (ref 8.9–10.3)
Chloride: 100 mmol/L (ref 98–111)
Creatinine, Ser: 1.32 mg/dL — ABNORMAL HIGH (ref 0.44–1.00)
GFR calc Af Amer: 45 mL/min — ABNORMAL LOW (ref >60)
GFR calc non Af Amer: 39 mL/min — ABNORMAL LOW (ref >60)
Glucose, Bld: 116 mg/dL — ABNORMAL HIGH (ref 70–99)
Potassium: 3.5 mmol/L (ref 3.5–5.1)
Sodium: 141 mmol/L (ref 135–145)
Total Bilirubin: 0.9 mg/dL (ref 0.3–1.2)
Total Protein: 6 g/dL — ABNORMAL LOW (ref 6.5–8.1)

## 2020-01-30 LAB — CBC WITH DIFFERENTIAL/PLATELET
Abs Immature Granulocytes: 0.05 10*3/uL (ref 0.00–0.07)
Basophils Absolute: 0 10*3/uL (ref 0.0–0.1)
Basophils Relative: 0 %
Eosinophils Absolute: 0 10*3/uL (ref 0.0–0.5)
Eosinophils Relative: 0 %
HCT: 38.5 % (ref 36.0–46.0)
Hemoglobin: 12.2 g/dL (ref 12.0–15.0)
Immature Granulocytes: 1 %
Lymphocytes Relative: 8 %
Lymphs Abs: 0.8 10*3/uL (ref 0.7–4.0)
MCH: 33.8 pg (ref 26.0–34.0)
MCHC: 31.7 g/dL (ref 30.0–36.0)
MCV: 106.6 fL — ABNORMAL HIGH (ref 80.0–100.0)
Monocytes Absolute: 0.5 10*3/uL (ref 0.1–1.0)
Monocytes Relative: 5 %
Neutro Abs: 8.5 10*3/uL — ABNORMAL HIGH (ref 1.7–7.7)
Neutrophils Relative %: 86 %
Platelets: 201 10*3/uL (ref 150–400)
RBC: 3.61 MIL/uL — ABNORMAL LOW (ref 3.87–5.11)
RDW: 13.8 % (ref 11.5–15.5)
WBC: 10 10*3/uL (ref 4.0–10.5)
nRBC: 0 % (ref 0.0–0.2)

## 2020-01-30 LAB — BRAIN NATRIURETIC PEPTIDE: B Natriuretic Peptide: 930.4 pg/mL — ABNORMAL HIGH (ref 0.0–100.0)

## 2020-01-30 MED ORDER — IOHEXOL 350 MG/ML SOLN
100.0000 mL | Freq: Once | INTRAVENOUS | Status: AC | PRN
  Administered 2020-01-30: 100 mL via INTRAVENOUS

## 2020-01-30 MED ORDER — DIAZEPAM 5 MG PO TABS: 5.0000 mg | ORAL_TABLET | Freq: Two times a day (BID) | ORAL | 0 refills | Status: DC

## 2020-01-30 MED ORDER — LORAZEPAM 2 MG/ML IJ SOLN
1.0000 mg | Freq: Once | INTRAMUSCULAR | Status: AC
  Administered 2020-01-30: 1 mg via INTRAVENOUS
  Filled 2020-01-30: qty 1

## 2020-01-30 NOTE — ED Triage Notes (Signed)
Pt to triage via [facility] from home.  Reports lower back pain that she had a MRI for last week.  States she had been taking oxycodone and pain medication was changed to dilaudid.  Dilaudid helped pain for the first few days but pt has noticed her oxygen level goes from 93% to 80% when she gets up to go to bathroom.  Reports SOB.  Wears 2 liters Oxygen via  at home but has increased it to 3L since SOB.

## 2020-01-30 NOTE — ED Notes (Signed)
Pt resting in bed. Pt denies new or worsening complaints. Will continue to monitor. No distress noted. Pt on continuous monitoring via blood pressure, pulse ox, and cardiac monitor.  

## 2020-01-30 NOTE — Telephone Encounter (Signed)
[facility] calling stating they faxed a form for the patient yesterday and forgot to add the patient's date of birth.

## 2020-01-30 NOTE — ED Notes (Signed)
Pts O2 initially 100% when starting. While ambulating, O2 dropped to 86%. Pt reports lower right back pain that radiates down her leg.
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
## 2020-01-30 NOTE — ED Notes (Signed)
Pt was discharged from the ED. Pt read and understood discharge paperwork. Pt had vital signs completed. Pt conscious, breathing, and A&Ox4. No distress noted. Pt speaking in complete sentences. Pt brought out of the ED via wheelchair. E-signature not available. 

## 2020-01-30 NOTE — Telephone Encounter (Signed)
I tried x 4 to reach [facility] to see if they could re-fax the form that was mentioned that was faxed yesterday as our office has not received any fax from [facility]. No one ever answered the phone and the recording just kept going around and around. I decided to try to reach out to the pt to let her know that I have attempted x 4 to reach [facility].

## 2020-01-30 NOTE — ED Provider Notes (Signed)
Pt signed out by Dr. Alvino Chapel pending lab work and improvement.  Pt started dilaudid for the first time yesterday and took 6, 2 mg pills.  She woke up in the night and had sob.  She still dropped down to 86% with ambulation on 2L, so a CT chest was ordered.  IMPRESSION: 1. No pulmonary embolus. 2. 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 Improvement in subpleural ground-glass opacities from 2018 exam. 3. Minimal pleural thickening/trace effusions. 4. Dilated main pulmonary artery suggesting pulmonary arterial hypertension. 5. Moderate hiatal hernia. 6. Aortic atherosclerosis including irregular mural thrombus involving the distal aortic arch. No acute aortic abnormality.  Pt does not feel sob now.  Her main complaint is her back pain as she's had no pain meds while here.  She is going to go back to her oxycodone 10s and I will add some valium to help with the spasms.  She knows to return if worse.  F/u with pcp.   Isla Pence, MD 01/30/20 1950

## 2020-01-31 NOTE — Telephone Encounter (Signed)
I s/w [facility] and was given procedure information for the pt.      [facility] Pre-operative Risk Assessment    Request for surgical clearance:  1. What type of surgery is being performed? LUMBAR ESI   2. When is this surgery scheduled? 02/06/20   3. What type of clearance is required (medical clearance vs. Pharmacy clearance to hold med vs. Both)? BOTH  4. Are there any medications that need to be held prior to surgery and how long? XARELTO X 3 DAYS PRIOR TO INJECTION   5. Practice name and name of physician performing surgery? [facility]:   6. What is your office phone number [phone]    7.   What is your office fax number [phone]  8.   Anesthesia type (None, local, MAC, general) ? NO SEDATION   Julaine Hua 01/31/2020, 2:22 PM  _________________________________________________________________   (provider comments below)

## 2020-01-31 NOTE — Telephone Encounter (Signed)
I tried to reach [facility] again still with no luck getting through. We need to have them re-fax the clearance request for the pt.

## 2020-01-31 NOTE — Telephone Encounter (Signed)
I was finally able to leave a message for [facility] to please re-fax clearance request to [phone] or may call me at [phone] to give me verbal clearance request information.

## 2020-01-31 NOTE — Telephone Encounter (Signed)
Patient with diagnosis of afib on Xarelto for anticoagulation.    Procedure:  LUMBAR ESI  Date of procedure: 02/06/20  CHADS2-VASc score of  5 (CHF, HTN, AGE, AGE, female)  CrCl 45 ml/min  Per office protocol, patient can hold Xarelto for 3 days prior to procedure.

## 2020-01-31 NOTE — Telephone Encounter (Signed)
   Primary Cardiologist: Quay Burow, MD  Chart reviewed as part of pre-operative protocol coverage. Patient was doing well on cardiac stand point when seen by Dr. Curt Bears on 12/11/19. Given past medical history and time since last visit, based on ACC/AHA guidelines, Ulyssa Crownover would be at acceptable risk for the planned procedure without further cardiovascular testing.   I will route this recommendation to the requesting party via Epic fax function and remove from pre-op pool.  Please call with questions.  [place], [place] 01/31/2020, 2:54 PM

## 2020-02-06 ENCOUNTER — Other Ambulatory Visit: Payer: Self-pay | Admitting: Family Medicine

## 2020-02-06 DIAGNOSIS — M5416 Radiculopathy, lumbar region: Secondary | ICD-10-CM | POA: Diagnosis not present

## 2020-02-12 DIAGNOSIS — I10 Essential (primary) hypertension: Secondary | ICD-10-CM | POA: Diagnosis not present

## 2020-02-12 DIAGNOSIS — N1832 Chronic kidney disease, stage 3b: Secondary | ICD-10-CM | POA: Diagnosis not present

## 2020-02-12 DIAGNOSIS — N179 Acute kidney failure, unspecified: Secondary | ICD-10-CM | POA: Diagnosis not present

## 2020-02-12 LAB — BASIC METABOLIC PANEL WITH GFR
BUN/Creatinine Ratio: 19 (calc) (ref 6–22)
BUN: 25 mg/dL (ref 7–25)
CO2: 37 mmol/L — ABNORMAL HIGH (ref 20–32)
Calcium: 9.1 mg/dL (ref 8.6–10.4)
Chloride: 102 mmol/L (ref 98–110)
Creat: 1.3 mg/dL — ABNORMAL HIGH (ref 0.60–0.93)
GFR, Est African American: 46 mL/min/{1.73_m2} — ABNORMAL LOW (ref >=60)
GFR, Est Non African American: 40 mL/min/{1.73_m2} — ABNORMAL LOW (ref >=60)
Glucose, Bld: 83 mg/dL (ref 65–99)
Potassium: 4 mmol/L (ref 3.5–5.3)
Sodium: 145 mmol/L (ref 135–146)

## 2020-02-13 DIAGNOSIS — N179 Acute kidney failure, unspecified: Secondary | ICD-10-CM | POA: Diagnosis not present

## 2020-02-13 DIAGNOSIS — I10 Essential (primary) hypertension: Secondary | ICD-10-CM | POA: Diagnosis not present

## 2020-02-13 DIAGNOSIS — N1832 Chronic kidney disease, stage 3b: Secondary | ICD-10-CM | POA: Diagnosis not present

## 2020-02-14 ENCOUNTER — Ambulatory Visit: Payer: Medicare Other | Admitting: Family Medicine

## 2020-02-14 LAB — URINALYSIS, ROUTINE W REFLEX MICROSCOPIC
Bilirubin Urine: NEGATIVE
Glucose, UA: NEGATIVE
Hgb urine dipstick: NEGATIVE
Ketones, ur: NEGATIVE
Leukocytes,Ua: NEGATIVE
Nitrite: NEGATIVE
Protein, ur: NEGATIVE
Specific Gravity, Urine: 1.008 (ref 1.001–1.03)
pH: 7.5 (ref 5.0–8.0)

## 2020-02-14 LAB — MICROALBUMIN / CREATININE URINE RATIO
Creatinine, Urine: 6 mg/dL — ABNORMAL LOW (ref 20–275)
Microalb, Ur: <0.2 mg/dL

## 2020-03-03 ENCOUNTER — Other Ambulatory Visit: Payer: Self-pay | Admitting: Family Medicine

## 2020-03-03 DIAGNOSIS — K219 Gastro-esophageal reflux disease without esophagitis: Secondary | ICD-10-CM

## 2020-03-03 DIAGNOSIS — Z7952 Long term (current) use of systemic steroids: Secondary | ICD-10-CM

## 2020-03-27 DIAGNOSIS — M47817 Spondylosis without myelopathy or radiculopathy, lumbosacral region: Secondary | ICD-10-CM | POA: Diagnosis not present

## 2020-03-27 DIAGNOSIS — M170 Bilateral primary osteoarthritis of knee: Secondary | ICD-10-CM | POA: Diagnosis not present

## 2020-03-27 DIAGNOSIS — M47812 Spondylosis without myelopathy or radiculopathy, cervical region: Secondary | ICD-10-CM | POA: Diagnosis not present

## 2020-03-27 DIAGNOSIS — G894 Chronic pain syndrome: Secondary | ICD-10-CM | POA: Diagnosis not present

## 2020-04-26 ENCOUNTER — Other Ambulatory Visit: Payer: Self-pay | Admitting: Cardiovascular Disease

## 2020-05-09 ENCOUNTER — Other Ambulatory Visit: Payer: Self-pay | Admitting: *Deleted

## 2020-05-09 MED ORDER — BUMETANIDE 2 MG PO TABS: 2.0000 mg | ORAL_TABLET | Freq: Every day | ORAL | 1 refills | Status: DC

## 2020-05-15 ENCOUNTER — Other Ambulatory Visit: Payer: Self-pay | Admitting: Family Medicine

## 2020-05-19 ENCOUNTER — Emergency Department (HOSPITAL_COMMUNITY): Payer: Medicare Other

## 2020-05-19 ENCOUNTER — Encounter (HOSPITAL_COMMUNITY): Payer: Self-pay

## 2020-05-19 ENCOUNTER — Inpatient Hospital Stay (HOSPITAL_COMMUNITY)
Admission: EM | Admit: 2020-05-19 | Discharge: 2020-05-26 | DRG: 871 | Disposition: A | Discharge status: Rehabilitation Facility | Payer: Medicare Other | Attending: Internal Medicine | Admitting: Internal Medicine

## 2020-05-19 DIAGNOSIS — M301 Polyarteritis with lung involvement [Churg-Strauss]: Secondary | ICD-10-CM | POA: Diagnosis present

## 2020-05-19 DIAGNOSIS — Z9981 Dependence on supplemental oxygen: Secondary | ICD-10-CM | POA: Diagnosis not present

## 2020-05-19 DIAGNOSIS — I2510 Atherosclerotic heart disease of native coronary artery without angina pectoris: Secondary | ICD-10-CM | POA: Diagnosis present

## 2020-05-19 DIAGNOSIS — Z6841 Body Mass Index (BMI) 40.0 and over, adult: Secondary | ICD-10-CM | POA: Diagnosis not present

## 2020-05-19 DIAGNOSIS — I21A1 Myocardial infarction type 2: Secondary | ICD-10-CM | POA: Diagnosis not present

## 2020-05-19 DIAGNOSIS — I700 Atherosclerosis of aorta: Secondary | ICD-10-CM | POA: Diagnosis present

## 2020-05-19 DIAGNOSIS — G629 Polyneuropathy, unspecified: Secondary | ICD-10-CM | POA: Diagnosis present

## 2020-05-19 DIAGNOSIS — E785 Hyperlipidemia, unspecified: Secondary | ICD-10-CM | POA: Diagnosis present

## 2020-05-19 DIAGNOSIS — A419 Sepsis, unspecified organism: Principal | ICD-10-CM | POA: Diagnosis present

## 2020-05-19 DIAGNOSIS — J9601 Acute respiratory failure with hypoxia: Secondary | ICD-10-CM | POA: Diagnosis present

## 2020-05-19 DIAGNOSIS — K219 Gastro-esophageal reflux disease without esophagitis: Secondary | ICD-10-CM | POA: Diagnosis present

## 2020-05-19 DIAGNOSIS — I130 Hypertensive heart and chronic kidney disease with heart failure and stage 1 through stage 4 chronic kidney disease, or unspecified chronic kidney disease: Secondary | ICD-10-CM | POA: Diagnosis present

## 2020-05-19 DIAGNOSIS — N179 Acute kidney failure, unspecified: Secondary | ICD-10-CM | POA: Diagnosis not present

## 2020-05-19 DIAGNOSIS — Z8249 Family history of ischemic heart disease and other diseases of the circulatory system: Secondary | ICD-10-CM | POA: Diagnosis not present

## 2020-05-19 DIAGNOSIS — J189 Pneumonia, unspecified organism: Secondary | ICD-10-CM | POA: Diagnosis not present

## 2020-05-19 DIAGNOSIS — R778 Other specified abnormalities of plasma proteins: Secondary | ICD-10-CM | POA: Diagnosis present

## 2020-05-19 DIAGNOSIS — E876 Hypokalemia: Secondary | ICD-10-CM | POA: Diagnosis present

## 2020-05-19 DIAGNOSIS — M5136 Other intervertebral disc degeneration, lumbar region: Secondary | ICD-10-CM | POA: Diagnosis present

## 2020-05-19 DIAGNOSIS — I214 Non-ST elevation (NSTEMI) myocardial infarction: Secondary | ICD-10-CM | POA: Diagnosis not present

## 2020-05-19 DIAGNOSIS — Z803 Family history of malignant neoplasm of breast: Secondary | ICD-10-CM | POA: Diagnosis not present

## 2020-05-19 DIAGNOSIS — Z7952 Long term (current) use of systemic steroids: Secondary | ICD-10-CM | POA: Diagnosis not present

## 2020-05-19 DIAGNOSIS — R6521 Severe sepsis with septic shock: Secondary | ICD-10-CM | POA: Diagnosis not present

## 2020-05-19 DIAGNOSIS — E039 Hypothyroidism, unspecified: Secondary | ICD-10-CM | POA: Diagnosis present

## 2020-05-19 DIAGNOSIS — I493 Ventricular premature depolarization: Secondary | ICD-10-CM | POA: Diagnosis present

## 2020-05-19 DIAGNOSIS — D509 Iron deficiency anemia, unspecified: Secondary | ICD-10-CM | POA: Diagnosis present

## 2020-05-19 DIAGNOSIS — S299XXA Unspecified injury of thorax, initial encounter: Secondary | ICD-10-CM | POA: Diagnosis not present

## 2020-05-19 DIAGNOSIS — Z888 Allergy status to other drugs, medicaments and biological substances status: Secondary | ICD-10-CM

## 2020-05-19 DIAGNOSIS — R6520 Severe sepsis without septic shock: Secondary | ICD-10-CM | POA: Diagnosis not present

## 2020-05-19 DIAGNOSIS — Z7901 Long term (current) use of anticoagulants: Secondary | ICD-10-CM | POA: Diagnosis not present

## 2020-05-19 DIAGNOSIS — I480 Paroxysmal atrial fibrillation: Secondary | ICD-10-CM | POA: Diagnosis present

## 2020-05-19 DIAGNOSIS — N1832 Chronic kidney disease, stage 3b: Secondary | ICD-10-CM | POA: Diagnosis present

## 2020-05-19 DIAGNOSIS — Z83438 Family history of other disorder of lipoprotein metabolism and other lipidemia: Secondary | ICD-10-CM

## 2020-05-19 DIAGNOSIS — E7800 Pure hypercholesterolemia, unspecified: Secondary | ICD-10-CM | POA: Diagnosis not present

## 2020-05-19 DIAGNOSIS — Z87891 Personal history of nicotine dependence: Secondary | ICD-10-CM

## 2020-05-19 DIAGNOSIS — S3992XA Unspecified injury of lower back, initial encounter: Secondary | ICD-10-CM | POA: Diagnosis not present

## 2020-05-19 DIAGNOSIS — I517 Cardiomegaly: Secondary | ICD-10-CM | POA: Diagnosis not present

## 2020-05-19 DIAGNOSIS — Z79890 Hormone replacement therapy (postmenopausal): Secondary | ICD-10-CM | POA: Diagnosis not present

## 2020-05-19 DIAGNOSIS — T508X5A Adverse effect of diagnostic agents, initial encounter: Secondary | ICD-10-CM | POA: Diagnosis present

## 2020-05-19 DIAGNOSIS — M1990 Unspecified osteoarthritis, unspecified site: Secondary | ICD-10-CM | POA: Diagnosis present

## 2020-05-19 DIAGNOSIS — J9611 Chronic respiratory failure with hypoxia: Secondary | ICD-10-CM | POA: Diagnosis present

## 2020-05-19 DIAGNOSIS — G9341 Metabolic encephalopathy: Secondary | ICD-10-CM | POA: Diagnosis not present

## 2020-05-19 DIAGNOSIS — J9621 Acute and chronic respiratory failure with hypoxia: Secondary | ICD-10-CM | POA: Diagnosis present

## 2020-05-19 DIAGNOSIS — N183 Chronic kidney disease, stage 3 unspecified: Secondary | ICD-10-CM | POA: Diagnosis present

## 2020-05-19 DIAGNOSIS — I4819 Other persistent atrial fibrillation: Secondary | ICD-10-CM | POA: Diagnosis present

## 2020-05-19 DIAGNOSIS — R5381 Other malaise: Secondary | ICD-10-CM | POA: Diagnosis not present

## 2020-05-19 DIAGNOSIS — M5030 Other cervical disc degeneration, unspecified cervical region: Secondary | ICD-10-CM | POA: Diagnosis present

## 2020-05-19 DIAGNOSIS — I5043 Acute on chronic combined systolic (congestive) and diastolic (congestive) heart failure: Secondary | ICD-10-CM | POA: Diagnosis present

## 2020-05-19 DIAGNOSIS — Z79899 Other long term (current) drug therapy: Secondary | ICD-10-CM

## 2020-05-19 DIAGNOSIS — I5022 Chronic systolic (congestive) heart failure: Secondary | ICD-10-CM | POA: Diagnosis present

## 2020-05-19 DIAGNOSIS — Z6839 Body mass index (BMI) 39.0-39.9, adult: Secondary | ICD-10-CM | POA: Diagnosis not present

## 2020-05-19 DIAGNOSIS — N189 Chronic kidney disease, unspecified: Secondary | ICD-10-CM | POA: Diagnosis present

## 2020-05-19 DIAGNOSIS — J9811 Atelectasis: Secondary | ICD-10-CM | POA: Diagnosis not present

## 2020-05-19 DIAGNOSIS — R570 Cardiogenic shock: Secondary | ICD-10-CM | POA: Diagnosis not present

## 2020-05-19 DIAGNOSIS — Z833 Family history of diabetes mellitus: Secondary | ICD-10-CM

## 2020-05-19 DIAGNOSIS — Z20822 Contact with and (suspected) exposure to covid-19: Secondary | ICD-10-CM | POA: Diagnosis not present

## 2020-05-19 DIAGNOSIS — I2721 Secondary pulmonary arterial hypertension: Secondary | ICD-10-CM | POA: Diagnosis present

## 2020-05-19 DIAGNOSIS — Z880 Allergy status to penicillin: Secondary | ICD-10-CM

## 2020-05-19 DIAGNOSIS — Z7951 Long term (current) use of inhaled steroids: Secondary | ICD-10-CM

## 2020-05-19 DIAGNOSIS — R911 Solitary pulmonary nodule: Secondary | ICD-10-CM | POA: Diagnosis present

## 2020-05-19 DIAGNOSIS — N1831 Chronic kidney disease, stage 3a: Secondary | ICD-10-CM | POA: Diagnosis not present

## 2020-05-19 DIAGNOSIS — T502X5A Adverse effect of carbonic-anhydrase inhibitors, benzothiadiazides and other diuretics, initial encounter: Secondary | ICD-10-CM | POA: Diagnosis not present

## 2020-05-19 DIAGNOSIS — D849 Immunodeficiency, unspecified: Secondary | ICD-10-CM | POA: Diagnosis present

## 2020-05-19 DIAGNOSIS — R4182 Altered mental status, unspecified: Secondary | ICD-10-CM | POA: Diagnosis not present

## 2020-05-19 DIAGNOSIS — N170 Acute kidney failure with tubular necrosis: Secondary | ICD-10-CM | POA: Diagnosis present

## 2020-05-19 DIAGNOSIS — J8281 Chronic eosinophilic pneumonia: Secondary | ICD-10-CM | POA: Diagnosis not present

## 2020-05-19 DIAGNOSIS — I10 Essential (primary) hypertension: Secondary | ICD-10-CM | POA: Diagnosis not present

## 2020-05-19 LAB — COMPREHENSIVE METABOLIC PANEL
ALT: 33 U/L (ref 0–44)
ALT: 33 U/L (ref 0–44)
AST: 52 U/L — ABNORMAL HIGH (ref 15–41)
AST: 44 U/L — ABNORMAL HIGH (ref 15–41)
Albumin: 2.9 g/dL — ABNORMAL LOW (ref 3.5–5.0)
Albumin: 3 g/dL — ABNORMAL LOW (ref 3.5–5.0)
Alkaline Phosphatase: 75 U/L (ref 38–126)
Alkaline Phosphatase: 74 U/L (ref 38–126)
Anion gap: 12 (ref 5–15)
Anion gap: 16 — ABNORMAL HIGH (ref 5–15)
BUN: 14 mg/dL (ref 8–23)
BUN: 18 mg/dL (ref 8–23)
CO2: 26 mmol/L (ref 22–32)
CO2: 25 mmol/L (ref 22–32)
Calcium: 8.5 mg/dL — ABNORMAL LOW (ref 8.9–10.3)
Calcium: 8.7 mg/dL — ABNORMAL LOW (ref 8.9–10.3)
Chloride: 105 mmol/L (ref 98–111)
Chloride: 104 mmol/L (ref 98–111)
Creatinine, Ser: 1.36 mg/dL — ABNORMAL HIGH (ref 0.44–1.00)
Creatinine, Ser: 1.59 mg/dL — ABNORMAL HIGH (ref 0.44–1.00)
GFR calc Af Amer: 44 mL/min — ABNORMAL LOW (ref >60)
GFR calc Af Amer: 36 mL/min — ABNORMAL LOW (ref >60)
GFR calc non Af Amer: 38 mL/min — ABNORMAL LOW (ref >60)
GFR calc non Af Amer: 31 mL/min — ABNORMAL LOW (ref >60)
Glucose, Bld: 101 mg/dL — ABNORMAL HIGH (ref 70–99)
Glucose, Bld: 129 mg/dL — ABNORMAL HIGH (ref 70–99)
Potassium: 3.5 mmol/L (ref 3.5–5.1)
Potassium: 2.9 mmol/L — ABNORMAL LOW (ref 3.5–5.1)
Sodium: 143 mmol/L (ref 135–145)
Sodium: 145 mmol/L (ref 135–145)
Total Bilirubin: 1.5 mg/dL — ABNORMAL HIGH (ref 0.3–1.2)
Total Bilirubin: 1.5 mg/dL — ABNORMAL HIGH (ref 0.3–1.2)
Total Protein: 5.6 g/dL — ABNORMAL LOW (ref 6.5–8.1)
Total Protein: 5.8 g/dL — ABNORMAL LOW (ref 6.5–8.1)

## 2020-05-19 LAB — URINALYSIS, ROUTINE W REFLEX MICROSCOPIC
Bacteria, UA: NONE SEEN
Bilirubin Urine: NEGATIVE
Glucose, UA: NEGATIVE mg/dL
Ketones, ur: NEGATIVE mg/dL
Leukocytes,Ua: NEGATIVE
Nitrite: NEGATIVE
Protein, ur: 100 mg/dL — AB
Specific Gravity, Urine: >1.046 — ABNORMAL HIGH (ref 1.005–1.030)
pH: 8 (ref 5.0–8.0)

## 2020-05-19 LAB — POCT I-STAT 7, (LYTES, BLD GAS, ICA,H+H)
Acid-Base Excess: 4 mmol/L — ABNORMAL HIGH (ref 0.0–2.0)
Bicarbonate: 29.4 mmol/L — ABNORMAL HIGH (ref 20.0–28.0)
Calcium, Ion: 1.22 mmol/L (ref 1.15–1.40)
HCT: 35 % — ABNORMAL LOW (ref 36.0–46.0)
Hemoglobin: 11.9 g/dL — ABNORMAL LOW (ref 12.0–15.0)
O2 Saturation: 99 %
Patient temperature: 97.4
Potassium: 3 mmol/L — ABNORMAL LOW (ref 3.5–5.1)
Sodium: 143 mmol/L (ref 135–145)
TCO2: 31 mmol/L (ref 22–32)
pCO2 arterial: 47.4 mmHg (ref 32.0–48.0)
pH, Arterial: 7.398 (ref 7.350–7.450)
pO2, Arterial: 117 mmHg — ABNORMAL HIGH (ref 83.0–108.0)

## 2020-05-19 LAB — CBC WITH DIFFERENTIAL/PLATELET
Abs Immature Granulocytes: 0.11 10*3/uL — ABNORMAL HIGH (ref 0.00–0.07)
Basophils Absolute: 0.1 10*3/uL (ref 0.0–0.1)
Basophils Relative: 1 %
Eosinophils Absolute: 0 10*3/uL (ref 0.0–0.5)
Eosinophils Relative: 0 %
HCT: 39.6 % (ref 36.0–46.0)
Hemoglobin: 12.5 g/dL (ref 12.0–15.0)
Immature Granulocytes: 1 %
Lymphocytes Relative: 8 %
Lymphs Abs: 1.2 10*3/uL (ref 0.7–4.0)
MCH: 34 pg (ref 26.0–34.0)
MCHC: 31.6 g/dL (ref 30.0–36.0)
MCV: 107.6 fL — ABNORMAL HIGH (ref 80.0–100.0)
Monocytes Absolute: 0.8 10*3/uL (ref 0.1–1.0)
Monocytes Relative: 6 %
Neutro Abs: 12.2 10*3/uL — ABNORMAL HIGH (ref 1.7–7.7)
Neutrophils Relative %: 84 %
Platelets: 151 10*3/uL (ref 150–400)
RBC: 3.68 MIL/uL — ABNORMAL LOW (ref 3.87–5.11)
RDW: 14.1 % (ref 11.5–15.5)
WBC: 14.4 10*3/uL — ABNORMAL HIGH (ref 4.0–10.5)
nRBC: 0 % (ref 0.0–0.2)

## 2020-05-19 LAB — LACTIC ACID, PLASMA: Lactic Acid, Venous: 1.9 mmol/L (ref 0.5–1.9)

## 2020-05-19 LAB — I-STAT ARTERIAL BLOOD GAS, ED
Acid-Base Excess: 6 mmol/L — ABNORMAL HIGH (ref 0.0–2.0)
Bicarbonate: 32.2 mmol/L — ABNORMAL HIGH (ref 20.0–28.0)
Calcium, Ion: 1.25 mmol/L (ref 1.15–1.40)
HCT: 36 % (ref 36.0–46.0)
Hemoglobin: 12.2 g/dL (ref 12.0–15.0)
O2 Saturation: 96 %
Potassium: 3 mmol/L — ABNORMAL LOW (ref 3.5–5.1)
Sodium: 143 mmol/L (ref 135–145)
TCO2: 34 mmol/L — ABNORMAL HIGH (ref 22–32)
pCO2 arterial: 50.7 mmHg — ABNORMAL HIGH (ref 32.0–48.0)
pH, Arterial: 7.411 (ref 7.350–7.450)
pO2, Arterial: 85 mmHg (ref 83.0–108.0)

## 2020-05-19 LAB — AMMONIA: Ammonia: 18 umol/L (ref 9–35)

## 2020-05-19 LAB — HIV ANTIBODY (ROUTINE TESTING W REFLEX): HIV Screen 4th Generation wRfx: NONREACTIVE

## 2020-05-19 LAB — TROPONIN I (HIGH SENSITIVITY)
Troponin I (High Sensitivity): 1137 ng/L (ref <18)
Troponin I (High Sensitivity): 1836 ng/L (ref <18)

## 2020-05-19 LAB — GLUCOSE, CAPILLARY
Glucose-Capillary: 126 mg/dL — ABNORMAL HIGH (ref 70–99)
Glucose-Capillary: 114 mg/dL — ABNORMAL HIGH (ref 70–99)
Glucose-Capillary: 164 mg/dL — ABNORMAL HIGH (ref 70–99)

## 2020-05-19 LAB — BRAIN NATRIURETIC PEPTIDE
B Natriuretic Peptide: 953 pg/mL — ABNORMAL HIGH (ref 0.0–100.0)
B Natriuretic Peptide: 4213.3 pg/mL — ABNORMAL HIGH (ref 0.0–100.0)

## 2020-05-19 LAB — LIPASE, BLOOD: Lipase: 22 U/L (ref 11–51)

## 2020-05-19 LAB — MAGNESIUM: Magnesium: 1.3 mg/dL — ABNORMAL LOW (ref 1.7–2.4)

## 2020-05-19 LAB — STREP PNEUMONIAE URINARY ANTIGEN: Strep Pneumo Urinary Antigen: NEGATIVE

## 2020-05-19 LAB — PROCALCITONIN: Procalcitonin: 5.78 ng/mL

## 2020-05-19 LAB — TSH: TSH: 2.329 u[IU]/mL (ref 0.350–4.500)

## 2020-05-19 LAB — SARS CORONAVIRUS 2 BY RT PCR (HOSPITAL ORDER, PERFORMED IN ~~LOC~~ HOSPITAL LAB): SARS Coronavirus 2: NEGATIVE

## 2020-05-19 MED ORDER — GUAIFENESIN ER 600 MG PO TB12
600.0000 mg | ORAL_TABLET | Freq: Two times a day (BID) | ORAL | Status: DC
  Administered 2020-05-20 – 2020-05-26 (×13): 600 mg via ORAL
  Filled 2020-05-19 (×14): qty 1

## 2020-05-19 MED ORDER — BUMETANIDE 0.25 MG/ML IJ SOLN
2.0000 mg | Freq: Once | INTRAMUSCULAR | Status: AC
  Administered 2020-05-19: 2 mg via INTRAVENOUS
  Filled 2020-05-19 (×2): qty 8

## 2020-05-19 MED ORDER — HEPARIN SODIUM (PORCINE) 5000 UNIT/ML IJ SOLN: 5000.0000 [IU] | Freq: Three times a day (TID) | INTRAMUSCULAR | Status: DC

## 2020-05-19 MED ORDER — POLYETHYLENE GLYCOL 3350 17 G PO PACK: 17.0000 g | PACK | Freq: Every day | ORAL | Status: DC | PRN

## 2020-05-19 MED ORDER — SODIUM CHLORIDE 0.9 % IV SOLN
2.0000 g | INTRAVENOUS | Status: AC
  Administered 2020-05-20 – 2020-05-24 (×5): 2 g via INTRAVENOUS
  Filled 2020-05-19 (×5): qty 20

## 2020-05-19 MED ORDER — LORAZEPAM 2 MG/ML IJ SOLN
INTRAMUSCULAR | Status: AC
  Filled 2020-05-19: qty 1

## 2020-05-19 MED ORDER — IPRATROPIUM-ALBUTEROL 0.5-2.5 (3) MG/3ML IN SOLN: 3.0000 mL | Freq: Four times a day (QID) | RESPIRATORY_TRACT | Status: DC | PRN

## 2020-05-19 MED ORDER — HEPARIN (PORCINE) 25000 UT/250ML-% IV SOLN
1300.0000 [IU]/h | INTRAVENOUS | Status: AC
  Administered 2020-05-19: 1100 [IU]/h via INTRAVENOUS
  Administered 2020-05-20: 1150 [IU]/h via INTRAVENOUS
  Administered 2020-05-22 – 2020-05-24 (×3): 1300 [IU]/h via INTRAVENOUS
  Filled 2020-05-19 (×6): qty 250

## 2020-05-19 MED ORDER — SODIUM CHLORIDE 0.9 % IV SOLN
500.0000 mg | INTRAVENOUS | Status: DC
  Administered 2020-05-19: 500 mg via INTRAVENOUS
  Filled 2020-05-19 (×3): qty 500

## 2020-05-19 MED ORDER — LEVALBUTEROL HCL 0.63 MG/3ML IN NEBU
0.6300 mg | INHALATION_SOLUTION | Freq: Four times a day (QID) | RESPIRATORY_TRACT | Status: DC
  Administered 2020-05-20: 0.63 mg via RESPIRATORY_TRACT
  Filled 2020-05-19 (×2): qty 3

## 2020-05-19 MED ORDER — LORAZEPAM 2 MG/ML IJ SOLN: 1.0000 mg | Freq: Once | INTRAMUSCULAR | Status: DC

## 2020-05-19 MED ORDER — DOCUSATE SODIUM 100 MG PO CAPS
100.0000 mg | ORAL_CAPSULE | Freq: Two times a day (BID) | ORAL | Status: DC | PRN
  Administered 2020-05-20 – 2020-05-24 (×6): 100 mg via ORAL
  Filled 2020-05-19 (×6): qty 1

## 2020-05-19 MED ORDER — DOBUTAMINE IN D5W 4-5 MG/ML-% IV SOLN
2.5000 ug/kg/min | INTRAVENOUS | Status: DC
  Administered 2020-05-19: 2.5 ug/kg/min via INTRAVENOUS

## 2020-05-19 MED ORDER — ACETAMINOPHEN 500 MG PO TABS
1000.0000 mg | ORAL_TABLET | Freq: Four times a day (QID) | ORAL | Status: DC | PRN
  Administered 2020-05-19 – 2020-05-20 (×2): 1000 mg via ORAL
  Filled 2020-05-19 (×2): qty 2

## 2020-05-19 MED ORDER — SODIUM CHLORIDE 0.9 % IV SOLN
1.0000 g | INTRAVENOUS | Status: DC
  Administered 2020-05-19: 1 g via INTRAVENOUS
  Filled 2020-05-19: qty 10

## 2020-05-19 MED ORDER — HYDROMORPHONE HCL 1 MG/ML IJ SOLN
1.0000 mg | INTRAMUSCULAR | Status: AC
  Administered 2020-05-19: 1 mg via INTRAVENOUS
  Filled 2020-05-19: qty 1

## 2020-05-19 MED ORDER — LORAZEPAM 2 MG/ML IJ SOLN
0.5000 mg | Freq: Once | INTRAMUSCULAR | Status: AC
  Administered 2020-05-19: 0.5 mg via INTRAVENOUS

## 2020-05-19 MED ORDER — DOBUTAMINE IN D5W 4-5 MG/ML-% IV SOLN
2.5000 ug/kg/min | INTRAVENOUS | Status: DC
  Filled 2020-05-19: qty 250

## 2020-05-19 MED ORDER — BUMETANIDE 0.25 MG/ML IJ SOLN
2.0000 mg | Freq: Two times a day (BID) | INTRAMUSCULAR | Status: DC
  Administered 2020-05-19: 2 mg via INTRAVENOUS
  Filled 2020-05-19 (×3): qty 8

## 2020-05-19 MED ORDER — PREDNISONE 20 MG PO TABS
40.0000 mg | ORAL_TABLET | Freq: Every day | ORAL | Status: DC
  Administered 2020-05-20 – 2020-05-24 (×5): 40 mg via ORAL
  Filled 2020-05-19 (×5): qty 2

## 2020-05-19 MED ORDER — IOHEXOL 350 MG/ML SOLN
50.0000 mL | Freq: Once | INTRAVENOUS | Status: AC | PRN
  Administered 2020-05-19: 50 mL via INTRAVENOUS

## 2020-05-19 MED ORDER — LEVOTHYROXINE SODIUM 100 MCG PO TABS
200.0000 ug | ORAL_TABLET | Freq: Every day | ORAL | Status: DC
  Administered 2020-05-20 – 2020-05-26 (×7): 200 ug via ORAL
  Filled 2020-05-19 (×7): qty 2

## 2020-05-19 MED ORDER — METHYLPREDNISOLONE SODIUM SUCC 125 MG IJ SOLR
125.0000 mg | Freq: Once | INTRAMUSCULAR | Status: AC
  Administered 2020-05-19: 125 mg via INTRAVENOUS
  Filled 2020-05-19: qty 2

## 2020-05-19 MED ORDER — ASPIRIN EC 81 MG PO TBEC
81.0000 mg | DELAYED_RELEASE_TABLET | Freq: Every day | ORAL | Status: DC
  Administered 2020-05-20 – 2020-05-25 (×6): 81 mg via ORAL
  Filled 2020-05-19 (×6): qty 1

## 2020-05-19 MED ORDER — POTASSIUM CHLORIDE 10 MEQ/100ML IV SOLN
10.0000 meq | INTRAVENOUS | Status: AC
  Administered 2020-05-19 – 2020-05-20 (×4): 10 meq via INTRAVENOUS
  Filled 2020-05-19 (×4): qty 100

## 2020-05-19 MED ORDER — ONDANSETRON HCL 4 MG/2ML IJ SOLN: 4.0000 mg | Freq: Four times a day (QID) | INTRAMUSCULAR | Status: DC | PRN

## 2020-05-19 MED ORDER — AMIODARONE HCL 200 MG PO TABS
200.0000 mg | ORAL_TABLET | Freq: Every day | ORAL | Status: DC
  Administered 2020-05-20 – 2020-05-26 (×7): 200 mg via ORAL
  Filled 2020-05-19 (×7): qty 1

## 2020-05-19 MED ORDER — PANTOPRAZOLE SODIUM 40 MG PO TBEC
40.0000 mg | DELAYED_RELEASE_TABLET | Freq: Every day | ORAL | Status: DC
  Administered 2020-05-20 – 2020-05-26 (×7): 40 mg via ORAL
  Filled 2020-05-19 (×7): qty 1

## 2020-05-19 NOTE — ED Notes (Signed)
Pt placed on bedpan, reporting need to have BM.  Female urinal provided and specimen requested.  Pt asked to be left alone repeatedly, declines to provide specimen.  Pt again refuses to be cathed.  MD made aware.
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
## 2020-05-19 NOTE — ED Notes (Signed)
Breathing more labored. Pt calling out repeatedly, "Help me," and reporting sensation of impending doom.  Pt unable to get comfortable.  MD aware.  Charge Patent examiner at bedside.

## 2020-05-19 NOTE — Progress Notes (Signed)
   PCCM Interval Note   Patient remains lethargic but arouses to voice and follows simple commands.   On dobutamine 2.5 with improvement in BP.  Remains on HFNC 30L/ 70% FiO2  Aline x 2 attempted on left wrist- unable to thread catheter over wire both times.   ABG reassuring at 7.398/ 47/ 117  Also noted K 2.9, sCr 1.59   P:  Continue to monitor KCL x 4 runs [place], MSN, AGACNP-BC [facility] 05/19/2020, 8:25 PM  See Amion for personal pager PCCM on call pager [phone]

## 2020-05-19 NOTE — ED Notes (Signed)
Pt placed on posey bed alarm 

## 2020-05-19 NOTE — Consult Note (Signed)
NAME:  Nicole Norton, MRN:  [id number], DOB:  06-Aug-1941, LOS: 0 ADMISSION DATE:  05/19/2020, CONSULTATION DATE: 7/5 REFERRING MD: Dr. Tamala Julian TRH, CHIEF COMPLAINT: Hypoxia  Brief History   79 year old female admitted with hypoxia, AMS, and treated for community-acquired pneumonia.  Before she was placed in a floor bed she became progressively hypoxemic and dyspneic prompting ICU consultation.  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  She is on 2 L oxygen at home.  She presented [facility] emergency department on 7/5 after falls at home.  Upon EMS arrival patient was noted to be profoundly hypoxemic to 74%, notably her oxygen was not on.  This improved with oxygen via nasal cannula.  Imaging in the emergency department was concerning for right-sided rounded opacification.  She was treated with IV diuretic and empiric antibiotic coverage for community-acquired pneumonia.  She was initially to be admitted to the hospital service, but her respiratory status worsened to include worsening hypoxemia and progressive tachypnea.  She also became more encephalopathic.  This prompted PCCM consultation for possible ICU admission.  Past Medical History   has a past medical history of Allergy, Anxiety, DDD (degenerative disc disease) (1991 and 2002), Hyperlipidemia, Hypertension, Hypothyroidism, Obesity, Persistent atrial fibrillation ([place]), and Thyroid disease.   Significant Hospital Events     Consults:  Cardiology 7/5 >  Procedures:    Significant Diagnostic Tests:  CTA chest 7/5 > heterogeneous airspace opacity of the dependent right lower lobe, a  new and somewhat nodular opacity measuring 2.3 x 1.5 Cm. Poor exam for evaluation of PE. Small R pleuarl effusion.  CT head 7/5 > No evidence for acute intracranial abnormality. Air-fluid levels in bilateral maxillary sinuses.   Micro Data:  Blood 7/5 >  Antimicrobials:  Azithromycin 7/5 > Ceftriaxone 7/5 >  Interim history/subjective:    Objective   Blood pressure 135/62, pulse (!) 118, temperature (!) 101.9 F (38.8 C), temperature source Oral, resp. rate (!) 36, height 5\' 6"  (1.676 m), weight 125 kg, SpO2 100 %.        Intake/Output Summary (Last 24 hours) at 05/19/2020 1717 Last data filed at 05/19/2020 1557 Gross per 24 hour  Intake 350 ml  Output 200 ml  Net 150 ml   Filed Weights   05/19/20 0651  Weight: 125 kg    Examination: General: obese elderly female in NAD HENT: [place]/AT, PERRL, no JVD apprecaible Lungs: Difficult to assess due to upper airway vocalizations. Sound clear. Tachypnea 38 RR.  Cardiovascular: RRR, no MRG Abdomen: Soft, non-tender, non-distended Extremities: No acute deformity. Trace lower extremity edema.  Neuro: Alert, oriented to self only. Non-focal.   Resolved Hospital Problem list     Assessment & Plan:  Acute on chornic hypoxemic respiratory failure: multifactorial. 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  - Supplemental oxygen to keep O2 sats 88-95%, currently requiring non-rebreather - Try her on heated high flow - May need intubation - Pulmonary hygiene   Acute on chronic HFrEF: LVEF 25-30% - Admit to ICU - Bumex 2mg  BID IV "lasix allergy" she cannot describe.  -  Echo pending - Cardiology to see  Sepsis Pneumonia Immunosuppressed (prednisone, nucala) - Empiric ABX - Cultures pending - Continuing home prednisone  Acute metabolic encephalopathy: likely due to hypoxia,  sepsis - Supportive care  Atrial fibrillation - Continue amiodarone PO - holding xarelto in favor of IV heparin  ACS rule out CAD: Troponin 1137, ? ST changes on EKG.  - Cardiology consulted - Hold xarelto in favor of IV heparin - EKG repeat in AM  Chronic steroid use at risk relative AI - low threshold to start stress steroids.  - Continue home prednisone  Best practice:  Diet: NPO Pain/Anxiety/Delirium protocol (if indicated): NA VAP protocol (if indicated): NA DVT prophylaxis: heparin infusion GI prophylaxis: Na Glucose control: SSI Mobility: BR Code Status: FULL Family Communication: unable to contact husband who has a broken phone. Neighbors number we have is on vacation until tomorrow. If something urgent friends number (see ED note). She lives 40 mins away but can drive to the husband if need be.  Disposition: ICU  Labs   CBC: Recent Labs  Lab 05/19/20 0655 05/19/20 0845  WBC 14.4*  --   NEUTROABS 12.2*  --   HGB 12.5 12.2  HCT 39.6 36.0  MCV 107.6*  --   PLT 151  --     Basic Metabolic Panel: Recent Labs  Lab 05/19/20 0655 05/19/20 0845  NA 143 143  K 3.5 3.0*  CL 105  --   CO2 26  --   GLUCOSE 101*  --   BUN 14  --   CREATININE 1.36*  --   CALCIUM 8.5*  --    GFR: Estimated Creatinine Clearance: 47.6 mL/min (A) (by C-G formula based on SCr of 1.36 mg/dL (H)). Recent Labs  Lab 05/19/20 0655 05/19/20 1415  WBC 14.4*  --   LATICACIDVEN  --  1.9    Liver Function Tests: Recent Labs  Lab 05/19/20 0655  AST 52*  ALT 33  ALKPHOS 75  BILITOT 1.5*  PROT 5.6*  ALBUMIN 2.9*   Recent Labs  Lab 05/19/20 0655  LIPASE 22   Recent Labs  Lab 05/19/20 1103  AMMONIA 18    ABG    Component Value Date/Time   PHART 7.411 05/19/2020 0845   PCO2ART 50.7 (H) 05/19/2020 0845   PO2ART 85 05/19/2020 0845   HCO3 32.2 (H) 05/19/2020 0845   TCO2 34 (H) 05/19/2020 0845   O2SAT 96.0 05/19/2020 0845     Coagulation Profile: No results for  input(s): INR, PROTIME in the last 168 hours.  Cardiac Enzymes: No results for input(s): CKTOTAL, CKMB, CKMBINDEX, TROPONINI in the last 168 hours.  HbA1C: HbA1c POC (<> result, manual entry)  Date/Time Value Ref Range Status  09/29/2018 10:51 AM 4.6 4.0 - 5.6 % Final   Hgb A1c MFr Bld  Date/Time Value Ref Range Status  09/29/2018 01:23 PM 4.9 4.8 - 5.6 % Final    Comment:    (NOTE) Pre diabetes:          5.7%-6.4% Diabetes:              >6.4% Glycemic control for   <7.0% adults with diabetes   03/06/2018 09:54 AM 5.2 <5.7 % of total Hgb Final    Comment:    For the purpose of screening for the presence of diabetes: . <5.7%       Consistent with the absence of diabetes 5.7-6.4%    Consistent with increased risk for diabetes             (  prediabetes) > or =6.5%  Consistent with diabetes . This assay result is consistent with a decreased risk of diabetes. . Currently, no consensus exists regarding use of hemoglobin A1c for diagnosis of diabetes in children. . According to American Diabetes Association (ADA) guidelines, hemoglobin A1c <7.0% represents optimal control in non-pregnant diabetic patients. Different metrics may apply to specific patient populations.  Standards of Medical Care in Diabetes(ADA). .     CBG: No results for input(s): GLUCAP in the last 168 hours.  Review of Systems:   Patient is encephalopathic and/or intubated. Therefore history has been obtained from chart review.    Past Medical History  She,  has a past medical history of Allergy, Anxiety, DDD (degenerative disc disease) (1991 and 2002), Hyperlipidemia, Hypertension, Hypothyroidism, Obesity, Persistent atrial fibrillation ([place]), and Thyroid disease.   Surgical History    Past Surgical History:  Procedure Laterality Date   CARDIOVERSION N/A 05/14/2019   Procedure: CARDIOVERSION;  Surgeon: [name], MD;  Location: [facility] ENDOSCOPY;  Service: Cardiovascular;  Laterality: N/A;    CARDIOVERSION N/A 06/01/2019   Procedure: CARDIOVERSION;  Surgeon: Jerline Pain, MD;  Location: [facility] ENDOSCOPY;  Service: Cardiovascular;  Laterality: N/A;   NM MYOCAR MULTIPLE W/SPECT  01/06/04   Cardiolite; low risk study   TOTAL ABDOMINAL HYSTERECTOMY  79 yrs old   for fibroids w/oophorectomy/ premarin X30 yrs, tapering down   TRANSTHORACIC ECHOCARDIOGRAM  01/06/04   pulmonic valve not well see; Tricuspid valve: trivial to mild regurgitation; left atrium dilation with dimension of 4.5; EF 60%     Social History   reports that she quit smoking about 20 years ago. Her smoking use included cigarettes. She has a 20.00 pack-year smoking history. She has never used smokeless tobacco. She reports current alcohol use of about 6.0 standard drinks of alcohol per week. She reports that she does not use drugs.   Family History   Her family history includes Breast cancer in her mother; Diabetes in her mother; Hyperlipidemia in her father; Hypertension in her father.   Allergies Allergies  Allergen Reactions   Allopurinol Palpitations   Uloric [Febuxostat] Other (See Comments)    Episodes of AFIB   Ace Inhibitors Other (See Comments)    Renal function   Colchicine Other (See Comments)    palpitations   Cymbalta [Duloxetine Hcl] Other (See Comments)    palpitations   Etodolac Other (See Comments)    heart flutters and headaches    Fluoxetine Other (See Comments)    Jitteriness   Omeprazole Nausea Only   Paroxetine Nausea Only    Dizzy   Solu-Medrol [Methylprednisolone Sodium Succ]     Heart palpitations/bad headache   Statins Tinitus    REACTION: myalgias REACTION: myalgias   Lasix [Furosemide] Rash    Does not work very well   Penicillins Rash    Tolerated Ceftriaxone 4/10-4/11/20 \ Did it involve swelling of the face/tongue/throat, SOB, or low BP? No Did it involve sudden or severe rash/hives, skin peeling, or any reaction on the inside of your mouth or nose?  No Did you need to seek medical attention at a hospital or doctor's office? No When did it last happen?35 years ago If all above answers are "NO", may proceed with cephalosporin use.     Home Medications  Prior to Admission medications   Medication Sig Start Date End Date Taking? Authorizing Provider  AMBULATORY NON FORMULARY MEDICATION On continuous oxygen 2 L.  Stationary pulse ox at 88%.  Drops with activity.  Diagnosis restrictive lung disease with hypoxemia. Portable gas via nasal cannula. 10/14/17   Hali Marry, MD  amiodarone (PACERONE) 200 MG tablet Take 1 tablet (200 mg total) by mouth daily. 06/12/19   Camnitz, Will Hassell Done, MD  bumetanide (BUMEX) 2 MG tablet Take 1 tablet (2 mg total) by mouth daily. 05/09/20   Hali Marry, MD  cetirizine (ZYRTEC) 10 MG tablet Take 10 mg by mouth daily.     [provider]  cholecalciferol (VITAMIN D3) 25 MCG (1000 UT) tablet Take 1,000 Units by mouth daily.    [provider]  clobetasol cream (TEMOVATE) 5.95 % Apply 1 application topically daily as needed (on affected area(s)). 09/18/19   Hali Marry, MD  diazepam (VALIUM) 5 MG tablet Take 1 tablet (5 mg total) by mouth 2 (two) times daily. Patient taking differently: Take 5 mg by mouth 2 (two) times daily as needed for anxiety.  01/30/20   Isla Pence, MD  diclofenac (FLECTOR) 1.3 % PTCH Place 1 patch onto the skin every 12 (twelve) hours. 04/20/19   [provider]  EPINEPHrine 0.3 mg/0.3 mL IJ SOAJ injection Inject 0.3 mg into the muscle once. 04/25/19   [provider]  ferrous sulfate 325 (65 FE) MG tablet Take 1 tablet (325 mg total) by mouth every Monday, Wednesday, and Friday. 03/30/19   Hali Marry, MD  FLUoxetine (PROZAC) 20 MG capsule TAKE ONE CAPSULE (20 MG) BY MOUTH DAILY. MAY INCREASE TO TWO CAPSULES AFTER TEN DAYS Patient taking differently: Take 20 mg by mouth daily.  10/24/19   Hali Marry, MD   HYDROmorphone (DILAUDID) 2 MG tablet Take 2 mg by mouth every 4 (four) hours as needed for severe pain.    [provider]  lansoprazole (PREVACID) 30 MG capsule TAKE 1 CAPSULE DAILY Patient taking differently: Take 30 mg by mouth daily.  03/03/20   Hali Marry, MD  levothyroxine (SYNTHROID) 200 MCG tablet Take 1 tablet (200 mcg total) by mouth daily before breakfast. 10/18/19   Hali Marry, MD  levothyroxine (SYNTHROID) 25 MCG tablet TAKE ONE TABLET BY MOUTH EVERY DAY BEFORE BREAKFAST Patient taking differently: Take 25 mcg by mouth daily before breakfast.  02/06/20   Hali Marry, MD  lubiprostone (AMITIZA) 24 MCG capsule Take 24 mcg by mouth 2 (two) times daily with a meal.    [provider]  Mepolizumab (NUCALA) 100 MG/ML SOAJ Inject 100 mg into the skin every 30 (thirty) days.  03/26/19   [provider]  metolazone (ZAROXOLYN) 2.5 MG tablet Take one tablet by mouth as needed for weight gain of 3lbs from baseline. Patient taking differently: Take 2.5 mg by mouth daily as needed (weight gain of 3 lbs from baseline).  12/24/19   Camnitz, Ocie Doyne, MD  metoprolol succinate (TOPROL-XL) 25 MG 24 hr tablet Take 1 tablet (25 mg total) by mouth 2 (two) times daily. 04/28/20   Lorretta Harp, MD  Multiple Vitamins-Minerals (CENTRUM SILVER PO) Take 1 tablet by mouth daily.     [provider]  Omega-3 Fatty Acids (FISH OIL) 1200 MG CAPS Take 1,200 mg by mouth daily.     [provider]  Oxycodone HCl 10 MG TABS Take 10 mg by mouth 5 (five) times daily as needed (pain).     [provider]  potassium chloride SA (K-DUR) 20 MEQ tablet Take 1 tablet (20 mEq total) by mouth 2 (two) times daily. 06/13/19   Camnitz, Will  Hassell Done, MD  predniSONE (DELTASONE) 20 MG tablet Take 40 mg by mouth daily. 12/24/19   [provider]  pregabalin (LYRICA) 75 MG capsule Take 75 mg by mouth 3 (three) times daily.    [provider]  PREMARIN 0.625 MG tablet TAKE 1 TABLET DAILY AS NEEDED Patient taking differently: Take 0.625 mg by mouth daily.  03/26/19   Hali Marry, MD  vitamin B-12 (CYANOCOBALAMIN) 500 MCG tablet Take 500 mcg by mouth every Monday, Wednesday, and Friday.     [provider]  XARELTO 20 MG TABS tablet TAKE 1 TABLET DAILY WITH SUPPER Patient taking differently: Take 20 mg by mouth daily with supper.  12/07/19   Constance Haw, MD     Critical care time: 40 mins     Georgann Housekeeper, AGACNP-BC [place] for personal pager PCCM on call pager [phone]  05/19/2020 6:28 PM
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
## 2020-05-19 NOTE — ED Triage Notes (Signed)
Pt came in [facility] from c/o post fall and now complaining of back pain. Pt also in on home o2 at 2L [place] baseline. Pt does not remember the fall, and when EMS arrived she was off her o2 and spo2 was 74%RA. Pt is now at 96%4Lo2. She is complaining ing of back pain and has had multiple falls recently.

## 2020-05-19 NOTE — ED Notes (Signed)
Still haven't been able to get urine due to multiple bowel movements this morning.
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
## 2020-05-19 NOTE — ED Notes (Signed)
Pt refused in and out cath.  Pt placed on purewick since 0900 with no result.

## 2020-05-19 NOTE — ED Notes (Signed)
Pt's spouse is having phone trouble.  Please update the following family friends:  Neighbors:  Vaughan Basta and [employer]:  Home [phone]  Cell:  [phone]    Cell:  [phone]  Denman GeorgeElmo Putt:  [phone]

## 2020-05-19 NOTE — ED Notes (Signed)
Pt hollaring out for help, stating "I am dying". This RN went into room. Pt SpO2 80% on 3 liters [place]. Placed on NRB with no relief. Pt grabbing at [facility] and trying to take it off.  RN made aware and Dr. Tamala Julian made aware.

## 2020-05-20 ENCOUNTER — Inpatient Hospital Stay (HOSPITAL_COMMUNITY): Payer: Medicare Other

## 2020-05-20 DIAGNOSIS — N1832 Chronic kidney disease, stage 3b: Secondary | ICD-10-CM

## 2020-05-20 DIAGNOSIS — I5043 Acute on chronic combined systolic (congestive) and diastolic (congestive) heart failure: Secondary | ICD-10-CM

## 2020-05-20 DIAGNOSIS — R4182 Altered mental status, unspecified: Secondary | ICD-10-CM

## 2020-05-20 LAB — BASIC METABOLIC PANEL
Anion gap: 13 (ref 5–15)
Anion gap: 14 (ref 5–15)
BUN: 21 mg/dL (ref 8–23)
BUN: 28 mg/dL — ABNORMAL HIGH (ref 8–23)
CO2: 27 mmol/L (ref 22–32)
CO2: 28 mmol/L (ref 22–32)
Calcium: 8.4 mg/dL — ABNORMAL LOW (ref 8.9–10.3)
Calcium: 8.6 mg/dL — ABNORMAL LOW (ref 8.9–10.3)
Chloride: 103 mmol/L (ref 98–111)
Chloride: 102 mmol/L (ref 98–111)
Creatinine, Ser: 1.68 mg/dL — ABNORMAL HIGH (ref 0.44–1.00)
Creatinine, Ser: 1.77 mg/dL — ABNORMAL HIGH (ref 0.44–1.00)
GFR calc Af Amer: 34 mL/min — ABNORMAL LOW (ref >60)
GFR calc Af Amer: 32 mL/min — ABNORMAL LOW (ref >60)
GFR calc non Af Amer: 29 mL/min — ABNORMAL LOW (ref >60)
GFR calc non Af Amer: 27 mL/min — ABNORMAL LOW (ref >60)
Glucose, Bld: 153 mg/dL — ABNORMAL HIGH (ref 70–99)
Glucose, Bld: 136 mg/dL — ABNORMAL HIGH (ref 70–99)
Potassium: 3.8 mmol/L (ref 3.5–5.1)
Potassium: 4 mmol/L (ref 3.5–5.1)
Sodium: 143 mmol/L (ref 135–145)
Sodium: 144 mmol/L (ref 135–145)

## 2020-05-20 LAB — ECHOCARDIOGRAM COMPLETE
Height: 66 in
Weight: 4031.77 oz

## 2020-05-20 LAB — PROTIME-INR
INR: 1.3 — ABNORMAL HIGH (ref 0.8–1.2)
Prothrombin Time: 15.6 seconds — ABNORMAL HIGH (ref 11.4–15.2)

## 2020-05-20 LAB — GLUCOSE, CAPILLARY
Glucose-Capillary: 106 mg/dL — ABNORMAL HIGH (ref 70–99)
Glucose-Capillary: 110 mg/dL — ABNORMAL HIGH (ref 70–99)
Glucose-Capillary: 123 mg/dL — ABNORMAL HIGH (ref 70–99)
Glucose-Capillary: 133 mg/dL — ABNORMAL HIGH (ref 70–99)
Glucose-Capillary: 142 mg/dL — ABNORMAL HIGH (ref 70–99)
Glucose-Capillary: 153 mg/dL — ABNORMAL HIGH (ref 70–99)

## 2020-05-20 LAB — URINE CULTURE: Culture: NO GROWTH

## 2020-05-20 LAB — PHOSPHORUS: Phosphorus: 3.5 mg/dL (ref 2.5–4.6)

## 2020-05-20 LAB — HEPARIN LEVEL (UNFRACTIONATED)
Heparin Unfractionated: 0.63 IU/mL (ref 0.30–0.70)
Heparin Unfractionated: 0.54 IU/mL (ref 0.30–0.70)

## 2020-05-20 LAB — APTT
aPTT: 76 seconds — ABNORMAL HIGH (ref 24–36)
aPTT: 68 seconds — ABNORMAL HIGH (ref 24–36)

## 2020-05-20 LAB — PROCALCITONIN: Procalcitonin: 9.06 ng/mL

## 2020-05-20 LAB — MAGNESIUM: Magnesium: 1.3 mg/dL — ABNORMAL LOW (ref 1.7–2.4)

## 2020-05-20 MED ORDER — POTASSIUM CHLORIDE CRYS ER 20 MEQ PO TBCR
40.0000 meq | EXTENDED_RELEASE_TABLET | Freq: Three times a day (TID) | ORAL | Status: DC
  Administered 2020-05-20 (×3): 40 meq via ORAL
  Filled 2020-05-20 (×4): qty 2

## 2020-05-20 MED ORDER — MAGNESIUM SULFATE 4 GM/100ML IV SOLN
4.0000 g | Freq: Once | INTRAVENOUS | Status: AC
  Administered 2020-05-20: 4 g via INTRAVENOUS
  Filled 2020-05-20: qty 100

## 2020-05-20 MED ORDER — DOBUTAMINE IN D5W 4-5 MG/ML-% IV SOLN
2.5000 ug/kg/min | INTRAVENOUS | Status: DC
  Filled 2020-05-20: qty 250

## 2020-05-20 MED ORDER — OXYCODONE HCL 5 MG PO TABS
10.0000 mg | ORAL_TABLET | Freq: Four times a day (QID) | ORAL | Status: DC | PRN
  Administered 2020-05-20 – 2020-05-26 (×18): 10 mg via ORAL
  Filled 2020-05-20 (×19): qty 2

## 2020-05-20 MED ORDER — DOXYCYCLINE HYCLATE 100 MG PO TABS
100.0000 mg | ORAL_TABLET | Freq: Two times a day (BID) | ORAL | Status: AC
  Administered 2020-05-20 – 2020-05-24 (×10): 100 mg
  Filled 2020-05-20 (×10): qty 1

## 2020-05-20 MED ORDER — LEVALBUTEROL HCL 0.63 MG/3ML IN NEBU: 0.6300 mg | INHALATION_SOLUTION | Freq: Four times a day (QID) | RESPIRATORY_TRACT | Status: DC | PRN

## 2020-05-20 MED ORDER — PREGABALIN 75 MG PO CAPS
75.0000 mg | ORAL_CAPSULE | Freq: Three times a day (TID) | ORAL | Status: DC
  Administered 2020-05-20 – 2020-05-22 (×6): 75 mg via ORAL
  Filled 2020-05-20 (×6): qty 1

## 2020-05-20 MED ORDER — DIAZEPAM 5 MG PO TABS
5.0000 mg | ORAL_TABLET | Freq: Four times a day (QID) | ORAL | Status: DC | PRN
  Administered 2020-05-20 – 2020-05-24 (×3): 5 mg via ORAL
  Filled 2020-05-20 (×3): qty 1
  Filled 2020-05-20: qty 3

## 2020-05-20 MED ORDER — CHLORHEXIDINE GLUCONATE CLOTH 2 % EX PADS
6.0000 | MEDICATED_PAD | Freq: Every day | CUTANEOUS | Status: DC
  Administered 2020-05-21 – 2020-05-23 (×3): 6 via TOPICAL

## 2020-05-20 MED ORDER — ORAL CARE MOUTH RINSE
15.0000 mL | Freq: Two times a day (BID) | OROMUCOSAL | Status: DC
  Administered 2020-05-20 – 2020-05-25 (×11): 15 mL via OROMUCOSAL

## 2020-05-20 MED ORDER — BUMETANIDE 0.25 MG/ML IJ SOLN
2.0000 mg | Freq: Three times a day (TID) | INTRAMUSCULAR | Status: DC
  Administered 2020-05-20 – 2020-05-21 (×4): 2 mg via INTRAVENOUS
  Filled 2020-05-20 (×5): qty 8
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
## 2020-05-20 NOTE — Progress Notes (Addendum)
NAME:  Joannah Gitlin, MRN:  [id number], DOB:  1941-09-01, LOS: 1 ADMISSION DATE:  05/19/2020, CONSULTATION DATE: 7/5 REFERRING MD: Dr. Tamala Julian TRH, CHIEF COMPLAINT: Hypoxia  Brief History   79 year old female admitted with hypoxia, AMS, and treated for community-acquired pneumonia.  Before she was placed in a floor bed she became progressively hypoxemic and dyspneic prompting ICU consultation.  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  She is on 2 L oxygen at home.  She presented [facility] emergency department on 7/5 after falls at home.  Upon EMS arrival patient was noted to be profoundly hypoxemic to 74%, notably her oxygen was not on.  This improved with oxygen via nasal cannula.  Imaging in the emergency department was concerning for right-sided rounded opacification.  She was treated with IV diuretic and empiric antibiotic coverage for community-acquired pneumonia.  She was initially to be admitted to the hospital service, but her respiratory status worsened to include worsening hypoxemia and progressive tachypnea.  She also became more encephalopathic.  This prompted PCCM consultation for possible ICU admission.  Past Medical History   has a past medical history of Allergy, Anxiety, DDD (degenerative disc disease) (1991 and 2002), Hyperlipidemia, Hypertension, Hypothyroidism, Obesity, Persistent atrial fibrillation ([place]), and Thyroid disease.   Significant Hospital Events     Consults:  Cardiology 7/5 >  Procedures:    Significant Diagnostic Tests:  CTA chest 7/5 > heterogeneous airspace opacity of the dependent right lower lobe, a  new and somewhat nodular opacity measuring 2.3 x 1.5 Cm. Poor exam for evaluation of PE. Small R pleuarl effusion.  CT head 7/5 > No evidence for acute intracranial abnormality. Air-fluid levels in bilateral maxillary sinuses.   Micro Data:  Blood 7/5 >  Antimicrobials:  Azithromycin 7/5 (stopped due to prolonged qtc) Doxycycline 7/6>  Ceftriaxone 7/5 >  Interim history/subjective:  Complains of feeling cold and of "wet mouth"  Small Cr bump in interval. Somewhat expected given Bumex dosing  Objective   Blood pressure (!) 116/95, pulse (!) 58, temperature 98.5 F (36.9 C), temperature source Axillary, resp. rate 20, height 5\' 6"  (1.676 m), weight 114.3 kg, SpO2 98 %.    FiO2 (%):  [40 %-100 %] 40 %   Intake/Output Summary (Last 24 hours) at 05/20/2020 0816 Last data filed at 05/20/2020 0700 Gross per 24 hour  Intake 891.21 ml  Output 1245 ml  Net -353.79 ml   Filed Weights   05/19/20 0651 05/20/20 0323  Weight: 125 kg 114.3 kg    Examination: General: Morbidly obese, older adult F, reclined in bed NAD HENT: NCAT. [place] in place. Trachea midline. Anicteric sclera Lungs: Diminished bibasilar sounds. Symmetrical chest expansion without accessory muscle  Cardiovascular: RRR s1s2 but distant heart sounds. Cap refill < 3 seconds.  Abdomen: Soft, obese, ndnt. + bowel sounds Extremities: BLE with symmetrical non-pitting edema and adiposity. No cyanosis or clubbing Neuro: Awake alert and oriented x 2. Following commands. Pleasantly confused   Resolved Hospital Problem list     Assessment & Plan:   Acute on chornic hypoxemic respiratory failure: multifactorial. 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  P -continue HHFNC, down-titrate support as able -Goal > 90% -diuresis as able-- increasing Bumex 7/6 to 2mg  TID.  Acute on  chronic HFrEF: LVEF 25-30% - "lasix allergy"-- patient does not know what her reaction is -BNP 4213 -Interval Cr bump. No true AKI yet  Shock, improved  P - Increasing Bumex to TID -- will get follow up renal function panel this afternoon.  - ECHO  - appreciate cards following - dobutamine started 7/5, now off  Sepsis Pneumonia Immunosuppressed (prednisone, nucala) PCT 9 P - Empiric ABX -- changing azithro to doxy due to prolonged qtc  - Cultures pending - Continuing home prednisone  Acute metabolic encephalopathy, improving - likely due to hypoxia, sepsis P -hypoxia, abx as above   Atrial fibrillation - Continue amiodarone PO - holding xarelto in favor of IV heparin  ACS rule out CAD: Troponin 1137, ? ST changes on EKG. Likely demand P - IV heparin per pharm   Chronic steroid use at risk relative AI - low threshold to start stress steroids.  - Continue home prednisone  Hypomagnesemia Prolonged qtc Borderline hypokalemia -being aggressively diuresed  P -Giving 4 mg Mag -changing azithro to doxy  -Replacing K TID with bumex dosing   Best practice:  Diet: Maintain NPO for now  -- pending cards re-eval may advance to [facility]  Pain/Anxiety/Delirium protocol (if indicated): NA VAP protocol (if indicated): NA DVT prophylaxis: heparin infusion GI prophylaxis: Pepcid  Glucose control: SSI Mobility: BR Code Status: FULL Family Communication: Patient updated 7/6 -- will make effort to communicate with family as well. Per prior PCCM note this has been difficult to facilitate: "unable to contact husband who has a broken phone. Neighbors number we have is on vacation until tomorrow. If something urgent friends number (see ED note). She lives 40 mins away but can drive to the husband if need be." Disposition: ICU  Labs   CBC: Recent Labs  Lab 05/19/20 0655 05/19/20 0845 05/19/20 2007  WBC 14.4*  --   --   NEUTROABS 12.2*  --   --   HGB 12.5 12.2  11.9*  HCT 39.6 36.0 35.0*  MCV 107.6*  --   --   PLT 151  --   --     Basic Metabolic Panel: Recent Labs  Lab 05/19/20 0655 05/19/20 0845 05/19/20 1725 05/19/20 2007 05/20/20 0357  NA 143 143 145 143 143  K 3.5 3.0* 2.9* 3.0* 3.8  CL 105  --  104  --  103  CO2 26  --  25  --  27  GLUCOSE 101*  --  129*  --  153*  BUN 14  --  18  --  21  CREATININE 1.36*  --  1.59*  --  1.68*  CALCIUM 8.5*  --  8.7*  --  8.4*  MG  --   --  1.3*  --  1.3*  PHOS  --   --   --   --  3.5   GFR: Estimated Creatinine Clearance: 36.6 mL/min (A) (by C-G formula based on SCr of 1.68 mg/dL (H)). Recent Labs  Lab 05/19/20 0655 05/19/20 1415 05/19/20 1725 05/20/20 0357  PROCALCITON  --   --  5.78 9.06  WBC 14.4*  --   --   --   LATICACIDVEN  --  1.9  --   --     Liver Function Tests: Recent Labs  Lab 05/19/20 0655 05/19/20 1725  AST 52* 44*  ALT 33 33  ALKPHOS 75 74  BILITOT 1.5* 1.5*  PROT 5.6* 5.8*  ALBUMIN 2.9* 3.0*   Recent Labs  Lab 05/19/20 0655  LIPASE 22   Recent Labs  Lab 05/19/20 1103  AMMONIA 18    ABG    Component Value Date/Time   PHART 7.398 05/19/2020 2007   PCO2ART 47.4 05/19/2020 2007   PO2ART 117 (H) 05/19/2020 2007   HCO3 29.4 (H) 05/19/2020 2007   TCO2 31 05/19/2020 2007   O2SAT 99.0 05/19/2020 2007     Coagulation Profile: No results for input(s): INR, PROTIME in the last 168 hours.  Cardiac Enzymes: No results for input(s): CKTOTAL, CKMB, CKMBINDEX, TROPONINI in the last 168 hours.  HbA1C: HbA1c POC (<> result, manual entry)  Date/Time Value Ref Range Status  09/29/2018 10:51 AM 4.6 4.0 - 5.6 % Final   Hgb A1c MFr Bld  Date/Time Value Ref Range Status  09/29/2018 01:23 PM 4.9 4.8 - 5.6 % Final    Comment:    (NOTE) Pre diabetes:          5.7%-6.4% Diabetes:              >6.4% Glycemic control for   <7.0% adults with diabetes   03/06/2018 09:54 AM 5.2 <5.7 % of total Hgb Final    Comment:    For the purpose of screening for the  presence of diabetes: . <5.7%       Consistent with the absence of diabetes 5.7-6.4%    Consistent with increased risk for diabetes             (prediabetes) > or =6.5%  Consistent with diabetes . This assay result is consistent with a decreased risk of diabetes. . Currently, no consensus exists regarding use of hemoglobin A1c for diagnosis of diabetes in children. . According to American Diabetes Association (ADA) guidelines, hemoglobin A1c <7.0% represents optimal control in non-pregnant diabetic patients. Different metrics may apply to specific patient populations.  Standards of Medical Care in Diabetes(ADA). .     CBG: Recent Labs  Lab 05/19/20 1435 05/19/20 1945 05/19/20 2324 05/20/20 0319 05/20/20 0746  GLUCAP 126* 114* 164* 142* 133*    CRITICAL CARE Performed by: Cristal Generous   Total critical care time: 45 minutes  Critical care time was exclusive of separately billable procedures and treating other patients. Critical care was necessary to treat or prevent imminent or life-threatening deterioration.  Critical care was time spent personally by me on the following activities: development of treatment plan with patient and/or surrogate as well as nursing, discussions with consultants, evaluation of patient's response to treatment, examination of patient, obtaining history from patient or surrogate, ordering and performing treatments and interventions, ordering and review of laboratory studies, ordering and review of radiographic studies, pulse oximetry and re-evaluation of patient's condition.  Eliseo Gum MSN, AGACNP-BC [place] [phone] If no answer, [phone] 05/20/2020, 8:17 AM

## 2020-05-20 NOTE — Progress Notes (Signed)
PT Cancellation Note  Patient Details Name: Nicole Norton MRN: [id number] DOB: 1941/09/04   Cancelled Treatment:    Reason Eval/Treat Not Completed: Patient not medically ready.  Pt still within 24 hours of starting heparin.  Will hold today. 05/20/2020  Ginger Carne., PT Acute Rehabilitation Services [phone]  (pager) [phone]  (office)   Tessie Fass [name] 05/20/2020, 5:43 PM

## 2020-05-20 NOTE — Progress Notes (Signed)
OT Cancellation Note  Patient Details Name: Nicole Norton MRN: [id number] DOB: 07/17/1941   Cancelled Treatment:    Reason Eval/Treat Not Completed: Patient not medically ready Pt recived heparin 05/19/20 at 2212. Pt not appropriate for therapy at this time as pt must have received heparin 24hours prior to initiating evaluation. Will return as time allows and pt is appropriate.   [facility] OTR/L Acute Rehabilitation Services Office: [phone]   Wyn Forster 05/20/2020, 9:39 AM
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
## 2020-05-20 NOTE — Progress Notes (Signed)
ANTICOAGULATION CONSULT NOTE  Pharmacy Consult:  Heparin Indication: chest pain/ACS, atrial fibrillation  Allergies  Allergen Reactions  . Allopurinol Palpitations  . Uloric [Febuxostat] Other (See Comments)    Episodes of AFIB  . Ace Inhibitors Other (See Comments)    Renal function  . Colchicine Other (See Comments)    palpitations  . Cymbalta [Duloxetine Hcl] Other (See Comments)    palpitations  . Etodolac Other (See Comments)    heart flutters and headaches   . Fluoxetine Other (See Comments)    Jitteriness  . Omeprazole Nausea Only  . Paroxetine Nausea Only    Dizzy  . Solu-Medrol [Methylprednisolone Sodium Succ]     Heart palpitations/bad headache  . Statins Tinitus    REACTION: myalgias REACTION: myalgias  . Lasix [Furosemide] Rash    Does not work very well  . Penicillins Rash    Tolerated Ceftriaxone 4/10-4/11/20 \ Did it involve swelling of the face/tongue/throat, SOB, or low BP? No Did it involve sudden or severe rash/hives, skin peeling, or any reaction on the inside of your mouth or nose? No Did you need to seek medical attention at a hospital or doctor's office? No When did it last happen?35 years ago If all above answers are "NO", may proceed with cephalosporin use.    Patient Measurements: Height: 5\' 6"  (167.6 cm) Weight: 114.3 kg (251 lb 15.8 oz) IBW/kg (Calculated) : 59.3 Heparin Dosing Weight: 89.4 kg  Vital Signs: Temp: 98 F (36.7 C) (07/06 1110) Temp Source: Axillary (07/06 1110) BP: 124/77 (07/06 1200) Pulse Rate: 58 (07/06 1200)  Labs: Recent Labs    05/19/20 0655 05/19/20 0655 05/19/20 0845 05/19/20 1527 05/19/20 1650 05/19/20 1725 05/19/20 2007 05/20/20 0357 05/20/20 1127  HGB 12.5   < > 12.2  --   --   --  11.9*  --   --   HCT 39.6  --  36.0  --   --   --  35.0*  --   --   PLT 151  --   --   --   --   --   --   --   --   APTT  --   --   --   --   --   --   --  76* 68*  LABPROT  --   --   --   --   --   --   --   --   15.6*  INR  --   --   --   --   --   --   --   --  1.3*  HEPARINUNFRC  --   --   --   --   --   --   --  0.63 0.54  CREATININE 1.36*  --   --   --   --  1.59*  --  1.68*  --   TROPONINIHS  --   --   --  1,137* 1,836*  --   --   --   --    < > = values in this interval not displayed.    Estimated Creatinine Clearance: 36.6 mL/min (A) (by C-G formula based on SCr of 1.68 mg/dL (H)).   Assessment: 79 year old woman with history of atrial fibrillation on Xarelto PTA, last dose unknown.  Admitted with SOB/hypoxia, AoC systolic HF and elevated troponin. Pharmacy is consulted to dose heparin.  Both heparin level and aPTT are therapeutic, will use aPTT for now to  guide heparin dosing.  No bleeding reported.  Goal of Therapy:  APTT 66-102 sec Heparin level 0.3-0.7 units/ml Monitor platelets by anticoagulation protocol: Yes   Plan:  Increase heparin gtt slightly to 1150 units/hr Daily heparin level, aPTT and CBC  Anyae Griffith D. Mina Marble, PharmD, BCPS, [place] 05/20/2020, 1:45 PM

## 2020-05-20 NOTE — Progress Notes (Signed)
Spoke to patient's husband Mr Pennel on the phone and he gave permission for [place] to give Elmo Putt (the housekeeper) information about Nicole Norton.

## 2020-05-20 NOTE — Progress Notes (Signed)
  Echocardiogram 2D Echocardiogram has been performed.  Jannett Celestine 05/20/2020, 1:09 PM

## 2020-05-21 DIAGNOSIS — E7800 Pure hypercholesterolemia, unspecified: Secondary | ICD-10-CM

## 2020-05-21 DIAGNOSIS — I214 Non-ST elevation (NSTEMI) myocardial infarction: Secondary | ICD-10-CM

## 2020-05-21 DIAGNOSIS — I700 Atherosclerosis of aorta: Secondary | ICD-10-CM

## 2020-05-21 DIAGNOSIS — I480 Paroxysmal atrial fibrillation: Secondary | ICD-10-CM

## 2020-05-21 LAB — CBC
HCT: 35.4 % — ABNORMAL LOW (ref 36.0–46.0)
Hemoglobin: 11.6 g/dL — ABNORMAL LOW (ref 12.0–15.0)
MCH: 34 pg (ref 26.0–34.0)
MCHC: 32.8 g/dL (ref 30.0–36.0)
MCV: 103.8 fL — ABNORMAL HIGH (ref 80.0–100.0)
Platelets: 138 10*3/uL — ABNORMAL LOW (ref 150–400)
RBC: 3.41 MIL/uL — ABNORMAL LOW (ref 3.87–5.11)
RDW: 14.3 % (ref 11.5–15.5)
WBC: 12 10*3/uL — ABNORMAL HIGH (ref 4.0–10.5)
nRBC: 0 % (ref 0.0–0.2)

## 2020-05-21 LAB — BASIC METABOLIC PANEL
Anion gap: 14 (ref 5–15)
BUN: 34 mg/dL — ABNORMAL HIGH (ref 8–23)
CO2: 26 mmol/L (ref 22–32)
Calcium: 8.7 mg/dL — ABNORMAL LOW (ref 8.9–10.3)
Chloride: 101 mmol/L (ref 98–111)
Creatinine, Ser: 1.99 mg/dL — ABNORMAL HIGH (ref 0.44–1.00)
GFR calc Af Amer: 28 mL/min — ABNORMAL LOW (ref >60)
GFR calc non Af Amer: 24 mL/min — ABNORMAL LOW (ref >60)
Glucose, Bld: 119 mg/dL — ABNORMAL HIGH (ref 70–99)
Potassium: 4.3 mmol/L (ref 3.5–5.1)
Sodium: 141 mmol/L (ref 135–145)

## 2020-05-21 LAB — PROCALCITONIN: Procalcitonin: 8.37 ng/mL

## 2020-05-21 LAB — GLUCOSE, CAPILLARY
Glucose-Capillary: 90 mg/dL (ref 70–99)
Glucose-Capillary: 87 mg/dL (ref 70–99)
Glucose-Capillary: 103 mg/dL — ABNORMAL HIGH (ref 70–99)
Glucose-Capillary: 123 mg/dL — ABNORMAL HIGH (ref 70–99)
Glucose-Capillary: 123 mg/dL — ABNORMAL HIGH (ref 70–99)
Glucose-Capillary: 97 mg/dL (ref 70–99)

## 2020-05-21 LAB — MAGNESIUM: Magnesium: 2 mg/dL (ref 1.7–2.4)

## 2020-05-21 LAB — HEPARIN LEVEL (UNFRACTIONATED): Heparin Unfractionated: 0.3 IU/mL (ref 0.30–0.70)

## 2020-05-21 LAB — APTT: aPTT: 49 seconds — ABNORMAL HIGH (ref 24–36)

## 2020-05-21 LAB — LEGIONELLA PNEUMOPHILA SEROGP 1 UR AG: L. pneumophila Serogp 1 Ur Ag: NEGATIVE

## 2020-05-21 LAB — MRSA PCR SCREENING: MRSA by PCR: NEGATIVE

## 2020-05-21 MED ORDER — POTASSIUM CHLORIDE CRYS ER 20 MEQ PO TBCR
40.0000 meq | EXTENDED_RELEASE_TABLET | Freq: Two times a day (BID) | ORAL | Status: DC
  Administered 2020-05-21: 40 meq via ORAL

## 2020-05-21 MED ORDER — POTASSIUM CHLORIDE CRYS ER 20 MEQ PO TBCR
40.0000 meq | EXTENDED_RELEASE_TABLET | Freq: Every day | ORAL | Status: DC
  Filled 2020-05-21: qty 2

## 2020-05-21 MED ORDER — CLOPIDOGREL BISULFATE 75 MG PO TABS
75.0000 mg | ORAL_TABLET | Freq: Every day | ORAL | Status: DC
  Administered 2020-05-21 – 2020-05-26 (×6): 75 mg via ORAL
  Filled 2020-05-21 (×6): qty 1

## 2020-05-21 MED ORDER — BUMETANIDE 0.25 MG/ML IJ SOLN
2.0000 mg | Freq: Two times a day (BID) | INTRAMUSCULAR | Status: DC
  Administered 2020-05-21: 2 mg via INTRAVENOUS
  Filled 2020-05-21 (×2): qty 8

## 2020-05-21 MED ORDER — METOPROLOL SUCCINATE ER 25 MG PO TB24
25.0000 mg | ORAL_TABLET | Freq: Every day | ORAL | Status: DC
  Administered 2020-05-21 – 2020-05-22 (×2): 25 mg via ORAL
  Filled 2020-05-21 (×3): qty 1

## 2020-05-21 NOTE — Progress Notes (Signed)
Progress Note  Patient Name: Nicole Norton Date of Encounter: 05/21/2020  [facility] Cardiologist: Quay Burow, MD   Subjective   Feeling better. Mildly dyspneic. Good UO 1.7 L (and net -1.7 L since admission). Very slight increase in creatinine. CXR still shows some vascular congestion. Echo LVEF 50-55%. Not reported, but I believe there is regional hypokinesis of the mid-basal inferior septum and inferior wall).  Inpatient Medications    Scheduled Meds:  amiodarone  200 mg Oral Daily   aspirin EC  81 mg Oral Daily   bumetanide (BUMEX) IV  2 mg Intravenous Q8H   Chlorhexidine Gluconate Cloth  6 each Topical Daily   doxycycline  100 mg Per Tube Q12H   guaiFENesin  600 mg Oral BID   levothyroxine  200 mcg Oral QAC breakfast   mouth rinse  15 mL Mouth Rinse BID   metoprolol succinate  25 mg Oral Daily   pantoprazole  40 mg Oral Daily   potassium chloride  40 mEq Oral BID   predniSONE  40 mg Oral Daily   pregabalin  75 mg Oral TID   Continuous Infusions:  cefTRIAXone (ROCEPHIN)  IV Stopped (05/20/20 1300)   heparin 1,150 Units/hr (05/21/20 0900)   PRN Meds: acetaminophen, diazepam, docusate sodium, ipratropium-albuterol, levalbuterol, ondansetron (ZOFRAN) IV, oxyCODONE, polyethylene glycol   Vital Signs    Vitals:   05/21/20 0726 05/21/20 0733 05/21/20 0800 05/21/20 0900  BP:   (!) 167/100 (!) 161/101  Pulse:  (!) 56 64 61  Resp:  18 (!) 22 19  Temp: (!) 97.5 F (36.4 C)     TempSrc: Oral     SpO2:  100% 98% 100%  Weight:      Height:        Intake/Output Summary (Last 24 hours) at 05/21/2020 1033 Last data filed at 05/21/2020 0900 Gross per 24 hour  Intake 721.74 ml  Output 2035 ml  Net -1313.26 ml   Last 3 Weights 05/21/2020 05/20/2020 05/19/2020  Weight (lbs) 248 lb 14.4 oz 251 lb 15.8 oz 275 lb 9.2 oz  Weight (kg) 112.9 kg 114.3 kg 125 kg      Telemetry    NSR - Personally Reviewed  ECG    No new tracing - Personally  Reviewed  Physical Exam  Obese, Cushingoid GEN: No acute distress.   Neck: No JVD Cardiac: RRR, no murmurs, rubs, or gallops.  Respiratory: Clear to auscultation bilaterally. GI: Soft, nontender, non-distended  MS: No edema; No deformity. Neuro:  Nonfocal  Psych: Normal affect   Labs    High Sensitivity Troponin:   Recent Labs  Lab 05/19/20 1527 05/19/20 1650  TROPONINIHS 1,137* 1,836*      Chemistry Recent Labs  Lab 05/19/20 0655 05/19/20 0845 05/19/20 1725 05/19/20 1725 05/19/20 2007 05/20/20 0357 05/20/20 1638  NA 143   < > 145   < > 143 143 144  K 3.5   < > 2.9*   < > 3.0* 3.8 4.0  CL 105   < > 104  --   --  103 102  CO2 26   < > 25  --   --  27 28  GLUCOSE 101*   < > 129*  --   --  153* 136*  BUN 14   < > 18  --   --  21 28*  CREATININE 1.36*   < > 1.59*  --   --  1.68* 1.77*  CALCIUM 8.5*   < >  8.7*  --   --  8.4* 8.6*  PROT 5.6*  --  5.8*  --   --   --   --   ALBUMIN 2.9*  --  3.0*  --   --   --   --   AST 52*  --  44*  --   --   --   --   ALT 33  --  33  --   --   --   --   ALKPHOS 75  --  74  --   --   --   --   BILITOT 1.5*  --  1.5*  --   --   --   --   GFRNONAA 38*   < > 31*  --   --  29* 27*  GFRAA 44*   < > 36*  --   --  34* 32*  ANIONGAP 12   < > 16*  --   --  13 14   < > = values in this interval not displayed.     Hematology Recent Labs  Lab 05/19/20 0655 05/19/20 0655 05/19/20 0845 05/19/20 2007 05/21/20 0331  WBC 14.4*  --   --   --  12.0*  RBC 3.68*  --   --   --  3.41*  HGB 12.5   < > 12.2 11.9* 11.6*  HCT 39.6   < > 36.0 35.0* 35.4*  MCV 107.6*  --   --   --  103.8*  MCH 34.0  --   --   --  34.0  MCHC 31.6  --   --   --  32.8  RDW 14.1  --   --   --  14.3  PLT 151  --   --   --  138*   < > = values in this interval not displayed.    BNP Recent Labs  Lab 05/19/20 0655 05/19/20 1727  BNP 953.0* 4,213.3*     DDimer No results for input(s): DDIMER in the last 168 hours.   Radiology    CT Angio Chest PE W/Cm &/Or Wo  Cm  Result Date: 05/19/2020 CLINICAL DATA:  Shortness of breath EXAM: CT ANGIOGRAPHY CHEST WITH CONTRAST TECHNIQUE: Multidetector CT imaging of the chest was performed using the standard protocol during bolus administration of intravenous contrast. Multiplanar CT image reconstructions and MIPs were obtained to evaluate the vascular anatomy. CONTRAST:  69mL OMNIPAQUE IOHEXOL 350 MG/ML SOLN COMPARISON:  01/30/2020 FINDINGS: Cardiovascular: Examination for pulmonary embolism is limited by reduced contrast dose, poor contrast bolus, motion artifact, and streak artifact from patient arm positioning, main pulmonary artery HU = 98. Within this limitation, there is no evidence of pulmonary embolism through the lobar pulmonary arteries. Evaluation of the segmental and more distal arteries is generally nondiagnostic. Cardiomegaly. Gross enlargement of the main pulmonary artery measuring up to 4.3 cm. No pericardial effusion. Aortic atherosclerosis. Mediastinum/Nodes: Unchanged prominent mediastinal lymph nodes. Moderate hiatal hernia with intrathoracic position of the gastric fundus. Thyroid gland, trachea, and esophagus demonstrate no significant findings. Lungs/Pleura: There is heterogeneous airspace opacity of the dependent right lower lobe, a new and somewhat nodular opacity measuring 2.3 x 1.5 cm (series 6, image 176). Small right pleural effusion associated atelectasis or consolidation. Upper Abdomen: Thickening of the partially imaged gallbladder wall in the included upper abdomen. Musculoskeletal: No chest wall abnormality. No acute or significant osseous findings. Review of the MIP images confirms the above findings. IMPRESSION: 1. Examination for  pulmonary embolism is very limited by multiple factors as detailed above. Within this limitation, there is no evidence of pulmonary embolism through the lobar pulmonary arteries. Evaluation of the segmental and more distal arteries is generally nondiagnostic. If there is  high clinical concern for pulmonary embolism, consider technical repeat examination. 2. There is heterogeneous airspace opacity of the dependent right lower lobe, a new and somewhat nodular opacity measuring 2.3 x 1.5 cm. This is nonspecific and likely infectious or inflammatory. Recommend follow-up CT in 3 months to ensure resolution. 3. Small right pleural effusion and associated atelectasis or consolidation. 4. Cardiomegaly. 5. Gross enlargement of the main pulmonary artery measuring up to 4.3 cm, as can be seen with pulmonary hypertension. 6. Moderate hiatal hernia with intrathoracic position of the gastric fundus, which may place the patient at risk for aspiration. 7. Thickening of the partially imaged gallbladder wall in the included upper abdomen, which is nonspecific and may be further evaluated by right upper quadrant ultrasound if indicated by clinical signs and symptoms. 8. Aortic Atherosclerosis (ICD10-I70.0). Electronically Signed   By: Eddie Candle M.D.   On: 05/19/2020 12:27   ECHOCARDIOGRAM COMPLETE  Result Date: 05/20/2020    ECHOCARDIOGRAM REPORT   Patient Name:   Nicole Norton Date of Exam: 05/20/2020 Medical Rec #:  [id number]              Height:       66.0 in Accession #:    [id number]             Weight:       252.0 lb Date of Birth:  September 14, 1941              BSA:          2.206 m Patient Age:    79 years               BP:           124/77 mmHg Patient Gender: F                      HR:           58 bpm. Exam Location:  Inpatient Procedure: 2D Echo Indications:    systolic heart failure  History:        Patient has prior history of Echocardiogram examinations, most                 recent 05/14/2019. Risk Factors:Hypertension, Dyslipidemia and                 Former Smoker. Persistent a-fib. cardioversion hx.  Sonographer:    Jannett Celestine RDCS (AE) Referring Phys: [id number] GRACE E BOWSER  Sonographer Comments: Image acquisition challenging due to patient body habitus and Image  acquisition challenging due to respiratory motion. restricted mobility IMPRESSIONS  1. Technically difficult; LV function appears low normal; grade 2 diastolic dysfunction; mild LVE; mild MR and TR.  2. Left ventricular ejection fraction, by estimation, is 50 to 55%. The left ventricle has low normal function. The left ventricle has no regional wall motion abnormalities. The left ventricular internal cavity size was mildly dilated. Left ventricular diastolic parameters are consistent with Grade II diastolic dysfunction (pseudonormalization).  3. Right ventricular systolic function is normal. The right ventricular size is normal. There is mildly elevated pulmonary artery systolic pressure.  4. The mitral valve is normal in structure. Mild mitral valve regurgitation. No evidence of mitral stenosis.  5. The aortic  valve is tricuspid. Aortic valve regurgitation is not visualized. Mild aortic valve sclerosis is present, with no evidence of aortic valve stenosis.  6. The inferior vena cava is normal in size with greater than 50% respiratory variability, suggesting right atrial pressure of 3 mmHg. FINDINGS  Left Ventricle: Left ventricular ejection fraction, by estimation, is 50 to 55%. The left ventricle has low normal function. The left ventricle has no regional wall motion abnormalities. The left ventricular internal cavity size was mildly dilated. There is no left ventricular hypertrophy. Left ventricular diastolic parameters are consistent with Grade II diastolic dysfunction (pseudonormalization). Right Ventricle: The right ventricular size is normal.Right ventricular systolic function is normal. There is mildly elevated pulmonary artery systolic pressure. The tricuspid regurgitant velocity is 3.00 m/s, and with an assumed right atrial pressure of  8 mmHg, the estimated right ventricular systolic pressure is 97.3 mmHg. Left Atrium: Left atrial size was normal in size. Right Atrium: Right atrial size was normal in  size. Pericardium: There is no evidence of pericardial effusion. Mitral Valve: The mitral valve is normal in structure. Normal mobility of the mitral valve leaflets. Mild mitral annular calcification. Mild mitral valve regurgitation. No evidence of mitral valve stenosis. Tricuspid Valve: The tricuspid valve is normal in structure. Tricuspid valve regurgitation is mild . No evidence of tricuspid stenosis. Aortic Valve: The aortic valve is tricuspid. Aortic valve regurgitation is not visualized. Mild aortic valve sclerosis is present, with no evidence of aortic valve stenosis. Pulmonic Valve: The pulmonic valve was normal in structure. Pulmonic valve regurgitation is not visualized. No evidence of pulmonic stenosis. Aorta: The aortic root is normal in size and structure. Venous: The inferior vena cava is normal in size with greater than 50% respiratory variability, suggesting right atrial pressure of 3 mmHg. IAS/Shunts: No atrial level shunt detected by color flow Doppler. Additional Comments: Technically difficult; LV function appears low normal; grade 2 diastolic dysfunction; mild LVE; mild MR and TR.  LEFT VENTRICLE PLAX 2D LVIDd:         5.50 cm  Diastology LVIDs:         4.00 cm  LV e' lateral:   8.16 cm/s LV PW:         1.20 cm  LV E/e' lateral: 9.9 LV IVS:        1.00 cm  LV e' medial:    5.44 cm/s LVOT diam:     2.00 cm  LV E/e' medial:  14.9 LV SV:         67 LV SV Index:   30 LVOT Area:     3.14 cm  RIGHT VENTRICLE RV S prime:     11.90 cm/s LEFT ATRIUM           Index LA diam:      4.90 cm 2.22 cm/m LA Vol (A4C): 65.6 ml 29.74 ml/m  AORTIC VALVE LVOT Vmax:   95.80 cm/s LVOT Vmean:  65.100 cm/s LVOT VTI:    0.212 m  AORTA Ao Root diam: 2.70 cm MITRAL VALVE               TRICUSPID VALVE MV Area (PHT): 2.91 cm    TR Peak grad:   36.0 mmHg MV Decel Time: 261 msec    TR Vmax:        300.00 cm/s MV E velocity: 81.00 cm/s MV A velocity: 69.80 cm/s  SHUNTS MV E/A ratio:  1.16        Systemic VTI:  0.21 m  Systemic Diam: 2.00 cm Kirk Ruths MD Electronically signed by Kirk Ruths MD Signature Date/Time: 05/20/2020/2:12:37 PM    Final     Cardiac Studies   On my review, there is basal-mid inferoseptal hypokinesis on 7/6 echo.  Patient Profile     79 y.o. 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 NSTEMI: suspect demand ischemia, but underlying coronary status is uncertain.  Denies angina. Definitely has coronary risk factors (age, HTN, hypercholesterolemia). Normal perfusion study in 2005, normal ETT 2017. Currently on IV heparin. On beta blockers with mild bradycardia when in sinus rhythm. Not a good candidate for coronary angio or CT angio at this time. Plan outpatient nuclear perfusion study as an outpatient. In the meantime, start clopidogrel (lower risk of bleeding with Xarelto than ASA).  Currently on IV heparin until renal function allows restarting DOAC. 2. 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 ECG without acute ischemia or old Q waves. CXR still suggest some congestion, but may be lagging behind clinical status. Diuretic dose reduced due to renal dysfunction. 3. PAfib: previously documented. Had persistent AFib requiring DCCV x 2 one year ago. CHADSVasc 5-6 (age 10, gender, HTN, HF, +/- CAD). On anticoagulation prior to admission (Xarelto).  Mild RVR earlier, now back in NSR/mild bradycardia. 4. Amiodarone: lung issues precede treatment w amiodarone. 5. Long QT: amiodarone + HypoMg. Avoid  other QT prolonging drugs (azithro has been switched to doxycycline) and Today's labs pending. Recheck ECG after MG corrected. 5. Ac on chronic respiratory failure:  Multifactorial. Pneumonia and HF on top of chronic interstitial lung disease. Improving. 6. PAH: likely due to chronic lung disease. Note marked dilation of main PA (4.3 cm) suggesting chronicity, whereas left heart failure is a relatively recent problem 7. Aortic atherosclerosis: noted on chest CT. Increases odds of CAD, but there is only minimal coronary calcification (prox RCA and mid LCX on my review of non-coronary chest CT). Needs lipid lowering to target LDL<70. 8. HLP: LDL 166 in March. Statin intolerant due to myalgia. Will need to start [place] or Praluent. Refer to lipid clinic.    CRITICAL CARE Performed by: Dani Gobble Caylen Kuwahara   Total critical care time: 40 minutes  Critical care time was exclusive of separately billable procedures and treating other patients.  Critical care was necessary to treat or prevent imminent or life-threatening deterioration.  Critical care was time spent personally by me on the following activities: development of treatment plan with patient and/or surrogate as well as nursing, discussions with consultants, evaluation of patient's response to treatment, examination of patient, obtaining history from patient or surrogate, ordering and performing treatments and interventions, ordering and review of laboratory studies, ordering and review of radiographic studies, pulse oximetry and re-evaluation of patient's condition.   For questions or updates, please contact [place] Please consult [email or web address] for contact info under        Signed, Sanda Klein, MD  05/21/2020, 10:33 AM

## 2020-05-21 NOTE — Progress Notes (Signed)
Inpatient Rehab Admissions Coordinator Note:   Per PT recommendations, pt was screened for CIR candidacy by Shann Medal, PT, DPT.  At this time we are recommending a CIR consult.  If pt would like to be considered for CIR, please place a consult order.  Please contact me with questions.   Shann Medal, PT, DPT [phone] 05/21/20 5:20 PM

## 2020-05-21 NOTE — Progress Notes (Signed)
NAME:  Nicole Norton, MRN:  [id number], DOB:  May 22, 1941, LOS: 2 ADMISSION DATE:  05/19/2020, CONSULTATION DATE: 7/5 REFERRING MD: Dr. Tamala Julian TRH, CHIEF COMPLAINT: Hypoxia  Brief History   79 year old female admitted with hypoxia, AMS, and treated for community-acquired pneumonia.  Before she was placed in a floor bed she became progressively hypoxemic and dyspneic prompting ICU consultation.  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  She is on 2 L oxygen at home.  She presented [facility] emergency department on 7/5 after falls at home.  Upon EMS arrival patient was noted to be profoundly hypoxemic to 74%, notably her oxygen was not on.  This improved with oxygen via nasal cannula.  Imaging in the emergency department was concerning for right-sided rounded opacification.  She was treated with IV diuretic and empiric antibiotic coverage for community-acquired pneumonia.  She was initially to be admitted to the hospital service, but her respiratory status worsened to include worsening hypoxemia and progressive tachypnea.  She also became more encephalopathic.  This prompted PCCM consultation for possible ICU admission.  Past Medical History   has a past medical history of Allergy, Anxiety, DDD (degenerative disc disease) (1991 and 2002), Hyperlipidemia, Hypertension, Hypothyroidism, Obesity, Persistent atrial fibrillation ([place]), and Thyroid disease.   Significant Hospital Events   7/5  Consults:  Cardiology 7/5 > admitted to [facility], transferred to [facility] and ICU. Started on dobutamine 7/6 off dobutamine  7/7 weaning oxygen   Procedures:     Significant Diagnostic Tests:  CTA chest 7/5 > heterogeneous airspace opacity of the dependent right lower lobe, a new and somewhat nodular opacity measuring 2.3 x 1.5 Cm. Poor exam for evaluation of PE. Small R pleuarl effusion.  CT head 7/5 > No evidence for acute intracranial abnormality. Air-fluid levels in bilateral maxillary sinuses.   7/6 ECHO: LVEF 40-98% Grade II diastolic dysfunction. Mild MR TR. RVSP 45mmHg  Micro Data:  Blood 7/5 > Sputum > SARS Cov2> neg  Antimicrobials:  Azithromycin 7/5 (stopped due to prolonged qtc) Doxycycline 7/6>  Ceftriaxone 7/5 >   Interim history/subjective:  HDS  Weaning O2 support   States she is feeling much better today  Objective   Blood pressure (!) 167/100, pulse 64, temperature (!) 97.5 F (36.4 C), temperature source Oral, resp. rate (!) 22, height 5\' 6"  (1.676 m), weight 112.9 kg, SpO2 98 %.    FiO2 (%):  [30 %-40 %] 30 %   Intake/Output Summary (Last 24 hours) at 05/21/2020 0832 Last data filed at 05/21/2020 0813 Gross per 24 hour  Intake 473.65 ml  Output 2135 ml  Net -1661.35 ml   Filed Weights   05/19/20 0651 05/20/20 0323 05/21/20 0500  Weight: 125 kg 114.3 kg 112.9 kg    Examination: General: Morbidly obese elderly F, reclined in bed NAD  HENT: NCAT. Salter in place. Anicteric sclera. Trachea midline  Lungs: Symmetrical chest expansion, diminished bibasilar sounds without adventitious sounds. Even, unlabored respirations  Cardiovascular: RRR s1s2. Brisk capillary refill BUE BLE  Abdomen: Soft, obese ndnt, + bowel sounds Extremities: BLE symmetrical adiposity and non-pitting edema. No clubbing or cyanosis Neuro: AAO x3 following commands. No focal deficits Skin: pale, clean dry, warm  Resolved Hospital Problem list     Assessment & Plan:   Acute on chornic hypoxemic respiratory failure: multifactorial. 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  P -Wean O2 as able -PRN CXR -IS, mobility  -Goal > 90%   -BID Bumex   Acute on chronic HFrEF:  -LVEF 50-55% (prior on 2020 25%) grade II diastolic dysfunction, mild MR and TR  - "lasix allergy"-- patient does not know what her reaction is -BNP 4213 Shock, improved  P - Continue diuresis, will decrease to BID  - K goal 4 Mag goal 2   AKI -likely multifactorial with pre-renal component with component of cardiogenic shock (resolved), possible diuresis related  P -decreasing Bumex to BID from TID  -continues on heparin instead of home xarelto   Sepsis Pneumonia Immunosuppressed (prednisone, nucala) PCT 9 P - continue to follow culture data - Cont abx (azithro changed to doxy due to prolonged qtc)  Acute metabolic encephalopathy, improving - likely due to hypoxia, sepsis P -hypoxia, abx as above   Atrial fibrillation HTN - Continue amiodarone PO - holding xarelto in favor of IV heparin - restarting home metop, at lower dose (home metop XL 25mg  BID, starting at qD 7/7)   CAD Demand ischemia P - IV heparin per pharm  - appreciate cards following  Chronic steroid use at risk relative AI - low threshold to start stress steroids.  - Continue home prednisone  Hypomagnesemia Prolonged qtc -being aggressively diuresed  P -Mag pending. Goal > 2  Chronic pain -on oxy, bzd, gabapentin (home meds)  Best practice:  Diet: Heart healthy carb mod  Pain/Anxiety/Delirium protocol (if indicated):  Oxy, bzd, gaba (home meds)  VAP protocol (if indicated): NA DVT prophylaxis: heparin gtt  GI prophylaxis: Pepcid  Glucose control: SSI Mobility: PT  Code Status: FULL Family Communication: Patient updated 7/7. Housekeeper updated 7/6. Per prior PCCM note this has been difficult to facilitate: "unable to contact husband who has a broken phone. Neighbors number we have is on vacation  until tomorrow. If something urgent friends number (see ED note). She lives 40 mins away but can drive to the husband if need be." Disposition: Likely stable for transfer out of ICU, favor to cardiac floor   Labs   CBC: Recent Labs  Lab 05/19/20 0655 05/19/20 0845 05/19/20 2007 05/21/20 0331  WBC 14.4*  --   --  12.0*  NEUTROABS 12.2*  --   --   --   HGB 12.5 12.2 11.9* 11.6*  HCT 39.6 36.0 35.0* 35.4*  MCV 107.6*  --   --  103.8*  PLT 151  --   --  138*    Basic Metabolic Panel: Recent Labs  Lab 05/19/20 0655 05/19/20 0655 05/19/20 0845 05/19/20 1725 05/19/20 2007 05/20/20 0357 05/20/20 1638  NA 143   < > 143 145 143 143 144  K 3.5   < > 3.0* 2.9* 3.0* 3.8 4.0  CL 105  --   --  104  --  103 102  CO2 26  --   --  25  --  27 28  GLUCOSE 101*  --   --  129*  --  153* 136*  BUN 14  --   --  18  --  21 28*  CREATININE 1.36*  --   --  1.59*  --  1.68* 1.77*  CALCIUM  8.5*  --   --  8.7*  --  8.4* 8.6*  MG  --   --   --  1.3*  --  1.3*  --   PHOS  --   --   --   --   --  3.5  --    < > = values in this interval not displayed.   GFR: Estimated Creatinine Clearance: 34.4 mL/min (A) (by C-G formula based on SCr of 1.77 mg/dL (H)). Recent Labs  Lab 05/19/20 0655 05/19/20 1415 05/19/20 1725 05/20/20 0357 05/21/20 0331  PROCALCITON  --   --  5.78 9.06  --   WBC 14.4*  --   --   --  12.0*  LATICACIDVEN  --  1.9  --   --   --     Liver Function Tests: Recent Labs  Lab 05/19/20 0655 05/19/20 1725  AST 52* 44*  ALT 33 33  ALKPHOS 75 74  BILITOT 1.5* 1.5*  PROT 5.6* 5.8*  ALBUMIN 2.9* 3.0*   Recent Labs  Lab 05/19/20 0655  LIPASE 22   Recent Labs  Lab 05/19/20 1103  AMMONIA 18    ABG    Component Value Date/Time   PHART 7.398 05/19/2020 2007   PCO2ART 47.4 05/19/2020 2007   PO2ART 117 (H) 05/19/2020 2007   HCO3 29.4 (H) 05/19/2020 2007   TCO2 31 05/19/2020 2007   O2SAT 99.0 05/19/2020 2007     Coagulation Profile: Recent Labs  Lab  05/20/20 1127  INR 1.3*    Cardiac Enzymes: No results for input(s): CKTOTAL, CKMB, CKMBINDEX, TROPONINI in the last 168 hours.  HbA1C: HbA1c POC (<> result, manual entry)  Date/Time Value Ref Range Status  09/29/2018 10:51 AM 4.6 4.0 - 5.6 % Final   Hgb A1c MFr Bld  Date/Time Value Ref Range Status  09/29/2018 01:23 PM 4.9 4.8 - 5.6 % Final    Comment:    (NOTE) Pre diabetes:          5.7%-6.4% Diabetes:              >6.4% Glycemic control for   <7.0% adults with diabetes   03/06/2018 09:54 AM 5.2 <5.7 % of total Hgb Final    Comment:    For the purpose of screening for the presence of diabetes: . <5.7%       Consistent with the absence of diabetes 5.7-6.4%    Consistent with increased risk for diabetes             (prediabetes) > or =6.5%  Consistent with diabetes . This assay result is consistent with a decreased risk of diabetes. . Currently, no consensus exists regarding use of hemoglobin A1c for diagnosis of diabetes in children. . According to American Diabetes Association (ADA) guidelines, hemoglobin A1c <7.0% represents optimal control in non-pregnant diabetic patients. Different metrics may apply to specific patient populations.  Standards of Medical Care in Diabetes(ADA). .     CBG: Recent Labs  Lab 05/20/20 1505 05/20/20 1915 05/20/20 2348 05/21/20 0340 05/21/20 [place] MSN, AGACNP-BC [place] [phone] If no answer, [phone] 05/21/2020, 8:32 AM

## 2020-05-21 NOTE — Progress Notes (Signed)
ANTICOAGULATION CONSULT NOTE  Pharmacy Consult:  Heparin Indication: chest pain/ACS, atrial fibrillation  Allergies  Allergen Reactions  . Allopurinol Palpitations  . Uloric [Febuxostat] Other (See Comments)    Episodes of AFIB  . Ace Inhibitors Other (See Comments)    Renal function  . Colchicine Other (See Comments)    palpitations  . Cymbalta [Duloxetine Hcl] Other (See Comments)    palpitations  . Etodolac Other (See Comments)    heart flutters and headaches   . Fluoxetine Other (See Comments)    Jitteriness  . Omeprazole Nausea Only  . Paroxetine Nausea Only    Dizzy  . Solu-Medrol [Methylprednisolone Sodium Succ]     Heart palpitations/bad headache  . Statins Tinitus    REACTION: myalgias REACTION: myalgias  . Lasix [Furosemide] Rash    Does not work very well  . Penicillins Rash    Tolerated Ceftriaxone 4/10-4/11/20 \ Did it involve swelling of the face/tongue/throat, SOB, or low BP? No Did it involve sudden or severe rash/hives, skin peeling, or any reaction on the inside of your mouth or nose? No Did you need to seek medical attention at a hospital or doctor's office? No When did it last happen?35 years ago If all above answers are "NO", may proceed with cephalosporin use.    Patient Measurements: Height: 5\' 6"  (167.6 cm) Weight: 112.9 kg (248 lb 14.4 oz) IBW/kg (Calculated) : 59.3 Heparin Dosing Weight: 89.4 kg  Vital Signs: Temp: 97.7 F (36.5 C) (07/07 1136) Temp Source: Oral (07/07 1136) BP: 124/78 (07/07 1300) Pulse Rate: 60 (07/07 1300)  Labs: Recent Labs     0000 05/19/20 0655 05/19/20 0655 05/19/20 0845 05/19/20 1527 05/19/20 1650 05/19/20 1725 05/19/20 2007 05/20/20 0357 05/20/20 1127 05/20/20 1638 05/21/20 0331 05/21/20 1135  HGB   < > 12.5   < > 12.2  --   --   --  11.9*  --   --   --  11.6*  --   HCT  --  39.6   < > 36.0  --   --   --  35.0*  --   --   --  35.4*  --   PLT  --  151  --   --   --   --   --   --   --   --    --  138*  --   APTT  --   --   --   --   --   --   --   --  76* 68*  --   --  49*  LABPROT  --   --   --   --   --   --   --   --   --  15.6*  --   --   --   INR  --   --   --   --   --   --   --   --   --  1.3*  --   --   --   HEPARINUNFRC  --   --   --   --   --   --   --   --  0.63 0.54  --   --  0.30  CREATININE  --  1.36*  --   --   --   --    < >  --  1.68*  --  1.77*  --  1.99*  TROPONINIHS  --   --   --   --  1,137* 1,836*  --   --   --   --   --   --   --    < > = values in this interval not displayed.    Estimated Creatinine Clearance: 30.6 mL/min (A) (by C-G formula based on SCr of 1.99 mg/dL (H)).   Assessment: 79 year old woman with history of atrial fibrillation on Xarelto PTA, last dose unknown.  Admitted with SOB/hypoxia, AoC systolic HF and elevated troponin. Pharmacy is consulted to dose heparin.  APTT is sub-therapeutic.  No issue with heparin infusion nor bleeding per RN.  Goal of Therapy:  APTT 66-102 sec Heparin level 0.3-0.7 units/ml Monitor platelets by anticoagulation protocol: Yes   Plan:  Increase heparin gtt to 1300 units/hr Check 8 hr aPTT  Daily heparin level, aPTT and CBC  Sopheap Boehle D. Mina Marble, PharmD, BCPS, [place] 05/21/2020, 2:08 PM

## 2020-05-21 NOTE — Evaluation (Signed)
Physical Therapy Evaluation Patient Details Name: Nicole Norton MRN: [id number] DOB: 1941-05-29 Today's Date: 05/21/2020   History of Present Illness  Nicole Norton is a 79 y.o. 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  Reportedly had been having falls at home.  History is somewhat difficult to obtain as the patient is in distress and altered.  Notes associated symptoms of difficulty breathing, chest pain, abdominal pain, lower extremity swelling, nausea, and abdominal pain.   Clinical Impression  Pt admitted with/for LBP and recent falls with associated symptoms described above..  Pt currently limited functionally due to the problems listed. ( See problems list.)   Pt will benefit from PT to maximize function and safety in order to get ready for next venue listed below.     Follow Up Recommendations CIR;Supervision/Assistance - 24 hour    Equipment Recommendations  None recommended by PT;Other (comment) (TBA)    Recommendations for Other Services Rehab consult     Precautions / Restrictions Precautions Precautions: Fall      Mobility  Bed Mobility Overal bed mobility: Needs Assistance Bed Mobility: Supine to Sit;Sit to Supine     Supine to sit: Min assist;HOB elevated (rail) Sit to supine: Mod assist   General bed mobility comments: need for truncal assist up and LE assist back in bed.  Transfers Overall transfer level: Needs assistance   Transfers: Sit to/from Stand;Stand Pivot Transfers Sit to Stand: Min assist Stand pivot transfers: Min assist       General transfer comment: pt needs minimal assist when surface in not elevated or  does not have armrest, otherwise needs guarding.  Ambulation/Gait             General Gait Details: NT  Stairs            Wheelchair Mobility    Modified Rankin (Stroke Patients Only)       Balance                                             Pertinent Vitals/Pain Pain Assessment: Faces Faces Pain Scale: No hurt Pain Intervention(s): Monitored during session    Home Living Family/patient expects to be discharged to:: Unsure Living Arrangements: Spouse/significant other Available Help at Discharge: Other (Comment);Available PRN/intermittently (has a housekeeper like family that can assist some) Type of Home: House Home Access: Stairs to enter   [employer] of Steps: 1 Home Layout: One level Home Equipment: [place] - 4 wheels;Cane - single point;Shower seat;Grab bars - toilet;Grab bars - tub/shower      Prior Function Level of Independence: Independent with assistive device(s)         Comments: pt could drive, run some errands.  Could take a shower and do other ADL's.     Hand Dominance        Extremity/Trunk Assessment   Upper Extremity Assessment Upper Extremity Assessment: Overall WFL for tasks assessed    Lower Extremity Assessment Lower Extremity Assessment: Generalized weakness    Cervical / Trunk Assessment Cervical / Trunk Assessment: Kyphotic  Communication   Communication: No difficulties  Cognition Arousal/Alertness: Awake/alert Behavior During  Therapy: WFL for tasks assessed/performed Overall Cognitive Status: Within Functional Limits for tasks assessed                                        General Comments General comments (skin integrity, edema, etc.): vss    Exercises     Assessment/Plan    PT Assessment Patient needs continued PT services  PT Problem List Decreased strength;Decreased activity tolerance;Decreased balance;Decreased mobility;Decreased knowledge of use of  DME       PT Treatment Interventions Gait training;DME instruction;Functional mobility training;Therapeutic activities;Balance training;Neuromuscular re-education;Patient/family education    PT Goals (Current goals can be found in the Care Plan section)  Acute Rehab PT Goals Patient Stated Goal: Figure out how to go back home and have enough assist PT Goal Formulation: With patient Time For Goal Achievement: 06/04/20 Potential to Achieve Goals: Good    Frequency Min 3X/week   Barriers to discharge        Co-evaluation               AM-PAC PT "6 Clicks" Mobility  Outcome Measure Help needed turning from your back to your side while in a flat bed without using bedrails?: A Little Help needed moving from lying on your back to sitting on the side of a flat bed without using bedrails?: A Little Help needed moving to and from a bed to a chair (including a wheelchair)?: A Lot Help needed standing up from a chair using your arms (e.g., wheelchair or bedside chair)?: A Lot Help needed to walk in hospital room?: A Lot Help needed climbing 3-5 steps with a railing? : A Lot 6 Click Score: 14    End of Session Equipment Utilized During Treatment: Oxygen Activity Tolerance: Patient tolerated treatment well Patient left: in bed;with call bell/phone within reach;with bed alarm set Nurse Communication: Mobility status PT Visit Diagnosis: Unsteadiness on feet (R26.81);Other abnormalities of gait and mobility (R26.89);Muscle weakness (generalized) (M62.81);Difficulty in walking, not elsewhere classified (R26.2)    Time: 8502-7741 PT Time Calculation (min) (ACUTE ONLY): 48 min   Charges:   PT Evaluation $PT Eval Moderate Complexity: 1 Mod PT Treatments $Therapeutic Activity: 23-37 mins        05/21/2020  Ginger Carne., PT Acute Rehabilitation Services [phone]  (pager) [phone]  (office)  Tessie Fass Rhyann Berton 05/21/2020, 4:48 PM

## 2020-05-22 DIAGNOSIS — N179 Acute kidney failure, unspecified: Secondary | ICD-10-CM

## 2020-05-22 DIAGNOSIS — N1831 Chronic kidney disease, stage 3a: Secondary | ICD-10-CM

## 2020-05-22 LAB — APTT
aPTT: 68 seconds — ABNORMAL HIGH (ref 24–36)
aPTT: 75 seconds — ABNORMAL HIGH (ref 24–36)

## 2020-05-22 LAB — CBC
HCT: 36.1 % (ref 36.0–46.0)
Hemoglobin: 11.4 g/dL — ABNORMAL LOW (ref 12.0–15.0)
MCH: 33.4 pg (ref 26.0–34.0)
MCHC: 31.6 g/dL (ref 30.0–36.0)
MCV: 105.9 fL — ABNORMAL HIGH (ref 80.0–100.0)
Platelets: 168 10*3/uL (ref 150–400)
RBC: 3.41 MIL/uL — ABNORMAL LOW (ref 3.87–5.11)
RDW: 14.1 % (ref 11.5–15.5)
WBC: 8.9 10*3/uL (ref 4.0–10.5)
nRBC: 0 % (ref 0.0–0.2)

## 2020-05-22 LAB — BASIC METABOLIC PANEL
Anion gap: 11 (ref 5–15)
BUN: 36 mg/dL — ABNORMAL HIGH (ref 8–23)
CO2: 31 mmol/L (ref 22–32)
Calcium: 9.1 mg/dL (ref 8.9–10.3)
Chloride: 101 mmol/L (ref 98–111)
Creatinine, Ser: 2.2 mg/dL — ABNORMAL HIGH (ref 0.44–1.00)
GFR calc Af Amer: 24 mL/min — ABNORMAL LOW (ref >60)
GFR calc non Af Amer: 21 mL/min — ABNORMAL LOW (ref >60)
Glucose, Bld: 89 mg/dL (ref 70–99)
Potassium: 4.2 mmol/L (ref 3.5–5.1)
Sodium: 143 mmol/L (ref 135–145)

## 2020-05-22 LAB — GLUCOSE, CAPILLARY
Glucose-Capillary: 93 mg/dL (ref 70–99)
Glucose-Capillary: 115 mg/dL — ABNORMAL HIGH (ref 70–99)

## 2020-05-22 LAB — HEPARIN LEVEL (UNFRACTIONATED): Heparin Unfractionated: 0.37 IU/mL (ref 0.30–0.70)

## 2020-05-22 LAB — MAGNESIUM: Magnesium: 1.9 mg/dL (ref 1.7–2.4)

## 2020-05-22 MED ORDER — PREGABALIN 75 MG PO CAPS
75.0000 mg | ORAL_CAPSULE | Freq: Two times a day (BID) | ORAL | Status: DC
  Administered 2020-05-22 – 2020-05-26 (×8): 75 mg via ORAL
  Filled 2020-05-22 (×8): qty 1

## 2020-05-22 NOTE — Progress Notes (Signed)
ANTICOAGULATION CONSULT NOTE - Follow Up Consult  Pharmacy Consult for heparin Indication: NSTEMI and Afib  Labs: Recent Labs     0000 05/19/20 0655 05/19/20 0655 05/19/20 0845 05/19/20 1527 05/19/20 1650 05/19/20 1725 05/19/20 2007 05/20/20 0357 05/20/20 0357 05/20/20 1127 05/20/20 1638 05/21/20 0331 05/21/20 1135 05/21/20 2336  HGB   < > 12.5   < > 12.2  --   --   --  11.9*  --   --   --   --  11.6*  --   --   HCT  --  39.6   < > 36.0  --   --   --  35.0*  --   --   --   --  35.4*  --   --   PLT  --  151  --   --   --   --   --   --   --   --   --   --  138*  --   --   APTT  --   --   --   --   --   --   --   --  76*   < > 68*  --   --  49* 68*  LABPROT  --   --   --   --   --   --   --   --   --   --  15.6*  --   --   --   --   INR  --   --   --   --   --   --   --   --   --   --  1.3*  --   --   --   --   HEPARINUNFRC  --   --   --   --   --   --   --   --  0.63  --  0.54  --   --  0.30  --   CREATININE  --  1.36*  --   --   --   --    < >  --  1.68*  --   --  1.77*  --  1.99*  --   TROPONINIHS  --   --   --   --  1,137* 1,836*  --   --   --   --   --   --   --   --   --    < > = values in this interval not displayed.    Assessment/Plan:  79yo female therapeutic on heparin after rate change. Will continue gtt at current rate and confirm stable with am labs.   Wynona Neat, PharmD, BCPS  05/22/2020,12:16 AM

## 2020-05-22 NOTE — Progress Notes (Signed)
Progress Note  Patient Name: Nicole Norton Date of Encounter: 05/22/2020  [facility] Cardiologist: Quay Burow, MD   Subjective   Feels stronger. Up to use commode 5 times yesterday. Has mild anterior chest discomfort with deep breathing. Still on O2 5 l.min (home 2l/min chronically). Good diuresis, another 1 liter net fluid removal (-2.5 liters since admission). Weight down about 5 lb since admission. Creat increased further to 2.2. Mild bradycardia (50s) when awake, moderate bradycardia (40s) at night. No afib seen last 24 h. BP lower, now mostly in desirable range.  Inpatient Medications    Scheduled Meds: . amiodarone  200 mg Oral Daily  . aspirin EC  81 mg Oral Daily  . bumetanide (BUMEX) IV  2 mg Intravenous Q12H  . Chlorhexidine Gluconate Cloth  6 each Topical Daily  . clopidogrel  75 mg Oral Daily  . doxycycline  100 mg Per Tube Q12H  . guaiFENesin  600 mg Oral BID  . levothyroxine  200 mcg Oral QAC breakfast  . mouth rinse  15 mL Mouth Rinse BID  . metoprolol succinate  25 mg Oral Daily  . pantoprazole  40 mg Oral Daily  . potassium chloride  40 mEq Oral Daily  . predniSONE  40 mg Oral Daily  . pregabalin  75 mg Oral TID   Continuous Infusions: . cefTRIAXone (ROCEPHIN)  IV Stopped (05/21/20 1402)  . heparin 1,300 Units/hr (05/22/20 0700)   PRN Meds: acetaminophen, diazepam, docusate sodium, ipratropium-albuterol, levalbuterol, ondansetron (ZOFRAN) IV, oxyCODONE, polyethylene glycol   Vital Signs    Vitals:   05/22/20 0411 05/22/20 0500 05/22/20 0600 05/22/20 0700  BP: 110/63 99/60 118/72 138/79  Pulse: (!) 43 (!) 43 (!) 46 (!) 56  Resp: 13 13 15 17   Temp:      TempSrc:      SpO2: 100% 99% 100% 100%  Weight:  112.2 kg    Height:        Intake/Output Summary (Last 24 hours) at 05/22/2020 0842 Last data filed at 05/22/2020 0700 Gross per 24 hour  Intake 1289.22 ml  Output 1850 ml  Net -560.78 ml   Last 3 Weights 05/22/2020 05/21/2020  05/20/2020  Weight (lbs) 247 lb 5.7 oz 248 lb 14.4 oz 251 lb 15.8 oz  Weight (kg) 112.2 kg 112.9 kg 114.3 kg      Telemetry    NSR/sinus bradycardia - Personally Reviewed  ECG    Sinus bradycardia, no Q waves and no acute ST-T changes, long QT 572 ms at 47 bpm (QTc 506) - Personally Reviewed  Physical Exam  Obese GEN: No acute distress.   Neck: No JVD Cardiac: RRR, no murmurs, rubs, or gallops.  Respiratory: Crackles in bases bilaterally. GI: Soft, nontender, non-distended  MS: No edema; No deformity. Neuro:  Nonfocal  Psych: Normal affect   Labs    High Sensitivity Troponin:   Recent Labs  Lab 05/19/20 1527 05/19/20 1650  TROPONINIHS 1,137* 1,836*      Chemistry Recent Labs  Lab 05/19/20 0655 05/19/20 0845 05/19/20 1725 05/19/20 2007 05/20/20 1638 05/21/20 1135 05/22/20 0327  NA 143   < > 145   < > 144 141 143  K 3.5   < > 2.9*   < > 4.0 4.3 4.2  CL 105   < > 104   < > 102 101 101  CO2 26   < > 25   < > 28 26 31   GLUCOSE 101*   < > 129*   < >  136* 119* 89  BUN 14   < > 18   < > 28* 34* 36*  CREATININE 1.36*   < > 1.59*   < > 1.77* 1.99* 2.20*  CALCIUM 8.5*   < > 8.7*   < > 8.6* 8.7* 9.1  PROT 5.6*  --  5.8*  --   --   --   --   ALBUMIN 2.9*  --  3.0*  --   --   --   --   AST 52*  --  44*  --   --   --   --   ALT 33  --  33  --   --   --   --   ALKPHOS 75  --  74  --   --   --   --   BILITOT 1.5*  --  1.5*  --   --   --   --   GFRNONAA 38*   < > 31*   < > 27* 24* 21*  GFRAA 44*   < > 36*   < > 32* 28* 24*  ANIONGAP 12   < > 16*   < > 14 14 11    < > = values in this interval not displayed.     Hematology Recent Labs  Lab 05/19/20 0655 05/19/20 0845 05/19/20 2007 05/21/20 0331 05/22/20 0327  WBC 14.4*  --   --  12.0* 8.9  RBC 3.68*  --   --  3.41* 3.41*  HGB 12.5   < > 11.9* 11.6* 11.4*  HCT 39.6   < > 35.0* 35.4* 36.1  MCV 107.6*  --   --  103.8* 105.9*  MCH 34.0  --   --  34.0 33.4  MCHC 31.6  --   --  32.8 31.6  RDW 14.1  --   --  14.3  14.1  PLT 151  --   --  138* 168   < > = values in this interval not displayed.    BNP Recent Labs  Lab 05/19/20 0655 05/19/20 1727  BNP 953.0* 4,213.3*     DDimer No results for input(s): DDIMER in the last 168 hours.   Radiology    ECHOCARDIOGRAM COMPLETE  Result Date: 05/20/2020    ECHOCARDIOGRAM REPORT   Patient Name:   Nicole Norton Date of Exam: 05/20/2020 Medical Rec #:  [id number]              Height:       66.0 in Accession #:    [id number]             Weight:       252.0 lb Date of Birth:  [birth year]              BSA:          2.206 m Patient Age:    79 years               BP:           124/77 mmHg Patient Gender: F                      HR:           58 bpm. Exam Location:  Inpatient Procedure: 2D Echo Indications:    systolic heart failure  History:        Patient has prior history of Echocardiogram examinations, most  recent 05/14/2019. Risk Factors:Hypertension, Dyslipidemia and                 Former Smoker. Persistent a-fib. cardioversion hx.  Sonographer:    Jannett Celestine RDCS (AE) Referring Phys: [id number] GRACE E BOWSER  Sonographer Comments: Image acquisition challenging due to patient body habitus and Image acquisition challenging due to respiratory motion. restricted mobility IMPRESSIONS  1. Technically difficult; LV function appears low normal; grade 2 diastolic dysfunction; mild LVE; mild MR and TR.  2. Left ventricular ejection fraction, by estimation, is 50 to 55%. The left ventricle has low normal function. The left ventricle has no regional wall motion abnormalities. The left ventricular internal cavity size was mildly dilated. Left ventricular diastolic parameters are consistent with Grade II diastolic dysfunction (pseudonormalization).  3. Right ventricular systolic function is normal. The right ventricular size is normal. There is mildly elevated pulmonary artery systolic pressure.  4. The mitral valve is normal in structure. Mild mitral valve  regurgitation. No evidence of mitral stenosis.  5. The aortic valve is tricuspid. Aortic valve regurgitation is not visualized. Mild aortic valve sclerosis is present, with no evidence of aortic valve stenosis.  6. The inferior vena cava is normal in size with greater than 50% respiratory variability, suggesting right atrial pressure of 3 mmHg. FINDINGS  Left Ventricle: Left ventricular ejection fraction, by estimation, is 50 to 55%. The left ventricle has low normal function. The left ventricle has no regional wall motion abnormalities. The left ventricular internal cavity size was mildly dilated. There is no left ventricular hypertrophy. Left ventricular diastolic parameters are consistent with Grade II diastolic dysfunction (pseudonormalization). Right Ventricle: The right ventricular size is normal.Right ventricular systolic function is normal. There is mildly elevated pulmonary artery systolic pressure. The tricuspid regurgitant velocity is 3.00 m/s, and with an assumed right atrial pressure of  8 mmHg, the estimated right ventricular systolic pressure is 91.4 mmHg. Left Atrium: Left atrial size was normal in size. Right Atrium: Right atrial size was normal in size. Pericardium: There is no evidence of pericardial effusion. Mitral Valve: The mitral valve is normal in structure. Normal mobility of the mitral valve leaflets. Mild mitral annular calcification. Mild mitral valve regurgitation. No evidence of mitral valve stenosis. Tricuspid Valve: The tricuspid valve is normal in structure. Tricuspid valve regurgitation is mild . No evidence of tricuspid stenosis. Aortic Valve: The aortic valve is tricuspid. Aortic valve regurgitation is not visualized. Mild aortic valve sclerosis is present, with no evidence of aortic valve stenosis. Pulmonic Valve: The pulmonic valve was normal in structure. Pulmonic valve regurgitation is not visualized. No evidence of pulmonic stenosis. Aorta: The aortic root is normal in size  and structure. Venous: The inferior vena cava is normal in size with greater than 50% respiratory variability, suggesting right atrial pressure of 3 mmHg. IAS/Shunts: No atrial level shunt detected by color flow Doppler. Additional Comments: Technically difficult; LV function appears low normal; grade 2 diastolic dysfunction; mild LVE; mild MR and TR.  LEFT VENTRICLE PLAX 2D LVIDd:         5.50 cm  Diastology LVIDs:         4.00 cm  LV e' lateral:   8.16 cm/s LV PW:         1.20 cm  LV E/e' lateral: 9.9 LV IVS:        1.00 cm  LV e' medial:    5.44 cm/s LVOT diam:     2.00 cm  LV E/e' medial:  14.9  LV SV:         67 LV SV Index:   30 LVOT Area:     3.14 cm  RIGHT VENTRICLE RV S prime:     11.90 cm/s LEFT ATRIUM           Index LA diam:      4.90 cm 2.22 cm/m LA Vol (A4C): 65.6 ml 29.74 ml/m  AORTIC VALVE LVOT Vmax:   95.80 cm/s LVOT Vmean:  65.100 cm/s LVOT VTI:    0.212 m  AORTA Ao Root diam: 2.70 cm MITRAL VALVE               TRICUSPID VALVE MV Area (PHT): 2.91 cm    TR Peak grad:   36.0 mmHg MV Decel Time: 261 msec    TR Vmax:        300.00 cm/s MV E velocity: 81.00 cm/s MV A velocity: 69.80 cm/s  SHUNTS MV E/A ratio:  1.16        Systemic VTI:  0.21 m                            Systemic Diam: 2.00 cm Kirk Ruths MD Electronically signed by Kirk Ruths MD Signature Date/Time: 05/20/2020/2:12:37 PM    Final     Cardiac Studies   Echo as above. On my review there is regional hypokinesis in the inferior/inferoseptal walls.  Patient Profile     79 y.o. 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  NSTEMI:suspect demand ischemia, but underlying coronary status is uncertain. Probably has RCA stenosis. Denies angina. Definitely has coronary risk factors (age, HTN, hypercholesterolemia). Normal perfusion study in 2005, normal ETT 2017. Currently on IV heparin. On beta blockers with mild bradycardia when in sinus rhythm. Not a good candidate for coronary angio or CT angio at this time. Plan outpatient nuclear perfusion study as an outpatient. In the meantime, start clopidogrel (lower risk of bleeding with Xarelto than ASA).  Currently on IV heparin until renal function allows restarting DOAC. 2. 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 ECG without acute ischemia or old Q waves. No clinical evidence of hypervolemia at this time. 3. PAfib:previously documented. Had persistent AFib requiring DCCV x 2 one year ago. CHADSVasc 5-6 (age 19, gender, HTN, HF, +/- CAD). On anticoagulation prior to admission (Xarelto), currently DOAC on hold due to AKI. Mild RVR earlier this admission, now back in NSR/mild bradycardia on amio + beta blocker. 4. Amiodarone:lung issues precede treatment w amiodarone. Minimum increase in AST (from MI?) is improving and other LFTs are normal, as is TSH. 5. Long QT: amiodarone-related, electrolytes have been corrected. Avoid other QT prolonging drugs (azithro has been switched to doxycycline) and Today's labs pending. Recheck ECG after MG corrected. 6. AKI: recommend stopping the diuretics, continue to monitor renal function. Had contrast for CTA on 7/5, could be a component of CIN. 7. Ac on chronic respiratory failure: Multifactorial. Pneumonia and HF on top of chronic interstitial lung disease. On ABx and steroids. Improving. 8. 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 Note marked dilation of main PA (  4.3 cm) suggesting chronicity, whereas left heart failure is a relatively recent problem 9.  Aortic atherosclerosis:noted on chest CT. Increases odds of CAD, but there is only minimal coronary calcification (prox RCA and mid LCX on my review of non-coronary chest CT). Needs lipid lowering to target LDL<70. 10. HLP: LDL 166 in March. Statin intolerant due to myalgia. Will need to start [place] or Praluent. Refer to lipid clinic at [place].         For questions or updates, please contact [place] Please consult [email or web address] for contact info under        Signed, Sanda Klein, MD  05/22/2020, 8:42 AM

## 2020-05-22 NOTE — Progress Notes (Signed)
ANTICOAGULATION CONSULT NOTE  Pharmacy Consult:  Heparin Indication: chest pain/ACS, atrial fibrillation  Allergies  Allergen Reactions  . Allopurinol Palpitations  . Uloric [Febuxostat] Other (See Comments)    Episodes of AFIB  . Ace Inhibitors Other (See Comments)    Renal function  . Colchicine Other (See Comments)    palpitations  . Cymbalta [Duloxetine Hcl] Other (See Comments)    palpitations  . Etodolac Other (See Comments)    heart flutters and headaches   . Fluoxetine Other (See Comments)    Jitteriness  . Omeprazole Nausea Only  . Paroxetine Nausea Only    Dizzy  . Solu-Medrol [Methylprednisolone Sodium Succ]     Heart palpitations/bad headache  . Statins Tinitus    REACTION: myalgias REACTION: myalgias  . Lasix [Furosemide] Rash    Does not work very well  . Penicillins Rash    Tolerated Ceftriaxone 4/10-4/11/20 \ Did it involve swelling of the face/tongue/throat, SOB, or low BP? No Did it involve sudden or severe rash/hives, skin peeling, or any reaction on the inside of your mouth or nose? No Did you need to seek medical attention at a hospital or doctor's office? No When did it last happen?35 years ago If all above answers are "NO", may proceed with cephalosporin use.    Patient Measurements: Height: 5\' 6"  (167.6 cm) Weight: 112.2 kg (247 lb 5.7 oz) IBW/kg (Calculated) : 59.3 Heparin Dosing Weight: 89.4 kg  Vital Signs: Temp: 98 F (36.7 C) (07/08 0400) Temp Source: Oral (07/08 0400) BP: 138/79 (07/08 0700) Pulse Rate: 56 (07/08 0700)  Labs: Recent Labs    05/19/20 1527 05/19/20 1650 05/19/20 1725 05/19/20 2007 05/19/20 2007 05/20/20 0357 05/20/20 1127 05/20/20 1127 05/20/20 1638 05/21/20 0331 05/21/20 1135 05/21/20 2336 05/22/20 0327  HGB  --   --   --  11.9*   < >  --   --   --   --  11.6*  --   --  11.4*  HCT  --   --   --  35.0*  --   --   --   --   --  35.4*  --   --  36.1  PLT  --   --   --   --   --   --   --   --   --   138*  --   --  168  APTT  --   --   --   --   --    < > 68*   < >  --   --  49* 68* 75*  LABPROT  --   --   --   --   --   --  15.6*  --   --   --   --   --   --   INR  --   --   --   --   --   --  1.3*  --   --   --   --   --   --   HEPARINUNFRC  --   --   --   --   --    < > 0.54  --   --   --  0.30  --  0.37  CREATININE  --   --    < >  --   --    < >  --   --  1.77*  --  1.99*  --  2.20*  TROPONINIHS  1,137* 1,836*  --   --   --   --   --   --   --   --   --   --   --    < > = values in this interval not displayed.    Estimated Creatinine Clearance: 27.6 mL/min (A) (by C-G formula based on SCr of 2.2 mg/dL (H)).   Assessment: 79 year old woman with history of atrial fibrillation on Xarelto PTA, last dose unknown.  Admitted with SOB/hypoxia, AoC systolic HF and elevated troponin. Pharmacy is consulted to dose heparin.  Heparin level and aPTT are both therapeutic, now correlating.  No bleeding reported.  Goal of Therapy:  APTT 66-102 sec Heparin level 0.3-0.7 units/ml Monitor platelets by anticoagulation protocol: Yes   Plan:  Continue heparin gtt at 1300 units/hr Daily heparin level and CBC D/C daily aPTT  Brennan Litzinger D. Mina Marble, PharmD, BCPS, [place] 05/22/2020, 9:03 AM

## 2020-05-22 NOTE — Evaluation (Signed)
Occupational Therapy Evaluation Patient Details Name: Nicole Norton MRN: [id number] DOB: [birth year] Today's Date: 05/22/2020    History of Present Illness Nicole Norton is a 79 y.o. 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  Reportedly had been having falls at home.  History is somewhat difficult to obtain as the patient is in distress and altered.  Notes associated symptoms of difficulty breathing, chest pain, abdominal pain, lower extremity swelling, nausea, and abdominal pain.    Clinical Impression   PTA, pt was living at home with her husband, pt was independent with ADL/IADL and modified independent with functional mobility at rollator level. Pt currently requires minguard for functional mobility at RW level. She demonstrates decreased activity tolerance and is limited in standing duration due to increased hip and L knee pain. Pt completed hair care at sink level. Due to decline in current level of function, pt would benefit from acute OT to address established goals to facilitate safe D/C to venue listed below. At this time, recommend CIR follow-up. Will continue to follow acutely.   Pt reports he husband was recently d/c home from hospital and has assistance during the day. Pt is unsure how much assistance he has. Pt and husband have an aide that assists with home management. Pt agrees she and her husband will need more assistance than what they currently had in place with their aide.   Follow Up Recommendations  CIR    Equipment Recommendations  3 in 1 bedside commode    Recommendations for Other Services       Precautions / Restrictions  Precautions Precautions: Fall Restrictions Weight Bearing Restrictions: No      Mobility Bed Mobility Overal bed mobility: Needs Assistance             General bed mobility comments: pt sitting in recliner upon arrival  Transfers Overall transfer level: Needs assistance Equipment used: Rolling walker (2 wheeled) Transfers: Sit to/from Stand Sit to Stand: Min guard         General transfer comment: vc for safe hand placement;minguard for safety    Balance Overall balance assessment: Needs assistance Sitting-balance support: No upper extremity supported;Feet supported Sitting balance-Leahy Scale: Good     Standing balance support: No upper extremity supported;During functional activity Standing balance-Leahy Scale: Fair Standing balance comment: able to stand without UE support, did not challenge standing balance                           ADL either performed or assessed with clinical judgement   ADL Overall ADL's : Needs assistance/impaired Eating/Feeding: Set up;Sitting   Grooming: Min guard;Sitting;Standing Grooming Details (indicate cue type and reason): pt washed hair in shower cap while standing at sink, required completion of ADL in sitting due to increased pain in her hip due to arthritis Upper Body Bathing: Set up;Sitting   Lower Body Bathing: Min guard;Sit to/from stand   Upper Body Dressing : Set up;Sitting   Lower Body Dressing: Minimal assistance;Sit to/from stand Lower Body Dressing Details (indicate cue type and reason): minA to access feet Toilet Transfer: Min guard;Ambulation;RW Toilet Transfer Details (indicate cue type and reason): ambulated into hallway and back Toileting- Clothing Manipulation and Hygiene:  Min guard;Sit to/from stand Toileting - Clothing Manipulation Details (indicate cue type and reason): vc for safe hand placement     Functional mobility during ADLs: Min guard;Rolling walker General ADL Comments: minguard  for safety, pt reports increased pain in L knee and hip with prolonged standing 1x minor buckle of L knee     Vision Baseline Vision/History: Wears glasses Patient Visual Report: No change from baseline       Perception     Praxis      Pertinent Vitals/Pain Pain Assessment: Faces Faces Pain Scale: Hurts little more Pain Location: L hip and knee Pain Descriptors / Indicators: Grimacing;Guarding;Sore Pain Intervention(s): Limited activity within patient's tolerance;Monitored during session;Patient requesting pain meds-RN notified     Hand Dominance Right   Extremity/Trunk Assessment Upper Extremity Assessment Upper Extremity Assessment: Overall WFL for tasks assessed   Lower Extremity Assessment Lower Extremity Assessment: Generalized weakness   Cervical / Trunk Assessment Cervical / Trunk Assessment: Kyphotic   Communication Communication Communication: No difficulties   Cognition Arousal/Alertness: Awake/alert Behavior During Therapy: WFL for tasks assessed/performed Overall Cognitive Status: Within Functional Limits for tasks assessed                                     General Comments  pt on 5lnc upon arrival SpO2 >97%, pt on 4lnc during functional activity SpO2 >93%     Exercises     Shoulder Instructions      Home Living Family/patient expects to be discharged to:: Unsure Living Arrangements: Spouse/significant other Available Help at Discharge: Other (Comment);Available PRN/intermittently (has a housekeeper like family that can assist some) Type of Home: House Home Access: Stairs to enter [employer] of Steps: 1   Home Layout: One level     Bathroom Shower/Tub: Tub/shower unit;Walk-in shower   Bathroom Toilet: Handicapped height Bathroom Accessibility: No   Home Equipment: Walker - 4 wheels;Cane - single point;Shower seat;Grab bars - toilet;Grab bars - tub/shower          Prior Functioning/Environment Level of  Independence: Independent with assistive device(s)        Comments: pt could drive, run some errands.  Could take a shower and do other ADL's.        OT Problem List: Decreased activity tolerance;Impaired balance (sitting and/or standing);Decreased safety awareness;Decreased knowledge of use of DME or AE;Decreased knowledge of precautions;Cardiopulmonary status limiting activity;Obesity;Pain      OT Treatment/Interventions: Self-care/ADL training;Therapeutic exercise;Energy conservation;DME and/or AE instruction;Cognitive remediation/compensation;Therapeutic activities;Patient/family education;Balance training    OT Goals(Current goals can be found in the care plan section) Acute Rehab OT Goals Patient Stated Goal: Figure out how to go back home and have enough assist OT Goal Formulation: With patient Time For Goal Achievement: 06/05/20 Potential to Achieve Goals: Good ADL Goals Pt Will Perform Grooming: with modified independence;standing;sitting Pt Will Perform Lower Body Dressing: with modified independence;sit to/from stand;with adaptive equipment Pt Will Transfer to Toilet: with modified independence;ambulating Additional ADL Goal #1: Pt will complete bed mobility at modified independent level in preparation for ADL. Additional ADL Goal #2: Pt will demonstrate independence with 3 energy conservation strategies during ADL and functional mobility.  OT Frequency: Min 2X/week   Barriers to D/C: Decreased caregiver support  pt's husband recently d/c from hospital, both will need assistance       Co-evaluation              AM-PAC OT "6  Clicks" Daily Activity     Outcome Measure Help from another person eating meals?: A Little Help from another person taking care of personal grooming?: A Little Help from another person toileting, which includes using toliet, bedpan, or urinal?: A Little Help from another person bathing (including washing, rinsing, drying)?: A Little Help  from another person to put on and taking off regular upper body clothing?: A Little Help from another person to put on and taking off regular lower body clothing?: A Little 6 Click Score: 18   End of Session Equipment Utilized During Treatment: Gait belt;Rolling walker;Oxygen Nurse Communication: Mobility status  Activity Tolerance: Patient tolerated treatment well Patient left: in chair;with call bell/phone within reach  OT Visit Diagnosis: Unsteadiness on feet (R26.81);Other abnormalities of gait and mobility (R26.89);Muscle weakness (generalized) (M62.81);Pain Pain - Right/Left: Left Pain - part of body: Knee                Time: 1330-1400 OT Time Calculation (min): 30 min Charges:  OT General Charges $OT Visit: 1 Visit OT Evaluation $OT Eval Moderate Complexity: [place] OTR/L Acute Rehabilitation Services Office: [place] 05/22/2020, 3:24 PM

## 2020-05-22 NOTE — Progress Notes (Signed)
Patient transferred to [phone]. Received report from off going RN. Patient educated & oriented to floor, call bell, etc. Skin assessed, verified by second RN. Initiated second Psychologist, counselling for tele.

## 2020-05-22 NOTE — Progress Notes (Signed)
PROGRESS NOTE    Nicole Norton  TRV:[id number] DOB: 1941/03/11 DOA: 05/19/2020 PCP: Hali Marry, MD      Brief Narrative:  Nicole Norton is a 79 y.o. 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  With EMS, noted to be hypoxic to 70s, febrile, didn't improve in the ER with HFNC.  Admitted to ICU.  CT showed new pneumonia RLL, started on antibiotics.      Assessment & Plan:  Sepsis due to right lower lobe pneumonia -Continue Rocephin and doxycycline, day 4 of 5   Acute on chronic hypoxic respiratory failure due to pneumonia, CHF Acute on chronic diastolic CHF Patient admitted and started on antibiotics, diuretics.  Cardiology were consulted.    Echocardiogram this hospitalization showed recovered EF, consistent with prior Takotsubo.    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  Note recent contrast exposure. -Hold diuretics -Strict I/Os   Prolonged QT interval -Monitor telemetry -Avoid QT prolonging  agents -Monitor mag and K  Demand ischemia Doubt ACS.   -Cardiology recommend outpatient stress testing  Polyneuropathy -Continue Lyrica, lower dose with renal function         Disposition: Status is: Inpatient  Remains inpatient appropriate because:of worsening renal function   Dispo: The patient is from: Home              Anticipated d/c is to: TBD              Anticipated d/c date is: 2 days              Patient currently is not medically stable to d/c.              MDM: The below labs and imaging reports were reviewed and summarized above.  Medication management as above.  This is a severe exacerbation of her chronic disease   DVT prophylaxis: Heparin  Code Status: FULL Family Communication:     Consultants:   Cardiology  PCCM  Procedures:   7/6 echo -- normal EF, normal valves  Antimicrobials:   Azithromycin x1 then doxycycline 7/5>>  Ceftriaxone 7/5 >>  Culture data:   7/5 blood culture x2 -- no growth  7/5 urine culture no growth           Subjective: Patient's breathing is feeling better, her energy level is better.  No confusion, fever overnight.  Still with some cough, feeling of shortness of breath more relative to her baseline.  No confusion, vomiting, diarrhea.  Objective: Vitals:   05/22/20 1530 05/22/20 1600 05/22/20 1700 05/22/20 1800  BP:  (!) 142/87 127/77 (!) 143/85  Pulse:  (!) 58 (!) 58 64  Resp:  18 18 (!) 21  Temp: 98.6 F (  37 C)     TempSrc: Oral     SpO2:  95% 97% 95%  Weight:      Height:        Intake/Output Summary (Last 24 hours) at 05/22/2020 1952 Last data filed at 05/22/2020 1700 Gross per 24 hour  Intake 985.72 ml  Output 1050 ml  Net -64.28 ml   Filed Weights   05/20/20 0323 05/21/20 0500 05/22/20 0500  Weight: 114.3 kg 112.9 kg 112.2 kg    Examination: General appearance: Obese adult female, alert and in no acute distress.  sitting up in recliner HEENT: Anicteric, conjunctiva  pink, lids and lashes normal. No nasal deformity, discharge, epistaxis.  Lips moist, oropharynx moist, no oral lesions.   Skin: Warm and dry.  No jaundice.  No suspicious rashes or lesions. Cardiac: Slow, regular, nl S1-S2, no murmurs appreciated.  Capillary refill is brisk.  JVP not visible.  No pitting LE edema with fluid status difficult to estimate.  Radial pulses 2+ and symmetric. Respiratory: No obvious wheezes, lung sounds diminished throughout due to body habitus, normal respiratory rate Abdomen: Abdomen soft.  No TTP or guarding. No ascites, distension, hepatosplenomegaly.   MSK: No deformities or effusions. Neuro: Awake and alert.  EOMI, moves all extremities. Speech fluent.    Psych: Sensorium intact and responding to questions, attention normal. Affect normal.  Judgment and insight appear normal.    Data Reviewed: I have personally reviewed following labs and imaging studies:  CBC: Recent Labs  Lab 05/19/20 0655 05/19/20 0845 05/19/20 2007 05/21/20 0331 05/22/20 0327  WBC 14.4*  --   --  12.0* 8.9  NEUTROABS 12.2*  --   --   --   --   HGB 12.5 12.2 11.9* 11.6* 11.4*  HCT 39.6 36.0 35.0* 35.4* 36.1  MCV 107.6*  --   --  103.8* 105.9*  PLT 151  --   --  138* 818   Basic Metabolic Panel: Recent Labs  Lab 05/19/20 1725 05/19/20 1725 05/19/20 2007 05/20/20 0357 05/20/20 1638 05/21/20 1135 05/22/20 0327  NA 145   < > 143 143 144 141 143  K 2.9*   < > 3.0* 3.8 4.0 4.3 4.2  CL 104  --   --  103 102 101 101  CO2 25  --   --  27 28 26 31   GLUCOSE 129*  --   --  153* 136* 119* 89  BUN 18  --   --  21 28* 34* 36*  CREATININE 1.59*  --   --  1.68* 1.77* 1.99* 2.20*  CALCIUM 8.7*  --   --  8.4* 8.6* 8.7* 9.1  MG 1.3*  --   --  1.3*  --  2.0 1.9  PHOS  --   --   --  3.5  --   --   --    < > = values in this interval not displayed.   GFR: Estimated Creatinine Clearance: 27.6 mL/min (A) (by C-G formula based on SCr of 2.2 mg/dL (H)). Liver Function Tests: Recent Labs   Lab 05/19/20 0655 05/19/20 1725  AST 52* 44*  ALT 33 33  ALKPHOS 75 74  BILITOT 1.5* 1.5*  PROT 5.6* 5.8*  ALBUMIN 2.9* 3.0*   Recent Labs  Lab 05/19/20 0655  LIPASE 22   Recent Labs  Lab 05/19/20 1103  AMMONIA 18   Coagulation Profile: Recent Labs  Lab 05/20/20 1127  INR 1.3*   Cardiac Enzymes: No results for input(s):  CKTOTAL, CKMB, CKMBINDEX, TROPONINI in the last 168 hours. BNP (last 3 results) No results for input(s): PROBNP in the last 8760 hours. HbA1C: No results for input(s): HGBA1C in the last 72 hours. CBG: Recent Labs  Lab 05/21/20 1532 05/21/20 2022 05/21/20 2317 05/22/20 0315 05/22/20 0833  GLUCAP 123* 123* 97 93 115*   Lipid Profile: No results for input(s): CHOL, HDL, LDLCALC, TRIG, CHOLHDL, LDLDIRECT in the last 72 hours. Thyroid Function Tests: No results for input(s): TSH, T4TOTAL, FREET4, T3FREE, THYROIDAB in the last 72 hours. Anemia Panel: No results for input(s): VITAMINB12, FOLATE, FERRITIN, TIBC, IRON, RETICCTPCT in the last 72 hours. Urine analysis:    Component Value Date/Time   COLORURINE YELLOW 05/19/2020 1524   APPEARANCEUR CLEAR 05/19/2020 1524   LABSPEC >1.046 (H) 05/19/2020 1524   PHURINE 8.0 05/19/2020 1524   GLUCOSEU NEGATIVE 05/19/2020 1524   HGBUR SMALL (A) 05/19/2020 1524   BILIRUBINUR NEGATIVE 05/19/2020 1524   BILIRUBINUR small 09/28/2018 1628   KETONESUR NEGATIVE 05/19/2020 1524   PROTEINUR 100 (A) 05/19/2020 1524   UROBILINOGEN 0.2 09/28/2018 1628   NITRITE NEGATIVE 05/19/2020 1524   LEUKOCYTESUR NEGATIVE 05/19/2020 1524   Sepsis Labs: @LABRCNTIP (procalcitonin:4,lacticacidven:4)  ) Recent Results (from the past 240 hour(s))  SARS Coronavirus 2 by RT PCR (hospital order, performed in [place] hospital lab) Nasopharyngeal Nasopharyngeal Swab     Status: None   Collection Time: 05/19/20 12:19 PM   Specimen: Nasopharyngeal Swab  Result Value Ref Range Status   SARS Coronavirus 2 NEGATIVE NEGATIVE  Final    Comment: (NOTE) SARS-CoV-2 target nucleic acids are NOT DETECTED.  The SARS-CoV-2 RNA is generally detectable in upper and lower respiratory specimens during the acute phase of infection. The lowest concentration of SARS-CoV-2 viral copies this assay can detect is 250 copies / mL. A negative result does not preclude SARS-CoV-2 infection and should not be used as the sole basis for treatment or other patient management decisions.  A negative result may occur with improper specimen collection / handling, submission of specimen other than nasopharyngeal swab, presence of viral mutation(s) within the areas targeted by this assay, and inadequate number of viral copies (<250 copies / mL). A negative result must be combined with clinical observations, patient history, and epidemiological information.  Fact Sheet for Patients:   StrictlyIdeas.no  Fact Sheet for Healthcare Providers: BankingDealers.co.za  This test is not yet approved or  cleared by the [place] FDA and has been authorized for detection and/or diagnosis of SARS-CoV-2 by FDA under an Emergency Use Authorization (EUA).  This EUA will remain in effect (meaning this test can be used) for the duration of the COVID-19 declaration under Section 564(b)(1) of the Act, 21 U.S.C. section 360bbb-3(b)(1), unless the authorization is terminated or revoked sooner.  Performed at [facility], [place] [address] [place], [place] [address]   Culture, blood (Routine X 2) w Reflex to ID Panel     Status: None (Preliminary result)   Collection Time: 05/19/20  2:15 PM   Specimen: BLOOD  Result Value Ref Range Status   Specimen Description BLOOD SITE NOT SPECIFIED  Final   Special Requests   Final    BOTTLES DRAWN AEROBIC AND ANAEROBIC Blood Culture adequate volume   Culture   Final    NO GROWTH 3 DAYS Performed at [facility], [address] [place], [place]  [address]    Report Status PENDING  Incomplete  Culture, blood (Routine X 2) w Reflex to ID  Panel     Status: None (Preliminary result)   Collection Time: 05/19/20  2:15 PM   Specimen: BLOOD  Result Value Ref Range Status   Specimen Description BLOOD SITE NOT SPECIFIED  Final   Special Requests   Final    BOTTLES DRAWN AEROBIC AND ANAEROBIC Blood Culture adequate volume   Culture   Final    NO GROWTH 3 DAYS Performed at [facility], [address] [place], [place] [address]    Report Status PENDING  Incomplete  Urine Culture     Status: None   Collection Time: 05/19/20  3:01 PM   Specimen: Urine, Random  Result Value Ref Range Status   Specimen Description URINE, RANDOM  Final   Special Requests NONE  Final   Culture   Final    NO GROWTH Performed at [facility], [place] [address] [place], [place] [address]    Report Status 05/20/2020 FINAL  Final  MRSA PCR Screening     Status: None   Collection Time: 05/20/20 10:53 PM   Specimen: Nasopharyngeal  Result Value Ref Range Status   MRSA by PCR NEGATIVE NEGATIVE Final    Comment:        The GeneXpert MRSA Assay (FDA approved for NASAL specimens only), is one component of a comprehensive MRSA colonization surveillance program. It is not intended to diagnose MRSA infection nor to guide or monitor treatment for MRSA infections. Performed at [facility], [place] [address], [place],  [address]          Radiology Studies: No results found.      Scheduled Meds: . amiodarone  200 mg Oral Daily  . aspirin EC  81 mg Oral Daily  . Chlorhexidine Gluconate Cloth  6 each Topical Daily  . clopidogrel  75 mg Oral Daily  . doxycycline  100 mg Per Tube Q12H  . guaiFENesin  600 mg Oral BID  . levothyroxine  200 mcg Oral QAC breakfast  . mouth rinse  15 mL Mouth Rinse BID  . metoprolol succinate  25 mg Oral Daily  . pantoprazole  40 mg Oral Daily  . predniSONE  40 mg Oral Daily  . pregabalin  75 mg Oral  BID   Continuous Infusions: . cefTRIAXone (ROCEPHIN)  IV Stopped (05/22/20 1244)  . heparin 1,300 Units/hr (05/22/20 1700)     LOS: 3 days    Time spent: 35 minutes    Edwin Dada, MD [facility] 05/22/2020, 7:52 PM     Please page though [place] or Epic secure chat:  For [employer], Adult nurse

## 2020-05-22 NOTE — Progress Notes (Signed)
Physical Therapy Treatment Patient Details Name: Nicole Norton MRN: [id number] DOB: 1941/03/24 Today's Date: 05/22/2020    History of Present Illness Nicole Norton is a 79 y.o. 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  Reportedly had been having falls at home.  History is somewhat difficult to obtain as the patient is in distress and altered.  Notes associated symptoms of difficulty breathing, chest pain, abdominal pain, lower extremity swelling, nausea, and abdominal pain.     PT Comments    Pt agreeable to do all the activity that her hip/arthritis would allow.  Emphasis on safe transfer technique, standing activity at the sink with stress on time up/endurance and gait with the RW.    Follow Up Recommendations  CIR;Supervision/Assistance - 24 hour     Equipment Recommendations  None recommended by PT;Other (comment)    Recommendations for Other Services       Precautions / Restrictions Precautions Precautions: Fall Restrictions Weight Bearing Restrictions: No    Mobility  Bed Mobility Overal bed mobility: Needs Assistance             General bed mobility comments: pt sitting in recliner upon arrival  Transfers Overall transfer level: Needs assistance Equipment used: Rolling walker (2 wheeled) Transfers: Sit to/from Stand Sit to Stand: Min guard         General transfer comment: vc for safe hand placement;minguard for safety  Ambulation/Gait Ambulation/Gait assistance: Min guard;Min assist Gait Distance (Feet): 70 Feet Assistive device: Rolling walker (2 wheeled) Gait Pattern/deviations: Step-through pattern Gait velocity:  slower Gait velocity interpretation: <1.8 ft/sec, indicate of risk for recurrent falls General Gait Details: generally steady, short step length, noticeably more antalgic L LE as her hip started hurt with time up.   Stairs             Wheelchair Mobility    Modified Rankin (Stroke Patients Only)       Balance Overall balance assessment: Needs assistance Sitting-balance support: No upper extremity supported;Feet supported Sitting balance-Leahy Scale: Good     Standing balance support: No upper extremity supported;During functional activity Standing balance-Leahy Scale: Fair Standing balance comment: able to stand without UE support, did not challenge standing balance                            Cognition Arousal/Alertness: Awake/alert Behavior During Therapy: WFL for tasks assessed/performed Overall Cognitive Status: Within Functional Limits for tasks assessed                                        Exercises      General Comments General comments (skin integrity, edema, etc.): pt on 5lnc upon arrival SpO2 >97%, pt on 4lnc during functional activity SpO2 >93%       Pertinent Vitals/Pain Pain Assessment: Faces Faces Pain Scale: Hurts little more Pain Location: L hip and knee Pain Descriptors / Indicators: Grimacing;Guarding;Sore Pain Intervention(s): Limited activity within patient's tolerance;Monitored during session    [place] expects to be discharged to:: Unsure Living Arrangements: Spouse/significant other Available Help at Discharge: Other (Comment);Available PRN/intermittently (has a housekeeper like  family that can assist some) Type of Home: House Home Access: Stairs to enter   Home Layout: One level Home Equipment: Environmental consultant - 4 wheels;Cane - single point;Shower seat;Grab bars - toilet;Grab bars - tub/shower      Prior Function Level of Independence: Independent with assistive device(s)      Comments: pt  could drive, run some errands.  Could take a shower and do other ADL's.   PT Goals (current goals can now be found in the care plan section) Acute Rehab PT Goals Patient Stated Goal: Figure out how to go back home and have enough assist PT Goal Formulation: With patient Time For Goal Achievement: 06/04/20 Potential to Achieve Goals: Good Progress towards PT goals: Progressing toward goals    Frequency    Min 3X/week      PT Plan Current plan remains appropriate    Co-evaluation PT/OT/SLP Co-Evaluation/Treatment: Yes   PT goals addressed during session: Mobility/safety with mobility        AM-PAC PT "6 Clicks" Mobility   Outcome Measure  Help needed turning from your back to your side while in a flat bed without using bedrails?: A Little Help needed moving from lying on your back to sitting on the side of a flat bed without using bedrails?: A Little Help needed moving to and from a bed to a chair (including a wheelchair)?: A Little Help needed standing up from a chair using your arms (e.g., wheelchair or bedside chair)?: A Little Help needed to walk in hospital room?: A Little Help needed climbing 3-5 steps with a railing? : Total 6 Click Score: 16    End of Session Equipment Utilized During Treatment: Oxygen Activity Tolerance: Patient tolerated treatment well Patient left: with call bell/phone within reach;in chair;with chair alarm set Nurse Communication: Mobility status PT Visit Diagnosis: Unsteadiness on feet (R26.81);Other abnormalities of gait and mobility (R26.89);Muscle weakness (generalized) (M62.81);Difficulty in walking, not elsewhere classified (R26.2)     Time: 1330-1401 PT Time Calculation (min) (ACUTE ONLY): 31 min  Charges:  $Gait Training: 8-22 mins                     05/22/2020  Ginger Carne., PT Acute Rehabilitation Services [phone]  (pager) [phone]  (office)   Tessie Fass Ezra Marquess 05/22/2020, 6:52 PM

## 2020-05-23 LAB — CBC
HCT: 40.5 % (ref 36.0–46.0)
Hemoglobin: 12.8 g/dL (ref 12.0–15.0)
MCH: 33.4 pg (ref 26.0–34.0)
MCHC: 31.6 g/dL (ref 30.0–36.0)
MCV: 105.7 fL — ABNORMAL HIGH (ref 80.0–100.0)
Platelets: 202 10*3/uL (ref 150–400)
RBC: 3.83 MIL/uL — ABNORMAL LOW (ref 3.87–5.11)
RDW: 13.9 % (ref 11.5–15.5)
WBC: 9.2 10*3/uL (ref 4.0–10.5)
nRBC: 0 % (ref 0.0–0.2)

## 2020-05-23 LAB — BASIC METABOLIC PANEL
Anion gap: 11 (ref 5–15)
BUN: 33 mg/dL — ABNORMAL HIGH (ref 8–23)
CO2: 31 mmol/L (ref 22–32)
Calcium: 9.4 mg/dL (ref 8.9–10.3)
Chloride: 100 mmol/L (ref 98–111)
Creatinine, Ser: 1.73 mg/dL — ABNORMAL HIGH (ref 0.44–1.00)
GFR calc Af Amer: 33 mL/min — ABNORMAL LOW (ref >60)
GFR calc non Af Amer: 28 mL/min — ABNORMAL LOW (ref >60)
Glucose, Bld: 90 mg/dL (ref 70–99)
Potassium: 3.7 mmol/L (ref 3.5–5.1)
Sodium: 142 mmol/L (ref 135–145)

## 2020-05-23 LAB — HEPARIN LEVEL (UNFRACTIONATED): Heparin Unfractionated: 0.35 IU/mL (ref 0.30–0.70)

## 2020-05-23 MED ORDER — METOPROLOL SUCCINATE ER 25 MG PO TB24
12.5000 mg | ORAL_TABLET | Freq: Every day | ORAL | Status: DC
  Administered 2020-05-24 – 2020-05-26 (×3): 12.5 mg via ORAL
  Filled 2020-05-23 (×3): qty 1

## 2020-05-23 MED ORDER — POTASSIUM CHLORIDE CRYS ER 20 MEQ PO TBCR
20.0000 meq | EXTENDED_RELEASE_TABLET | Freq: Once | ORAL | Status: AC
  Administered 2020-05-23: 20 meq via ORAL
  Filled 2020-05-23: qty 1

## 2020-05-23 MED ORDER — MAGNESIUM SULFATE IN D5W 1-5 GM/100ML-% IV SOLN
1.0000 g | Freq: Once | INTRAVENOUS | Status: AC
  Administered 2020-05-23: 1 g via INTRAVENOUS
  Filled 2020-05-23: qty 100

## 2020-05-23 NOTE — Progress Notes (Signed)
ANTICOAGULATION CONSULT NOTE - Follow Up Consult  Pharmacy Consult for Heparin Indication: chest pain/ACS and atrial fibrillation  Allergies  Allergen Reactions  . Allopurinol Palpitations  . Uloric [Febuxostat] Other (See Comments)    Episodes of AFIB  . Ace Inhibitors Other (See Comments)    Renal function  . Colchicine Other (See Comments)    palpitations  . Cymbalta [Duloxetine Hcl] Other (See Comments)    palpitations  . Etodolac Other (See Comments)    heart flutters and headaches   . Fluoxetine Other (See Comments)    Jitteriness  . Omeprazole Nausea Only  . Paroxetine Nausea Only    Dizzy  . Solu-Medrol [Methylprednisolone Sodium Succ]     Heart palpitations/bad headache  . Statins Tinitus    REACTION: myalgias REACTION: myalgias  . Lasix [Furosemide] Rash    Does not work very well  . Penicillins Rash    Tolerated Ceftriaxone 4/10-4/11/20 \ Did it involve swelling of the face/tongue/throat, SOB, or low BP? No Did it involve sudden or severe rash/hives, skin peeling, or any reaction on the inside of your mouth or nose? No Did you need to seek medical attention at a hospital or doctor's office? No When did it last happen?35 years ago If all above answers are "NO", may proceed with cephalosporin use.    Patient Measurements: Height: 5\' 6"  (167.6 cm) Weight: 113.2 kg (249 lb 9 oz) IBW/kg (Calculated) : 59.3 Heparin Dosing Weight: 90 kg  Vital Signs: Temp: 97.8 F (36.6 C) (07/09 0800) Temp Source: Oral (07/09 0800) BP: 120/79 (07/09 0800) Pulse Rate: 60 (07/09 0939)  Labs: Recent Labs    05/20/20 1638 05/21/20 0331 05/21/20 0331 05/21/20 1135 05/21/20 2336 05/22/20 0327 05/23/20 0720  HGB  --  11.6*   < >  --   --  11.4* 12.8  HCT  --  35.4*  --   --   --  36.1 40.5  PLT  --  138*  --   --   --  168 202  APTT  --   --   --  49* 68* 75*  --   HEPARINUNFRC  --   --   --  0.30  --  0.37 0.35  CREATININE   < >  --   --  1.99*  --  2.20* 1.73*    < > = values in this interval not displayed.    Estimated Creatinine Clearance: 35.3 mL/min (A) (by C-G formula based on SCr of 1.73 mg/dL (H)).  Assessment:  Nicole Norton with history of atrial fibrillation on Xarelto PTA, last dose unknown.  Admitted with SOB/hypoxia, AoC systolic HF and elevated troponin. Pharmacy is consulted to dose heparin.    Heparin level remains therapeutic (0.35) on 1300 units/hr. CBC stable.   Daily aPTTs stopped on 7/8 since correlating with heparin levels.  Goal of Therapy:  Heparin level 0.3-0.7 units/ml Monitor platelets by anticoagulation protocol: Yes   Plan:   Continue heparin drip at 1300 units/hr   Daily heparin level and CBC.  Xarelto on hold.  Arty Baumgartner, [place] Phone: [phone] 05/23/2020,11:47 AM

## 2020-05-23 NOTE — Progress Notes (Signed)
PROGRESS NOTE        PATIENT DETAILS Name: Nicole Norton Age: 79 y.o. Sex: female Date of Birth: 01/16/1941 Admit Date: 05/19/2020 Admitting Physician Kipp Brood, MD HYI:[id number], Rene Kocher, MD  Brief Narrative: Patient is a 79 y.o. female with history of chronic systolic heart failure, Churg-Strauss syndrome, chronic hypoxic respiratory failure on 2-3 L of oxygen at home, PAF on Xarelto who presented to the ED with shortness of breath-she was found to have severe hypoxemia-felt to be from CAP, decompensated heart failure-she was admitted by the hospitalist service-however post admission-patient became more hypoxic-developed hypotension and acute metabolic encephalopathy-subsequently transferred to the [facility] stability-she was transferred to the hospitalist service.  See below for further details.  Significant events: 7/5>> admit to [facility] for acute on chronic hypoxemic respiratory failure from COPD, decompensated heart failure. 7/5>> worsening hypoxemia/confusion/hypotension-transferred to ICU-started on dobutamine infusion 7/8>> transfer to [facility]  Significant studies: 05/19/2020>> CTA chest: No PE, airspace opacity in the dependent right lower lobe. 05/19/2020>> CT head: No acute intracranial abnormality. 05/19/2020>> chest x-ray: Stable chest exam-no interval change/acute process. 05/20/2020>> TTE: EF 87-86%, grade 2 diastolic dysfunction. 05/14/2019>> TTE: EF 25-30%  Antimicrobial therapy: Rocephin: 7/5>> Doxycycline: 7/6>> Zithromax: 7/5 x 1  Microbiology data: 05/19/2020>> blood culture: No growth 05/19/2020>> urine culture: No growth  Procedures : None  Consults: Cardiology, PCCM  DVT Prophylaxis : SCDs Start: 05/19/20 1722 IV heparin   Subjective: Feels better-down to 3 L of oxygen.  Assessment/Plan: Sepsis secondary to right lower lobe pneumonia: Sepsis physiology has resolved-culture data as above-plan is for 5 days of  Rocephin/doxy.[name]  Hypotension/shock:multifactorial-likely cardiogenic and sepsis factorial. Required dobutamine briefly in the ICU-currently blood pressure stable.  Acute on chronic hypoxic respiratory failure (on home O2 2-3 L at all times): Acute hypoxia felt to be secondary to pneumonia/decompensated heart failure.  Required high flow O2 while in the ICU-overall improved-titrated to her usual 2-3 L of oxygen this morning.  Managed with antibiotics, diuretics.  Has chronic hypoxic respiratory failure secondary to interstitial lung disease/Churg-Strauss syndrome.  AKI on CKD stage IIIb: Multifactorial-likely hemodynamically mediated from hypotension/sepsis and contrast-induced nephropathy.  Improving with supportive care.    Acute metabolic encephalopathy: Secondary to hypoxia/hypotension-resolved.  She is back to baseline.  CT head was negative for acute abnormalities.  Acute on chronic diastolic heart failure: Volume status relatively stable-hypoxia improving-diuretics on hold due to [facility] cardiology recommendations before restarting diuretics.  PAF: In sinus rhythm-rate controlled with amiodarone, metoprolol-currently anticoagulated IV heparin (due to AKI)-on Xarelto as outpatient.  Elevated troponin: Secondary to demand ischemia-cardiology recommending outpatient stress test.  Hypothyroidism: Continue Synthroid  Prolonged QTC: Improving-replete K/MG-repeat EKG in a.m.-continue close monitoring on telemetry-patient on amiodarone.  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  History of eosinophilic granulomatosis with polyangiitis-chronic hypoxic respiratory failure: On home O2 and chronic steroids  Polyneuropathy: Stable-continue Lyrica  Nodular opacity 2.3 x 1.5 cm in the right lung lower lobe: Stable for outpatient follow-up-radiology recommending repeat CT in 3 months to ensure resolution.  Debility/deconditioning: Secondary to acute  illness-evaluated by rehab services-recommendations are for CIR  Morbid Obesity: Estimated body mass index is 40.28 kg/m as calculated from the following:   Height as of this encounter: 5\' 6"  (1.676 m).   Weight as of this encounter: 113.2 kg.    Diet: Diet Order  Diet heart healthy/carb modified Room service appropriate? Yes; Fluid consistency: Thin; Fluid restriction: 1800 mL Fluid  Diet effective now                  Code Status: Full code   Family Communication: None at bedside-we will reach out to family over the next few days.  Disposition Plan: Status is: Inpatient  Remains inpatient appropriate because:Inpatient level of care appropriate due to severity of illness   Dispo: The patient is from: Home              Anticipated d/c is to: CIR              Anticipated d/c date is: 2 days              Patient currently is not medically stable to d/c.   Barriers to Discharge: AKI-acute on chronic hypoxia-cardiogenic shock-sepsis due to pneumonia-on IV antibiotics-improving hypoxia and renal function.  Awaiting CIR evaluation.  Antimicrobial agents: Anti-infectives (From admission, onward)   Start     Dose/Rate Route Frequency Ordered Stop   05/20/20 1315  cefTRIAXone (ROCEPHIN) 2 g in sodium chloride 0.9 % 100 mL IVPB     Discontinue     2 g 200 mL/hr over 30 Minutes Intravenous Every 24 hours 05/19/20 1732     05/20/20 1000  doxycycline (VIBRA-TABS) tablet 100 mg     Discontinue     100 mg Per Tube Every 12 hours 05/20/20 0826     05/19/20 1315  azithromycin (ZITHROMAX) 500 mg in sodium chloride 0.9 % 250 mL IVPB  Status:  Discontinued        500 mg 250 mL/hr over 60 Minutes Intravenous Every 24 hours 05/19/20 1310 05/20/20 0826   05/19/20 1315  cefTRIAXone (ROCEPHIN) 1 g in sodium chloride 0.9 % 100 mL IVPB  Status:  Discontinued        1 g 200 mL/hr over 30 Minutes Intravenous Every 24 hours 05/19/20 1310 05/19/20 1732       Time spent: 25-  minutes-Greater than 50% of this time was spent in counseling, explanation of diagnosis, planning of further management, and coordination of care.  MEDICATIONS: Scheduled Meds: . amiodarone  200 mg Oral Daily  . aspirin EC  81 mg Oral Daily  . Chlorhexidine Gluconate Cloth  6 each Topical Daily  . clopidogrel  75 mg Oral Daily  . doxycycline  100 mg Per Tube Q12H  . guaiFENesin  600 mg Oral BID  . levothyroxine  200 mcg Oral QAC breakfast  . mouth rinse  15 mL Mouth Rinse BID  . [START ON 05/24/2020] metoprolol succinate  12.5 mg Oral Daily  . pantoprazole  40 mg Oral Daily  . predniSONE  40 mg Oral Daily  . pregabalin  75 mg Oral BID   Continuous Infusions: . cefTRIAXone (ROCEPHIN)  IV Stopped (05/22/20 1244)  . heparin 1,300 Units/hr (05/22/20 1700)   PRN Meds:.acetaminophen, diazepam, docusate sodium, ipratropium-albuterol, levalbuterol, ondansetron (ZOFRAN) IV, oxyCODONE, polyethylene glycol   PHYSICAL EXAM: Vital signs: Vitals:   05/22/20 1953 05/23/20 0500 05/23/20 0800 05/23/20 0939  BP: (!) 145/90  120/79   Pulse: (!) 53  (!) 59 60  Resp: 20  (!) 21 14  Temp: 98.6 F (37 C)  97.8 F (36.6 C)   TempSrc: Oral  Oral   SpO2: 99%  98% 98%  Weight:  113.2 kg    Height:       [employer]  05/21/20 0500 05/22/20 0500 05/23/20 0500  Weight: 112.9 kg 112.2 kg 113.2 kg   Body mass index is 40.28 kg/m.   Gen Exam:Alert awake-not in any distress HEENT:atraumatic, normocephalic Chest: B/L clear to auscultation anteriorly CVS:S1S2 regular Abdomen:soft non tender, non distended Extremities:+ edema Neurology: Non focal Skin: no rash  I have personally reviewed following labs and imaging studies  LABORATORY DATA: CBC: Recent Labs  Lab 05/19/20 0655 05/19/20 0655 05/19/20 0845 05/19/20 2007 05/21/20 0331 05/22/20 0327 05/23/20 0720  WBC 14.4*  --   --   --  12.0* 8.9 9.2  NEUTROABS 12.2*  --   --   --   --   --   --   HGB 12.5   < > 12.2 11.9* 11.6* 11.4*  12.8  HCT 39.6   < > 36.0 35.0* 35.4* 36.1 40.5  MCV 107.6*  --   --   --  103.8* 105.9* 105.7*  PLT 151  --   --   --  138* 168 202   < > = values in this interval not displayed.    Basic Metabolic Panel: Recent Labs  Lab 05/19/20 1725 05/19/20 2007 05/20/20 0357 05/20/20 1638 05/21/20 1135 05/22/20 0327 05/23/20 0720  NA 145   < > 143 144 141 143 142  K 2.9*   < > 3.8 4.0 4.3 4.2 3.7  CL 104   < > 103 102 101 101 100  CO2 25   < > 27 28 26 31 31   GLUCOSE 129*   < > 153* 136* 119* 89 90  BUN 18   < > 21 28* 34* 36* 33*  CREATININE 1.59*   < > 1.68* 1.77* 1.99* 2.20* 1.73*  CALCIUM 8.7*   < > 8.4* 8.6* 8.7* 9.1 9.4  MG 1.3*  --  1.3*  --  2.0 1.9  --   PHOS  --   --  3.5  --   --   --   --    < > = values in this interval not displayed.    GFR: Estimated Creatinine Clearance: 35.3 mL/min (A) (by C-G formula based on SCr of 1.73 mg/dL (H)).  Liver Function Tests: Recent Labs  Lab 05/19/20 0655 05/19/20 1725  AST 52* 44*  ALT 33 33  ALKPHOS 75 74  BILITOT 1.5* 1.5*  PROT 5.6* 5.8*  ALBUMIN 2.9* 3.0*   Recent Labs  Lab 05/19/20 0655  LIPASE 22   Recent Labs  Lab 05/19/20 1103  AMMONIA 18    Coagulation Profile: Recent Labs  Lab 05/20/20 1127  INR 1.3*    Cardiac Enzymes: No results for input(s): CKTOTAL, CKMB, CKMBINDEX, TROPONINI in the last 168 hours.  BNP (last 3 results) No results for input(s): PROBNP in the last 8760 hours.  Lipid Profile: No results for input(s): CHOL, HDL, LDLCALC, TRIG, CHOLHDL, LDLDIRECT in the last 72 hours.  Thyroid Function Tests: No results for input(s): TSH, T4TOTAL, FREET4, T3FREE, THYROIDAB in the last 72 hours.  Anemia Panel: No results for input(s): VITAMINB12, FOLATE, FERRITIN, TIBC, IRON, RETICCTPCT in the last 72 hours.  Urine analysis:    Component Value Date/Time   COLORURINE YELLOW 05/19/2020 1524   APPEARANCEUR CLEAR 05/19/2020 1524   LABSPEC >1.046 (H) 05/19/2020 1524   PHURINE 8.0 05/19/2020  1524   GLUCOSEU NEGATIVE 05/19/2020 1524   HGBUR SMALL (A) 05/19/2020 1524   BILIRUBINUR NEGATIVE 05/19/2020 1524   BILIRUBINUR small 09/28/2018 1628   KETONESUR NEGATIVE 05/19/2020 1524  PROTEINUR 100 (A) 05/19/2020 1524   UROBILINOGEN 0.2 09/28/2018 1628   NITRITE NEGATIVE 05/19/2020 1524   LEUKOCYTESUR NEGATIVE 05/19/2020 1524    Sepsis Labs: Lactic Acid, Venous    Component Value Date/Time   LATICACIDVEN 1.9 05/19/2020 1415    MICROBIOLOGY: Recent Results (from the past 240 hour(s))  SARS Coronavirus 2 by RT PCR (hospital order, performed in [facility] hospital lab) Nasopharyngeal Nasopharyngeal Swab     Status: None   Collection Time: 05/19/20 12:19 PM   Specimen: Nasopharyngeal Swab  Result Value Ref Range Status   SARS Coronavirus 2 NEGATIVE NEGATIVE Final    Comment: (NOTE) SARS-CoV-2 target nucleic acids are NOT DETECTED.  The SARS-CoV-2 RNA is generally detectable in upper and lower respiratory specimens during the acute phase of infection. The lowest concentration of SARS-CoV-2 viral copies this assay can detect is 250 copies / mL. A negative result does not preclude SARS-CoV-2 infection and should not be used as the sole basis for treatment or other patient management decisions.  A negative result may occur with improper specimen collection / handling, submission of specimen other than nasopharyngeal swab, presence of viral mutation(s) within the areas targeted by this assay, and inadequate number of viral copies (<250 copies / mL). A negative result must be combined with clinical observations, patient history, and epidemiological information.  Fact Sheet for Patients:   StrictlyIdeas.no  Fact Sheet for Healthcare Providers: BankingDealers.co.za  This test is not yet approved or  cleared by the [place] FDA and has been authorized for detection and/or diagnosis of SARS-CoV-2 by FDA under an Emergency Use  Authorization (EUA).  This EUA will remain in effect (meaning this test can be used) for the duration of the COVID-19 declaration under Section 564(b)(1) of the Act, 21 U.S.C. section 360bbb-3(b)(1), unless the authorization is terminated or revoked sooner.  Performed at [facility], [place] [address], [place], [place] [address]   Culture, blood (Routine X 2) w Reflex to ID Panel     Status: None (Preliminary result)   Collection Time: 05/19/20  2:15 PM   Specimen: BLOOD  Result Value Ref Range Status   Specimen Description BLOOD SITE NOT SPECIFIED  Final   Special Requests   Final    BOTTLES DRAWN AEROBIC AND ANAEROBIC Blood Culture adequate volume   Culture   Final    NO GROWTH 3 DAYS Performed at [facility], [address], [place], [place] [address]    Report Status PENDING  Incomplete  Culture, blood (Routine X 2) w Reflex to ID Panel     Status: None (Preliminary result)   Collection Time: 05/19/20  2:15 PM   Specimen: BLOOD  Result Value Ref Range Status   Specimen Description BLOOD SITE NOT SPECIFIED  Final   Special Requests   Final    BOTTLES DRAWN AEROBIC AND ANAEROBIC Blood Culture adequate volume   Culture   Final    NO GROWTH 3 DAYS Performed at [facility], [address], [place], [place] [address]    Report Status PENDING  Incomplete  Urine Culture     Status: None   Collection Time: 05/19/20  3:01 PM   Specimen: Urine, Random  Result Value Ref Range Status   Specimen Description URINE, RANDOM  Final   Special Requests NONE  Final   Culture   Final    NO GROWTH Performed at [facility], [place] [address], [place], [place] [address]  Report Status 05/20/2020 FINAL  Final  MRSA PCR Screening     Status: None   Collection Time: 05/20/20 10:53 PM   Specimen: Nasopharyngeal  Result Value Ref Range Status   MRSA by PCR NEGATIVE NEGATIVE Final    Comment:        The GeneXpert MRSA Assay (FDA approved for NASAL specimens only), is  one component of a comprehensive MRSA colonization surveillance program. It is not intended to diagnose MRSA infection nor to guide or monitor treatment for MRSA infections. Performed at [facility], [place] [address] [place], [place] [address]     RADIOLOGY STUDIES/RESULTS: No results found.   LOS: 4 days   Oren Binet, MD  [facility]    To contact the attending provider between 7A-7P or the covering provider during after hours 7P-7A, please log into the web site [email or web address] and access using universal [place] password for that web site. If you do not have the password, please call the hospital operator.  05/23/2020, 10:06 AM

## 2020-05-23 NOTE — Progress Notes (Signed)
Physical Therapy Treatment Patient Details Name: Nicole Norton MRN: [id number] DOB: [birth year] Today's Date: 05/23/2020    History of Present Illness Nicole Norton is a 79 y.o. 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  Reportedly had been having falls at home.  History is somewhat difficult to obtain as the patient is in distress and altered.  Notes associated symptoms of difficulty breathing, chest pain, abdominal pain, lower extremity swelling, nausea, and abdominal pain.     PT Comments    Pt OOB in recliner upon arrival of PT, requesting session with focus on seated LE exercises and HEP training rather than ambulation progression due to pain and fatigue from increased mobility yesterday at this time. The pt was educated in a series of LE exercises that the pt can perform 2-3x/day to improve mobility and strength outside of future PT sessions. The pt was agreeable to all exercises, able to complete all without pain, and thankful for the guidance. The pt will continue to benefit from skilled PT to further progress functional strength and independence with mobility to facilitate safe return home after continued therapies.    Follow Up Recommendations  CIR;Supervision/Assistance - 24 hour     Equipment Recommendations   (defer to post acute)    Recommendations for Other Services       Precautions / Restrictions Precautions Precautions: Fall Restrictions Weight Bearing Restrictions: No    Mobility  Bed Mobility Overal bed mobility: Needs Assistance             General bed mobility comments: pt sitting in recliner upon  arrival  Transfers Overall transfer level:  (pt declined today due to reports of pain, completed seated exercise training instead)                     Balance Overall balance assessment: Needs assistance   Sitting balance-Leahy Scale: Good Sitting balance - Comments: pt requires either feet supported without UE support or BUE support without feet supported                                    Cognition Arousal/Alertness: Awake/alert Behavior During Therapy: WFL for tasks assessed/performed Overall Cognitive Status: Within Functional Limits for tasks assessed                                 General Comments: pt pleasant and agreeable, concerned about husband who was also just in hospital      Exercises General Exercises - Lower Extremity Long Arc Quad: Strengthening;Both;10 reps;Seated (pause at top, knee flexion against recliner at bottom to engage HS) Hip ABduction/ADduction: AROM;Both;10 reps;Seated Hip Flexion/Marching: Strengthening;Both;10 reps;Seated (discussed progression with hands placed atop knee to add resistance) Heel Raises: Strengthening;Both;Seated;15 reps (discussed addition of hands on knees to add resistance)    General Comments General comments (skin integrity, edema, etc.): pt on 3L [place] upon arrival, able to maintain SpO2 in 90s through seated exercises      Pertinent Vitals/Pain Pain Assessment: Faces Faces Pain Scale: Hurts little more Pain Location: L hip and knee Pain Descriptors / Indicators: Grimacing;Guarding;Sore  Pain Intervention(s): Limited activity within patient's tolerance;Monitored during session           PT Goals (current goals can now be found in the care plan section) Acute Rehab PT Goals Patient Stated Goal: Figure out how to go back home and have enough assist PT Goal Formulation: With patient Time For Goal Achievement: 06/04/20 Potential to Achieve Goals: Good Progress towards PT goals:  Progressing toward goals    Frequency    Min 3X/week      PT Plan Current plan remains appropriate       AM-PAC PT "6 Clicks" Mobility   Outcome Measure  Help needed turning from your back to your side while in a flat bed without using bedrails?: A Little Help needed moving from lying on your back to sitting on the side of a flat bed without using bedrails?: A Little Help needed moving to and from a bed to a chair (including a wheelchair)?: A Little Help needed standing up from a chair using your arms (e.g., wheelchair or bedside chair)?: A Little Help needed to walk in hospital room?: A Little Help needed climbing 3-5 steps with a railing? : Total 6 Click Score: 16    End of Session Equipment Utilized During Treatment: Oxygen Activity Tolerance: Patient tolerated treatment well Patient left: with call bell/phone within reach;in chair;with chair alarm set Nurse Communication: Mobility status PT Visit Diagnosis: Unsteadiness on feet (R26.81);Other abnormalities of gait and mobility (R26.89);Muscle weakness (generalized) (M62.81);Difficulty in walking, not elsewhere classified (R26.2)     Time: 1420-1436 PT Time Calculation (min) (ACUTE ONLY): 16 min  Charges:  $Therapeutic Exercise: 8-22 mins                     Karma Ganja, PT, DPT   Acute Rehabilitation Department Pager #: [phone]   Otho Bellows 05/23/2020, 2:46 PM

## 2020-05-23 NOTE — TOC Initial Note (Signed)
Transition of Care [facility]) - Initial/Assessment Note    Patient Details  Name: Nicole Norton MRN: [id number] Date of Birth: March 05, 1941  [facility] CM/SW Contact:    Verdell Carmine, RN Phone Number: 05/23/2020, 11:11 AM  Clinical Narrative:                 Patient admitted with Chronic CHF, decompensating encephalopathy , Respiratory Failure, hxChurg-Strauss syndrome chronic O2 user at home, is husbands caregiver at home.  She is more stable, but needs to bridge to home via CIR. Community has taken care of her husband, he is being watched by neighbors, friends, and remote health, home health.   She is appreciative of all the community help, so she can focus on her health status.   Expected Discharge Plan: [place]     Patient Goals and CMS Choice Patient states their goals for this hospitalization and ongoing recovery are:: patient is primary caregiver to husband, may need a bridge to home CIR      Expected Discharge Plan and Services Expected Discharge Plan: [place]       Living arrangements for the past 2 months: Single Family Home                                      Prior Living Arrangements/Services Living arrangements for the past 2 months: Single Family Home Lives with:: Spouse Patient language and need for interpreter reviewed:: Yes        Need for Family Participation in Patient Care: Yes (Comment) Care giver support system in place?: Yes (comment)   Criminal Activity/Legal Involvement Pertinent to Current Situation/Hospitalization: No - Comment as needed  Activities of Daily Living      Permission Sought/Granted                  Emotional Assessment   Attitude/Demeanor/Rapport: Gracious Affect (typically observed): Stable, Pleasant Orientation: : Oriented to Self, Oriented to Place, Oriented to  Time, Oriented to Situation Alcohol / Substance Use: Not Applicable Psych  Involvement: No (comment)  Admission diagnosis:  Community acquired pneumonia [J18.9] Acute respiratory failure with hypoxia (HCC) [J96.01] Acute on chronic respiratory failure with hypoxia (HCC) [J96.21] Community acquired pneumonia, unspecified laterality [J18.9] Patient Active Problem List   Diagnosis Date Noted  . Non-ST elevation (NSTEMI) myocardial infarction ([employer])   . Community acquired pneumonia 05/19/2020  . Acute respiratory failure with hypoxia ([place]) 05/19/2020  . Sepsis ([place]) 05/19/2020  . Elevated troponin 05/19/2020  . Chronic systolic heart failure ([place]) 05/22/2019  . Acute on chronic combined systolic and diastolic CHF (congestive heart failure) ([place])   . AKI (acute kidney injury) ([place])   . Immunosuppressed status ([place])   . Atrial fibrillation with RVR ([place]) 02/22/2019  . Fall at home, initial encounter 02/22/2019  . CKD (chronic kidney disease) stage 3, GFR 30-59 ml/min 02/22/2019  . Chronic respiratory failure with hypoxia ([place]) 02/22/2019  . 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 ([place]) 12/08/2018  . Eosinophil count raised 06/12/2018  . Iron deficiency anemia 11/18/2017  . Chronic anticoagulation 11/18/2017  . Obesity   . Hypercholesterolemia   . Anxiety   . Idiopathic chronic eosinophilic pneumonia 17/61/6073  . Nocturnal hypoxemia 05/05/2017  . Acute on chronic respiratory failure with hypoxia ([place]) 05/05/2017  . Pulmonary nodule 05/05/2017  . Restrictive lung disease 03/10/2017  . Aortic  atherosclerosis ([place]) 03/04/2017  . Cardiac arrhythmia 01/02/2016  . Gout 09/16/2015  . Gastroesophageal reflux disease without esophagitis 02/11/2015  . Family history of diabetes mellitus 03/18/2014  . Urinary frequency 03/18/2014  . Depression with anxiety 03/18/2014  . Obesity, Class II, BMI 35-39.9 12/15/2012  . Family history of breast cancer 11/20/2012  . Depressive disorder, not elsewhere classified 05/20/2011  . DEGENERATIVE  JOINT DISEASE 06/24/2010  . Hyperlipidemia LDL goal <160 03/18/2010  . Hypothyroidism 08/21/2009  . OBESITY, UNSPECIFIED 08/21/2009  . Essential hypertension, benign 12/19/2008  . SHOULDER PAIN, LEFT 12/19/2008   PCP:  Hali Marry, MD Pharmacy:   [place], [place] - [address] [address] Phone: [phone] Fax: [phone]  [facility] [place], [place] [employer] [address] [place] [address] Phone: [phone] Fax: [phone]     Social Determinants of Health (SDOH) Interventions    Readmission Risk Interventions No flowsheet data found.

## 2020-05-24 ENCOUNTER — Other Ambulatory Visit: Payer: Self-pay

## 2020-05-24 LAB — CULTURE, BLOOD (ROUTINE X 2)
Culture: NO GROWTH
Culture: NO GROWTH
Special Requests: ADEQUATE
Special Requests: ADEQUATE

## 2020-05-24 LAB — BASIC METABOLIC PANEL
Anion gap: 8 (ref 5–15)
BUN: 29 mg/dL — ABNORMAL HIGH (ref 8–23)
CO2: 28 mmol/L (ref 22–32)
Calcium: 8.8 mg/dL — ABNORMAL LOW (ref 8.9–10.3)
Chloride: 105 mmol/L (ref 98–111)
Creatinine, Ser: 1.52 mg/dL — ABNORMAL HIGH (ref 0.44–1.00)
GFR calc Af Amer: 38 mL/min — ABNORMAL LOW (ref >60)
GFR calc non Af Amer: 33 mL/min — ABNORMAL LOW (ref >60)
Glucose, Bld: 86 mg/dL (ref 70–99)
Potassium: 4.8 mmol/L (ref 3.5–5.1)
Sodium: 141 mmol/L (ref 135–145)

## 2020-05-24 LAB — CBC
HCT: 35.5 % — ABNORMAL LOW (ref 36.0–46.0)
Hemoglobin: 11.3 g/dL — ABNORMAL LOW (ref 12.0–15.0)
MCH: 33.1 pg (ref 26.0–34.0)
MCHC: 31.8 g/dL (ref 30.0–36.0)
MCV: 104.1 fL — ABNORMAL HIGH (ref 80.0–100.0)
Platelets: 161 10*3/uL (ref 150–400)
RBC: 3.41 MIL/uL — ABNORMAL LOW (ref 3.87–5.11)
RDW: 13.7 % (ref 11.5–15.5)
WBC: 9.7 10*3/uL (ref 4.0–10.5)
nRBC: 0 % (ref 0.0–0.2)

## 2020-05-24 LAB — HEPARIN LEVEL (UNFRACTIONATED): Heparin Unfractionated: 0.42 IU/mL (ref 0.30–0.70)

## 2020-05-24 MED ORDER — BUMETANIDE 1 MG PO TABS
1.0000 mg | ORAL_TABLET | Freq: Every day | ORAL | Status: DC
  Administered 2020-05-24: 1 mg via ORAL
  Filled 2020-05-24 (×2): qty 1

## 2020-05-24 MED ORDER — PREDNISONE 20 MG PO TABS
30.0000 mg | ORAL_TABLET | Freq: Every day | ORAL | Status: DC
  Administered 2020-05-25 – 2020-05-26 (×2): 30 mg via ORAL
  Filled 2020-05-24 (×2): qty 1

## 2020-05-24 MED ORDER — RIVAROXABAN 20 MG PO TABS
20.0000 mg | ORAL_TABLET | Freq: Every day | ORAL | Status: DC
  Administered 2020-05-24 – 2020-05-25 (×2): 20 mg via ORAL
  Filled 2020-05-24 (×2): qty 1

## 2020-05-24 NOTE — Progress Notes (Signed)
Inpatient Rehab Admissions:  Inpatient Rehab Consult received.  I met with pt at the bedside for rehabilitation assessment and to discuss goals and expectations of an inpatient rehab admission.  Pt acknowledged understanding of CIR goals and expectations.  Pt is interested in CIR.  Pt appears to be an appropriate candidate for potential CIR admission. Will continue to monitor pt's progress with acute therapies and medical workup.   Signed: Gayland Curry, [place], [place] Admissions Coordinator [phone]

## 2020-05-24 NOTE — Progress Notes (Signed)
PROGRESS NOTE        PATIENT DETAILS Name: Nicole Norton Age: 79 y.o. Sex: female Date of Birth: 19-Jan-1941 Admit Date: 05/19/2020 Admitting Physician Kipp Brood, MD QBH:[id number], Rene Kocher, MD  Brief Narrative: Patient is a 79 y.o. female with history of chronic systolic heart failure, Churg-Strauss syndrome, chronic hypoxic respiratory failure on 2-3 L of oxygen at home, PAF on Xarelto who presented to the ED with shortness of breath-she was found to have severe hypoxemia-felt to be from CAP, decompensated heart failure-she was admitted by the hospitalist service-however post admission-patient became more hypoxic-developed hypotension and acute metabolic encephalopathy-subsequently transferred to the [facility] stability-she was transferred to the hospitalist service.  See below for further details.  Significant events: 7/5>> admit to [facility] for acute on chronic hypoxemic respiratory failure from COPD, decompensated heart failure. 7/5>> worsening hypoxemia/confusion/hypotension-transferred to ICU-started on dobutamine infusion 7/8>> transfer to [facility]  Significant studies: 05/19/2020>> CTA chest: No PE, airspace opacity in the dependent right lower lobe. 05/19/2020>> CT head: No acute intracranial abnormality. 05/19/2020>> chest x-ray: Stable chest exam-no interval change/acute process. 05/20/2020>> TTE: EF 40-97%, grade 2 diastolic dysfunction. 05/14/2019>> TTE: EF 25-30%  Antimicrobial therapy: Rocephin: 7/5>>7/10 Doxycycline: 7/6>> Zithromax: 7/5 x 1  Microbiology data: 05/19/2020>> blood culture: No growth 05/19/2020>> urine culture: No growth  Procedures : None  Consults: Cardiology, PCCM  DVT Prophylaxis : SCDs Start: 05/19/20 1722 IV heparin   Subjective: Feels better-Down to 2 L of oxygen.  Assessment/Plan: Sepsis secondary to right lower lobe pneumonia: Sepsis physiology has resolved-culture data as above-completed Rocephin course-remains  on Doxy.  Hypotension/shock:multifactorial-likely cardiogenic and sepsis factorial. Required dobutamine briefly in the ICU-currently blood pressure stable.  Acute on chronic hypoxic respiratory failure (on home O2 2-3 L at all times): Acute hypoxia felt to be secondary to pneumonia/decompensated heart failure.  Required high flow O2 while in the ICU-significantly better-titrated down to 2 L of oxygen which is her usual baseline.  She was managed with antibiotics, diuretics and other supportive measures.  Has chronic hypoxemic respiratory failure and is on 2-3 l of home O2.[name]  AKI on CKD stage IIIb: Multifactorial-likely hemodynamically mediated from hypotension/sepsis and contrast-induced nephropathy.  Improving with supportive care.    Acute metabolic encephalopathy: Secondary to hypoxia/hypotension-resolved.  She is back to baseline.  CT head was negative for acute abnormalities.  Acute on chronic diastolic heart failure: Volume status relatively stable-her weight is slowly creeping up-restart Bumex at half the home dose-follow renal function.   PAF: In sinus rhythm-rate controlled with amiodarone, metoprolol-currently on IV heparin-due to AKI-since AKI is improved-have asked pharmacy to transition back to Xarelto.   Elevated troponin: Secondary to demand ischemia-cardiology recommending outpatient stress test.  Hypothyroidism: Continue Synthroid  Prolonged QTC: Resolved-reviewed EKG on 7/10.  Continue telemetry monitoring-patient is on amiodarone.    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  History of eosinophilic granulomatosis with polyangiitis-chronic hypoxic respiratory failure: On home O2 and chronic steroids (20 mg daily).  We will go and start decreasing her steroid dosage-decrease prednisone to 30 mg daily.  Polyneuropathy: Stable-continue Lyrica  Nodular opacity 2.3 x 1.5 cm in the right lung lower lobe: Stable for outpatient follow-up-radiology  recommending repeat CT in 3 months to ensure resolution.  Debility/deconditioning: Secondary to acute illness-evaluated by rehab services-recommendations are for CIR  Morbid Obesity: Estimated body mass index is 40.28 kg/m  as calculated from the following:   Height as of this encounter: 5\' 6"  (1.676 m).   Weight as of this encounter: 113.2 kg.    Diet: Diet Order            Diet heart healthy/carb modified Room service appropriate? Yes; Fluid consistency: Thin; Fluid restriction: 1800 mL Fluid  Diet effective now                  Code Status: Full code   Family Communication: None at bedside-we will reach out to family over the next few days.  Disposition Plan: Status is: Inpatient  Remains inpatient appropriate because:Inpatient level of care appropriate due to severity of illness   Dispo: The patient is from: Home              Anticipated d/c is to: CIR              Anticipated d/c date is: 2 days              Patient currently is not medically stable to d/c.   Barriers to Discharge: AKI-acute on chronic hypoxia-cardiogenic shock-sepsis due to pneumonia-on IV antibiotics-improving hypoxia and renal function.  Awaiting CIR evaluation.  Antimicrobial agents: Anti-infectives (From admission, onward)   Start     Dose/Rate Route Frequency Ordered Stop   05/20/20 1315  cefTRIAXone (ROCEPHIN) 2 g in sodium chloride 0.9 % 100 mL IVPB        2 g 200 mL/hr over 30 Minutes Intravenous Every 24 hours 05/19/20 1732 05/24/20 1352   05/20/20 1000  doxycycline (VIBRA-TABS) tablet 100 mg     Discontinue     100 mg Per Tube Every 12 hours 05/20/20 0826 05/24/20 2359   05/19/20 1315  azithromycin (ZITHROMAX) 500 mg in sodium chloride 0.9 % 250 mL IVPB  Status:  Discontinued        500 mg 250 mL/hr over 60 Minutes Intravenous Every 24 hours 05/19/20 1310 05/20/20 0826   05/19/20 1315  cefTRIAXone (ROCEPHIN) 1 g in sodium chloride 0.9 % 100 mL IVPB  Status:  Discontinued        1  g 200 mL/hr over 30 Minutes Intravenous Every 24 hours 05/19/20 1310 05/19/20 1732       Time spent: 25- minutes-Greater than 50% of this time was spent in counseling, explanation of diagnosis, planning of further management, and coordination of care.  MEDICATIONS: Scheduled Meds: . amiodarone  200 mg Oral Daily  . aspirin EC  81 mg Oral Daily  . bumetanide  1 mg Oral Daily  . Chlorhexidine Gluconate Cloth  6 each Topical Daily  . clopidogrel  75 mg Oral Daily  . doxycycline  100 mg Per Tube Q12H  . guaiFENesin  600 mg Oral BID  . levothyroxine  200 mcg Oral QAC breakfast  . mouth rinse  15 mL Mouth Rinse BID  . metoprolol succinate  12.5 mg Oral Daily  . pantoprazole  40 mg Oral Daily  . predniSONE  40 mg Oral Daily  . pregabalin  75 mg Oral BID   Continuous Infusions: . heparin 1,300 Units/hr (05/24/20 0352)   PRN Meds:.acetaminophen, diazepam, docusate sodium, ipratropium-albuterol, levalbuterol, ondansetron (ZOFRAN) IV, oxyCODONE, polyethylene glycol   PHYSICAL EXAM: Vital signs: Vitals:   05/23/20 1946 05/23/20 2104 05/24/20 0547 05/24/20 0940  BP:  139/75 137/79   Pulse:  62    Resp:  20    Temp:  99 F (37.2 C) 98.3 F (36.8 C)  TempSrc:  Oral Oral   SpO2: 98%   98%  Weight:      Height:       Filed Weights   05/21/20 0500 05/22/20 0500 05/23/20 0500  Weight: 112.9 kg 112.2 kg 113.2 kg   Body mass index is 40.28 kg/m.   Gen Exam:Alert awake-not in any distress HEENT:atraumatic, normocephalic Chest: B/L clear to auscultation anteriorly CVS:S1S2 regular Abdomen:soft non tender, non distended Extremities:+ edema Neurology: Non focal Skin: no rash  I have personally reviewed following labs and imaging studies  LABORATORY DATA: CBC: Recent Labs  Lab 05/19/20 0655 05/19/20 0845 05/19/20 2007 05/21/20 0331 05/22/20 0327 05/23/20 0720 05/24/20 0322  WBC 14.4*  --   --  12.0* 8.9 9.2 9.7  NEUTROABS 12.2*  --   --   --   --   --   --   HGB  12.5   < > 11.9* 11.6* 11.4* 12.8 11.3*  HCT 39.6   < > 35.0* 35.4* 36.1 40.5 35.5*  MCV 107.6*  --   --  103.8* 105.9* 105.7* 104.1*  PLT 151  --   --  138* 168 202 161   < > = values in this interval not displayed.    Basic Metabolic Panel: Recent Labs  Lab 05/19/20 1725 05/19/20 2007 05/20/20 0357 05/20/20 0357 05/20/20 1638 05/21/20 1135 05/22/20 0327 05/23/20 0720 05/24/20 0322  NA 145   < > 143   < > 144 141 143 142 141  K 2.9*   < > 3.8   < > 4.0 4.3 4.2 3.7 4.8  CL 104   < > 103   < > 102 101 101 100 105  CO2 25   < > 27   < > 28 26 31 31 28   GLUCOSE 129*   < > 153*   < > 136* 119* 89 90 86  BUN 18   < > 21   < > 28* 34* 36* 33* 29*  CREATININE 1.59*   < > 1.68*   < > 1.77* 1.99* 2.20* 1.73* 1.52*  CALCIUM 8.7*   < > 8.4*   < > 8.6* 8.7* 9.1 9.4 8.8*  MG 1.3*  --  1.3*  --   --  2.0 1.9  --   --   PHOS  --   --  3.5  --   --   --   --   --   --    < > = values in this interval not displayed.    GFR: Estimated Creatinine Clearance: 40.2 mL/min (A) (by C-G formula based on SCr of 1.52 mg/dL (H)).  Liver Function Tests: Recent Labs  Lab 05/19/20 0655 05/19/20 1725  AST 52* 44*  ALT 33 33  ALKPHOS 75 74  BILITOT 1.5* 1.5*  PROT 5.6* 5.8*  ALBUMIN 2.9* 3.0*   Recent Labs  Lab 05/19/20 0655  LIPASE 22   Recent Labs  Lab 05/19/20 1103  AMMONIA 18    Coagulation Profile: Recent Labs  Lab 05/20/20 1127  INR 1.3*    Cardiac Enzymes: No results for input(s): CKTOTAL, CKMB, CKMBINDEX, TROPONINI in the last 168 hours.  BNP (last 3 results) No results for input(s): PROBNP in the last 8760 hours.  Lipid Profile: No results for input(s): CHOL, HDL, LDLCALC, TRIG, CHOLHDL, LDLDIRECT in the last 72 hours.  Thyroid Function Tests: No results for input(s): TSH, T4TOTAL, FREET4, T3FREE, THYROIDAB in the last 72 hours.  Anemia Panel: No results for  input(s): VITAMINB12, FOLATE, FERRITIN, TIBC, IRON, RETICCTPCT in the last 72 hours.  Urine analysis:     Component Value Date/Time   COLORURINE YELLOW 05/19/2020 1524   APPEARANCEUR CLEAR 05/19/2020 1524   LABSPEC >1.046 (H) 05/19/2020 1524   PHURINE 8.0 05/19/2020 1524   GLUCOSEU NEGATIVE 05/19/2020 1524   HGBUR SMALL (A) 05/19/2020 1524   BILIRUBINUR NEGATIVE 05/19/2020 1524   BILIRUBINUR small 09/28/2018 1628   KETONESUR NEGATIVE 05/19/2020 1524   PROTEINUR 100 (A) 05/19/2020 1524   UROBILINOGEN 0.2 09/28/2018 1628   NITRITE NEGATIVE 05/19/2020 1524   LEUKOCYTESUR NEGATIVE 05/19/2020 1524    Sepsis Labs: Lactic Acid, Venous    Component Value Date/Time   LATICACIDVEN 1.9 05/19/2020 1415    MICROBIOLOGY: Recent Results (from the past 240 hour(s))  SARS Coronavirus 2 by RT PCR (hospital order, performed in [facility] hospital lab) Nasopharyngeal Nasopharyngeal Swab     Status: None   Collection Time: 05/19/20 12:19 PM   Specimen: Nasopharyngeal Swab  Result Value Ref Range Status   SARS Coronavirus 2 NEGATIVE NEGATIVE Final    Comment: (NOTE) SARS-CoV-2 target nucleic acids are NOT DETECTED.  The SARS-CoV-2 RNA is generally detectable in upper and lower respiratory specimens during the acute phase of infection. The lowest concentration of SARS-CoV-2 viral copies this assay can detect is 250 copies / mL. A negative result does not preclude SARS-CoV-2 infection and should not be used as the sole basis for treatment or other patient management decisions.  A negative result may occur with improper specimen collection / handling, submission of specimen other than nasopharyngeal swab, presence of viral mutation(s) within the areas targeted by this assay, and inadequate number of viral copies (<250 copies / mL). A negative result must be combined with clinical observations, patient history, and epidemiological information.  Fact Sheet for Patients:   StrictlyIdeas.no  Fact Sheet for Healthcare  Providers: BankingDealers.co.za  This test is not yet approved or  cleared by the [place] FDA and has been authorized for detection and/or diagnosis of SARS-CoV-2 by FDA under an Emergency Use Authorization (EUA).  This EUA will remain in effect (meaning this test can be used) for the duration of the COVID-19 declaration under Section 564(b)(1) of the Act, 21 U.S.C. section 360bbb-3(b)(1), unless the authorization is terminated or revoked sooner.  Performed at [facility], [place] [address], [place], [place] [address]   Culture, blood (Routine X 2) w Reflex to ID Panel     Status: None (Preliminary result)   Collection Time: 05/19/20  2:15 PM   Specimen: BLOOD  Result Value Ref Range Status   Specimen Description BLOOD SITE NOT SPECIFIED  Final   Special Requests   Final    BOTTLES DRAWN AEROBIC AND ANAEROBIC Blood Culture adequate volume   Culture   Final    NO GROWTH 4 DAYS Performed at [facility], [address] [place], [place] [address]    Report Status PENDING  Incomplete  Culture, blood (Routine X 2) w Reflex to ID Panel     Status: None (Preliminary result)   Collection Time: 05/19/20  2:15 PM   Specimen: BLOOD  Result Value Ref Range Status   Specimen Description BLOOD SITE NOT SPECIFIED  Final   Special Requests   Final    BOTTLES DRAWN AEROBIC AND ANAEROBIC Blood Culture adequate volume   Culture   Final    NO GROWTH 4 DAYS Performed at [facility], [address]  [place], [place] [address]    Report Status PENDING  Incomplete  Urine Culture     Status: None   Collection Time: 05/19/20  3:01 PM   Specimen: Urine, Random  Result Value Ref Range Status   Specimen Description URINE, RANDOM  Final   Special Requests NONE  Final   Culture   Final    NO GROWTH Performed at [facility], [address], [place], [place] [address]    Report Status 05/20/2020 FINAL  Final  MRSA PCR Screening     Status: None    Collection Time: 05/20/20 10:53 PM   Specimen: Nasopharyngeal  Result Value Ref Range Status   MRSA by PCR NEGATIVE NEGATIVE Final    Comment:        The GeneXpert MRSA Assay (FDA approved for NASAL specimens only), is one component of a comprehensive MRSA colonization surveillance program. It is not intended to diagnose MRSA infection nor to guide or monitor treatment for MRSA infections. Performed at [facility], [place] [address] [place], [place] [address]     RADIOLOGY STUDIES/RESULTS: No results found.   LOS: 5 days   Oren Binet, MD  [facility]    To contact the attending provider between 7A-7P or the covering provider during after hours 7P-7A, please log into the web site [email or web address] and access using universal [place] password for that web site. If you do not have the password, please call the hospital operator.  05/24/2020, 1:55 PM
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
## 2020-05-24 NOTE — Progress Notes (Signed)
Progress Note  Patient Name: Nicole Norton Date of Encounter: 05/24/2020  [facility] Cardiologist: Quay Burow, MD   Subjective   Currently feeling well.  Ready to return home.  Understands that she Denali Sharma need inpatient rehab.  She is motivated at this point.  Ready to get off of IV medications.  Inpatient Medications    Scheduled Meds: . amiodarone  200 mg Oral Daily  . aspirin EC  81 mg Oral Daily  . Chlorhexidine Gluconate Cloth  6 each Topical Daily  . clopidogrel  75 mg Oral Daily  . doxycycline  100 mg Per Tube Q12H  . guaiFENesin  600 mg Oral BID  . levothyroxine  200 mcg Oral QAC breakfast  . mouth rinse  15 mL Mouth Rinse BID  . metoprolol succinate  12.5 mg Oral Daily  . pantoprazole  40 mg Oral Daily  . predniSONE  40 mg Oral Daily  . pregabalin  75 mg Oral BID   Continuous Infusions: . cefTRIAXone (ROCEPHIN)  IV 2 g (05/23/20 1247)  . heparin 1,300 Units/hr (05/24/20 0352)   PRN Meds: acetaminophen, diazepam, docusate sodium, ipratropium-albuterol, levalbuterol, ondansetron (ZOFRAN) IV, oxyCODONE, polyethylene glycol   Vital Signs    Vitals:   05/23/20 1946 05/23/20 2104 05/24/20 0547 05/24/20 0940  BP:  139/75 137/79   Pulse:  62    Resp:  20    Temp:  99 F (37.2 C) 98.3 F (36.8 C)   TempSrc:  Oral Oral   SpO2: 98%   98%  Weight:      Height:       No intake or output data in the 24 hours ending 05/24/20 1105 Last 3 Weights 05/23/2020 05/22/2020 05/21/2020  Weight (lbs) 249 lb 9 oz 247 lb 5.7 oz 248 lb 14.4 oz  Weight (kg) 113.2 kg 112.2 kg 112.9 kg      Telemetry    Sinus rhythm- Personally Reviewed  ECG    Sinus rhythm, rate 62, PACs personally reviewed Physical Exam   GEN: Well nourished, well developed, in no acute distress  HEENT: normal  Neck: no JVD, carotid bruits, or masses Cardiac: RRR; no murmurs, rubs, or gallops,no edema  Respiratory:  clear to auscultation bilaterally, normal work of breathing GI: soft,  nontender, nondistended, + BS MS: no deformity or atrophy  Skin: warm and dry Neuro:  Strength and sensation are intact Psych: euthymic mood, full affect   Labs    High Sensitivity Troponin:   Recent Labs  Lab 05/19/20 1527 05/19/20 1650  TROPONINIHS 1,137* 1,836*      Chemistry Recent Labs  Lab 05/19/20 0655 05/19/20 0845 05/19/20 1725 05/19/20 2007 05/22/20 0327 05/23/20 0720 05/24/20 0322  NA 143   < > 145   < > 143 142 141  K 3.5   < > 2.9*   < > 4.2 3.7 4.8  CL 105   < > 104   < > 101 100 105  CO2 26   < > 25   < > 31 31 28   GLUCOSE 101*   < > 129*   < > 89 90 86  BUN 14   < > 18   < > 36* 33* 29*  CREATININE 1.36*   < > 1.59*   < > 2.20* 1.73* 1.52*  CALCIUM 8.5*   < > 8.7*   < > 9.1 9.4 8.8*  PROT 5.6*  --  5.8*  --   --   --   --  ALBUMIN 2.9*  --  3.0*  --   --   --   --   AST 52*  --  44*  --   --   --   --   ALT 33  --  33  --   --   --   --   ALKPHOS 75  --  74  --   --   --   --   BILITOT 1.5*  --  1.5*  --   --   --   --   GFRNONAA 38*   < > 31*   < > 21* 28* 33*  GFRAA 44*   < > 36*   < > 24* 33* 38*  ANIONGAP 12   < > 16*   < > 11 11 8    < > = values in this interval not displayed.     Hematology Recent Labs  Lab 05/22/20 0327 05/23/20 0720 05/24/20 0322  WBC 8.9 9.2 9.7  RBC 3.41* 3.83* 3.41*  HGB 11.4* 12.8 11.3*  HCT 36.1 40.5 35.5*  MCV 105.9* 105.7* 104.1*  MCH 33.4 33.4 33.1  MCHC 31.6 31.6 31.8  RDW 14.1 13.9 13.7  PLT 168 202 161    BNP Recent Labs  Lab 05/19/20 0655 05/19/20 1727  BNP 953.0* 4,213.3*     DDimer No results for input(s): DDIMER in the last 168 hours.   Radiology    No results found.  Cardiac Studies   Echo as above. On my review there is regional hypokinesis in the inferior/inferoseptal walls.  Patient Profile     79 y.o. 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 NSTEMI:Likely due to demand ischemia but coronary artery disease cannot be fully excluded.  Anslee Micheletti need an outpatient stress test.  Had a normal perfusion scan in 2005.  2. CHF: ejection fraction has improved. 3. PAfib:Has required cardioversion in the past.  CHA2DS2-VASc of 5-6.  Carlon Chaloux need restarting of Xarelto as her creatinine has improved.  Currently in sinus rhythm.   4. Amiodarone:May need to stop amiodarone due to chronic lung issues.  We Debbora Ang continue with current monitoring.  AST elevation could be due to MI.  5. Long QT: Avoiding QT prolonging drugs. 6. AKI: Creatinine has been improving.  We Misao Fackrell restart Xarelto and have her off of heparin as soon as possible. 7. Ac on chronic respiratory failure: Likely multifactorial with pneumonia on top of chronic lung disease.  Improving. 8. PAH:l likely due to chronic lung disease.  9. Aortic atherosclerosis:Noted on CT.  Needs lipid-lowering medications with LDL goal less than 70. 10. HLP:  Statin intolerant.  Jeilyn Reznik need lipid clinic at discharge.         For questions or updates, please contact [place] Please consult [email or web address] for contact info under        Signed, Ishitha Roper Meredith Leeds, MD  05/24/2020, 11:05 AM

## 2020-05-24 NOTE — Progress Notes (Signed)
ANTICOAGULATION CONSULT NOTE - Follow Up Consult  Pharmacy Consult for heparin Indication: chest pain/ACS  Allergies  Allergen Reactions  . Allopurinol Palpitations  . Uloric [Febuxostat] Other (See Comments)    Episodes of AFIB  . Ace Inhibitors Other (See Comments)    Renal function  . Colchicine Other (See Comments)    palpitations  . Cymbalta [Duloxetine Hcl] Other (See Comments)    palpitations  . Etodolac Other (See Comments)    heart flutters and headaches   . Fluoxetine Other (See Comments)    Jitteriness  . Omeprazole Nausea Only  . Paroxetine Nausea Only    Dizzy  . Solu-Medrol [Methylprednisolone Sodium Succ]     Heart palpitations/bad headache  . Statins Tinitus    REACTION: myalgias REACTION: myalgias  . Lasix [Furosemide] Rash    Does not work very well  . Penicillins Rash    Tolerated Ceftriaxone 4/10-4/11/20 \ Did it involve swelling of the face/tongue/throat, SOB, or low BP? No Did it involve sudden or severe rash/hives, skin peeling, or any reaction on the inside of your mouth or nose? No Did you need to seek medical attention at a hospital or doctor's office? No When did it last happen?35 years ago If all above answers are "NO", may proceed with cephalosporin use.    Patient Measurements: Height: 5\' 6"  (167.6 cm) Weight: 113.2 kg (249 lb 9 oz) IBW/kg (Calculated) : 59.3 Heparin Dosing Weight: 90kg   Vital Signs: Temp: 98.3 F (36.8 C) (07/10 0547) Temp Source: Oral (07/10 0547) BP: 137/79 (07/10 0547)  Labs: Recent Labs    05/21/20 1135 05/21/20 1135 05/21/20 2336 05/22/20 0327 05/22/20 0327 05/23/20 0720 05/24/20 0322  HGB  --   --   --  11.4*   < > 12.8 11.3*  HCT  --   --   --  36.1  --  40.5 35.5*  PLT  --   --   --  168  --  202 161  APTT 49*  --  68* 75*  --   --   --   HEPARINUNFRC 0.30   < >  --  0.37  --  0.35 0.42  CREATININE 1.99*   < >  --  2.20*  --  1.73* 1.52*   < > = values in this interval not displayed.     Estimated Creatinine Clearance: 40.2 mL/min (A) (by C-G formula based on SCr of 1.52 mg/dL (H)).   Assessment: 79 year old female with history of atrial fibrillation on Xarelto prior to admission with unknown last dose.   Heparin level remains therapeutic at 0.42 on 1300 units/hr. Hgb 11.3; HCT 35.5.   Goal of Therapy:  Heparin level 0.3-0.7 units/ml Monitor platelets by anticoagulation protocol: Yes   Plan:  Continue heparin infusion at 1300 units/hr  Continue with daily heparin level and CBC Continue to hold Xarelto   Wilson Singer, PharmD PGY1 Pharmacy Resident 05/24/2020 10:36 AM

## 2020-05-25 ENCOUNTER — Encounter (HOSPITAL_COMMUNITY): Payer: Self-pay | Admitting: Pulmonary Disease

## 2020-05-25 LAB — BASIC METABOLIC PANEL
Anion gap: 10 (ref 5–15)
BUN: 27 mg/dL — ABNORMAL HIGH (ref 8–23)
CO2: 30 mmol/L (ref 22–32)
Calcium: 9.4 mg/dL (ref 8.9–10.3)
Chloride: 101 mmol/L (ref 98–111)
Creatinine, Ser: 1.43 mg/dL — ABNORMAL HIGH (ref 0.44–1.00)
GFR calc Af Amer: 41 mL/min — ABNORMAL LOW (ref >60)
GFR calc non Af Amer: 35 mL/min — ABNORMAL LOW (ref >60)
Glucose, Bld: 85 mg/dL (ref 70–99)
Potassium: 4.4 mmol/L (ref 3.5–5.1)
Sodium: 141 mmol/L (ref 135–145)

## 2020-05-25 MED ORDER — BUMETANIDE 2 MG PO TABS
2.0000 mg | ORAL_TABLET | Freq: Every day | ORAL | Status: DC
  Administered 2020-05-25 – 2020-05-26 (×2): 2 mg via ORAL
  Filled 2020-05-25 (×2): qty 1

## 2020-05-25 NOTE — Progress Notes (Signed)
Inpatient Rehab Admissions Coordinator:  Pt gave [facility] permission to call neighbor, Vaughan Basta, and housekeeper, Elmo Putt, who will be assisting once discharged from hospital.  Explained CIR goals and expectations to [place]. Both acknowledged understanding.  Both indicated that they would be able to assist pt once discharged from hospital.  Gayland Curry, [place] [place] Admissions Coordinator [phone]

## 2020-05-25 NOTE — Progress Notes (Signed)
   05/24/20 2034  Assess: MEWS Score  Temp 97.8 F (36.6 C)  BP (!) 155/82  Pulse Rate (!) 59  ECG Heart Rate (!) 59  Resp 15  Level of Consciousness Alert  SpO2 97 %  O2 Device Nasal Cannula  Patient Activity (if Appropriate) In bed  O2 Flow Rate (L/min) 2 L/min  Assess: MEWS Score  MEWS Temp 0  MEWS Systolic 0  MEWS Pulse 0  MEWS RR 0  MEWS LOC 0  MEWS Score 0  MEWS Score Color Green  Assess: if the MEWS score is Yellow or Red  Were vital signs taken at a resting state? Yes  Focused Assessment Documented focused assessment  Early Detection of Sepsis Score *See Row Information* Low  MEWS guidelines implemented *See Row Information* No, other (Comment) (no acute changes)  Document  Progress note created (see row info) Yes

## 2020-05-25 NOTE — Plan of Care (Signed)

## 2020-05-25 NOTE — Progress Notes (Addendum)
PROGRESS NOTE        PATIENT DETAILS Name: Nicole Norton Age: 79 y.o. Sex: female Date of Birth: 1941/10/28 Admit Date: 05/19/2020 Admitting Physician Kipp Brood, MD TIW:[id number], Rene Kocher, MD  Brief Narrative: Patient is a 79 y.o. female with history of chronic systolic heart failure, Churg-Strauss syndrome, chronic hypoxic respiratory failure on 2-3 L of oxygen at home, PAF on Xarelto who presented to the ED with shortness of breath-she was found to have severe hypoxemia-felt to be from CAP, decompensated heart failure-she was admitted by the hospitalist service-however post admission-patient became more hypoxic-developed hypotension and acute metabolic encephalopathy-subsequently transferred to the [place] stability-she was transferred to the hospitalist service.  See below for further details.  Significant events: 7/5>> admit to [facility] for acute on chronic hypoxemic respiratory failure from COPD, decompensated heart failure. 7/5>> worsening hypoxemia/confusion/hypotension-transferred to ICU-started on dobutamine infusion 7/8>> transfer to [facility]  Significant studies: 05/19/2020>> CTA chest: No PE, airspace opacity in the dependent right lower lobe. 05/19/2020>> CT head: No acute intracranial abnormality. 05/19/2020>> chest x-ray: Stable chest exam-no interval change/acute process. 05/20/2020>> TTE: EF 82-50%, grade 2 diastolic dysfunction. 05/14/2019>> TTE: EF 25-30%  Antimicrobial therapy: Rocephin: 7/5>>7/10 Doxycycline: 7/6>>7/10 Zithromax: 7/5 x 1  Microbiology data: 05/19/2020>> blood culture: No growth 05/19/2020>> urine culture: No growth  Procedures : None  Consults: Cardiology, PCCM  DVT Prophylaxis : SCDs Start: 05/19/20 1722 IV heparin  Subjective: Feels great-wants to have a shower-no major issues overnight.  Assessment/Plan: Sepsis secondary to right lower lobe pneumonia: Sepsis physiology has resolved-culture data as  above-completed a course of Rocephin/doxycycline.   Hypotension/shock:multifactorial-likely cardiogenic and sepsis. Required dobutamine briefly in the ICU-currently blood pressure stable.  Acute on chronic hypoxic respiratory failure (on home O2 2-3 L at all times): Acute hypoxia felt to be secondary to pneumonia/decompensated heart failure.  Required high flow O2 while in the ICU-significantly better-titrated down to 2 L of oxygen which is her usual baseline.  She was managed with antibiotics, diuretics and other supportive measures.  Has chronic hypoxemic respiratory failure and is on 2-3 l of home O2.[name]  AKI on CKD stage IIIb: Multifactorial-likely hemodynamically mediated from hypotension/sepsis and contrast-induced nephropathy.  Improving with supportive care.    Acute metabolic encephalopathy: Secondary to hypoxia/hypotension-resolved.  She is back to baseline.  CT head was negative for acute abnormalities.  Acute on chronic diastolic heart failure: Volume status relatively stable-have resumed her usual dosing of Bumex now that her creatinine is stable.  PAF: In sinus rhythm-rate controlled with amiodarone, metoprolol-back on Xarelto-briefly on IV heparin due to AKI.   Elevated troponin: Secondary to demand ischemia-cardiology recommending outpatient stress test.  On Plavix-Per cardiology note on 7/8-low risk of bleeding with Xarelto than ASA.  Hypothyroidism: Continue Synthroid  Prolonged QTC: Resolved-reviewed EKG on 7/10.  Continue telemetry monitoring-patient is on amiodarone.    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  History of eosinophilic granulomatosis with polyangiitis-chronic hypoxic respiratory failure: On home O2 and chronic steroids (20 mg daily).  Continue to taper prednisone-plan to taper back to home dose of 20 mg daily on 7/12.    Polyneuropathy: Stable-continue Lyrica  Nodular opacity 2.3 x 1.5 cm in the right lung lower lobe: Stable  for outpatient follow-up-radiology recommending repeat CT in 3 months to ensure resolution.  Debility/deconditioning: Secondary to acute illness-evaluated by rehab services-recommendations are for CIR  Morbid Obesity: Estimated body mass index is 40.24 kg/m as calculated from the following:   Height as of this encounter: 5\' 6"  (1.676 m).   Weight as of this encounter: 113.1 kg.    Diet: Diet Order            Diet heart healthy/carb modified Room service appropriate? Yes; Fluid consistency: Thin; Fluid restriction: 1800 mL Fluid  Diet effective now                  Code Status: Full code   Family Communication: None at bedside-we will reach out to family over the next few days.  Disposition Plan: Status is: Inpatient  Remains inpatient appropriate because:Inpatient level of care appropriate due to severity of illness   Dispo: The patient is from: Home              Anticipated d/c is to: CIR              Anticipated d/c date is: 1 days              Patient currently is medically stable to d/c.   Barriers to Discharge: Stable to discharge to CIR when bed available.  Antimicrobial agents: Anti-infectives (From admission, onward)   Start     Dose/Rate Route Frequency Ordered Stop   05/20/20 1315  cefTRIAXone (ROCEPHIN) 2 g in sodium chloride 0.9 % 100 mL IVPB        2 g 200 mL/hr over 30 Minutes Intravenous Every 24 hours 05/19/20 1732 05/24/20 1352   05/20/20 1000  doxycycline (VIBRA-TABS) tablet 100 mg        100 mg Per Tube Every 12 hours 05/20/20 0826 05/24/20 2201   05/19/20 1315  azithromycin (ZITHROMAX) 500 mg in sodium chloride 0.9 % 250 mL IVPB  Status:  Discontinued        500 mg 250 mL/hr over 60 Minutes Intravenous Every 24 hours 05/19/20 1310 05/20/20 0826   05/19/20 1315  cefTRIAXone (ROCEPHIN) 1 g in sodium chloride 0.9 % 100 mL IVPB  Status:  Discontinued        1 g 200 mL/hr over 30 Minutes Intravenous Every 24 hours 05/19/20 1310 05/19/20 1732        Time spent: 25- minutes-Greater than 50% of this time was spent in counseling, explanation of diagnosis, planning of further management, and coordination of care.  MEDICATIONS: Scheduled Meds: . amiodarone  200 mg Oral Daily  . aspirin EC  81 mg Oral Daily  . bumetanide  2 mg Oral Daily  . Chlorhexidine Gluconate Cloth  6 each Topical Daily  . clopidogrel  75 mg Oral Daily  . guaiFENesin  600 mg Oral BID  . levothyroxine  200 mcg Oral QAC breakfast  . mouth rinse  15 mL Mouth Rinse BID  . metoprolol succinate  12.5 mg Oral Daily  . pantoprazole  40 mg Oral Daily  . predniSONE  30 mg Oral Daily  . pregabalin  75 mg Oral BID  . rivaroxaban  20 mg Oral Q supper   Continuous Infusions:  PRN Meds:.acetaminophen, diazepam, docusate sodium, ipratropium-albuterol, levalbuterol, ondansetron (ZOFRAN) IV, oxyCODONE, polyethylene glycol   PHYSICAL EXAM: Vital signs: Vitals:   05/24/20 1804 05/24/20 2034 05/24/20 2117 05/25/20 0520  BP: 130/79 (!) 155/82 137/85 135/75  Pulse: (!) 57 (!) 59 (!) 55 (!) 56  Resp: 17 15 16 17   Temp: 98.1 F (36.7 C) 97.8 F (36.6 C) 97.7 F (36.5 C) 98 F (36.7  C)  TempSrc: Oral Oral Oral Oral  SpO2: 98% 97% 99% 100%  Weight:    113.1 kg  Height:       Filed Weights   05/22/20 0500 05/23/20 0500 05/25/20 0520  Weight: 112.2 kg 113.2 kg 113.1 kg   Body mass index is 40.24 kg/m.   Gen Exam:Alert awake-not in any distress HEENT:atraumatic, normocephalic Chest: B/L clear to auscultation anteriorly CVS:S1S2 regular Abdomen:soft non tender, non distended Extremities: Trace edema Neurology: Non focal Skin: no rash  I have personally reviewed following labs and imaging studies  LABORATORY DATA: CBC: Recent Labs  Lab 05/19/20 0655 05/19/20 0845 05/19/20 2007 05/21/20 0331 05/22/20 0327 05/23/20 0720 05/24/20 0322  WBC 14.4*  --   --  12.0* 8.9 9.2 9.7  NEUTROABS 12.2*  --   --   --   --   --   --   HGB 12.5   < > 11.9* 11.6*  11.4* 12.8 11.3*  HCT 39.6   < > 35.0* 35.4* 36.1 40.5 35.5*  MCV 107.6*  --   --  103.8* 105.9* 105.7* 104.1*  PLT 151  --   --  138* 168 202 161   < > = values in this interval not displayed.    Basic Metabolic Panel: Recent Labs  Lab 05/19/20 1725 05/19/20 2007 05/20/20 0357 05/20/20 1638 05/21/20 1135 05/22/20 0327 05/23/20 0720 05/24/20 0322 05/25/20 0721  NA 145   < > 143   < > 141 143 142 141 141  K 2.9*   < > 3.8   < > 4.3 4.2 3.7 4.8 4.4  CL 104   < > 103   < > 101 101 100 105 101  CO2 25   < > 27   < > 26 31 31 28 30   GLUCOSE 129*   < > 153*   < > 119* 89 90 86 85  BUN 18   < > 21   < > 34* 36* 33* 29* 27*  CREATININE 1.59*   < > 1.68*   < > 1.99* 2.20* 1.73* 1.52* 1.43*  CALCIUM 8.7*   < > 8.4*   < > 8.7* 9.1 9.4 8.8* 9.4  MG 1.3*  --  1.3*  --  2.0 1.9  --   --   --   PHOS  --   --  3.5  --   --   --   --   --   --    < > = values in this interval not displayed.    GFR: Estimated Creatinine Clearance: 42.7 mL/min (A) (by C-G formula based on SCr of 1.43 mg/dL (H)).  Liver Function Tests: Recent Labs  Lab 05/19/20 0655 05/19/20 1725  AST 52* 44*  ALT 33 33  ALKPHOS 75 74  BILITOT 1.5* 1.5*  PROT 5.6* 5.8*  ALBUMIN 2.9* 3.0*   Recent Labs  Lab 05/19/20 0655  LIPASE 22   Recent Labs  Lab 05/19/20 1103  AMMONIA 18    Coagulation Profile: Recent Labs  Lab 05/20/20 1127  INR 1.3*    Cardiac Enzymes: No results for input(s): CKTOTAL, CKMB, CKMBINDEX, TROPONINI in the last 168 hours.  BNP (last 3 results) No results for input(s): PROBNP in the last 8760 hours.  Lipid Profile: No results for input(s): CHOL, HDL, LDLCALC, TRIG, CHOLHDL, LDLDIRECT in the last 72 hours.  Thyroid Function Tests: No results for input(s): TSH, T4TOTAL, FREET4, T3FREE, THYROIDAB in the last 72 hours.  Anemia  Panel: No results for input(s): VITAMINB12, FOLATE, FERRITIN, TIBC, IRON, RETICCTPCT in the last 72 hours.  Urine analysis:    Component Value  Date/Time   COLORURINE YELLOW 05/19/2020 1524   APPEARANCEUR CLEAR 05/19/2020 1524   LABSPEC >1.046 (H) 05/19/2020 1524   PHURINE 8.0 05/19/2020 1524   GLUCOSEU NEGATIVE 05/19/2020 1524   HGBUR SMALL (A) 05/19/2020 1524   BILIRUBINUR NEGATIVE 05/19/2020 1524   BILIRUBINUR small 09/28/2018 1628   KETONESUR NEGATIVE 05/19/2020 1524   PROTEINUR 100 (A) 05/19/2020 1524   UROBILINOGEN 0.2 09/28/2018 1628   NITRITE NEGATIVE 05/19/2020 1524   LEUKOCYTESUR NEGATIVE 05/19/2020 1524    Sepsis Labs: Lactic Acid, Venous    Component Value Date/Time   LATICACIDVEN 1.9 05/19/2020 1415    MICROBIOLOGY: Recent Results (from the past 240 hour(s))  SARS Coronavirus 2 by RT PCR (hospital order, performed in [facility] hospital lab) Nasopharyngeal Nasopharyngeal Swab     Status: None   Collection Time: 05/19/20 12:19 PM   Specimen: Nasopharyngeal Swab  Result Value Ref Range Status   SARS Coronavirus 2 NEGATIVE NEGATIVE Final    Comment: (NOTE) SARS-CoV-2 target nucleic acids are NOT DETECTED.  The SARS-CoV-2 RNA is generally detectable in upper and lower respiratory specimens during the acute phase of infection. The lowest concentration of SARS-CoV-2 viral copies this assay can detect is 250 copies / mL. A negative result does not preclude SARS-CoV-2 infection and should not be used as the sole basis for treatment or other patient management decisions.  A negative result may occur with improper specimen collection / handling, submission of specimen other than nasopharyngeal swab, presence of viral mutation(s) within the areas targeted by this assay, and inadequate number of viral copies (<250 copies / mL). A negative result must be combined with clinical observations, patient history, and epidemiological information.  Fact Sheet for Patients:   StrictlyIdeas.no  Fact Sheet for Healthcare Providers: BankingDealers.co.za  This test is not  yet approved or  cleared by the [place] FDA and has been authorized for detection and/or diagnosis of SARS-CoV-2 by FDA under an Emergency Use Authorization (EUA).  This EUA will remain in effect (meaning this test can be used) for the duration of the COVID-19 declaration under Section 564(b)(1) of the Act, 21 U.S.C. section 360bbb-3(b)(1), unless the authorization is terminated or revoked sooner.  Performed at [facility], [place] [address] [place], [place] [address]   Culture, blood (Routine X 2) w Reflex to ID Panel     Status: None   Collection Time: 05/19/20  2:15 PM   Specimen: BLOOD  Result Value Ref Range Status   Specimen Description BLOOD SITE NOT SPECIFIED  Final   Special Requests   Final    BOTTLES DRAWN AEROBIC AND ANAEROBIC Blood Culture adequate volume   Culture   Final    NO GROWTH 5 DAYS Performed at [facility], [address], [place], [place] [address]    Report Status 05/24/2020 FINAL  Final  Culture, blood (Routine X 2) w Reflex to ID Panel     Status: None   Collection Time: 05/19/20  2:15 PM   Specimen: BLOOD  Result Value Ref Range Status   Specimen Description BLOOD SITE NOT SPECIFIED  Final   Special Requests   Final    BOTTLES DRAWN AEROBIC AND ANAEROBIC Blood Culture adequate volume   Culture   Final    NO GROWTH 5 DAYS Performed at [facility], [place]  [address], [place], [place] [address]    Report Status 05/24/2020 FINAL  Final  Urine Culture     Status: None   Collection Time: 05/19/20  3:01 PM   Specimen: Urine, Random  Result Value Ref Range Status   Specimen Description URINE, RANDOM  Final   Special Requests NONE  Final   Culture   Final    NO GROWTH Performed at [facility], [place] [address] [place], [place] [address]    Report Status 05/20/2020 FINAL  Final  MRSA PCR Screening     Status: None   Collection Time: 05/20/20 10:53 PM   Specimen: Nasopharyngeal  Result Value Ref Range Status   MRSA by PCR  NEGATIVE NEGATIVE Final    Comment:        The GeneXpert MRSA Assay (FDA approved for NASAL specimens only), is one component of a comprehensive MRSA colonization surveillance program. It is not intended to diagnose MRSA infection nor to guide or monitor treatment for MRSA infections. Performed at [facility], [place] [address] [place], [place] [address]     RADIOLOGY STUDIES/RESULTS: No results found.   LOS: 6 days   Oren Binet, MD  [facility]    To contact the attending provider between 7A-7P or the covering provider during after hours 7P-7A, please log into the web site [email or web address] and access using universal [place] password for that web site. If you do not have the password, please call the hospital operator.  05/25/2020, 1:50 PM

## 2020-05-26 ENCOUNTER — Encounter: Payer: Self-pay | Admitting: Speech Pathology

## 2020-05-26 ENCOUNTER — Encounter (HOSPITAL_COMMUNITY): Payer: Self-pay | Admitting: Physical Medicine and Rehabilitation

## 2020-05-26 ENCOUNTER — Inpatient Hospital Stay (HOSPITAL_COMMUNITY)
Admission: AD | Admit: 2020-05-26 | Discharge: 2020-05-30 | DRG: 945 | Disposition: A | Discharge status: Home / Self Care | Payer: Medicare Other | Source: Intra-hospital | Attending: Physical Medicine and Rehabilitation | Admitting: Physical Medicine and Rehabilitation

## 2020-05-26 ENCOUNTER — Other Ambulatory Visit: Payer: Self-pay | Admitting: Physician Assistant

## 2020-05-26 ENCOUNTER — Other Ambulatory Visit: Payer: Self-pay

## 2020-05-26 DIAGNOSIS — N170 Acute kidney failure with tubular necrosis: Secondary | ICD-10-CM | POA: Diagnosis present

## 2020-05-26 DIAGNOSIS — N189 Chronic kidney disease, unspecified: Secondary | ICD-10-CM | POA: Diagnosis present

## 2020-05-26 DIAGNOSIS — Z6839 Body mass index (BMI) 39.0-39.9, adult: Secondary | ICD-10-CM | POA: Diagnosis not present

## 2020-05-26 DIAGNOSIS — D509 Iron deficiency anemia, unspecified: Secondary | ICD-10-CM | POA: Diagnosis present

## 2020-05-26 DIAGNOSIS — Z7952 Long term (current) use of systemic steroids: Secondary | ICD-10-CM

## 2020-05-26 DIAGNOSIS — T508X5A Adverse effect of diagnostic agents, initial encounter: Secondary | ICD-10-CM | POA: Diagnosis present

## 2020-05-26 DIAGNOSIS — G629 Polyneuropathy, unspecified: Secondary | ICD-10-CM | POA: Diagnosis present

## 2020-05-26 DIAGNOSIS — M301 Polyarteritis with lung involvement [Churg-Strauss]: Secondary | ICD-10-CM

## 2020-05-26 DIAGNOSIS — J8281 Chronic eosinophilic pneumonia: Secondary | ICD-10-CM | POA: Diagnosis present

## 2020-05-26 DIAGNOSIS — Z79890 Hormone replacement therapy (postmenopausal): Secondary | ICD-10-CM

## 2020-05-26 DIAGNOSIS — I480 Paroxysmal atrial fibrillation: Secondary | ICD-10-CM | POA: Diagnosis present

## 2020-05-26 DIAGNOSIS — J984 Other disorders of lung: Secondary | ICD-10-CM

## 2020-05-26 DIAGNOSIS — R079 Chest pain, unspecified: Secondary | ICD-10-CM

## 2020-05-26 DIAGNOSIS — Z7901 Long term (current) use of anticoagulants: Secondary | ICD-10-CM

## 2020-05-26 DIAGNOSIS — Z9981 Dependence on supplemental oxygen: Secondary | ICD-10-CM

## 2020-05-26 DIAGNOSIS — K219 Gastro-esophageal reflux disease without esophagitis: Secondary | ICD-10-CM

## 2020-05-26 DIAGNOSIS — Z87891 Personal history of nicotine dependence: Secondary | ICD-10-CM | POA: Diagnosis not present

## 2020-05-26 DIAGNOSIS — R5381 Other malaise: Secondary | ICD-10-CM | POA: Diagnosis present

## 2020-05-26 DIAGNOSIS — I5022 Chronic systolic (congestive) heart failure: Secondary | ICD-10-CM | POA: Diagnosis present

## 2020-05-26 DIAGNOSIS — J9611 Chronic respiratory failure with hypoxia: Secondary | ICD-10-CM | POA: Diagnosis present

## 2020-05-26 MED ORDER — PREDNISONE 20 MG PO TABS: 20.0000 mg | ORAL_TABLET | Freq: Every day | ORAL | Status: DC

## 2020-05-26 MED ORDER — GUAIFENESIN-DM 100-10 MG/5ML PO SYRP: 5.0000 mL | ORAL_SOLUTION | Freq: Four times a day (QID) | ORAL | Status: DC | PRN

## 2020-05-26 MED ORDER — PANTOPRAZOLE SODIUM 40 MG PO TBEC
40.0000 mg | DELAYED_RELEASE_TABLET | Freq: Every day | ORAL | Status: DC
  Administered 2020-05-27 – 2020-05-30 (×4): 40 mg via ORAL
  Filled 2020-05-26 (×4): qty 1

## 2020-05-26 MED ORDER — DOCUSATE SODIUM 100 MG PO CAPS
100.0000 mg | ORAL_CAPSULE | Freq: Two times a day (BID) | ORAL | Status: DC | PRN
  Administered 2020-05-26: 100 mg via ORAL
  Filled 2020-05-26: qty 1

## 2020-05-26 MED ORDER — BISACODYL 10 MG RE SUPP: 10.0000 mg | Freq: Every day | RECTAL | Status: DC | PRN

## 2020-05-26 MED ORDER — RIVAROXABAN 20 MG PO TABS
20.0000 mg | ORAL_TABLET | Freq: Every day | ORAL | Status: DC
  Administered 2020-05-26 – 2020-05-29 (×4): 20 mg via ORAL
  Filled 2020-05-26 (×4): qty 1

## 2020-05-26 MED ORDER — LEVOTHYROXINE SODIUM 100 MCG PO TABS
200.0000 ug | ORAL_TABLET | Freq: Every day | ORAL | Status: DC
  Administered 2020-05-27 – 2020-05-30 (×4): 200 ug via ORAL
  Filled 2020-05-26 (×4): qty 2

## 2020-05-26 MED ORDER — ACETAMINOPHEN 325 MG PO TABS: 325.0000 mg | ORAL_TABLET | ORAL | Status: DC | PRN

## 2020-05-26 MED ORDER — IPRATROPIUM-ALBUTEROL 0.5-2.5 (3) MG/3ML IN SOLN: 3.0000 mL | Freq: Four times a day (QID) | RESPIRATORY_TRACT | Status: DC | PRN

## 2020-05-26 MED ORDER — PROCHLORPERAZINE MALEATE 5 MG PO TABS: 5.0000 mg | ORAL_TABLET | Freq: Four times a day (QID) | ORAL | Status: DC | PRN

## 2020-05-26 MED ORDER — ALUM & MAG HYDROXIDE-SIMETH 200-200-20 MG/5ML PO SUSP: 30.0000 mL | ORAL | Status: DC | PRN

## 2020-05-26 MED ORDER — ONDANSETRON HCL 4 MG/2ML IJ SOLN: 4.0000 mg | Freq: Four times a day (QID) | INTRAMUSCULAR | Status: DC | PRN

## 2020-05-26 MED ORDER — CLOPIDOGREL BISULFATE 75 MG PO TABS
75.0000 mg | ORAL_TABLET | Freq: Every day | ORAL | Status: DC
  Administered 2020-05-27 – 2020-05-30 (×4): 75 mg via ORAL
  Filled 2020-05-26 (×4): qty 1

## 2020-05-26 MED ORDER — BUMETANIDE 1 MG PO TABS
1.0000 mg | ORAL_TABLET | Freq: Every day | ORAL | Status: DC
  Administered 2020-05-27 – 2020-05-30 (×4): 1 mg via ORAL
  Filled 2020-05-26 (×4): qty 1

## 2020-05-26 MED ORDER — LEVALBUTEROL HCL 0.63 MG/3ML IN NEBU: 0.6300 mg | INHALATION_SOLUTION | Freq: Four times a day (QID) | RESPIRATORY_TRACT | Status: DC | PRN

## 2020-05-26 MED ORDER — POLYETHYLENE GLYCOL 3350 17 G PO PACK: 17.0000 g | PACK | Freq: Every day | ORAL | Status: DC | PRN

## 2020-05-26 MED ORDER — PREGABALIN 75 MG PO CAPS
75.0000 mg | ORAL_CAPSULE | Freq: Two times a day (BID) | ORAL | Status: DC
  Administered 2020-05-26 – 2020-05-30 (×8): 75 mg via ORAL
  Filled 2020-05-26 (×8): qty 1

## 2020-05-26 MED ORDER — TRAZODONE HCL 50 MG PO TABS: 25.0000 mg | ORAL_TABLET | Freq: Every evening | ORAL | Status: DC | PRN

## 2020-05-26 MED ORDER — PROCHLORPERAZINE 25 MG RE SUPP: 12.5000 mg | Freq: Four times a day (QID) | RECTAL | Status: DC | PRN

## 2020-05-26 MED ORDER — ORAL CARE MOUTH RINSE
15.0000 mL | Freq: Two times a day (BID) | OROMUCOSAL | Status: DC
  Administered 2020-05-26 – 2020-05-30 (×7): 15 mL via OROMUCOSAL

## 2020-05-26 MED ORDER — PANTOPRAZOLE SODIUM 40 MG PO TBEC: 40.0000 mg | DELAYED_RELEASE_TABLET | Freq: Two times a day (BID) | ORAL | 3 refills | Status: DC

## 2020-05-26 MED ORDER — GUAIFENESIN ER 600 MG PO TB12
600.0000 mg | ORAL_TABLET | Freq: Two times a day (BID) | ORAL | Status: DC
  Administered 2020-05-26 – 2020-05-30 (×8): 600 mg via ORAL
  Filled 2020-05-26 (×8): qty 1

## 2020-05-26 MED ORDER — PREDNISONE 20 MG PO TABS
20.0000 mg | ORAL_TABLET | Freq: Every day | ORAL | Status: DC
  Administered 2020-05-27 – 2020-05-30 (×4): 20 mg via ORAL
  Filled 2020-05-26 (×4): qty 1

## 2020-05-26 MED ORDER — FLEET ENEMA 7-19 GM/118ML RE ENEM: 1.0000 | ENEMA | Freq: Once | RECTAL | Status: DC | PRN

## 2020-05-26 MED ORDER — DIPHENHYDRAMINE HCL 12.5 MG/5ML PO ELIX: 12.5000 mg | ORAL_SOLUTION | Freq: Four times a day (QID) | ORAL | Status: DC | PRN

## 2020-05-26 MED ORDER — OXYCODONE HCL 5 MG PO TABS
10.0000 mg | ORAL_TABLET | Freq: Four times a day (QID) | ORAL | Status: DC | PRN
  Administered 2020-05-26 – 2020-05-27 (×2): 10 mg via ORAL
  Filled 2020-05-26 (×2): qty 2

## 2020-05-26 MED ORDER — BUMETANIDE 2 MG PO TABS: 1.0000 mg | ORAL_TABLET | Freq: Every day | ORAL | 1 refills | Status: DC

## 2020-05-26 MED ORDER — CLOPIDOGREL BISULFATE 75 MG PO TABS: 75.0000 mg | ORAL_TABLET | Freq: Every day | ORAL | Status: DC

## 2020-05-26 MED ORDER — METOPROLOL SUCCINATE ER 25 MG PO TB24
12.5000 mg | ORAL_TABLET | Freq: Every day | ORAL | Status: DC
  Administered 2020-05-27 – 2020-05-30 (×4): 12.5 mg via ORAL
  Filled 2020-05-26 (×4): qty 1

## 2020-05-26 MED ORDER — AMIODARONE HCL 200 MG PO TABS
200.0000 mg | ORAL_TABLET | Freq: Every day | ORAL | Status: DC
  Administered 2020-05-27 – 2020-05-30 (×4): 200 mg via ORAL
  Filled 2020-05-26 (×4): qty 1

## 2020-05-26 MED ORDER — PROCHLORPERAZINE EDISYLATE 10 MG/2ML IJ SOLN: 5.0000 mg | Freq: Four times a day (QID) | INTRAMUSCULAR | Status: DC | PRN

## 2020-05-26 MED ORDER — BUMETANIDE 1 MG PO TABS: 1.0000 mg | ORAL_TABLET | Freq: Every day | ORAL | Status: DC

## 2020-05-26 MED ORDER — DIAZEPAM 5 MG PO TABS: 5.0000 mg | ORAL_TABLET | Freq: Two times a day (BID) | ORAL | Status: DC | PRN

## 2020-05-26 NOTE — Progress Notes (Signed)
Progress Note  Patient Name: Nicole Norton Date of Encounter: 05/26/2020  [facility] Cardiologist: Quay Burow, MD   Subjective   Doing well.  Denies any chest pain or SOB.  Going to inpt rehab today.  Inpatient Medications    Scheduled Meds: . amiodarone  200 mg Oral Daily  . [START ON 05/27/2020] bumetanide  1 mg Oral Daily  . Chlorhexidine Gluconate Cloth  6 each Topical Daily  . clopidogrel  75 mg Oral Daily  . guaiFENesin  600 mg Oral BID  . levothyroxine  200 mcg Oral QAC breakfast  . mouth rinse  15 mL Mouth Rinse BID  . metoprolol succinate  12.5 mg Oral Daily  . pantoprazole  40 mg Oral Daily  . [START ON 05/27/2020] predniSONE  20 mg Oral Daily  . pregabalin  75 mg Oral BID  . rivaroxaban  20 mg Oral Q supper   Continuous Infusions:  PRN Meds: acetaminophen, diazepam, docusate sodium, ipratropium-albuterol, levalbuterol, ondansetron (ZOFRAN) IV, oxyCODONE, polyethylene glycol   Vital Signs    Vitals:   05/26/20 0511 05/26/20 0800 05/26/20 0904 05/26/20 0917  BP: 119/85 111/88 120/66 120/66  Pulse: (!) 50 (!) 58 (!) 52 (!) 56  Resp: 16 16 17    Temp: 97.8 F (36.6 C) 97.8 F (36.6 C) 97.8 F (36.6 C)   TempSrc: Oral Oral Oral   SpO2: 100% 99% 99%   Weight: 111.1 kg     Height:        Intake/Output Summary (Last 24 hours) at 05/26/2020 0945 Last data filed at 05/26/2020 0548 Gross per 24 hour  Intake 120 ml  Output --  Net 120 ml   Last 3 Weights 05/26/2020 05/25/2020 05/23/2020  Weight (lbs) 244 lb 14.9 oz 249 lb 5.4 oz 249 lb 9 oz  Weight (kg) 111.1 kg 113.1 kg 113.2 kg      Telemetry    Sinus bradycardia- Personally Reviewed  ECG    No new EKG to review Physical Exam   GEN: Well nourished, well developed in no acute distress HEENT: Normal NECK: No JVD; No carotid bruits LYMPHATICS: No lymphadenopathy CARDIAC:RRR, no murmurs, rubs, gallops RESPIRATORY:  Clear to auscultation without rales, wheezing or rhonchi  ABDOMEN:  Soft, non-tender, non-distended MUSCULOSKELETAL:  No edema; No deformity  SKIN: Warm and dry NEUROLOGIC:  Alert and oriented x 3 PSYCHIATRIC:  Normal affect    Labs    High Sensitivity Troponin:   Recent Labs  Lab 05/19/20 1527 05/19/20 1650  TROPONINIHS 1,137* 1,836*      Chemistry Recent Labs  Lab 05/19/20 1725 05/19/20 2007 05/23/20 0720 05/24/20 0322 05/25/20 0721  NA 145   < > 142 141 141  K 2.9*   < > 3.7 4.8 4.4  CL 104   < > 100 105 101  CO2 25   < > 31 28 30   GLUCOSE 129*   < > 90 86 85  BUN 18   < > 33* 29* 27*  CREATININE 1.59*   < > 1.73* 1.52* 1.43*  CALCIUM 8.7*   < > 9.4 8.8* 9.4  PROT 5.8*  --   --   --   --   ALBUMIN 3.0*  --   --   --   --   AST 44*  --   --   --   --   ALT 33  --   --   --   --   ALKPHOS 74  --   --   --   --  BILITOT 1.5*  --   --   --   --   GFRNONAA 31*   < > 28* 33* 35*  GFRAA 36*   < > 33* 38* 41*  ANIONGAP 16*   < > 11 8 10    < > = values in this interval not displayed.     Hematology Recent Labs  Lab 05/22/20 0327 05/23/20 0720 05/24/20 0322  WBC 8.9 9.2 9.7  RBC 3.41* 3.83* 3.41*  HGB 11.4* 12.8 11.3*  HCT 36.1 40.5 35.5*  MCV 105.9* 105.7* 104.1*  MCH 33.4 33.4 33.1  MCHC 31.6 31.6 31.8  RDW 14.1 13.9 13.7  PLT 168 202 161    BNP Recent Labs  Lab 05/19/20 1727  BNP 4,213.3*     DDimer No results for input(s): DDIMER in the last 168 hours.   Radiology    No results found.  Cardiac Studies   2D echo 05/20/2020  IMPRESSIONS  1. Technically difficult; LV function appears low normal; grade 2  diastolic dysfunction; mild LVE; mild MR and TR.  2. Left ventricular ejection fraction, by estimation, is 50 to 55%. The  left ventricle has low normal function. The left ventricle has no regional  wall motion abnormalities. The left ventricular internal cavity size was  mildly dilated. Left ventricular  diastolic parameters are consistent with Grade II diastolic dysfunction  (pseudonormalization).    3. Right ventricular systolic function is normal. The right ventricular  size is normal. There is mildly elevated pulmonary artery systolic  pressure.  4. The mitral valve is normal in structure. Mild mitral valve  regurgitation. No evidence of mitral stenosis.  5. The aortic valve is tricuspid. Aortic valve regurgitation is not  visualized. Mild aortic valve sclerosis is present, with no evidence of  aortic valve stenosis.  6. The inferior vena cava is normal in size with greater than 50%  respiratory variability, suggesting right atrial pressure of 3 mmHg.   Patient Profile     79 y.o. 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 NSTEMI: -Likely due to demand ischemia but coronary artery disease cannot be fully excluded. -2D echo with low normal LVF with EF 50-55% and no RWMAs reported but Dr. Sallyanne Kuster had personally reviewed echo and felt there was Hk of the inferior and inferolateral walls.     -she has not had any anginal sx -was felt not to be a good candidate for coronary CTA due to obesity and advanced age and felt not to be a good candidate for cath at this time -oupt nuclear stress test was recommended and we will set this up for the next 1-2 weeks  2. CHF:  -ejection fraction has improved from 25-30% a year ago to now 50-55%  3. PAfib: -Has required cardioversion in the past.   -CHA2DS2-VASc of 5-6.   -remains in NSR -Xarelto restarted -continue Amio -May ultimately need to stop amiodarone due to chronic lung issues.  We will continue with current monitoring.  AST minimally  elevated and could be due to MI.   4. Long QT: Avoiding QT prolonging drugs.  5. AKI:  -Creatinine has been improving and now down to 1.43 yesterday.    6.  Ac on chronic respiratory failure:  -Likely multifactorial with pneumonia on top of chronic lung disease.   -lungs are clear today  7. PAH: -likely due to chronic lung disease.   8. Aortic atherosclerosis: -Noted on CT.   -Needs lipid-lowering medications with LDL goal less than 70 but statin intolerant so will refer to lipid clinic for PCSK 9i or bempedoic acid  9. HLP:   -Statin intolerant.   -Will need lipid clinic at discharge.       I have spent a total of 35 minutes with patient reviewing 2D echo, hospital notes , telemetry, EKGs, labs and examining patient as well as establishing an assessment and plan that was discussed with the patient.  > 50% of time was spent in direct patient care.    [facility] will sign off.   Medication Recommendations:  Amiodarone 200mg  daily, Xarelto 20mg  daily, Plavix 75mg  daily, Bumex 1mg  daily, Toprol XL 12.5mg  daily  Other recommendations (labs, testing, etc):  Lexiscan myoview outpt Follow up as an outpatient:  Followup with Dr. [name] Found after nuclear stress test  For questions or updates, please contact [facility] Please consult [email or web address] for contact info under        Signed, Fransico Him, MD  05/26/2020, 9:45 AM
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
## 2020-05-26 NOTE — Progress Notes (Signed)
Inpatient Rehabilitation Medication Review by a Pharmacist  A complete drug regimen review was completed for this patient to identify any potential clinically significant medication issues.  Clinically significant medication issues were identified:  no  Pharmacist comments: Discharge summary stated Synthroid 255mcg daily total (PTA med rec inpatient had Synthroid 256mcg tablet + 55mcg tablet, for total 27mcg daily). Last fill of 80mcg tablet was 02/06/20, so appears to only be on 215mcg daily now. Will only continue Synthroid 240mcg daily at this time (as was dosed inpatient).  Time spent performing this drug regimen review (minutes):  15 minutes   Margretta Sidle Dohlen 05/26/2020 4:55 PM

## 2020-05-26 NOTE — Discharge Summary (Signed)
PATIENT DETAILS Name: Nicole Norton Age: 79 y.o. Sex: female Date of Birth: Dec 30, 1940 MRN: [id number]. Admitting Physician: Kipp Brood, MD TSV:[id number], Rene Kocher, MD  Admit Date: 05/19/2020 Discharge date: 05/26/2020  Recommendations for Outpatient Follow-up:  1. Follow up with PCP in 1-2 weeks 2. Please obtain CMP/CBC in one week 3. Lung nodule-needs repeat CT chest in 3 months-see below.   Admitted From:  Home   Disposition: [place]: No  Equipment/Devices: None  Discharge Condition: Stable  CODE STATUS: FULL CODE  Diet recommendation:  Diet Order            Diet - low sodium heart healthy           Diet heart healthy/carb modified Room service appropriate? Yes; Fluid consistency: Thin; Fluid restriction: 1800 mL Fluid  Diet effective now                  Brief Narrative: Patient is a 79 y.o. female with history of chronic systolic heart failure, Churg-Strauss syndrome, chronic hypoxic respiratory failure on 2-3 L of oxygen at home, PAF on Xarelto who presented to the ED with shortness of breath-she was found to have severe hypoxemia-felt to be from CAP, decompensated heart failure-she was admitted by the hospitalist service-however post admission-patient became more hypoxic-developed hypotension and acute metabolic encephalopathy-subsequently transferred to the [place] stability-she was transferred to the hospitalist service.  See below for further details.  Significant events: 7/5>> admit to [facility] for acute on chronic hypoxemic respiratory failure from COPD, decompensated heart failure. 7/5>> worsening hypoxemia/confusion/hypotension-transferred to ICU-started on dobutamine infusion 7/8>> transfer to [facility]  Significant studies: 05/19/2020>> CTA chest: No PE, airspace opacity in the dependent right lower lobe. 05/19/2020>> CT head: No acute intracranial abnormality. 05/19/2020>> chest x-ray: Stable chest exam-no interval change/acute  process. 05/20/2020>> TTE: EF 09-23%, grade 2 diastolic dysfunction. 05/14/2019>> TTE: EF 25-30%  Antimicrobial therapy: Rocephin: 7/5>>7/10 Doxycycline: 7/6>>7/10 Zithromax: 7/5 x 1  Microbiology data: 05/19/2020>> blood culture: No growth 05/19/2020>> urine culture: No growth  Procedures : None  Consults: Cardiology, PCCM   Brief Hospital Course: Sepsis secondary to right lower lobe pneumonia: Sepsis physiology has resolved-culture data as above-completed a course of Rocephin/doxycycline.   Hypotension/shock:multifactorial-likely cardiogenic and sepsis. Required dobutamine briefly in the ICU-currently blood pressure stable.  Acute on chronic hypoxic respiratory failure (on home O2 2-3 L at all times): Acute hypoxia felt to be secondary to pneumonia/decompensated heart failure.  Required high flow O2 while in the ICU-significantly better-titrated down to 2 L of oxygen which is her usual baseline.  She was managed with antibiotics, diuretics and other supportive measures.  Has chronic hypoxemic respiratory failure and is on 2-3 l of home O2.[name]  AKI on CKD stage IIIb: Multifactorial-likely hemodynamically mediated from hypotension/sepsis and contrast-induced nephropathy.  Improving with supportive care.    Acute metabolic encephalopathy: Secondary to hypoxia/hypotension-resolved.  She is back to baseline.  CT head was negative for acute abnormalities.  Acute on chronic diastolic heart failure: Volume status relatively stable-have resumed her usual dosing of Bumex now that her creatinine is stable.  PAF: In sinus rhythm-rate controlled with amiodarone, metoprolol-back on Xarelto-briefly on IV heparin due to AKI.   Elevated troponin: Secondary to demand ischemia-cardiology recommending outpatient stress test.  On Plavix-Per cardiology note on 7/8-lower  risk of bleeding with Xarelto/Plavix than ASA  Hypothyroidism: Continue Synthroid  Prolonged QTC: Resolved-reviewed EKG on  7/10.  Continue telemetry monitoring-patient is on amiodarone.    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  History of eosinophilic granulomatosis with polyangiitis-chronic hypoxic respiratory failure: On home O2 and chronic steroids (20 mg daily).  Tapering steroids back to usual home regimen of prednisone 20 mg daily.  Polyneuropathy: Stable-continue Lyrica  Nodular opacity 2.3 x 1.5 cm in the right lung lower lobe: Stable for outpatient follow-up-radiology recommending repeat CT in 3 months to ensure resolution.  Debility/deconditioning: Secondary to acute illness-evaluated by rehab services-recommendations are for CIR  Morbid Obesity: Estimated body mass index is 40.24 kg/m as calculated from the following:   Height as of this encounter: 5\' 6"  (1.676 m).   Weight as of this encounter: 113.1 kg.    Discharge Diagnoses:  Principal Problem:   Sepsis ([place]) Active Problems:   Hypothyroidism   Acute on chronic respiratory failure with hypoxia (HCC)   Hypercholesterolemia   CKD (chronic kidney disease) stage 3, GFR 30-59 ml/min   Acute on chronic combined systolic and diastolic CHF (congestive heart failure) (HCC)   Community acquired pneumonia   Elevated troponin   Non-ST elevation (NSTEMI) myocardial infarction [facility])   Discharge Instructions:  Activity:  As tolerated with Full fall precautions use walker/cane & assistance as needed  Discharge Instructions    Call MD for:  difficulty breathing, headache or visual disturbances   Complete by: As directed    Call MD for:  extreme fatigue   Complete by: As directed    Call MD for:  persistant dizziness or light-headedness   Complete by: As directed    Diet - low sodium heart healthy   Complete by: As directed    Increase activity slowly   Complete by: As directed      Allergies as of 05/26/2020      Reactions   Allopurinol Palpitations   Uloric [febuxostat] Other (See Comments)    Episodes of AFIB   Ace Inhibitors Other (See Comments)   Renal function   Colchicine Other (See Comments)   palpitations   Cymbalta [duloxetine Hcl] Other (See Comments)   palpitations   Etodolac Other (See Comments)   heart flutters and headaches    Fluoxetine Other (See Comments)   Jitteriness   Omeprazole Nausea Only   Paroxetine Nausea Only   Dizzy   Solu-medrol [methylprednisolone Sodium Succ]    Heart palpitations/bad headache   Statins Tinitus   REACTION: myalgias REACTION: myalgias   Lasix [furosemide] Rash   Does not work very well   Penicillins Rash   Tolerated Ceftriaxone 4/10-4/11/20 \ Did it involve swelling of the face/tongue/throat, SOB, or low BP? No Did it involve sudden or severe rash/hives, skin peeling, or any reaction on the inside of your mouth or nose? No Did you need to seek medical attention at a hospital or doctor's office? No When did it last happen?35 years ago If all above answers are "NO", may proceed with cephalosporin use.      Medication List    STOP taking these medications   lansoprazole 30 MG capsule Commonly known as: PREVACID Replaced by: pantoprazole 40 MG tablet     TAKE these medications   AMBULATORY NON FORMULARY MEDICATION On continuous oxygen 2 L.  Stationary pulse ox at 88%.  Drops with activity.  Diagnosis restrictive lung disease with hypoxemia. Portable gas via nasal cannula.   amiodarone 200 MG tablet Commonly known as: PACERONE Take 1 tablet (200 mg total) by mouth daily.   Amitiza 24 MCG capsule Generic drug: lubiprostone Take 24 mcg by mouth 2 (two) times daily  with a meal.   bumetanide 2 MG tablet Commonly known as: BUMEX Take 0.5 tablets (1 mg total) by mouth daily. What changed: how much to take   CENTRUM SILVER PO Take 1 tablet by mouth daily.   cholecalciferol 25 MCG (1000 UNIT) tablet Commonly known as: VITAMIN D3 Take 1,000 Units by mouth daily.   clobetasol cream 0.05 % Commonly known as:  TEMOVATE Apply 1 application topically daily as needed (on affected area(s)).   clopidogrel 75 MG tablet Commonly known as: PLAVIX Take 1 tablet (75 mg total) by mouth daily. Start taking on: May 27, 2020   diazepam 5 MG tablet Commonly known as: VALIUM Take 1 tablet (5 mg total) by mouth 2 (two) times daily. What changed:   when to take this  reasons to take this   diclofenac 1.3 % Ptch Commonly known as: FLECTOR Place 1 patch onto the skin every 12 (twelve) hours.   EPINEPHrine 0.3 mg/0.3 mL Soaj injection Commonly known as: EPI-PEN Inject 0.3 mg into the muscle once.   ferrous sulfate 325 (65 FE) MG tablet Take 1 tablet (325 mg total) by mouth every Monday, Wednesday, and Friday.   Fish Oil 1200 MG Caps Take 1,200 mg by mouth daily.   FLUoxetine 20 MG capsule Commonly known as: PROZAC TAKE ONE CAPSULE (20 MG) BY MOUTH DAILY. MAY INCREASE TO TWO CAPSULES AFTER TEN DAYS What changed: See the new instructions.   HYDROmorphone 2 MG tablet Commonly known as: DILAUDID Take 2 mg by mouth every 4 (four) hours as needed for severe pain.   levothyroxine 200 MCG tablet Commonly known as: SYNTHROID Take 1 tablet (200 mcg total) by mouth daily before breakfast. What changed: Another medication with the same name was changed. Make sure you understand how and when to take each.   levothyroxine 25 MCG tablet Commonly known as: SYNTHROID TAKE ONE TABLET BY MOUTH EVERY DAY BEFORE BREAKFAST What changed: See the new instructions.   metolazone 2.5 MG tablet Commonly known as: ZAROXOLYN Take one tablet by mouth as needed for weight gain of 3lbs from baseline. What changed:   how much to take  how to take this  when to take this  reasons to take this  additional instructions   metoprolol succinate 25 MG 24 hr tablet Commonly known as: TOPROL-XL Take 1 tablet (25 mg total) by mouth 2 (two) times daily.   Nucala 100 MG/ML Soaj Generic drug: Mepolizumab Inject 100 mg  into the skin every 30 (thirty) days.   Oxycodone HCl 10 MG Tabs Take 10 mg by mouth 5 (five) times daily as needed (pain).   pantoprazole 40 MG tablet Commonly known as: PROTONIX Take 1 tablet (40 mg total) by mouth 2 (two) times daily. Replaces: lansoprazole 30 MG capsule   potassium chloride SA 20 MEQ tablet Commonly known as: KLOR-CON Take 1 tablet (20 mEq total) by mouth 2 (two) times daily.   predniSONE 20 MG tablet Commonly known as: DELTASONE Take 1 tablet (20 mg total) by mouth daily. What changed: how much to take   pregabalin 75 MG capsule Commonly known as: LYRICA Take 75 mg by mouth 3 (three) times daily.   Premarin 0.625 MG tablet Generic drug: estrogens (conjugated) TAKE 1 TABLET DAILY AS NEEDED What changed:   how much to take  when to take this   vitamin B-12 500 MCG tablet Commonly known as: CYANOCOBALAMIN Take 500 mcg by mouth every Monday, Wednesday, and Friday.   Xarelto 20 MG Tabs  tablet Generic drug: rivaroxaban TAKE 1 TABLET DAILY WITH SUPPER What changed: See the new instructions.   ZyrTEC 10 MG tablet Generic drug: cetirizine Take 10 mg by mouth daily.       Follow-up Information    Hali Marry, MD. Schedule an appointment as soon as possible for a visit in 1 week(s).   Specialty: Family Medicine Contact information: [address] [place] [address] [phone]        Constance Haw, MD. Schedule an appointment as soon as possible for a visit in 2 week(s).   Specialty: Cardiology Contact information: [address] [place] [address] [phone]        Lorretta Harp, MD. Schedule an appointment as soon as possible for a visit in 1 month(s).   Specialties: Cardiology, Radiology Contact information: [address] [place] [address] [phone]              Allergies  Allergen Reactions  . Allopurinol Palpitations  . Uloric [Febuxostat]  Other (See Comments)    Episodes of AFIB  . Ace Inhibitors Other (See Comments)    Renal function  . Colchicine Other (See Comments)    palpitations  . Cymbalta [Duloxetine Hcl] Other (See Comments)    palpitations  . Etodolac Other (See Comments)    heart flutters and headaches   . Fluoxetine Other (See Comments)    Jitteriness  . Omeprazole Nausea Only  . Paroxetine Nausea Only    Dizzy  . Solu-Medrol [Methylprednisolone Sodium Succ]     Heart palpitations/bad headache  . Statins Tinitus    REACTION: myalgias REACTION: myalgias  . Lasix [Furosemide] Rash    Does not work very well  . Penicillins Rash    Tolerated Ceftriaxone 4/10-4/11/20 \ Did it involve swelling of the face/tongue/throat, SOB, or low BP? No Did it involve sudden or severe rash/hives, skin peeling, or any reaction on the inside of your mouth or nose? No Did you need to seek medical attention at a hospital or doctor's office? No When did it last happen?35 years ago If all above answers are "NO", may proceed with cephalosporin use.     Other Procedures/Studies: DG Lumbar Spine Complete  Result Date: 05/19/2020 CLINICAL DATA:  Hypertension, AFib, fall EXAM: LUMBAR SPINE - COMPLETE 4+ VIEW; PORTABLE CHEST - 1 VIEW COMPARISON:  01/30/2020 FINDINGS: Chronic right hemidiaphragm elevation. Stable cardiomegaly with vascular congestion. No new focal pneumonia, collapse or consolidation. No large effusion or pneumothorax. Similar basilar atelectasis pattern. Trachea is midline. Aorta atherosclerotic and degenerative changes of the spine. IMPRESSION: Stable chest exam.  No interval change or acute process Electronically Signed   By: Jerilynn Mages.  Shick M.D.   On: 05/19/2020 08:48   CT Head Wo Contrast  Result Date: 05/19/2020 CLINICAL DATA:  Focal neurologic deficit more than 6 hours stroke suspected. Post fall. Now complaining of back pain. EXAM: CT HEAD WITHOUT CONTRAST TECHNIQUE: Contiguous axial images were obtained from  the base of the skull through the vertex without intravenous contrast. COMPARISON:  02/22/2019 FINDINGS: Brain: There is mild central and cortical atrophy. There is no intra or extra-axial fluid collection or mass lesion. The basilar cisterns and ventricles have a normal appearance. There is no CT evidence for acute infarction or hemorrhage. Vascular: There is minimal atherosclerotic calcification of the internal carotid arteries. No hyperdense vessels. Skull: Normal. Negative for fracture or focal lesion. Sinuses/Orbits: Air-fluid levels are identified within the maxillary sinuses  bilaterally. Other: Study quality is degraded by patient motion artifact. IMPRESSION: 1. No evidence for acute intracranial abnormality. 2. Mild atrophy. 3. Air-fluid levels within the maxillary sinuses bilaterally. 4. Significant patient motion artifact. Electronically Signed   By: Nolon Nations M.D.   On: 05/19/2020 11:00   CT Angio Chest PE W/Cm &/Or Wo Cm  Result Date: 05/19/2020 CLINICAL DATA:  Shortness of breath EXAM: CT ANGIOGRAPHY CHEST WITH CONTRAST TECHNIQUE: Multidetector CT imaging of the chest was performed using the standard protocol during bolus administration of intravenous contrast. Multiplanar CT image reconstructions and MIPs were obtained to evaluate the vascular anatomy. CONTRAST:  68mL OMNIPAQUE IOHEXOL 350 MG/ML SOLN COMPARISON:  01/30/2020 FINDINGS: Cardiovascular: Examination for pulmonary embolism is limited by reduced contrast dose, poor contrast bolus, motion artifact, and streak artifact from patient arm positioning, main pulmonary artery HU = 98. Within this limitation, there is no evidence of pulmonary embolism through the lobar pulmonary arteries. Evaluation of the segmental and more distal arteries is generally nondiagnostic. Cardiomegaly. Gross enlargement of the main pulmonary artery measuring up to 4.3 cm. No pericardial effusion. Aortic atherosclerosis. Mediastinum/Nodes: Unchanged prominent  mediastinal lymph nodes. Moderate hiatal hernia with intrathoracic position of the gastric fundus. Thyroid gland, trachea, and esophagus demonstrate no significant findings. Lungs/Pleura: There is heterogeneous airspace opacity of the dependent right lower lobe, a new and somewhat nodular opacity measuring 2.3 x 1.5 cm (series 6, image 176). Small right pleural effusion associated atelectasis or consolidation. Upper Abdomen: Thickening of the partially imaged gallbladder wall in the included upper abdomen. Musculoskeletal: No chest wall abnormality. No acute or significant osseous findings. Review of the MIP images confirms the above findings. IMPRESSION: 1. Examination for pulmonary embolism is very limited by multiple factors as detailed above. Within this limitation, there is no evidence of pulmonary embolism through the lobar pulmonary arteries. Evaluation of the segmental and more distal arteries is generally nondiagnostic. If there is high clinical concern for pulmonary embolism, consider technical repeat examination. 2. There is heterogeneous airspace opacity of the dependent right lower lobe, a new and somewhat nodular opacity measuring 2.3 x 1.5 cm. This is nonspecific and likely infectious or inflammatory. Recommend follow-up CT in 3 months to ensure resolution. 3. Small right pleural effusion and associated atelectasis or consolidation. 4. Cardiomegaly. 5. Gross enlargement of the main pulmonary artery measuring up to 4.3 cm, as can be seen with pulmonary hypertension. 6. Moderate hiatal hernia with intrathoracic position of the gastric fundus, which may place the patient at risk for aspiration. 7. Thickening of the partially imaged gallbladder wall in the included upper abdomen, which is nonspecific and may be further evaluated by right upper quadrant ultrasound if indicated by clinical signs and symptoms. 8. Aortic Atherosclerosis (ICD10-I70.0). Electronically Signed   By: Eddie Candle M.D.   On:  05/19/2020 12:27   DG Chest Port 1 View  Result Date: 05/19/2020 CLINICAL DATA:  Hypertension, AFib, fall EXAM: LUMBAR SPINE - COMPLETE 4+ VIEW; PORTABLE CHEST - 1 VIEW COMPARISON:  01/30/2020 FINDINGS: Chronic right hemidiaphragm elevation. Stable cardiomegaly with vascular congestion. No new focal pneumonia, collapse or consolidation. No large effusion or pneumothorax. Similar basilar atelectasis pattern. Trachea is midline. Aorta atherosclerotic and degenerative changes of the spine. IMPRESSION: Stable chest exam.  No interval change or acute process Electronically Signed   By: Jerilynn Mages.  Shick M.D.   On: 05/19/2020 08:48   ECHOCARDIOGRAM COMPLETE  Result Date: 05/20/2020    ECHOCARDIOGRAM REPORT   Patient Name:   AMBERT Norton  Date of Exam: 05/20/2020 Medical Rec #:  [id number]              Height:       66.0 in Accession #:    [id number]             Weight:       252.0 lb Date of Birth:  1941/10/09              BSA:          2.206 m Patient Age:    89 years               BP:           124/77 mmHg Patient Gender: F                      HR:           58 bpm. Exam Location:  Inpatient Procedure: 2D Echo Indications:    systolic heart failure  History:        Patient has prior history of Echocardiogram examinations, most                 recent 05/14/2019. Risk Factors:Hypertension, Dyslipidemia and                 Former Smoker. Persistent a-fib. cardioversion hx.  Sonographer:    Jannett Celestine RDCS (AE) Referring Phys: [id number] GRACE E BOWSER  Sonographer Comments: Image acquisition challenging due to patient body habitus and Image acquisition challenging due to respiratory motion. restricted mobility IMPRESSIONS  1. Technically difficult; LV function appears low normal; grade 2 diastolic dysfunction; mild LVE; mild MR and TR.  2. Left ventricular ejection fraction, by estimation, is 50 to 55%. The left ventricle has low normal function. The left ventricle has no regional wall motion abnormalities. The left  ventricular internal cavity size was mildly dilated. Left ventricular diastolic parameters are consistent with Grade II diastolic dysfunction (pseudonormalization).  3. Right ventricular systolic function is normal. The right ventricular size is normal. There is mildly elevated pulmonary artery systolic pressure.  4. The mitral valve is normal in structure. Mild mitral valve regurgitation. No evidence of mitral stenosis.  5. The aortic valve is tricuspid. Aortic valve regurgitation is not visualized. Mild aortic valve sclerosis is present, with no evidence of aortic valve stenosis.  6. The inferior vena cava is normal in size with greater than 50% respiratory variability, suggesting right atrial pressure of 3 mmHg. FINDINGS  Left Ventricle: Left ventricular ejection fraction, by estimation, is 50 to 55%. The left ventricle has low normal function. The left ventricle has no regional wall motion abnormalities. The left ventricular internal cavity size was mildly dilated. There is no left ventricular hypertrophy. Left ventricular diastolic parameters are consistent with Grade II diastolic dysfunction (pseudonormalization). Right Ventricle: The right ventricular size is normal.Right ventricular systolic function is normal. There is mildly elevated pulmonary artery systolic pressure. The tricuspid regurgitant velocity is 3.00 m/s, and with an assumed right atrial pressure of  8 mmHg, the estimated right ventricular systolic pressure is 46.6 mmHg. Left Atrium: Left atrial size was normal in size. Right Atrium: Right atrial size was normal in size. Pericardium: There is no evidence of pericardial effusion. Mitral Valve: The mitral valve is normal in structure. Normal mobility of the mitral valve leaflets. Mild mitral annular calcification. Mild mitral valve regurgitation. No evidence of mitral valve stenosis. Tricuspid Valve: The tricuspid valve is normal in structure. Tricuspid  valve regurgitation is mild . No evidence of  tricuspid stenosis. Aortic Valve: The aortic valve is tricuspid. Aortic valve regurgitation is not visualized. Mild aortic valve sclerosis is present, with no evidence of aortic valve stenosis. Pulmonic Valve: The pulmonic valve was normal in structure. Pulmonic valve regurgitation is not visualized. No evidence of pulmonic stenosis. Aorta: The aortic root is normal in size and structure. Venous: The inferior vena cava is normal in size with greater than 50% respiratory variability, suggesting right atrial pressure of 3 mmHg. IAS/Shunts: No atrial level shunt detected by color flow Doppler. Additional Comments: Technically difficult; LV function appears low normal; grade 2 diastolic dysfunction; mild LVE; mild MR and TR.  LEFT VENTRICLE PLAX 2D LVIDd:         5.50 cm  Diastology LVIDs:         4.00 cm  LV e' lateral:   8.16 cm/s LV PW:         1.20 cm  LV E/e' lateral: 9.9 LV IVS:        1.00 cm  LV e' medial:    5.44 cm/s LVOT diam:     2.00 cm  LV E/e' medial:  14.9 LV SV:         67 LV SV Index:   30 LVOT Area:     3.14 cm  RIGHT VENTRICLE RV S prime:     11.90 cm/s LEFT ATRIUM           Index LA diam:      4.90 cm 2.22 cm/m LA Vol (A4C): 65.6 ml 29.74 ml/m  AORTIC VALVE LVOT Vmax:   95.80 cm/s LVOT Vmean:  65.100 cm/s LVOT VTI:    0.212 m  AORTA Ao Root diam: 2.70 cm MITRAL VALVE               TRICUSPID VALVE MV Area (PHT): 2.91 cm    TR Peak grad:   36.0 mmHg MV Decel Time: 261 msec    TR Vmax:        300.00 cm/s MV E velocity: 81.00 cm/s MV A velocity: 69.80 cm/s  SHUNTS MV E/A ratio:  1.16        Systemic VTI:  0.21 m                            Systemic Diam: 2.00 cm Kirk Ruths MD Electronically signed by Kirk Ruths MD Signature Date/Time: 05/20/2020/2:12:37 PM    Final      TODAY-DAY OF DISCHARGE:  Subjective:   Cindie Laroche today has no headache,no chest abdominal pain,no new weakness tingling or numbness, feels much better wants to go home today.   Objective:   Blood pressure 120/66,  pulse (!) 56, temperature 97.8 F (36.6 C), temperature source Oral, resp. rate 17, height 5\' 6"  (1.676 m), weight 111.1 kg, SpO2 99 %.  Intake/Output Summary (Last 24 hours) at 05/26/2020 1026 Last data filed at 05/26/2020 0548 Gross per 24 hour  Intake 120 ml  Output --  Net 120 ml   Filed Weights   05/23/20 0500 05/25/20 0520 05/26/20 0511  Weight: 113.2 kg 113.1 kg 111.1 kg    Exam: Awake Alert, Oriented *3, No new F.N deficits, Normal affect [place].AT,PERRAL Supple Neck,No JVD, No cervical lymphadenopathy appriciated.  Symmetrical Chest wall movement, Good air movement bilaterally, CTAB RRR,No Gallops,Rubs or new Murmurs, No Parasternal Heave +ve B.Sounds, Abd Soft, Non tender, No organomegaly appriciated, No rebound -  guarding or rigidity. No Cyanosis, Clubbing or edema, No new Rash or bruise   PERTINENT RADIOLOGIC STUDIES: No results found.   PERTINENT LAB RESULTS: CBC: Recent Labs    05/24/20 0322  WBC 9.7  HGB 11.3*  HCT 35.5*  PLT 161   CMET CMP     Component Value Date/Time   NA 141 05/25/2020 0721   NA 145 (H) 05/24/2019 1223   K 4.4 05/25/2020 0721   CL 101 05/25/2020 0721   CO2 30 05/25/2020 0721   GLUCOSE 85 05/25/2020 0721   BUN 27 (H) 05/25/2020 0721   BUN 39 (H) 05/24/2019 1223   CREATININE 1.43 (H) 05/25/2020 0721   CREATININE 1.30 (H) 02/12/2020 0954   CALCIUM 9.4 05/25/2020 0721   PROT 5.8 (L) 05/19/2020 1725   ALBUMIN 3.0 (L) 05/19/2020 1725   AST 44 (H) 05/19/2020 1725   ALT 33 05/19/2020 1725   ALKPHOS 74 05/19/2020 1725   BILITOT 1.5 (H) 05/19/2020 1725   GFRNONAA 35 (L) 05/25/2020 0721   GFRNONAA 40 (L) 02/12/2020 0954   GFRAA 41 (L) 05/25/2020 0721   GFRAA 46 (L) 02/12/2020 0954    GFR Estimated Creatinine Clearance: 42.3 mL/min (A) (by C-G formula based on SCr of 1.43 mg/dL (H)). No results for input(s): LIPASE, AMYLASE in the last 72 hours. No results for input(s): CKTOTAL, CKMB, CKMBINDEX, TROPONINI in the last 72  hours. Invalid input(s): POCBNP No results for input(s): DDIMER in the last 72 hours. No results for input(s): HGBA1C in the last 72 hours. No results for input(s): CHOL, HDL, LDLCALC, TRIG, CHOLHDL, LDLDIRECT in the last 72 hours. No results for input(s): TSH, T4TOTAL, T3FREE, THYROIDAB in the last 72 hours.  Invalid input(s): FREET3 No results for input(s): VITAMINB12, FOLATE, FERRITIN, TIBC, IRON, RETICCTPCT in the last 72 hours. Coags: No results for input(s): INR in the last 72 hours.  Invalid input(s): PT Microbiology: Recent Results (from the past 240 hour(s))  SARS Coronavirus 2 by RT PCR (hospital order, performed in [facility] hospital lab) Nasopharyngeal Nasopharyngeal Swab     Status: None   Collection Time: 05/19/20 12:19 PM   Specimen: Nasopharyngeal Swab  Result Value Ref Range Status   SARS Coronavirus 2 NEGATIVE NEGATIVE Final    Comment: (NOTE) SARS-CoV-2 target nucleic acids are NOT DETECTED.  The SARS-CoV-2 RNA is generally detectable in upper and lower respiratory specimens during the acute phase of infection. The lowest concentration of SARS-CoV-2 viral copies this assay can detect is 250 copies / mL. A negative result does not preclude SARS-CoV-2 infection and should not be used as the sole basis for treatment or other patient management decisions.  A negative result may occur with improper specimen collection / handling, submission of specimen other than nasopharyngeal swab, presence of viral mutation(s) within the areas targeted by this assay, and inadequate number of viral copies (<250 copies / mL). A negative result must be combined with clinical observations, patient history, and epidemiological information.  Fact Sheet for Patients:   StrictlyIdeas.no  Fact Sheet for Healthcare Providers: BankingDealers.co.za  This test is not yet approved or  cleared by the [place] FDA and has been authorized  for detection and/or diagnosis of SARS-CoV-2 by FDA under an Emergency Use Authorization (EUA).  This EUA will remain in effect (meaning this test can be used) for the duration of the COVID-19 declaration under Section 564(b)(1) of the Act, 21 U.S.C. section 360bbb-3(b)(1), unless the authorization is terminated or revoked sooner.  Performed at [facility]  [facility], [place] [address] [place], [place] [address]   Culture, blood (Routine X 2) w Reflex to ID Panel     Status: None   Collection Time: 05/19/20  2:15 PM   Specimen: BLOOD  Result Value Ref Range Status   Specimen Description BLOOD SITE NOT SPECIFIED  Final   Special Requests   Final    BOTTLES DRAWN AEROBIC AND ANAEROBIC Blood Culture adequate volume   Culture   Final    NO GROWTH 5 DAYS Performed at [facility], [address] [place], [place] [address]    Report Status 05/24/2020 FINAL  Final  Culture, blood (Routine X 2) w Reflex to ID Panel     Status: None   Collection Time: 05/19/20  2:15 PM   Specimen: BLOOD  Result Value Ref Range Status   Specimen Description BLOOD SITE NOT SPECIFIED  Final   Special Requests   Final    BOTTLES DRAWN AEROBIC AND ANAEROBIC Blood Culture adequate volume   Culture   Final    NO GROWTH 5 DAYS Performed at [facility], [place] [address] [place], [place] [address]    Report Status 05/24/2020 FINAL  Final  Urine Culture     Status: None   Collection Time: 05/19/20  3:01 PM   Specimen: Urine, Random  Result Value Ref Range Status   Specimen Description URINE, RANDOM  Final   Special Requests NONE  Final   Culture   Final    NO GROWTH Performed at [facility], [place] [address] [place], [place] [address]    Report Status 05/20/2020 FINAL  Final  MRSA PCR Screening     Status: None   Collection Time: 05/20/20 10:53 PM   Specimen: Nasopharyngeal  Result Value Ref Range Status   MRSA by PCR NEGATIVE NEGATIVE Final    Comment:        The GeneXpert MRSA Assay  (FDA approved for NASAL specimens only), is one component of a comprehensive MRSA colonization surveillance program. It is not intended to diagnose MRSA infection nor to guide or monitor treatment for MRSA infections. Performed at [facility], [place] [address], [place], [place] [address]     FURTHER DISCHARGE INSTRUCTIONS:  Get Medicines reviewed and adjusted: Please take all your medications with you for your next visit with your Primary MD  Laboratory/radiological data: Please request your Primary MD to go over all hospital tests and procedure/radiological results at the follow up, please ask your Primary MD to get all Hospital records sent to his/her office.  In some cases, they will be blood work, cultures and biopsy results pending at the time of your discharge. Please request that your primary care M.D. goes through all the records of your hospital data and follows up on these results.  Also Note the following: If you experience worsening of your admission symptoms, develop shortness of breath, life threatening emergency, suicidal or homicidal thoughts you must seek medical attention immediately by calling 911 or calling your MD immediately  if symptoms less severe.  You must read complete instructions/literature along with all the possible adverse reactions/side effects for all the Medicines you take and that have been prescribed to you. Take any new Medicines after you have completely understood and accpet all the possible adverse reactions/side effects.   Do not drive when taking Pain medications or sleeping medications (Benzodaizepines)  Do not take more than prescribed Pain, Sleep and Anxiety Medications. It  is not advisable to combine anxiety,sleep and pain medications without talking with your primary care practitioner  Special Instructions: If you have smoked or chewed Tobacco  in the last 2 yrs please stop smoking, stop any regular Alcohol  and or any Recreational  drug use.  Wear Seat belts while driving.  Please note: You were cared for by a hospitalist during your hospital stay. Once you are discharged, your primary care physician will handle any further medical issues. Please note that NO REFILLS for any discharge medications will be authorized once you are discharged, as it is imperative that you return to your primary care physician (or establish a relationship with a primary care physician if you do not have one) for your post hospital discharge needs so that they can reassess your need for medications and monitor your lab values.  Total Time spent coordinating discharge including counseling, education and face to face time equals 35 minutes.  SignedOren Binet 05/26/2020 10:26 AM

## 2020-05-26 NOTE — Progress Notes (Signed)
Inpatient Rehab Admissions Coordinator:  I have a bed available for pt to admit to CIR today.  Dr. Sloan Leiter in agreement.  Will let pt/family and NSG know.   Gayland Curry, [place], [place] Admissions Coordinator [phone]

## 2020-05-26 NOTE — Progress Notes (Signed)
   05/26/20 1213  What Happened  Was fall witnessed? No  Was patient injured? No  Patient found on floor;other (Comment) (Sitting on the floor)  Found by No one-pt stated they fell (Called out and said she fell)  Stated prior activity to/from bed, chair, or stretcher  Follow Up  MD notified Dr. Sloan Leiter  Time MD notified 1230  Family notified No - patient refusal (Pt said no need to notify)  Additional tests No  Progress note created (see row info) Yes  Adult Fall Risk Assessment  Risk Factor Category (scoring not indicated) History of more than one fall within 6 months before admission (document High fall risk)  Patient Fall Risk Level High fall risk  Adult Fall Risk Interventions  Required Bundle Interventions *See Row Information* High fall risk - low, moderate, and high requirements implemented  Additional Interventions Use of appropriate toileting equipment (bedpan, BSC, etc.)  Screening for Fall Injury Risk (To be completed on HIGH fall risk patients) - Assessing Need for Low Bed  Risk For Fall Injury- Low Bed Criteria Admitted as a result of a fall  Will Implement Low Bed and Floor Mats Low bed contraindicated, floor mats in place (Will transfer to CIR today)  Screening for Fall Injury Risk (To be completed on HIGH fall risk patients who do not meet crieteria for Low Bed) - Assessing Need for Floor Mats Only  Risk For Fall Injury- Criteria for Floor Mats Bleeding risk-anticoagulation (not prophylaxis)  Will Implement Floor Mats Yes  Vitals  Temp 98.1 F (36.7 C)  Temp Source Oral  BP (!) 155/107  MAP (mmHg) 122  BP Location Left Arm  BP Method Automatic  Patient Position (if appropriate) Sitting  Pulse Rate 67  Pulse Rate Source Monitor  ECG Heart Rate 68  Resp 15  Oxygen Therapy  SpO2 96 %  O2 Device Room Air  Pain Assessment  Pain Scale 0-10  Pain Score 3  Pain Type Chronic pain  Pain Location Back  Pain Orientation Lower  Pain Descriptors / Indicators  Aching (Better when sitting in chair)  Pain Frequency Constant  Pain Onset On-going  Pain Intervention(s) RN made aware  Neurological  Neuro (WDL) WDL  Level of Consciousness Alert  Orientation Level Oriented X4  Cognition Appropriate at baseline  Speech Clear  Pupil Assessment  No  Neuro Symptoms None  Musculoskeletal  Musculoskeletal (WDL) X  Assistive Device Front wheel walker  Generalized Weakness Yes  Weight Bearing Restrictions No  Integumentary  Integumentary (WDL) X  Skin Color Appropriate for ethnicity  Skin Condition Dry  Skin Integrity Intact;Ecchymosis  Ecchymosis Location Abdomen;Arm  Ecchymosis Location Orientation Right;Left

## 2020-05-26 NOTE — Plan of Care (Signed)

## 2020-05-26 NOTE — Progress Notes (Signed)
Occupational Therapy Treatment Patient Details Name: Nicole Norton MRN: [id number] DOB: 07/22/1941 Today's Date: 05/26/2020    History of present illness Nicole Norton is a 79 y.o. 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  Reportedly had been having falls at home.  History is somewhat difficult to obtain as the patient is in distress and altered.  Notes associated symptoms of difficulty breathing, chest pain, abdominal pain, lower extremity swelling, nausea, and abdominal pain.    OT comments  Patient up in chair on arrival.  She is very motivated and is showing improvement with therapy.  Today patient on 2L O2 (states this is baseline) and able to walk to sink with RW and supervision.  Stood at sink ~5 min to complete grooming and then needed to take a seated rest break due to fatigue.  Activity tolerance remains patient's biggest limitation.  Educated her on energy conservation techniques as she states she "pushes herself too hard" sometimes.  Will continue to follow with OT acutely to address the deficits listed below.    Follow Up Recommendations  CIR    Equipment Recommendations  3 in 1 bedside commode    Recommendations for Other Services      Precautions / Restrictions Precautions Precautions: Fall Restrictions Weight Bearing Restrictions: No       Mobility Bed Mobility Overal bed mobility: Needs Assistance             General bed mobility comments: pt sitting in recliner upon arrival  Transfers Overall transfer level: Needs assistance Equipment used: Rolling walker (2 wheeled) Transfers: Sit to/from Stand Sit to Stand:  Supervision              Balance Overall balance assessment: Needs assistance Sitting-balance support: No upper extremity supported;Feet supported Sitting balance-Leahy Scale: Good     Standing balance support: Bilateral upper extremity supported;During functional activity Standing balance-Leahy Scale: Fair Standing balance comment: Able to stand statically without external support                           ADL either performed or assessed with clinical judgement   ADL Overall ADL's : Needs assistance/impaired     Grooming: Oral care;Wash/dry hands;Wash/dry face;Supervision/safety;Standing                               Functional mobility during ADLs: Supervision/safety;Rolling walker       Vision       Perception     Praxis      Cognition Arousal/Alertness: Awake/alert Behavior During Therapy: WFL for tasks assessed/performed Overall Cognitive Status: Within Functional Limits for tasks assessed                                          Exercises     Shoulder Instructions       General Comments On 2L O2, SpO2 98 at rest, 87-94 with mobility and standing activity    Pertinent Vitals/ Pain       Pain Assessment: No/denies pain  Home Living  Prior Functioning/Environment              Frequency  Min 2X/week        Progress Toward Goals  OT Goals(current goals can now be found in the care plan section)  Progress towards OT goals: Progressing toward goals  Acute Rehab OT Goals Patient Stated Goal: Figure out how to go back home and have enough assist OT Goal Formulation: With patient Time For Goal Achievement: 06/05/20 Potential to Achieve Goals: Good  Plan Discharge plan remains appropriate    Co-evaluation                 AM-PAC OT "6 Clicks" Daily Activity     Outcome Measure   Help from another person eating meals?: A  Little Help from another person taking care of personal grooming?: A Little Help from another person toileting, which includes using toliet, bedpan, or urinal?: A Little Help from another person bathing (including washing, rinsing, drying)?: A Little Help from another person to put on and taking off regular upper body clothing?: A Little Help from another person to put on and taking off regular lower body clothing?: A Little 6 Click Score: 18    End of Session Equipment Utilized During Treatment: Rolling walker;Oxygen  OT Visit Diagnosis: Unsteadiness on feet (R26.81);Other abnormalities of gait and mobility (R26.89);Muscle weakness (generalized) (M62.81);Pain   Activity Tolerance Patient tolerated treatment well   Patient Left in chair;with call bell/phone within reach   Nurse Communication Mobility status        Time: 7494-4967 OT Time Calculation (min): 23 min  Charges: OT General Charges $OT Visit: 1 Visit OT Treatments $Self Care/Home Management : 23-37 mins  August Luz, OTR/L    Phylliss Bob 05/26/2020, 3:18 PM

## 2020-05-26 NOTE — Progress Notes (Signed)
Patient arrived  On the unit in wheelchair, she was admitted at 1520, A&O x4.  Denied pain at the time, assessment completed, vitals stable. Patient educated on POC, safety protocol. We continue to monitor.

## 2020-05-27 ENCOUNTER — Inpatient Hospital Stay (HOSPITAL_COMMUNITY): Payer: Medicare Other | Admitting: Occupational Therapy

## 2020-05-27 ENCOUNTER — Inpatient Hospital Stay (HOSPITAL_COMMUNITY): Payer: Medicare Other

## 2020-05-27 LAB — CBC WITH DIFFERENTIAL/PLATELET
Abs Immature Granulocytes: 0.23 10*3/uL — ABNORMAL HIGH (ref 0.00–0.07)
Basophils Absolute: 0 10*3/uL (ref 0.0–0.1)
Basophils Relative: 0 %
Eosinophils Absolute: 0.1 10*3/uL (ref 0.0–0.5)
Eosinophils Relative: 1 %
HCT: 35.8 % — ABNORMAL LOW (ref 36.0–46.0)
Hemoglobin: 11.6 g/dL — ABNORMAL LOW (ref 12.0–15.0)
Immature Granulocytes: 3 %
Lymphocytes Relative: 20 %
Lymphs Abs: 1.6 10*3/uL (ref 0.7–4.0)
MCH: 33.9 pg (ref 26.0–34.0)
MCHC: 32.4 g/dL (ref 30.0–36.0)
MCV: 104.7 fL — ABNORMAL HIGH (ref 80.0–100.0)
Monocytes Absolute: 0.7 10*3/uL (ref 0.1–1.0)
Monocytes Relative: 8 %
Neutro Abs: 5.2 10*3/uL (ref 1.7–7.7)
Neutrophils Relative %: 68 %
Platelets: 294 10*3/uL (ref 150–400)
RBC: 3.42 MIL/uL — ABNORMAL LOW (ref 3.87–5.11)
RDW: 13.7 % (ref 11.5–15.5)
WBC: 7.7 10*3/uL (ref 4.0–10.5)
nRBC: 0 % (ref 0.0–0.2)

## 2020-05-27 LAB — COMPREHENSIVE METABOLIC PANEL
ALT: 14 U/L (ref 0–44)
AST: 11 U/L — ABNORMAL LOW (ref 15–41)
Albumin: 2.7 g/dL — ABNORMAL LOW (ref 3.5–5.0)
Alkaline Phosphatase: 59 U/L (ref 38–126)
Anion gap: 11 (ref 5–15)
BUN: 28 mg/dL — ABNORMAL HIGH (ref 8–23)
CO2: 29 mmol/L (ref 22–32)
Calcium: 9.2 mg/dL (ref 8.9–10.3)
Chloride: 102 mmol/L (ref 98–111)
Creatinine, Ser: 1.85 mg/dL — ABNORMAL HIGH (ref 0.44–1.00)
GFR calc Af Amer: 30 mL/min — ABNORMAL LOW (ref >60)
GFR calc non Af Amer: 26 mL/min — ABNORMAL LOW (ref >60)
Glucose, Bld: 90 mg/dL (ref 70–99)
Potassium: 3.9 mmol/L (ref 3.5–5.1)
Sodium: 142 mmol/L (ref 135–145)
Total Bilirubin: 0.6 mg/dL (ref 0.3–1.2)
Total Protein: 5.4 g/dL — ABNORMAL LOW (ref 6.5–8.1)

## 2020-05-27 MED ORDER — OXYCODONE HCL 5 MG PO TABS
10.0000 mg | ORAL_TABLET | ORAL | Status: DC | PRN
  Administered 2020-05-27 – 2020-05-28 (×4): 10 mg via ORAL
  Filled 2020-05-27 (×4): qty 2

## 2020-05-27 MED ORDER — DOCUSATE SODIUM 100 MG PO CAPS
100.0000 mg | ORAL_CAPSULE | Freq: Two times a day (BID) | ORAL | Status: DC
  Administered 2020-05-27 – 2020-05-30 (×7): 100 mg via ORAL
  Filled 2020-05-27 (×7): qty 1

## 2020-05-27 NOTE — Progress Notes (Signed)
Patient ID: Nicole Norton, female   DOB: 04/21/1941, 79 y.o.   MRN: [id number]  SW met with pt in room to introduce self, explain role, updates from team conference, d/c date 7/16, and d/c recs: OPT PT/OT and RW;3in1 BSC. Pt states that he husband has support from his nurses that come into check in on him as he just had a pacemaker placed within the last few weeks. She also states that her friends are very supportive and are more than willing to assist her. Pt is willing to hire assistance to avoid being a burden to friends. SW discussed aide and attendant services since her husband is a English as a second language teacher and gets [employer] disability. SW to provide sitter list. Pt is open to [facility] or [facility] outpatient therapies. SW informed will follow-up to ensure they offer services she requires.   Loralee Pacas, MSW, [place] Office: [phone] Cell: [phone] Fax: [phone]

## 2020-05-27 NOTE — Progress Notes (Signed)
Inpatient Rehabilitation  Patient information reviewed and entered into eRehab system by Kentrell Hallahan M. Shanoah Asbill, M.A., CCC/SLP, PPS Coordinator.  Information including medical coding, functional ability and quality indicators will be reviewed and updated through discharge.    

## 2020-05-27 NOTE — Progress Notes (Signed)
[facility] PHYSICAL MEDICINE & REHABILITATION PROGRESS NOTE   Subjective/Complaints:   LBM yesterday- wants to take oxycodone at breakfast, lunch and dinner- so can be spaced closer together.   Also wants Colace BID- not prn.   Doing well otherwise  ROS:  Pt denies SOB, abd pain, CP, N/V/C/D, and vision changes   Objective:   No results found. Recent Labs    05/27/20 0610  WBC 7.7  HGB 11.6*  HCT 35.8*  PLT 294   Recent Labs    05/25/20 0721 05/27/20 0610  NA 141 142  K 4.4 3.9  CL 101 102  CO2 30 29  GLUCOSE 85 90  BUN 27* 28*  CREATININE 1.43* 1.85*  CALCIUM 9.4 9.2    Intake/Output Summary (Last 24 hours) at 05/27/2020 0958 Last data filed at 05/27/2020 0846 Gross per 24 hour  Intake 480 ml  Output --  Net 480 ml     Physical Exam: Vital Signs Blood pressure (!) 175/90, pulse 63, temperature 98 F (36.7 C), resp. rate 19, height 5\' 6"  (1.676 m), weight 109.5 kg, SpO2 96 %.  General: Apt sitting up in bedside chair, appropriate, NAD HEENT: conjugate gaze Heart: RRR - not in Afib this AM Chest: CTA B/L- no W/R/R- good air movement Abdomen: Soft, NT, ND, (+)BS  Extremities: No clubbing, cyanosis, or edema. Pulses are 2+ Skin: Bilateral feet cyanotic appearing. BUE with multiple ecchymotic lesions. 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  Neuro: Pt is cognitively appropriate with normal insight, memory, and awareness. Cranial nerves 2-12 are intact. Sensory exam is normal. Reflexes are 2+ in all 4's. Fine motor coordination is intact. No tremors. Motor function is grossly 5/5.  Musculoskeletal: Full ROM, No pain with AROM or PROM in the neck, trunk, or extremities. Posture appropriate Psych: pt appropriate   Assessment/Plan: 1. Functional deficits secondary to debility secondary to complicated hospital stay- multiple medical issues which require 3+ hours per day of interdisciplinary therapy in a comprehensive inpatient rehab  setting.  Physiatrist is providing close team supervision and 24 hour management of active medical problems listed below.  Physiatrist and rehab team continue to assess barriers to discharge/monitor patient progress toward functional and medical goals  Care Tool:  Bathing    Body parts bathed by patient: Right arm, Left arm, Chest, Abdomen, Front perineal area, Right upper leg, Left upper leg, Right lower leg, Left lower leg, Face (declined washing buttocks at time of eval)         Bathing assist Assist Level: Contact Guard/Touching assist     Upper Body Dressing/Undressing Upper body dressing   What is the patient wearing?: Hospital gown only    Upper body assist Assist Level: Supervision/Verbal cueing    Lower Body Dressing/Undressing Lower body dressing            Lower body assist       Toileting Toileting    Toileting assist Assist for toileting: Set up assist     Transfers Chair/bed transfer  Transfers assist           Locomotion Ambulation   Ambulation assist              Walk 10 feet activity   Assist     Assist level: Contact Guard/Touching assist Assistive device: Walker-rolling   Walk 50 feet activity   Assist           Walk 150 feet activity   Assist  Walk 10 feet on uneven surface  activity   Assist           Wheelchair     Assist               Wheelchair 50 feet with 2 turns activity    Assist            Wheelchair 150 feet activity     Assist          Blood pressure (!) 175/90, pulse 63, temperature 98 F (36.7 C), resp. rate 19, height 5\' 6"  (1.676 m), weight 109.5 kg, SpO2 96 %.  Medical Problem List and Plan: 1.  Impaired mobility and ADLs secondary to Debility due to multiple medical issues- ATN, Afib with RVR, CHF exacerbation, etc             -patient may shower             -ELOS/Goals: modI in PT and OT, I in SLP in 10-12 days 2.   Antithrombotics: -DVT/anticoagulation:  Pharmaceutical: Xarelto             -antiplatelet therapy: On Plavix. 3.  Polyneuropathy/pain Management: On Lyrica twice daily with oxycodone 10 prn for pain. Discussed potentially increasing the Lyrica for right leg radicular pain- can consider as kidney function returns to baseline. On Valium as needed spasms  7/13- changed oxycodone to 10 mg q4 hours not 6 hrs prn- max 4 doses/day- so can get closer together- at meals.  4. Mood: Currently in a positive mood.              -antipsychotic agents: N/A 5. Neuropsych: This patient is capable of making decisions on her own behalf. 6. Skin/Wound Care: Routine pressure relief measures.  7. Fluids/Electrolytes/Nutrition: Strict I/O. 1800 cc FR/day 8.  Chronic systolic CHF: Monitor for signs of overload.  Check weights daily.  Continue Bumex, metoprolol daily and Plavix. Filed Weights   05/26/20 1529 05/27/20 0427  Weight: 111.1 kg 109.5 kg    7/13- weight down 1.5 kg- doing well 9.  Chronic hypoxemic respiratory failure: Completed antibiotic course on 07/10.  To taper back to prednisone 20 mg daily starting 07/13.  oxygen dependent- 3 liters  7/13- sats 96% on 3L- con't meds 10.  PAF: Monitor heart rate twice daily--on Xarelto, metoprolol and amiodarone daily. Has been well controlled.  11.  Acute on chronic renal failure: Baseline SCr around 1.3 per records. Continue to monitor with serial checks as is slowly trending back down from 2.2-->1.43.    7/13- Cr bounced back to 1.85- will monitor- don't want to give fluids due to CHF, but will ask pt to push lfuids PO.  12.  Morbid obesity: Educate patient on importance of weight loss to help with mobility and medical issues.    LOS: 1 days A FACE TO FACE EVALUATION WAS PERFORMED  Neila Teem 05/27/2020, 9:58 AM
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
## 2020-05-27 NOTE — Patient Care Conference (Signed)
Inpatient RehabilitationTeam Conference and Plan of Care Update Date: 05/27/2020   Time: 4:51 PM    Patient Name: Nicole Norton      Medical Record Number: [id number]  Date of Birth: [birth year] Sex: Female         Room/Bed: 4M11C/4M11C-01 Payor Info: Payor: MEDICARE / Plan: MEDICARE PART A AND B / Product Type: *No Product type* /    Admit Date/Time:  05/26/2020  3:24 PM  Primary Diagnosis:  [facility] Problems: Principal Problem:   Debility    Expected Discharge Date: Expected Discharge Date: 05/30/20  Team Members Present: Physician leading conference: Dr. Courtney Heys Care Coodinator Present: Loralee Pacas, LCSWA;Ramona Slinger Creig Hines, RN, BSN, [name] Nurse Present: Serena Croissant, LPN PT Present: Tereasa Coop, PT OT Present: Lillia Corporal, OT PPS Coordinator present : Ileana Ladd, Burna Mortimer, SLP     Current Status/Progress Goal Weekly Team Focus  Bowel/Bladder   pt cont of bowel and bladder pt has urgency  remain cont of bowel and bladder  assess q shift and prn    Swallow/Nutrition/ Hydration             ADL's             Mobility             Communication             Safety/Cognition/ Behavioral Observations            Pain   pt c/o of pain in right hip and leg level 7/10  decrease pain to level 3/10  assess pain q shift and medicate prn and q shift   Skin   no skin breakdown  remain free of break down   assess skin q shift and prn      Team Discussion:  Discharge Planning/Teaching Needs:    TBD     Current Update:    Current Barriers to Discharge:  Decreased caregiver support and Lack of/limited family support  Possible Resolutions to Barriers: Patient is caregiver for husband, do have a housekeeper that comes in 3x per week.  Patient on target to meet rehab goals: yes, continent B/B, check PVR's, supervision to contact guard with OT. Ambulated 180' with RW supervision. Pt gets short of breath, on 3L O2 at home. Recommending  outpatient therapy.  *See Care Plan and progress notes for long and short-term goals.   Revisions to Treatment Plan:  none    Medical Summary Current Status: on 3L O2- home O2- continent B/B- LBM 7/11- skin good- wasn't c/o pain currently- check PVRS just to make sure no retention Weekly Focus/Goal: OT- go tin shower- is Supervision-CGA- SOB with overdoing it-  Barriers to Discharge: Home enviroment access/layout;Medical stability;Weight;Decreased family/caregiver support;Other (comments)  Barriers to Discharge Comments: is husband's caregiver- wants to d/c Friday- on Home O2 2-3L- has H/H nursing 3x/day and housekeeper 3x/week- 7/16 d/c Possible Resolutions to Barriers: PT- CGA- supervision- 180 ft RW- on O2-L knee feeling will give way- chronic   Continued Need for Acute Rehabilitation Level of Care: The patient requires daily medical management by a physician with specialized training in physical medicine and rehabilitation for the following reasons: Direction of a multidisciplinary physical rehabilitation program to maximize functional independence : Yes Medical management of patient stability for increased activity during participation in an intensive rehabilitation regime.: Yes Analysis of laboratory values and/or radiology reports with any subsequent need for medication adjustment and/or medical intervention. : Yes   I attest that I was  present, lead the team conference, and concur with the assessment and plan of the team.   Cristi Loron 05/27/2020, 4:51 PM

## 2020-05-27 NOTE — Plan of Care (Signed)
  Problem: RH Balance Goal: LTG: Patient will maintain dynamic sitting balance (OT) Description: LTG:  Patient will maintain dynamic sitting balance with assistance during activities of daily living (OT) Flowsheets (Taken 05/27/2020 1230) LTG: Pt will maintain dynamic sitting balance during ADLs with: Independent Goal: LTG Patient will maintain dynamic standing with ADLs (OT) Description: LTG:  Patient will maintain dynamic standing balance with assist during activities of daily living (OT)  Flowsheets (Taken 05/27/2020 1230) LTG: Pt will maintain dynamic standing balance during ADLs with: Independent with assistive device   Problem: Sit to Stand Goal: LTG:  Patient will perform sit to stand in prep for activites of daily living with assistance level (OT) Description: LTG:  Patient will perform sit to stand in prep for activites of daily living with assistance level (OT) Flowsheets (Taken 05/27/2020 1230) LTG: PT will perform sit to stand in prep for activites of daily living with assistance level: Independent with assistive device   Problem: RH Grooming Goal: LTG Patient will perform grooming w/assist,cues/equip (OT) Description: LTG: Patient will perform grooming with assist, with/without cues using equipment (OT) Flowsheets (Taken 05/27/2020 1230) LTG: Pt will perform grooming with assistance level of: Independent with assistive device    Problem: RH Bathing Goal: LTG Patient will bathe all body parts with assist levels (OT) Description: LTG: Patient will bathe all body parts with assist levels (OT) Flowsheets (Taken 05/27/2020 1230) LTG: Pt will perform bathing with assistance level/cueing: Independent with assistive device    Problem: RH Dressing Goal: LTG Patient will perform upper body dressing (OT) Description: LTG Patient will perform upper body dressing with assist, with/without cues (OT). Flowsheets (Taken 05/27/2020 1230) LTG: Pt will perform upper body dressing with assistance  level of: Independent with assistive device Goal: LTG Patient will perform lower body dressing w/assist (OT) Description: LTG: Patient will perform lower body dressing with assist, with/without cues in positioning using equipment (OT) Flowsheets (Taken 05/27/2020 1230) LTG: Pt will perform lower body dressing with assistance level of: Independent with assistive device   Problem: RH Toileting Goal: LTG Patient will perform toileting task (3/3 steps) with assistance level (OT) Description: LTG: Patient will perform toileting task (3/3 steps) with assistance level (OT)  Flowsheets (Taken 05/27/2020 1230) LTG: Pt will perform toileting task (3/3 steps) with assistance level: Independent with assistive device   Problem: RH Simple Meal Prep Goal: LTG Patient will perform simple meal prep w/assist (OT) Description: LTG: Patient will perform simple meal prep with assistance, with/without cues (OT). Flowsheets (Taken 05/27/2020 1230) LTG: Pt will perform simple meal prep with assistance level of: Independent with assistive device   Problem: RH Toilet Transfers Goal: LTG Patient will perform toilet transfers w/assist (OT) Description: LTG: Patient will perform toilet transfers with assist, with/without cues using equipment (OT) Flowsheets (Taken 05/27/2020 1230) LTG: Pt will perform toilet transfers with assistance level of: Independent with assistive device   Problem: RH Tub/Shower Transfers Goal: LTG Patient will perform tub/shower transfers w/assist (OT) Description: LTG: Patient will perform tub/shower transfers with assist, with/without cues using equipment (OT) Flowsheets (Taken 05/27/2020 1230) LTG: Pt will perform tub/shower stall transfers with assistance level of: Independent with assistive device

## 2020-05-27 NOTE — Plan of Care (Signed)
°  Problem: Consults Goal: RH GENERAL PATIENT EDUCATION Description: See Patient Education module for education specifics. Outcome: Progressing   Problem: RH SAFETY Goal: RH STG ADHERE TO SAFETY PRECAUTIONS W/ASSISTANCE/DEVICE Description: STG Adhere to Safety Precautions With Mod IAssistance/Device. Outcome: Progressing Goal: RH STG DECREASED RISK OF FALL WITH ASSISTANCE Description: STG Decreased Risk of Fall With Mod I Assistance. Outcome: Progressing   Problem: RH PAIN MANAGEMENT Goal: RH STG PAIN MANAGED AT OR BELOW PT'S PAIN GOAL Description: <2 Outcome: Progressing

## 2020-05-28 ENCOUNTER — Inpatient Hospital Stay (HOSPITAL_COMMUNITY): Payer: Medicare Other | Admitting: Occupational Therapy

## 2020-05-28 ENCOUNTER — Inpatient Hospital Stay (HOSPITAL_COMMUNITY): Payer: Medicare Other | Admitting: Physical Therapy

## 2020-05-28 ENCOUNTER — Inpatient Hospital Stay (HOSPITAL_COMMUNITY): Payer: Medicare Other

## 2020-05-28 LAB — BASIC METABOLIC PANEL
Anion gap: 10 (ref 5–15)
BUN: 27 mg/dL — ABNORMAL HIGH (ref 8–23)
CO2: 30 mmol/L (ref 22–32)
Calcium: 9.4 mg/dL (ref 8.9–10.3)
Chloride: 100 mmol/L (ref 98–111)
Creatinine, Ser: 1.68 mg/dL — ABNORMAL HIGH (ref 0.44–1.00)
GFR calc Af Amer: 34 mL/min — ABNORMAL LOW (ref >60)
GFR calc non Af Amer: 29 mL/min — ABNORMAL LOW (ref >60)
Glucose, Bld: 94 mg/dL (ref 70–99)
Potassium: 3.9 mmol/L (ref 3.5–5.1)
Sodium: 140 mmol/L (ref 135–145)

## 2020-05-28 MED ORDER — OXYCODONE HCL 5 MG PO TABS
10.0000 mg | ORAL_TABLET | Freq: Three times a day (TID) | ORAL | Status: DC
  Administered 2020-05-28 – 2020-05-30 (×7): 10 mg via ORAL
  Filled 2020-05-28 (×7): qty 2
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
## 2020-05-28 NOTE — Progress Notes (Signed)
Patient ID: Nicole Norton, female   DOB: 04/08/1941, 79 y.o.   MRN: [id number]   DME order: 3in1 BSC and RW ordered with [facility] via [place].  [facility] does not offer outpatient OT only PT.   SW sent outpatient PT/OT order to [facility] (p:[phone]/f:[phone]).   Loralee Pacas, MSW, [place] Office: [phone] Cell: [phone] Fax: [phone]

## 2020-05-28 NOTE — Progress Notes (Signed)
Occupational Therapy Session Note  Patient Details  Name: Nicole Norton MRN: [id number] Date of Birth: Aug 07, 1941  Today's Date: 05/28/2020 OT Individual Time: 7711-6579 OT Individual Time Calculation (min): 46 min    Short Term Goals: Week 1:  OT Short Term Goal 1 (Week 1): STGs = LTGs due to ELOS  Skilled Therapeutic Interventions/Progress Updates:    Pt greeted at time of session sitting up in wheelchair agreeable to OT session, no c/o pain throughout. Collaborated with pt regarding DC planning, follow up with OPOT, and assistance she will have at home. Pt ambulated from room to apartment with Supervision with RW and performed various tranfers to sofa and wheelchair with supervision, receptive to compensatory and energy conservation techniques. IADL/home management education in kitchen with placement of pots/pans/frequenctly used items and safe techniques for item retrieval. Pt brought back to room via wheelchair for time management and set up with alarm on, call bell in reach, all needs met.   Therapy Documentation Precautions:  Precautions Precautions: Fall Restrictions Weight Bearing Restrictions: No Other Position/Activity Restrictions: On 2L Home Oxygen    Therapy/Group: Individual Therapy  Viona Gilmore 05/28/2020, 3:55 PM

## 2020-05-28 NOTE — Plan of Care (Signed)
  Problem: Consults Goal: RH GENERAL PATIENT EDUCATION Description: See Patient Education module for education specifics. Outcome: Progressing   Problem: RH SAFETY Goal: RH STG ADHERE TO SAFETY PRECAUTIONS W/ASSISTANCE/DEVICE Description: STG Adhere to Safety Precautions With Mod IAssistance/Device. Outcome: Progressing Goal: RH STG DECREASED RISK OF FALL WITH ASSISTANCE Description: STG Decreased Risk of Fall With Mod I Assistance. Outcome: Progressing   Problem: RH PAIN MANAGEMENT Goal: RH STG PAIN MANAGED AT OR BELOW PT'S PAIN GOAL Description: <2 Outcome: Progressing

## 2020-05-28 NOTE — Progress Notes (Signed)
[facility] PHYSICAL MEDICINE & REHABILITATION PROGRESS NOTE   Subjective/Complaints: No complaints this morning. Asks that oxycodone be given during meals and at night- she uses 3-4 times per day- will refuse nighttime dose if she does not need.   ROS:  Pt denies SOB, abd pain, CP, N/V/C/D, and vision changes  Objective:   No results found. Recent Labs    05/27/20 0610  WBC 7.7  HGB 11.6*  HCT 35.8*  PLT 294   Recent Labs    05/27/20 0610 05/28/20 0738  NA 142 140  K 3.9 3.9  CL 102 100  CO2 29 30  GLUCOSE 90 94  BUN 28* 27*  CREATININE 1.85* 1.68*  CALCIUM 9.2 9.4    Intake/Output Summary (Last 24 hours) at 05/28/2020 1944 Last data filed at 05/28/2020 1850 Gross per 24 hour  Intake 690 ml  Output --  Net 690 ml     Physical Exam: Vital Signs Blood pressure 124/73, pulse 63, temperature 98.2 F (36.8 C), temperature source Oral, resp. rate 18, height 5\' 6"  (1.676 m), weight 109.3 kg, SpO2 98 %.General: Alert and oriented x 3, No apparent distress HEENT: Head is normocephalic, atraumatic, PERRLA, EOMI, sclera anicteric, oral mucosa pink and moist, dentition intact, ext ear canals clear,  Neck: Supple without JVD or lymphadenopathy Heart: Reg rate and rhythm. No murmurs rubs or gallops Chest: CTA bilaterally without wheezes, rales, or rhonchi; no distress Abdomen: Soft, non-tender, non-distended, bowel sounds positive. Extremities: No clubbing, cyanosis, or edema. Pulses are 2+ Skin: Bilateral feet cyanotic appearing. BUE with multiple ecchymotic lesions. 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  Neuro: Pt is cognitively appropriate with normal insight, memory, and awareness. Cranial nerves 2-12 are intact. Sensory exam is normal. Reflexes are 2+ in all 4's. Fine motor coordination is intact. No tremors. Motor function is grossly 5/5.  Musculoskeletal: Full ROM, No pain with AROM or PROM in the neck, trunk, or extremities. Posture  appropriate Psych: pt appropriate  Assessment/Plan: 1. Functional deficits secondary to debility secondary to complicated hospital stay- multiple medical issues which require 3+ hours per day of interdisciplinary therapy in a comprehensive inpatient rehab setting.  Physiatrist is providing close team supervision and 24 hour management of active medical problems listed below.  Physiatrist and rehab team continue to assess barriers to discharge/monitor patient progress toward functional and medical goals  Care Tool:  Bathing    Body parts bathed by patient: Right arm, Left arm, Chest, Abdomen, Front perineal area, Right upper leg, Left upper leg, Right lower leg, Left lower leg, Face (declined washing buttocks at time of eval)         Bathing assist Assist Level: Contact Guard/Touching assist     Upper Body Dressing/Undressing Upper body dressing   What is the patient wearing?: Pull over shirt    Upper body assist Assist Level: Set up assist    Lower Body Dressing/Undressing Lower body dressing      What is the patient wearing?: Underwear/pull up, Pants     Lower body assist Assist for lower body dressing: Independent     Toileting Toileting    Toileting assist Assist for toileting: Independent with assistive device Assistive Device Comment: walker   Transfers Chair/bed transfer  Transfers assist     Chair/bed transfer assist level: Supervision/Verbal cueing     Locomotion Ambulation   Ambulation assist      Assist level: Supervision/Verbal cueing Assistive device: Walker-rolling Max distance: 200'   Walk 10 feet activity  Assist     Assist level: Supervision/Verbal cueing Assistive device: Walker-rolling   Walk 50 feet activity   Assist    Assist level: Supervision/Verbal cueing Assistive device: Walker-rolling    Walk 150 feet activity   Assist    Assist level: Supervision/Verbal cueing Assistive device: Walker-rolling     Walk 10 feet on uneven surface  activity   Assist     Assist level: Supervision/Verbal cueing Assistive device: Walker-rolling   Wheelchair     Assist Will patient use wheelchair at discharge?: No             Wheelchair 50 feet with 2 turns activity    Assist            Wheelchair 150 feet activity     Assist          Blood pressure 124/73, pulse 63, temperature 98.2 F (36.8 C), temperature source Oral, resp. rate 18, height 5\' 6"  (1.676 m), weight 109.3 kg, SpO2 98 %.  Medical Problem List and Plan: 1.  Impaired mobility and ADLs secondary to Debility due to multiple medical issues- ATN, Afib with RVR, CHF exacerbation, etc             -patient may shower             -ELOS/Goals: modI in PT and OT, I in SLP in 10-12 days  -Continue CIR 2.  Antithrombotics: -DVT/anticoagulation:  Pharmaceutical: Xarelto             -antiplatelet therapy: On Plavix. 3.  Polyneuropathy/pain Management: On Lyrica twice daily with oxycodone 10 prn for pain. Discussed potentially increasing the Lyrica for right leg radicular pain- can consider as kidney function returns to baseline. On Valium as needed spasms  7/14: scheduled oxycodone AC/HS as she takes at home.  4. Mood: Currently in a positive mood.              -antipsychotic agents: N/A 5. Neuropsych: This patient is capable of making decisions on her own behalf. 6. Skin/Wound Care: Routine pressure relief measures.  7. Fluids/Electrolytes/Nutrition: Strict I/O. 1800 cc FR/day 8.  Chronic systolic CHF: Monitor for signs of overload.  Check weights daily.  Continue Bumex, metoprolol daily and Plavix. Filed Weights   05/26/20 1529 05/27/20 0427 05/28/20 0345  Weight: 111.1 kg 109.5 kg 109.3 kg    7/14: weight is stable. 9.  Chronic hypoxemic respiratory failure: Completed antibiotic course on 07/10.  To taper back to prednisone 20 mg daily starting 07/13.  oxygen dependent- 3 liters  7/13- sats 96% on 3L- con't  meds 10.  PAF: Monitor heart rate twice daily--on Xarelto, metoprolol and amiodarone daily. Has been well controlled.  11.  Acute on chronic renal failure: Baseline SCr around 1.3 per records. Continue to monitor with serial checks as is slowly trending back down from 2.2-->1.43.    7/13- Cr bounced back to 1.71- will monitor- don't want to give fluids due to CHF, but will ask pt to push lfuids PO.  7/14: Cr better controlled.  12.  Morbid obesity: Educate patient on importance of weight loss to help with mobility and medical issues.   LOS: 2 days A FACE TO FACE EVALUATION WAS PERFORMED  Izora Ribas 05/28/2020, 7:44 PM

## 2020-05-28 NOTE — Progress Notes (Signed)
Physical Therapy Session Note  Patient Details  Name: Nicole Norton MRN: [id number] Date of Birth: 12-20-1940  Today's Date: 05/28/2020 PT Individual Time: 1002-1059 PT Individual Time Calculation (min): 57 min   Short Term Goals: Week 1:  PT Short Term Goal 1 (Week 1): STGs = LTS due to ELOS  Skilled Therapeutic Interventions/Progress Updates:     Pt received seated in WC and agreeable to therapy. Reports pain in R knee and L hip. Number not provided. PT provides rest breaks as needed and core strengthening activities to manage pain symptoms.  Pt ambulates 200' x2 with RW and close supervision. PT provides verbal cues for upright gaze to improve posture and balance and decreasing WB through RW for energy conservation. Pt verbalizes feeling that R knee will give out as she fatigues.  Pt performs standing balance and strengthening activities in // bars. Facing mirror pt marches with high knees, with thighs touching bar to improve hip flexion strength and SLS balance. Pt then perform x20 heel raises. Pt verbalizes increase in low back/hip discomfort so PT adjusts session to focus on pain management. Pt transfers to mat with supervision for safety and performs quadruped activity, reaching alternating UEs forward for core strengthening. Pt then transitions to semifowler's position for comfort and performs alternating hip flexion and UE extension, pressing into thigh with contralateral hand for core engagement.   Pt left seated in WC with alarm intact and all needs within reach.  Therapy Documentation Precautions:  Precautions Precautions: Fall Restrictions Weight Bearing Restrictions: No Other Position/Activity Restrictions: On 2L Home Oxygen   Therapy/Group: Individual Therapy  Breck Coons, PT, DPT 05/28/2020, 4:22 PM

## 2020-05-28 NOTE — Progress Notes (Signed)
Physical Therapy Session Note  Patient Details  Name: Nicole Norton MRN: [id number] Date of Birth: 21-Jul-1941  Today's Date: 05/28/2020 PT Individual Time: 0802-0830 and 1433-1530 PT Individual Time Calculation (min): 28 min and 57 min  Short Term Goals: Week 1:  PT Short Term Goal 1 (Week 1): STGs = LTS due to ELOS  Skilled Therapeutic Interventions/Progress Updates:   First session:  Pt presents sitting up in bed, agreeable to therapy.  Pt performed sup to sit and sit to stand w/ supervision, but does require verbal cues for safety d/t speed. Pt able to don pants in seated position, Figure-4 position to don shoes, and leans forward to tie shoes and then stood to pull up pants w/o LOB.   Pt amb multiple trials w/ RW up to 40' including turns to return to seat.  O2 sats monitored on 2 LPM at 95%.  Pt required sated rest breaks 2/2 fatigue.  Pt remained in w/c w/ chair alarm on and all needs in reach.  Second session:  Pt presents sitting in w/c and agreeable to therapy.  Pt wheeled self to gym using BUEs and occasional LEs w/ supervision and verbal cues for sequencing.  Pt performed multiple standing balance activities to decrease risk of falls.  Pt performed standing hanging horseshoes off basketball hoop, crossing midline and reaching outside of BOS.  Pt also performed while standing w/ unilateral LE on 3 3/3" platform, w/ 1 episode of R knee "buckling' slightly.  Pt performed standing on cushioned surface and moving cones from table to L to table on R and vice-versa.  Pt performed tapping yellow T-ball from R table to left, as well as lifting it overhead in between.  Pt requires verbal cueing for speed and safety as pt states "it's hard for me to slow down."  Pt amb multiple trials w/ RW and CGA w/ verbal cues for posture and maintaining walker.  ! Episode of "scuffing" R toes and encouraged to maintain posture.  Pt returned to room and transferred sit to supine on bed w/ Independence.   Bed alarm on and all needs in reach.       Therapy Documentation Precautions:  Precautions Precautions: Fall Restrictions Weight Bearing Restrictions: No Other Position/Activity Restrictions: On 2L Home Oxygen General:   Vital Signs:  Pain: 5/10 LB and RLE.     Therapy/Group: Individual Therapy  Ladoris Gene 05/28/2020, 8:39 AM

## 2020-05-29 ENCOUNTER — Inpatient Hospital Stay (HOSPITAL_COMMUNITY): Payer: Medicare Other | Admitting: Occupational Therapy

## 2020-05-29 ENCOUNTER — Inpatient Hospital Stay (HOSPITAL_COMMUNITY): Payer: Medicare Other

## 2020-05-29 MED ORDER — LANSOPRAZOLE 30 MG PO CPDR: 30.0000 mg | DELAYED_RELEASE_CAPSULE | Freq: Every day | ORAL | 3 refills | Status: DC

## 2020-05-29 MED ORDER — ACETAMINOPHEN 325 MG PO TABS: 325.0000 mg | ORAL_TABLET | ORAL | Status: DC | PRN

## 2020-05-29 MED ORDER — PROSOURCE PLUS PO LIQD
30.0000 mL | Freq: Two times a day (BID) | ORAL | Status: DC
  Administered 2020-05-29: 30 mL via ORAL
  Filled 2020-05-29 (×3): qty 30

## 2020-05-29 MED ORDER — METOPROLOL SUCCINATE ER 25 MG PO TB24: 12.5000 mg | ORAL_TABLET | Freq: Every day | ORAL | 2 refills | Status: DC

## 2020-05-29 MED ORDER — CLOPIDOGREL BISULFATE 75 MG PO TABS: 75.0000 mg | ORAL_TABLET | Freq: Every day | ORAL | 0 refills | Status: DC

## 2020-05-29 MED ORDER — DOCUSATE SODIUM 100 MG PO CAPS: 100.0000 mg | ORAL_CAPSULE | Freq: Two times a day (BID) | ORAL | 0 refills | Status: DC

## 2020-05-29 MED ORDER — PREGABALIN 75 MG PO CAPS: 75.0000 mg | ORAL_CAPSULE | Freq: Two times a day (BID) | ORAL | Status: AC

## 2020-05-29 MED ORDER — GUAIFENESIN ER 600 MG PO TB12: 600.0000 mg | ORAL_TABLET | Freq: Two times a day (BID) | ORAL | 0 refills | Status: DC

## 2020-05-29 NOTE — Progress Notes (Signed)
Physical Therapy Discharge Summary  Patient Details  Name: Nicole Norton MRN: [id number] Date of Birth: 06-19-1941  Today's Date: 05/29/2020 PT Individual Time: 1105-1202 PT Individual Time Calculation (min): 57 min    Patient has met 9 of 9 long term goals due to improved activity tolerance, improved balance and increased strength.  Patient to discharge at an ambulatory level Modified Independent.   Patient's is modified independent and will not require physical assistance at discharge.  Reasons goals not met: NA  Recommendation:  Patient will benefit from ongoing skilled PT services in outpatient setting to continue to advance safe functional mobility, address ongoing impairments in strength, balance, ambulation, and minimize fall risk.  Equipment: RW  Reasons for discharge: treatment goals met and discharge from hospital  Patient/family agrees with progress made and goals achieved: Yes   Skilled Therapeutic Interventions Pt received seated in [facility] and agreeable to therapy. Reports pain in R hip and L knee. Number not provided. PT provides rest breaks as needed to manage pain symptoms. Pt performs WC<>bed transfer and sit<>supine at mod(I), using elevated HOB, per her home setup. Pt ambulates 100' with RW and mod(I). Gait with increased WB through RW due to knee pain and decreased speed. Car transfer and ramp navigation with RW and mod(I). Extended seated rest break prior to ambulating 125' with RW. Pt performs x12 steps with BHR and mod(I). PT educates pt on continued mobility at home to increase functional strength and endurance. Pt ambulates 200' back to room with RW. Left seated at EOB with alarm intact and all needs within reach.  PT Discharge Precautions/Restrictions Precautions Precautions: Fall Restrictions Weight Bearing Restrictions: No Other Position/Activity Restrictions: On 2L Home Oxygen Vision/Perception  Perception Perception: Within Functional  Limits Praxis Praxis: Intact  Cognition Overall Cognitive Status: Within Functional Limits for tasks assessed Arousal/Alertness: Awake/alert Orientation Level: Oriented X4 Safety/Judgment: Appears intact Sensation Sensation Light Touch: Impaired by gross assessment (slightly decreased in RLE) Coordination Gross Motor Movements are Fluid and Coordinated: Yes Fine Motor Movements are Fluid and Coordinated: Yes Motor  Motor Motor: Within Functional Limits  Mobility Bed Mobility Bed Mobility: Supine to Sit;Sit to Supine Supine to Sit: Independent with assistive device Sit to Supine: Independent with assistive device Transfers Transfers: Sit to Stand;Stand to Sit;Stand Pivot Transfers Sit to Stand: Independent with assistive device Stand to Sit: Independent with assistive device Stand Pivot Transfers: Independent with assistive device Transfer (Assistive device): Rolling walker Locomotion  Gait Ambulation: Yes Gait Assistance: Independent with assistive device Gait Distance (Feet): 200 Feet Assistive device: Rolling walker Gait Gait: Yes Gait Pattern: Impaired Gait Pattern: Decreased stride length (increased WB through RW) Gait velocity: Decreased Stairs / Additional Locomotion Stairs: Yes Stairs Assistance: Independent with assistive device Stair Management Technique: Two rails Number of Stairs: 12 Height of Stairs: 6 Ramp: Independent with assistive device Curb: Independent with assistive device Wheelchair Mobility Wheelchair Mobility: No  Trunk/Postural Assessment  Cervical Assessment Cervical Assessment: Exceptions to [facility] (forward head) Thoracic Assessment Thoracic Assessment: Exceptions to [facility] (rounded shoulders) Lumbar Assessment Lumbar Assessment: Exceptions to [facility] (posterior pelvic tilt) Postural Control Postural Control: Within Functional Limits  Balance Balance Balance Assessed: Yes Static Sitting Balance Static Sitting - Balance Support: Feet  supported Static Sitting - Level of Assistance: 6: Modified independent (Device/Increase time) Dynamic Sitting Balance Dynamic Sitting - Balance Support: Feet supported Dynamic Sitting - Level of Assistance: 7: Independent Static Standing Balance Static Standing - Balance Support: During functional activity Static Standing - Level of Assistance: 6: Modified  independent (Device/Increase time) Dynamic Standing Balance Dynamic Standing - Balance Support: During functional activity Dynamic Standing - Level of Assistance: 6: Modified independent (Device/Increase time) Extremity Assessment  RLE Assessment General Strength Comments: Hip Flexion 3+/5, otherwise grossly 4/5 LLE Assessment General Strength Comments: Hip Flexion 3+/5, Otherwise grossly 4/5    William C Allen, PT, DPT 05/29/2020, 4:22 PM  

## 2020-05-29 NOTE — Progress Notes (Signed)
Patient ID: Nicole Norton, female   DOB: 07-Jan-1941, 79 y.o.   MRN: [id number]  SW received updates from OT that pt received phone call indicating that [name] was unable to accept referral. OT reports pt can have just PT services. Pt would like PT at [name] location if possible.   SW spoke with [name] (p:[phone]/f:[phone]) who reported they were not taking any new patients. SW faxed referral to [name] [name] (p:[phone]/f:[phone]).  Loralee Pacas, MSW, [place] Office: [phone] Cell: [phone] Fax: [phone]

## 2020-05-29 NOTE — Progress Notes (Signed)
Occupational Therapy Discharge Summary  Patient Details  Name: Nicole Norton MRN: [id number] Date of Birth: [birth year]  Today's Date: 05/29/2020 OT Individual Time: 7654-6503 and 5465-6812 OT Individual Time Calculation (min): 55 min and 32 min   Patient has met 10 of 11 long term goals due to improved activity tolerance, improved balance, ability to compensate for deficits and improved awareness.  Patient to discharge at overall Modified Independent level.  Patient's care partner is independent to provide the necessary physical assistance at discharge.  The pt is able to complete all BADLs with Mod I with use of long handled sponge and RW. Pt is to DC home with husband to minimally assist as needed and friend/housekeeper is also very supportive and helpful with IADLs such as cleaning, errands, etc.  Reasons goals not met: Not met meal prep, simulated activity but did not fully do IADL task with the pt.   Recommendation:  Patient will benefit from ongoing skilled OT services in outpatient setting (if able, note that her preferred outpatient location does not have an OT available at this time so reviewed BUE strengthening for pt to perform at home) to continue to advance functional skills in the area of BADL and iADL.  Equipment: BSC  Reasons for discharge: treatment goals met and discharge from hospital  Patient/family agrees with progress made and goals achieved: Yes   Skilled Interventions: Session 1: Pt greeted at time of session sitting up in wheelchair agreeable to OT session but first had questions about DC, follow up with outpatient, etc. Discussed with SW and pt regarding outpatient follow up and pt is going to f/u PT only at her preferred outpatient location. BUE strengthening exercises with 3# dumbbells for strengthening in all planes, pt with demonstration and good form. Pt has 5# at home but recommended 3# to start and pt in agreement. Reviewed with theraband as well for  ease of BUE strengthening at home. IADL task of functional mobility throughout her room with RW Mod I to gather personal items, lay out clothing for tomorrow, etc. With bending and reaching for items at floor level as well. Pt able to independently take rest breaks as needed. Pt up in chair with alarm on, call bell in reach, all needs met.  Session 2: Pt greeted at time of session reclined in bed stating she was sore from all the activity/walking recently but very pleasant and agreeable to OT session for entire ADL. Pt ambulated short distance to bathroom where she completed 3/3 toileting tasks Mod I. Pt then walked to shower with Mod I with RW and completed UB/LB bathing Mod I. Pt picked out her soap and clothing as well prior to shower with using AE for shower seat/bench and long handled sponge to compensate to reach feet. Pt able to take rest breaks to pace herself to prevent SOB. Pt dried off in the same manner and performed UB/LB dressing Mod I before ambulating back to her bed. Sit to supine in bed Mod I without assistance and pt comfortable in bed with alarm on, call bell in reach, all needs met and aware she will be DC home tomorrow.   OT Discharge Precautions/Restrictions  Precautions Precautions: Fall Restrictions Weight Bearing Restrictions: No Other Position/Activity Restrictions: On 2L Home Oxygen Vital Signs Therapy Vitals BP: 120/75 Pain Pain Assessment Pain Scale: 0-10 Pain Score: 0-No pain ADL ADL Eating: Independent Grooming: Modified independent Upper Body Bathing: Modified independent Where Assessed-Upper Body Bathing: Shower Lower Body Bathing: Modified independent Where Assessed-Lower  Body Bathing: Shower Upper Body Dressing: Modified independent (Device) Lower Body Dressing: Modified independent Toileting: Modified independent Where Assessed-Toileting: Glass blower/designer: Diplomatic Services operational officer Method: Human resources officer: Modified  independent Clinical cytogeneticist Method: Magazine features editor: Modified independent Social research officer, government Method: Heritage manager: Gaffer Baseline Vision/History: Wears glasses Patient Visual Report: No change from baseline Perception  Perception: Within Functional Limits Praxis Praxis: Intact Cognition Overall Cognitive Status: Within Functional Limits for tasks assessed Arousal/Alertness: Awake/alert Orientation Level: Oriented X4 Memory: Appears intact Safety/Judgment: Appears intact Sensation Sensation Light Touch: Impaired by gross assessment Coordination Gross Motor Movements are Fluid and Coordinated: Yes Fine Motor Movements are Fluid and Coordinated: Yes Motor  Motor Motor: Within Functional Limits Mobility  Bed Mobility Bed Mobility: Supine to Sit;Sit to Supine Supine to Sit: Independent with assistive device Sit to Supine: Independent with assistive device Transfers Sit to Stand: Independent with assistive device Stand to Sit: Independent with assistive device  Trunk/Postural Assessment  Cervical Assessment Cervical Assessment: Exceptions to [facility] Thoracic Assessment Thoracic Assessment: Exceptions to [facility] Lumbar Assessment Lumbar Assessment: Exceptions to [facility] Postural Control Postural Control: Within Functional Limits  Balance Balance Balance Assessed: Yes Static Sitting Balance Static Sitting - Balance Support: Feet supported Static Sitting - Level of Assistance: 6: Modified independent (Device/Increase time) Dynamic Sitting Balance Dynamic Sitting - Balance Support: Feet supported Dynamic Sitting - Level of Assistance: 7: Independent Static Standing Balance Static Standing - Balance Support: During functional activity Static Standing - Level of Assistance: 6: Modified independent (Device/Increase time) Dynamic Standing Balance Dynamic Standing - Balance Support: During functional  activity Dynamic Standing - Level of Assistance: 6: Modified independent (Device/Increase time) Extremity/Trunk Assessment RUE Assessment RUE Assessment: Within Functional Limits LUE Assessment LUE Assessment: Within Functional Limits   Viona Gilmore 05/29/2020, 9:56 AM

## 2020-05-29 NOTE — Progress Notes (Signed)
[facility] PHYSICAL MEDICINE & REHABILITATION PROGRESS NOTE   Subjective/Complaints: No complaints this morning. She takes Oxycodone AC/HS now and slept and felt much better with this.    ROS:  Pt denies SOB, abd pain, CP, N/V/C/D, and vision changes  Objective:   No results found. Recent Labs    05/27/20 0610  WBC 7.7  HGB 11.6*  HCT 35.8*  PLT 294   Recent Labs    05/27/20 0610 05/28/20 0738  NA 142 140  K 3.9 3.9  CL 102 100  CO2 29 30  GLUCOSE 90 94  BUN 28* 27*  CREATININE 1.85* 1.68*  CALCIUM 9.2 9.4    Intake/Output Summary (Last 24 hours) at 05/29/2020 0913 Last data filed at 05/29/2020 0735 Gross per 24 hour  Intake 560 ml  Output --  Net 560 ml     Physical Exam: Vital Signs Blood pressure 120/75, pulse (!) 59, temperature 97.7 F (36.5 C), temperature source Oral, resp. rate 16, height 5\' 6"  (1.676 m), weight 107.7 kg, SpO2 95 %. General: Alert and oriented x 3, No apparent distress HEENT: Head is normocephalic, atraumatic, PERRLA, EOMI, sclera anicteric, oral mucosa pink and moist, dentition intact, ext ear canals clear,  Neck: Supple without JVD or lymphadenopathy Heart: Reg rate and rhythm. No murmurs rubs or gallops Chest: CTA bilaterally without wheezes, rales, or rhonchi; no distress Abdomen: Soft, non-tender, non-distended, bowel sounds positive. Extremities: No clubbing, cyanosis, or edema. Pulses are 2+ Skin: Bilateral feet cyanotic appearing. BUE with multiple ecchymotic lesions. 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  Neuro: Pt is cognitively appropriate with normal insight, memory, and awareness. Cranial nerves 2-12 are intact. Sensory exam is normal. Reflexes are 2+ in all 4's. Fine motor coordination is intact. No tremors. Motor function is grossly 5/5.  Musculoskeletal: Full ROM, No pain with AROM or PROM in the neck, trunk, or extremities. Posture appropriate Psych: pt appropriate  Assessment/Plan: 1. Functional  deficits secondary to debility secondary to complicated hospital stay- multiple medical issues which require 3+ hours per day of interdisciplinary therapy in a comprehensive inpatient rehab setting.  Physiatrist is providing close team supervision and 24 hour management of active medical problems listed below.  Physiatrist and rehab team continue to assess barriers to discharge/monitor patient progress toward functional and medical goals  Care Tool:  Bathing    Body parts bathed by patient: Right arm, Left arm, Chest, Abdomen, Front perineal area, Right upper leg, Left upper leg, Right lower leg, Left lower leg, Face (declined washing buttocks at time of eval)         Bathing assist Assist Level: Contact Guard/Touching assist     Upper Body Dressing/Undressing Upper body dressing   What is the patient wearing?: Pull over shirt    Upper body assist Assist Level: Set up assist    Lower Body Dressing/Undressing Lower body dressing      What is the patient wearing?: Underwear/pull up, Pants     Lower body assist Assist for lower body dressing: Independent     Toileting Toileting    Toileting assist Assist for toileting: Independent with assistive device Assistive Device Comment: walker   Transfers Chair/bed transfer  Transfers assist     Chair/bed transfer assist level: Supervision/Verbal cueing     Locomotion Ambulation   Ambulation assist      Assist level: Supervision/Verbal cueing Assistive device: Walker-rolling Max distance: 200'   Walk 10 feet activity   Assist     Assist level: Supervision/Verbal  cueing Assistive device: Walker-rolling   Walk 50 feet activity   Assist    Assist level: Supervision/Verbal cueing Assistive device: Walker-rolling    Walk 150 feet activity   Assist    Assist level: Supervision/Verbal cueing Assistive device: Walker-rolling    Walk 10 feet on uneven surface  activity   Assist     Assist  level: Supervision/Verbal cueing Assistive device: Walker-rolling   Wheelchair     Assist Will patient use wheelchair at discharge?: No             Wheelchair 50 feet with 2 turns activity    Assist            Wheelchair 150 feet activity     Assist          Blood pressure 120/75, pulse (!) 59, temperature 97.7 F (36.5 C), temperature source Oral, resp. rate 16, height 5\' 6"  (1.676 m), weight 107.7 kg, SpO2 95 %.  Medical Problem List and Plan: 1.  Impaired mobility and ADLs secondary to Debility due to multiple medical issues- ATN, Afib with RVR, CHF exacerbation, etc             -patient may shower             -ELOS/Goals: modI in PT and OT, I in SLP in 10-12 days  -Continue CIR 2.  Antithrombotics: -DVT/anticoagulation:  Pharmaceutical: Continue Xarelto for PAF             -antiplatelet therapy: On Plavix. 3.  Polyneuropathy/pain Management: On Lyrica twice daily with oxycodone 10 prn for pain. Discussed potentially increasing the Lyrica for right leg radicular pain- can consider as kidney function returns to baseline. On Valium as needed spasms  7/14: scheduled oxycodone AC/HS as she takes at home.   7/15: pain is well controlled.  4. Mood: Currently in a positive mood.              -antipsychotic agents: N/A 5. Neuropsych: This patient is capable of making decisions on her own behalf. 6. Skin/Wound Care: Routine pressure relief measures.  7. Fluids/Electrolytes/Nutrition: Strict I/O. 1800 cc FR/day 8.  Chronic systolic CHF: Monitor for signs of overload.  Check weights daily.  Continue Bumex, metoprolol daily and Plavix. Filed Weights   05/27/20 0427 05/28/20 0345 05/29/20 0351  Weight: 109.5 kg 109.3 kg 107.7 kg    7/15: weight is well controlled. 9.  Chronic hypoxemic respiratory failure: Completed antibiotic course on 07/10.  To taper back to prednisone 20 mg daily starting 07/13.  oxygen dependent- 3 liters  7/13- sats 96% on 3L- con't  meds 10.  PAF: Monitor heart rate twice daily--on Xarelto, metoprolol and amiodarone daily. Has been well controlled.  11.  Acute on chronic renal failure: Baseline SCr around 1.3 per records. Continue to monitor with serial checks as is slowly trending back down from 2.2-->1.43.    7/13- Cr bounced back to 1.15- will monitor- don't want to give fluids due to CHF, but will ask pt to push lfuids PO.  7/14: Cr better controlled.  12.  Morbid obesity: Educate patient on importance of weight loss to help with mobility and medical issues.    LOS: 3 days A FACE TO FACE EVALUATION WAS PERFORMED  Kailee Essman P Nigel Wessman 05/29/2020, 9:12 AM

## 2020-05-29 NOTE — Plan of Care (Signed)
  Problem: Consults Goal: RH GENERAL PATIENT EDUCATION Description: See Patient Education module for education specifics. Outcome: Progressing   Problem: RH SAFETY Goal: RH STG ADHERE TO SAFETY PRECAUTIONS W/ASSISTANCE/DEVICE Description: STG Adhere to Safety Precautions With Mod IAssistance/Device. Outcome: Progressing Goal: RH STG DECREASED RISK OF FALL WITH ASSISTANCE Description: STG Decreased Risk of Fall With Mod I Assistance. Outcome: Progressing   Problem: RH PAIN MANAGEMENT Goal: RH STG PAIN MANAGED AT OR BELOW PT'S PAIN GOAL Description: <2 Outcome: Progressing

## 2020-05-29 NOTE — Progress Notes (Signed)
Occupational Therapy Session Note  Patient Details  Name: Nicole Norton MRN: [id number] Date of Birth: 1940-11-25  Today's Date: 05/29/2020 OT Individual Time: 0700-0730 OT Individual Time Calculation (min): 30 min    Short Term Goals: Week 1:  OT Short Term Goal 1 (Week 1): STGs = LTGs due to ELOS  Skilled Therapeutic Interventions/Progress Updates:    1:1. Pt received in bed agreeable to OT with no pain reported. Pt gathers pants at ambultory level and transfers onto toilet in bathroom with MOD I. Pt toilets/doff pj pants and dons slacks with MOD I seated on toilet. Pt grooms seated at sink with MOD I seated for energy conservation at suggestion of OT. Pt reporting occasionally having neck pain/shoulder pain. Therefore, Ot provides Cervical stretching handout for pt and reviews briefly with pt for carryover at home PRN. Exited session with tp seated in bed, exit alarm on and call light in reach   Therapy Documentation Precautions:  Precautions Precautions: Fall Restrictions Weight Bearing Restrictions: No Other Position/Activity Restrictions: On 2L Home Oxygen General:   Vital Signs: Therapy Vitals Temp: 97.7 F (36.5 C) Temp Source: Oral Pulse Rate: (!) 59 Resp: 16 BP: (!) 167/94 Patient Position (if appropriate): Sitting Oxygen Therapy SpO2: 95 % O2 Device: Room Air Pain:   ADL: ADL Upper Body Bathing: Supervision/safety Where Assessed-Upper Body Bathing: Shower Lower Body Bathing: Contact guard Where Assessed-Lower Body Bathing: Retail buyer: Curator Method: Heritage manager: Nurse, learning disability    Praxis   Exercises:   Other Treatments:     Therapy/Group: Individual Therapy  Tonny Branch 05/29/2020, 7:35 AM

## 2020-05-29 NOTE — IPOC Note (Signed)
Overall Plan of Care [facility]) Patient Details Name: Nicole Norton MRN: [id number] DOB: 1941/03/20  Admitting Diagnosis: Debility  Hospital Problems: Principal Problem:   Debility     Functional Problem List: Nursing Safety, Endurance, Medication Management, Motor, Pain  PT Balance, Endurance, Motor, Pain, Safety  OT Balance, Endurance, Motor, Pain, Safety  SLP    TR         Basic ADL's: OT Bathing, Grooming, Dressing, Toileting     Advanced  ADL's: OT Simple Meal Preparation     Transfers: PT Bed Mobility, Bed to Chair, Car  OT Toilet     Locomotion: PT Ambulation, Stairs     Additional Impairments: OT    SLP        TR      Anticipated Outcomes Item Anticipated Outcome  Self Feeding Independent  Swallowing      Basic self-care  Mod I - Supervision  Toileting  Mod I   Bathroom Transfers Mod I  Bowel/Bladder  Patient to remain continent of boewl and bladder  Transfers  Mod(I)  Locomotion  Mod(I)  Communication     Cognition     Pain  <2  Safety/Judgment  Able to call for help and express her need with Mod I   Therapy Plan: PT Intensity: Minimum of 1-2 x/day ,45 to 90 minutes PT Frequency: 5 out of 7 days PT Duration Estimated Length of Stay: 3-5 Days OT Intensity: Minimum of 1-2 x/day, 45 to 90 minutes OT Frequency: 5 out of 7 days OT Duration/Estimated Length of Stay: 7-10 days     Due to the current state of emergency, patients may not be receiving their 3-hours of Medicare-mandated therapy.   Team Interventions: Nursing Interventions Patient/Family Education, Discharge Planning, Medication Management, Pain Management, Disease Management/Prevention  PT interventions Ambulation/gait training, Community reintegration, DME/adaptive equipment instruction, Neuromuscular re-education, Psychosocial support, Stair training, UE/LE Strength taining/ROM, UE/LE Coordination activities, Therapeutic Activities, Pain management, Functional  electrical stimulation, Discharge planning, Balance/vestibular training, Cognitive remediation/compensation, Disease management/prevention, Functional mobility training, Patient/family education, Splinting/orthotics, Therapeutic Exercise, Visual/perceptual remediation/compensation  OT Interventions Balance/vestibular training, Discharge planning, Pain management, Self Care/advanced ADL retraining, Therapeutic Activities, Visual/perceptual remediation/compensation, Therapeutic Exercise, Patient/family education, Functional mobility training, Disease mangement/prevention, Community reintegration, DME/adaptive equipment instruction, Neuromuscular re-education, Psychosocial support, UE/LE Strength taining/ROM  SLP Interventions    TR Interventions    SW/CM Interventions Discharge Planning, Psychosocial Support, Patient/Family Education   Barriers to Discharge MD  Medical stability  Nursing      PT Home environment access/layout, Lack of/limited family support    OT Decreased caregiver support pt has housekeeper 3x/week and husband to help but she is normally his caretaker  SLP      SW Decreased caregiver support, Lack of/limited family support     Team Discharge Planning: Destination: PT-Home ,OT- Home , SLP-  Projected Follow-up: PT-Outpatient PT, OT-  Home health OT, SLP-  Projected Equipment Needs: PT-Rolling walker with 5" wheels, OT- To be determined, SLP-  Equipment Details: PT- , OT-pt has built in shower seat, declined shower seat for home, further recommendations TBD Patient/family involved in discharge planning: PT- Patient,  OT-Patient, SLP-   MD ELOS: 5 days modI Medical Rehab Prognosis:  Excellent Assessment: [name] is a 79 year old woman who is admitted to CIR with debility following sepsis secondary to right lower lobe pneumonia. Maintains on Xarelto for anticoagulation for PAF. Pain is well maintained on Lyrica and Oxycodone PRN. Weights have been stable. Oxygen  saturations have been stable.  Creatinine is now trending downward.    See Team Conference Notes for weekly updates to the plan of care

## 2020-05-30 MED ORDER — DIAZEPAM 5 MG PO TABS
5.0000 mg | ORAL_TABLET | Freq: Two times a day (BID) | ORAL | Status: DC | PRN
Start: 2020-05-30 — End: 2020-11-20

## 2020-05-30 MED ORDER — PROSOURCE PLUS PO LIQD: 30.0000 mL | Freq: Two times a day (BID) | ORAL | 0 refills | Status: DC

## 2020-05-30 MED ORDER — PANTOPRAZOLE SODIUM 40 MG PO TBEC: 40.0000 mg | DELAYED_RELEASE_TABLET | Freq: Every day | ORAL | 1 refills | Status: DC

## 2020-05-30 NOTE — Progress Notes (Signed)
Patient discharged to home with friend. All belongings and patient's DME sent home with patient. No needs noted or voiced at d/c.

## 2020-05-30 NOTE — Progress Notes (Signed)
Inpatient Rehabilitation Care Coordinator  Discharge Note  The overall goal for the admission was met for:   Discharge location: Yes. D/c to home with support from husband (supervision) and friends.   Length of Stay: Yes. 3 days.   Discharge activity level: Yes. Mod I.  Home/community participation: Yes. Limited.  Services provided included: MD, RD, PT, OT, RN, CM, TR, Pharmacy, Neuropsych and SW  Financial Services: Medicare and Private Insurance: T[employer]are for Life  Follow-up services arranged: Outpatient: [facility] for PT and DME: 3in1 BSC and RW  Comments (or additional information): contact pt [phone]  Patient/Family verbalized understanding of follow-up arrangements: Yes  Individual responsible for coordination of the follow-up plan: Pt to have assistance with coordinating care needs.   Confirmed correct DME delivered: Rana Snare 05/30/2020    Rana Snare

## 2020-05-30 NOTE — Progress Notes (Signed)
[facility] PHYSICAL MEDICINE & REHABILITATION PROGRESS NOTE   Subjective/Complaints: No complaints today. Provided patient with my contact information. Olin Hauser will discuss medications and follow-up appointments today  ROS:  Pt denies SOB, abd pain, CP, N/V/C/D, and vision changes  Objective:   No results found. No results for input(s): WBC, HGB, HCT, PLT in the last 72 hours. Recent Labs    05/28/20 0738  NA 140  K 3.9  CL 100  CO2 30  GLUCOSE 94  BUN 27*  CREATININE 1.68*  CALCIUM 9.4    Intake/Output Summary (Last 24 hours) at 05/30/2020 0930 Last data filed at 05/29/2020 1327 Gross per 24 hour  Intake 300 ml  Output --  Net 300 ml     Physical Exam: Vital Signs Blood pressure (!) 169/94, pulse 63, temperature 97.9 F (36.6 C), temperature source Oral, resp. rate 16, height 5\' 6"  (1.676 m), weight 108.1 kg, SpO2 97 %. General: Alert and oriented x 3, No apparent distress HEENT: Head is normocephalic, atraumatic, PERRLA, EOMI, sclera anicteric, oral mucosa pink and moist, dentition intact, ext ear canals clear,  Neck: Supple without JVD or lymphadenopathy Heart: Reg rate and rhythm. No murmurs rubs or gallops Chest: CTA bilaterally without wheezes, rales, or rhonchi; no distress Abdomen: Soft, non-tender, non-distended, bowel sounds positive. Extremities: No clubbing, cyanosis, or edema. Pulses are 2+ Skin: Bilateral feet cyanotic appearing. BUE with multiple ecchymotic lesions. 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  Neuro: Pt is cognitively appropriate with normal insight, memory, and awareness. Cranial nerves 2-12 are intact. Sensory exam is normal. Reflexes are 2+ in all 4's. Fine motor coordination is intact. No tremors. Motor function is grossly 5/5.  Musculoskeletal: Full ROM, No pain with AROM or PROM in the neck, trunk, or extremities. Posture appropriate Psych: pt appropriate   Assessment/Plan: 1. Functional deficits secondary to  debility secondary to complicated hospital stay- multiple medical issues which require 3+ hours per day of interdisciplinary therapy in a comprehensive inpatient rehab setting.  Physiatrist is providing close team supervision and 24 hour management of active medical problems listed below.  Physiatrist and rehab team continue to assess barriers to discharge/monitor patient progress toward functional and medical goals  Care Tool:  Bathing    Body parts bathed by patient: Right arm, Left arm, Chest, Abdomen, Front perineal area, Right upper leg, Left upper leg, Right lower leg, Left lower leg, Face, Buttocks         Bathing assist Assist Level: Independent with assistive device     Upper Body Dressing/Undressing Upper body dressing   What is the patient wearing?: Pull over shirt    Upper body assist Assist Level: Independent with assistive device    Lower Body Dressing/Undressing Lower body dressing      What is the patient wearing?: Underwear/pull up, Pants     Lower body assist Assist for lower body dressing: Independent with assitive device     Toileting Toileting    Toileting assist Assist for toileting: Independent with assistive device Assistive Device Comment: walker   Transfers Chair/bed transfer  Transfers assist     Chair/bed transfer assist level: Independent with assistive device Chair/bed transfer assistive device: Programmer, multimedia   Ambulation assist      Assist level: Independent with assistive device Assistive device: Walker-rolling Max distance: 200'   Walk 10 feet activity   Assist     Assist level: Independent with assistive device Assistive device: Walker-rolling   Walk 50 feet activity  Assist    Assist level: Independent with assistive device Assistive device: Walker-rolling    Walk 150 feet activity   Assist    Assist level: Independent with assistive device Assistive device: Walker-rolling     Walk 10 feet on uneven surface  activity   Assist     Assist level: Independent with assistive device Assistive device: Aeronautical engineer Will patient use wheelchair at discharge?: No             Wheelchair 50 feet with 2 turns activity    Assist            Wheelchair 150 feet activity     Assist          Blood pressure (!) 169/94, pulse 63, temperature 97.9 F (36.6 C), temperature source Oral, resp. rate 16, height 5\' 6"  (1.676 m), weight 108.1 kg, SpO2 97 %.  Medical Problem List and Plan: 1.  Impaired mobility and ADLs secondary to Debility due to multiple medical issues- ATN, Afib with RVR, CHF exacerbation, etc             -patient may shower             -ELOS/Goals: modI in PT and OT, I in SLP in 10-12 days  -DC home today.  2.  Antithrombotics: -DVT/anticoagulation:  Pharmaceutical: Continue Xarelto for PAF. No signs of bleeding.              -antiplatelet therapy: On Plavix. 3.  Polyneuropathy/pain Management: On Lyrica twice daily with oxycodone 10 prn for pain. Discussed potentially increasing the Lyrica for right leg radicular pain- can consider as kidney function returns to baseline. On Valium as needed spasms  7/14: scheduled oxycodone AC/HS as she takes at home.   7/16: pain is well controlled.  4. Mood: Currently in a positive mood.              -antipsychotic agents: N/A 5. Neuropsych: This patient is capable of making decisions on her own behalf. 6. Skin/Wound Care: Routine pressure relief measures.  7. Fluids/Electrolytes/Nutrition: Strict I/O. 1800 cc FR/day 8.  Chronic systolic CHF: Monitor for signs of overload.  Check weights daily.  Continue Bumex, metoprolol daily and Plavix. Filed Weights   05/28/20 0345 05/29/20 0351 05/30/20 0345  Weight: 109.3 kg 107.7 kg 108.1 kg    7/16: weight slightly increased from yesterday but down from 7/14.  9.  Chronic hypoxemic respiratory failure: Completed  antibiotic course on 07/10.  To taper back to prednisone 20 mg daily starting 07/13.  oxygen dependent- 3 liters  7/13- sats 96% on 3L- con't meds 10.  PAF: Monitor heart rate twice daily--on Xarelto, metoprolol and amiodarone daily. Has been well controlled.  11.  Acute on chronic renal failure: Baseline SCr around 1.3 per records. Continue to monitor with serial checks as is slowly trending back down from 2.2-->1.43.    7/13- Cr bounced back to 1.65- will monitor- don't want to give fluids due to CHF, but will ask pt to push lfuids PO.  7/14: Cr better controlled.  12.  Morbid obesity: Educate patient on importance of weight loss to help with mobility and medical issues.   >30 minutes spent in discharge of patient including review of medications and follow-up appointments, physical examination, providing contact information with patient, review of BP and discussion of PCP follow-up regarding Bumex, assessment of pain, and in answering all patient's questions    LOS: 4 days A  FACE TO FACE EVALUATION WAS PERFORMED  Cully Luckow P Davanna He 05/30/2020, 9:30 AM

## 2020-05-30 NOTE — Discharge Instructions (Signed)
Inpatient Rehab Discharge Instructions  Nicole Norton Discharge date and time: 05/30/20   Activities/Precautions/ Functional Status: Activity: no lifting, driving, or strenuous exercise till cleared by MD. Diet: cardiac diet Wound Care: none needed    Functional status:  ___ No restrictions     ___ Walk up steps independently ___ 24/7 supervision/assistance   ___ Walk up steps with assistance _X__ Intermittent supervision/assistance  ___ Bathe/dress independently ___ Walk with walker     ___ Bathe/dress with assistance ___ Walk Independently    ___ Shower independently ___ Walk with assistance    ___ Shower with assistance _X__ No alcohol     ___ Return to work/school ________   Special Instructions: 1. Monitor weights daily--contact Cardiology for input on diuretics.    COMMUNITY REFERRALS UPON DISCHARGE:     Outpatient: PT     OT             Agency: [facility]   Phone: [phone]             Appointment Date/Time:*Please expect follow-up within 7-10 business days to schedule your appointment. If you have not received follow-up, be sure to contact the site directly.*  Medical Equipment/Items Ordered: 3in1 bedside commode and Rolling walker                                                 Agency/Supplier: [employer] [phone]     My questions have been answered and I understand these instructions. I will adhere to these goals and the provided educational materials after my discharge from the hospital.  Patient/Caregiver Signature _______________________________ Date __________  Clinician Signature _______________________________________ Date __________  Please bring this form and your medication list with you to all your follow-up doctor's appointments.

## 2020-05-30 NOTE — Plan of Care (Signed)
  Problem: Consults Goal: RH GENERAL PATIENT EDUCATION Description: See Patient Education module for education specifics. Outcome: Completed/Met   Problem: RH SAFETY Goal: RH STG ADHERE TO SAFETY PRECAUTIONS W/ASSISTANCE/DEVICE Description: STG Adhere to Safety Precautions With Mod IAssistance/Device. Outcome: Completed/Met Goal: RH STG DECREASED RISK OF FALL WITH ASSISTANCE Description: STG Decreased Risk of Fall With Mod I Assistance. Outcome: Completed/Met   Problem: RH PAIN MANAGEMENT Goal: RH STG PAIN MANAGED AT OR BELOW PT'S PAIN GOAL Description: <2 Outcome: Completed/Met

## 2020-05-30 NOTE — Discharge Summary (Addendum)
Physician Discharge Summary  Patient ID: Nicole Norton MRN: [id number] DOB/AGE: [birth year] 79 y.o.  Admit date: 05/26/2020 Discharge date: 05/30/2020  Discharge Diagnoses:  Principal Problem:   Debility Active Problems:   Idiopathic chronic eosinophilic pneumonia   Iron deficiency anemia   Chronic anticoagulation   Chronic systolic heart failure (HCC)   Acute on chronic renal failure [facility])   Discharged Condition: stable   Significant Diagnostic Studies: N/A   Labs:  Basic Metabolic Panel: BMP Latest Ref Rng & Units 05/28/2020 05/27/2020 05/25/2020  Glucose 70 - 99 mg/dL 94 90 85  BUN 8 - 23 mg/dL 27(H) 28(H) 27(H)  Creatinine 0.44 - 1.00 mg/dL 1.68(H) 1.85(H) 1.43(H)  BUN/Creat Ratio 6 - 22 (calc) - - -  Sodium 135 - 145 mmol/L 140 142 141  Potassium 3.5 - 5.1 mmol/L 3.9 3.9 4.4  Chloride 98 - 111 mmol/L 100 102 101  CO2 22 - 32 mmol/L 30 29 30   Calcium 8.9 - 10.3 mg/dL 9.4 9.2 9.4    CBC: CBC Latest Ref Rng & Units 05/27/2020 05/24/2020 05/23/2020  WBC 4.0 - 10.5 K/uL 7.7 9.7 9.2  Hemoglobin 12.0 - 15.0 g/dL 11.6(L) 11.3(L) 12.8  Hematocrit 36 - 46 % 35.8(L) 35.5(L) 40.5  Platelets 150 - 400 K/uL 294 161 202    CBG: No results for input(s): GLUCAP in the last 168 hours.  Brief HPI:   Nicole Norton is a 79 y.o. female with history of chronic systolic CHF, Churg-Strauss syndrome, chronic hypoxemic respiratory failure who was admitted on 05/15/20 with acute on chronic hypoxemia due to RLL CAP and decompensated heart failure. She as treated with IV diuresis as well as IV antibiotics and stress dose steroids. Hospital course significant for A fib with RVR, ACS as well as acute on chronic renal failure due to ATN from sepsis/hypotension/contrast induced nephropathy.  Diuretics have been adjusted and Bumex was resumed at lower dose as SCr was improving. Respiratiory status was improving but patient noted to be deconditioned. CIR was recommended due to functional  decline.    Hospital Course: [facility] Silverthorn was admitted to rehab 05/26/2020 for inpatient therapies to consist of PT and OT at least three hours five days a week. Past admission physiatrist, therapy team and rehab RN have worked together to provide customized collaborative inpatient rehab. Respiratory status has been stable on home dose prednisone and she continued on oxygen 3 L per [place]. Peripheral edema is stable without signs of overload. P atient advised to continue to monitor intake as well as weights and follow up with cardiology for input on diuretics.  Follow up labs done showed brief rise in SCr and anticipate that renal status will continue to improve after discharge. Pain has been controlled with oxycodone scheduled on ac/hs basis.  Her endurance has improved and she has progressed to modified independent level. She will continue to receive follow up outpatient PT in [place] at after discharge.   During patient's stay in rehab weekly team conferences was held to monitor patient's progress, set goals and discuss barriers to discharge. At admission, patient required CGA to supervision for ADL tasks and supervision for mobility. She  has had improvement in activity tolerance, balance, postural control as well as ability to compensate for deficits.  She is able to complete ADL tasks at modified independent level. She is modified independent for transfers and to ambulate 200' with RW.   Discharge disposition: 01-Home or Self Care  Diet: Heart Healthy/Low salt.   Special Instructions:  1. Monitor weights daily.    Discharge Instructions    Ambulatory referral to Physical Medicine Rehab   Complete by: As directed    1-2 weeks TC appointment   Ambulatory referral to Physical Therapy   Complete by: As directed    Eval and treat   Iontophoresis - 4 mg/ml of dexamethasone: No   T.E.N.S. Unit Evaluation and Dispense as Indicated: No     Allergies as of 05/30/2020      Reactions    Allopurinol Palpitations   Uloric [febuxostat] Other (See Comments)   Episodes of AFIB   Ace Inhibitors Other (See Comments)   Renal function   Colchicine Other (See Comments)   palpitations   Cymbalta [duloxetine Hcl] Other (See Comments)   palpitations   Etodolac Other (See Comments)   heart flutters and headaches    Fluoxetine Other (See Comments)   Jitteriness   Omeprazole Nausea Only   Paroxetine Nausea Only   Dizzy   Solu-medrol [methylprednisolone Sodium Succ]    Heart palpitations/bad headache   Statins Tinitus   REACTION: myalgias REACTION: myalgias   Lasix [furosemide] Rash   Does not work very well   Penicillins Rash   Tolerated Ceftriaxone 4/10-4/11/20 \ Did it involve swelling of the face/tongue/throat, SOB, or low BP? No Did it involve sudden or severe rash/hives, skin peeling, or any reaction on the inside of your mouth or nose? No Did you need to seek medical attention at a hospital or doctor's office? No When did it last happen?35 years ago If all above answers are "NO", may proceed with cephalosporin use.      Medication List    STOP taking these medications   diclofenac 1.3 % Ptch Commonly known as: FLECTOR   EPINEPHrine 0.3 mg/0.3 mL Soaj injection Commonly known as: EPI-PEN   FLUoxetine 20 MG capsule Commonly known as: PROZAC   HYDROmorphone 2 MG tablet Commonly known as: DILAUDID   potassium chloride SA 20 MEQ tablet Commonly known as: KLOR-CON     TAKE these medications   (feeding supplement) PROSource Plus liquid Take 30 mLs by mouth 2 (two) times daily between meals.   acetaminophen 325 MG tablet Commonly known as: TYLENOL Take 1-2 tablets (325-650 mg total) by mouth every 4 (four) hours as needed for mild pain.   AMBULATORY NON FORMULARY MEDICATION On continuous oxygen 2 L.  Stationary pulse ox at 88%.  Drops with activity.  Diagnosis restrictive lung disease with hypoxemia. Portable gas via nasal cannula.   amiodarone 200  MG tablet Commonly known as: PACERONE Take 1 tablet (200 mg total) by mouth daily.   Amitiza 24 MCG capsule Generic drug: lubiprostone Take 24 mcg by mouth 2 (two) times daily with a meal. Notes to patient: Use as needed   bumetanide 2 MG tablet Commonly known as: BUMEX Take 0.5 tablets (1 mg total) by mouth daily.   CENTRUM SILVER PO Take 1 tablet by mouth daily.   cholecalciferol 25 MCG (1000 UNIT) tablet Commonly known as: VITAMIN D3 Take 1,000 Units by mouth daily.   clobetasol cream 0.05 % Commonly known as: TEMOVATE Apply 1 application topically daily as needed (on affected area(s)).   clopidogrel 75 MG tablet Commonly known as: PLAVIX Take 1 tablet (75 mg total) by mouth daily.   diazepam 5 MG tablet Commonly known as: VALIUM Take 1 tablet (5 mg total) by mouth 2 (two) times daily as needed for anxiety.   docusate sodium 100 MG capsule Commonly known as: COLACE  Take 1 capsule (100 mg total) by mouth 2 (two) times daily.   ferrous sulfate 325 (65 FE) MG tablet Take 1 tablet (325 mg total) by mouth every Monday, Wednesday, and Friday.   Fish Oil 1200 MG Caps Take 1,200 mg by mouth daily.   guaiFENesin 600 MG 12 hr tablet Commonly known as: MUCINEX Take 1 tablet (600 mg total) by mouth 2 (two) times daily.   levothyroxine 200 MCG tablet Commonly known as: SYNTHROID Take 1 tablet (200 mcg total) by mouth daily before breakfast. What changed: Another medication with the same name was removed. Continue taking this medication, and follow the directions you see here.   metolazone 2.5 MG tablet Commonly known as: ZAROXOLYN Take one tablet by mouth as needed for weight gain of 3lbs from baseline. What changed:   how much to take  how to take this  when to take this  reasons to take this  additional instructions   metoprolol succinate 25 MG 24 hr tablet Commonly known as: TOPROL-XL Take 0.5 tablets (12.5 mg total) by mouth daily. What changed:   how  much to take  when to take this   Nucala 100 MG/ML Soaj Generic drug: Mepolizumab Inject 100 mg into the skin every 30 (thirty) days.   Oxycodone HCl 10 MG Tabs Take 10 mg by mouth 5 (five) times daily as needed (pain).   pantoprazole 40 MG tablet Commonly known as: PROTONIX Take 1 tablet (40 mg total) by mouth daily. What changed: when to take this   predniSONE 20 MG tablet Commonly known as: DELTASONE Take 1 tablet (20 mg total) by mouth daily.   pregabalin 75 MG capsule Commonly known as: LYRICA Take 1 capsule (75 mg total) by mouth 2 (two) times daily. What changed: when to take this   Premarin 0.625 MG tablet Generic drug: estrogens (conjugated) TAKE 1 TABLET DAILY AS NEEDED What changed:   how much to take  when to take this   vitamin B-12 500 MCG tablet Commonly known as: CYANOCOBALAMIN Take 500 mcg by mouth every Monday, Wednesday, and Friday.   Xarelto 20 MG Tabs tablet Generic drug: rivaroxaban TAKE 1 TABLET DAILY WITH SUPPER What changed: See the new instructions.   ZyrTEC 10 MG tablet Generic drug: cetirizine Take 10 mg by mouth daily.       Follow-up Information    Raulkar, Clide Deutscher, MD Follow up.   Specialty: Physical Medicine and Rehabilitation Why: Office will call you with follow up appt Contact information: [address] [place] [address] [phone]        Hali Marry, MD. Call.   Specialty: Family Medicine Why: for post hospital follow up Contact information: [place] [address] [phone]        Lorretta Harp, MD. Call.   Specialties: Cardiology, Radiology Why: for follow up appointment/medication changes Contact information: [address] [place] [address] [phone]               Signed: Bary Leriche 06/03/2020, 4:39 PM

## 2020-06-03 DIAGNOSIS — N189 Chronic kidney disease, unspecified: Secondary | ICD-10-CM

## 2020-06-05 DIAGNOSIS — M170 Bilateral primary osteoarthritis of knee: Secondary | ICD-10-CM | POA: Diagnosis not present

## 2020-06-05 DIAGNOSIS — M47812 Spondylosis without myelopathy or radiculopathy, cervical region: Secondary | ICD-10-CM | POA: Diagnosis not present

## 2020-06-05 DIAGNOSIS — G894 Chronic pain syndrome: Secondary | ICD-10-CM | POA: Diagnosis not present

## 2020-06-05 DIAGNOSIS — M47817 Spondylosis without myelopathy or radiculopathy, lumbosacral region: Secondary | ICD-10-CM | POA: Diagnosis not present

## 2020-06-09 ENCOUNTER — Telehealth (HOSPITAL_COMMUNITY): Payer: Self-pay | Admitting: *Deleted

## 2020-06-09 NOTE — Telephone Encounter (Signed)
Left message on voicemail per DPR in reference to upcoming appointment scheduled on 06/11/20 with detailed instructions given per Myocardial Perfusion Study Information Sheet for the test. LM to arrive 15 minutes early, and that it is imperative to arrive on time for appointment to keep from having the test rescheduled. If you need to cancel or reschedule your appointment, please call the office within 24 hours of your appointment. Failure to do so may result in a cancellation of your appointment, and a $50 no show fee. Phone number given for call back for any questions. Kirstie Peri

## 2020-06-10 ENCOUNTER — Encounter: Payer: Medicare Other | Admitting: Registered Nurse

## 2020-06-11 ENCOUNTER — Other Ambulatory Visit: Payer: Self-pay

## 2020-06-11 ENCOUNTER — Ambulatory Visit (HOSPITAL_COMMUNITY): Payer: Medicare Other | Attending: Physician Assistant

## 2020-06-11 DIAGNOSIS — R079 Chest pain, unspecified: Secondary | ICD-10-CM | POA: Diagnosis not present

## 2020-06-11 MED ORDER — REGADENOSON 0.4 MG/5ML IV SOLN
0.4000 mg | Freq: Once | INTRAVENOUS | Status: AC
  Administered 2020-06-11: 0.4 mg via INTRAVENOUS

## 2020-06-11 MED ORDER — TECHNETIUM TC 99M TETROFOSMIN IV KIT
32.0000 | PACK | Freq: Once | INTRAVENOUS | Status: AC | PRN
  Administered 2020-06-11: 32 via INTRAVENOUS
  Filled 2020-06-11: qty 32

## 2020-06-12 ENCOUNTER — Ambulatory Visit (HOSPITAL_COMMUNITY): Payer: Medicare Other | Attending: Cardiology

## 2020-06-12 LAB — MYOCARDIAL PERFUSION IMAGING
LV dias vol: 92 mL (ref 46–106)
LV sys vol: 34 mL
Peak HR: 82 {beats}/min
Rest HR: 73 {beats}/min
SDS: 6
SRS: 0
SSS: 6
TID: 0.91

## 2020-06-12 MED ORDER — TECHNETIUM TC 99M TETROFOSMIN IV KIT
30.6000 | PACK | Freq: Once | INTRAVENOUS | Status: AC | PRN
  Administered 2020-06-12: 30.6 via INTRAVENOUS
  Filled 2020-06-12: qty 31

## 2020-06-16 ENCOUNTER — Inpatient Hospital Stay: Payer: Medicare Other | Admitting: Family Medicine

## 2020-06-16 ENCOUNTER — Telehealth: Payer: Self-pay | Admitting: Family Medicine

## 2020-06-16 NOTE — Telephone Encounter (Signed)
Patient called at 8:58am stating that patient can not come in today.

## 2020-06-18 ENCOUNTER — Encounter: Payer: Self-pay | Admitting: Cardiovascular Disease

## 2020-06-18 ENCOUNTER — Ambulatory Visit (INDEPENDENT_AMBULATORY_CARE_PROVIDER_SITE_OTHER): Payer: Medicare Other | Admitting: Cardiovascular Disease

## 2020-06-18 ENCOUNTER — Other Ambulatory Visit: Payer: Self-pay

## 2020-06-18 DIAGNOSIS — E785 Hyperlipidemia, unspecified: Secondary | ICD-10-CM | POA: Diagnosis not present

## 2020-06-18 DIAGNOSIS — I10 Essential (primary) hypertension: Secondary | ICD-10-CM | POA: Diagnosis not present

## 2020-06-18 DIAGNOSIS — I4891 Unspecified atrial fibrillation: Secondary | ICD-10-CM

## 2020-06-18 DIAGNOSIS — I5043 Acute on chronic combined systolic (congestive) and diastolic (congestive) heart failure: Secondary | ICD-10-CM

## 2020-06-18 NOTE — Assessment & Plan Note (Signed)
History of PAF maintaining sinus rhythm on Xarelto and amiodarone.

## 2020-06-18 NOTE — Assessment & Plan Note (Signed)
History of essential hypertension with blood pressure measured today 154/78.  She says this is somewhat higher than she usually runs.  She is on metoprolol.

## 2020-06-18 NOTE — Assessment & Plan Note (Signed)
History of hyperlipidemia intolerant to statin therapy with lipid profile performed 01/15/2020 revealing total cholesterol 288, LDL of 166 and HDL of 69.  I am going to refer her to Dr. Debara Pickett in the lipid clinic for further evaluation and treatment.

## 2020-06-18 NOTE — Assessment & Plan Note (Signed)
Nicole Norton has grade 2 diastolic dysfunction with normal LV function by recent 2D echo 05/20/2020.  She was recently admitted with respiratory failure which is multifactorial including community-acquired pneumonia and diastolic heart failure and was diuresed.  She is on oral diuretics and is on a low-salt diet.

## 2020-06-18 NOTE — Progress Notes (Signed)
06/18/2020 Lauro Regulus [place]   September 20, 1941  [id number]  Primary Physician Hali Marry, MD Primary Cardiologist: Lorretta Harp MD FACP, [place], [place], [place]  HPI:  Nicole Norton is a 79 y.o.  mildly overweight married Caucasian female with no children who I last saw in the office 01/17/2018.  Her husband [name] was also a patient of mine. I last saw her in the office 01/19/17 .She has a history of hypertension, hyperlipidemia and an arrhythmia which I diagnosed 10-15 years ago. She has never had a heart attack or stroke. She's had multiple stress test off which have been normal as well as 2-D echocardiograms and event monitors. She is statin intolerant. She denies chest pain or shortness of breath. She recently was diagnosed with gout as well as osteoarthritis arthritis. [name] She was placed on gout medicines with resulted in exacerbation of her arrhythmia. Once thesemedicines were discontinued her arrhythmia returned to baseline. An event monitor showed episodes of paroxysmal atrial fibrillation. A routine GXT was normal. I referred her to Dr. Curt Bears , electrophysiology, who placed her on flecainide and Xarelto. Her A. fib has markedly improved as has her blood pressure control.    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 Higher doses have exacerbated her atrial fibrillation.  I did see her during her hospitalization in June 2020 where she came in volume overloaded approxitwenty 5 pounds secondary to prednisone and was diuresed.  She was recently mated to the hospital 05/19/2020 and discharged home 1 week later.  She had mildly elevated enzymes thought to be related to demand ischemia as well as atrial fibrillation which converted to sinus rhythm.  She was diuresed and was discharged home on oral diuretics.  She is aware of salt restriction feels clinically improved.  She is on constant home O2  desaturates fairly quickly with activity.  2D echo performed  on 05/20/2020 revealed normal LV systolic function with grade 2 diastolic dysfunction and a subsequent outpatient Myoview stress test was negative for ischemia.   Current Meds  Medication Sig  . acetaminophen (TYLENOL) 325 MG tablet Take 1-2 tablets (325-650 mg total) by mouth every 4 (four) hours as needed for mild pain.  [name] AMBULATORY NON FORMULARY MEDICATION On continuous oxygen 2 L.  Stationary pulse ox at 88%.  Drops with activity.  Diagnosis restrictive lung disease with hypoxemia. Portable gas via nasal cannula.  [name] amiodarone (PACERONE) 200 MG tablet Take 1 tablet (200 mg total) by mouth daily.  . bumetanide (BUMEX) 2 MG tablet Take 0.5 tablets (1 mg total) by mouth daily.  . cetirizine (ZYRTEC) 10 MG tablet Take 10 mg by mouth daily.   . cholecalciferol (VITAMIN D3) 25 MCG (1000 UT) tablet Take 1,000 Units by mouth daily.  . clobetasol cream (TEMOVATE) 6.22 % Apply 1 application topically daily as needed (on affected area(s)).  . clopidogrel (PLAVIX) 75 MG tablet Take 1 tablet (75 mg total) by mouth daily.  . diazepam (VALIUM) 5 MG tablet Take 1 tablet (5 mg total) by mouth 2 (two) times daily as needed for anxiety.  . docusate sodium (COLACE) 100 MG capsule Take 1 capsule (100 mg total) by mouth 2 (two) times daily.  . ferrous sulfate 325 (65 FE) MG tablet Take 1 tablet (325 mg total) by mouth every Monday, Wednesday, and Friday.  [name] guaiFENesin (MUCINEX) 600 MG 12 hr tablet Take 1 tablet (600 mg total) by mouth 2 (  two) times daily.  [name] levothyroxine (SYNTHROID) 200 MCG tablet Take 1 tablet (200 mcg total) by mouth daily before breakfast.  . lubiprostone (AMITIZA) 24 MCG capsule Take 24 mcg by mouth 2 (two) times daily with a meal.  . Mepolizumab (NUCALA) 100 MG/ML SOAJ Inject 100 mg into the skin every 30 (thirty) days.   . metolazone (ZAROXOLYN) 2.5 MG tablet Take one tablet by mouth as needed for weight gain of 3lbs from  baseline. (Patient taking differently: Take 2.5 mg by mouth daily as needed (weight gain of 3 lbs from baseline). )  . metoprolol succinate (TOPROL-XL) 25 MG 24 hr tablet Take 0.5 tablets (12.5 mg total) by mouth daily. (Patient taking differently: Take 25 mg by mouth daily. )  . Multiple Vitamins-Minerals (CENTRUM SILVER PO) Take 1 tablet by mouth daily.   . Nutritional Supplements (,FEEDING SUPPLEMENT, PROSOURCE PLUS) liquid Take 30 mLs by mouth 2 (two) times daily between meals.  . Omega-3 Fatty Acids (FISH OIL) 1200 MG CAPS Take 1,200 mg by mouth daily.   . Oxycodone HCl 10 MG TABS Take 10 mg by mouth 5 (five) times daily as needed (pain).   . pantoprazole (PROTONIX) 40 MG tablet Take 1 tablet (40 mg total) by mouth daily.  . predniSONE (DELTASONE) 20 MG tablet Take 1 tablet (20 mg total) by mouth daily.  . pregabalin (LYRICA) 75 MG capsule Take 1 capsule (75 mg total) by mouth 2 (two) times daily.  [name] PREMARIN 0.625 MG tablet TAKE 1 TABLET DAILY AS NEEDED (Patient taking differently: Take 0.625 mg by mouth 3 (three) times a week. )  . vitamin B-12 (CYANOCOBALAMIN) 500 MCG tablet Take 500 mcg by mouth every Monday, Wednesday, and Friday.   Alveda Reasons 20 MG TABS tablet TAKE 1 TABLET DAILY WITH SUPPER (Patient taking differently: Take 20 mg by mouth daily with supper. )     Allergies  Allergen Reactions  . Allopurinol Palpitations  . Uloric [Febuxostat] Other (See Comments)    Episodes of AFIB  . Ace Inhibitors Other (See Comments)    Renal function  . Colchicine Other (See Comments)    palpitations  . Cymbalta [Duloxetine Hcl] Other (See Comments)    palpitations  . Etodolac Other (See Comments)    heart flutters and headaches   . Fluoxetine Other (See Comments)    Jitteriness  . Omeprazole Nausea Only  . Paroxetine Nausea Only    Dizzy  . Solu-Medrol [Methylprednisolone Sodium Succ]     Heart palpitations/bad headache  . Statins Tinitus    REACTION: myalgias REACTION: myalgias    . Lasix [Furosemide] Rash    Does not work very well  . Penicillins Rash    Tolerated Ceftriaxone 4/10-4/11/20 \ Did it involve swelling of the face/tongue/throat, SOB, or low BP? No Did it involve sudden or severe rash/hives, skin peeling, or any reaction on the inside of your mouth or nose? No Did you need to seek medical attention at a hospital or doctor's office? No When did it last happen?35 years ago If all above answers are "NO", may proceed with cephalosporin use.    Social History   Socioeconomic History  . Marital status: Married    Spouse name: Not on file  . Number of children: Not on file  . Years of education: Not on file  . Highest education level: Not on file  Occupational History  . Not on file  Tobacco Use  . Smoking status: Former Smoker    Packs/day: 1.00  Years: 20.00    Pack years: 20.00    Types: Cigarettes    Quit date: 11/16/1999    Years since quitting: 20.6  . Smokeless tobacco: Never Used  Vaping Use  . Vaping Use: Never used  Substance and Sexual Activity  . Alcohol use: Yes    Alcohol/week: 6.0 standard drinks    Types: 6 Glasses of wine per week  . Drug use: No  . Sexual activity: Not on file  Other Topics Concern  . Not on file  Social History Narrative   Lives at home with his husband and the cat.     Social Determinants of Health   Financial Resource Strain:   . Difficulty of Paying Living Expenses:   Food Insecurity:   . Worried About Charity fundraiser in the Last Year:   . Arboriculturist in the Last Year:   Transportation Needs:   . Film/video editor (Medical):   [name] Lack of Transportation (Non-Medical):   Physical Activity:   . Days of Exercise per Week:   . Minutes of Exercise per Session:   Stress:   . Feeling of Stress :   Social Connections:   . Frequency of Communication with Friends and Family:   . Frequency of Social Gatherings with Friends and Family:   . Attends Religious Services:   . Active  Member of Clubs or Organizations:   . Attends Archivist Meetings:   [name] Marital Status:   Intimate Partner Violence:   . Fear of Current or Ex-Partner:   . Emotionally Abused:   [name] Physically Abused:   . Sexually Abused:      Review of Systems: General: negative for chills, fever, night sweats or weight changes.  Cardiovascular: negative for chest pain, dyspnea on exertion, edema, orthopnea, palpitations, paroxysmal nocturnal dyspnea or shortness of breath Dermatological: negative for rash Respiratory: negative for cough or wheezing Urologic: negative for hematuria Abdominal: negative for nausea, vomiting, diarrhea, bright red blood per rectum, melena, or hematemesis Neurologic: negative for visual changes, syncope, or dizziness All other systems reviewed and are otherwise negative except as noted above.    Blood pressure (!) 154/78, pulse (!) 56, height 5\' 6"  (1.676 m), weight 236 lb (107 kg).  General appearance: alert and no distress Neck: no adenopathy, no carotid bruit, no JVD, supple, symmetrical, trachea midline and thyroid not enlarged, symmetric, no tenderness/mass/nodules Lungs: clear to auscultation bilaterally Heart: regular rate and rhythm, S1, S2 normal, no murmur, click, rub or gallop Extremities: extremities normal, atraumatic, no cyanosis or edema Pulses: 2+ and symmetric Skin: Skin color, texture, turgor normal. No rashes or lesions Neurologic: Alert and oriented X 3, normal strength and tone. Normal symmetric reflexes. Normal coordination and gait  EKG sinus bradycardia 56 with poor R wave progression.  I personally reviewed this EKG.  ASSESSMENT AND PLAN:   Hyperlipidemia LDL goal <160 History of hyperlipidemia intolerant to statin therapy with lipid profile performed 01/15/2020 revealing total cholesterol 288, LDL of 166 and HDL of 69.  I am going to refer her to Dr. Debara Pickett in the lipid clinic for further evaluation and treatment.  Essential  hypertension, benign History of essential hypertension with blood pressure measured today 154/78.  She says this is somewhat higher than she usually runs.  She is on metoprolol.  Atrial fibrillation with RVR (HCC) History of PAF maintaining sinus rhythm on Xarelto and amiodarone.  Acute on chronic combined systolic and diastolic CHF (congestive heart failure) ([place])  Ms. Ruotolo has grade 2 diastolic dysfunction with normal LV function by recent 2D echo 05/20/2020.  She was recently admitted with respiratory failure which is multifactorial including community-acquired pneumonia and diastolic heart failure and was diuresed.  She is on oral diuretics and is on a low-salt diet.      Lorretta Harp MD FACP,FACC,FAHA, [facility] 06/18/2020 12:09 PM

## 2020-06-18 NOTE — Patient Instructions (Signed)
Medication Instructions:  Your physician recommends that you continue on your current medications as directed. Please refer to the Current Medication list given to you today.  *If you need a refill on your cardiac medications before your next appointment, please call your pharmacy*    Follow-Up: At [facility], you and your health needs are our priority.  As part of our continuing mission to provide you with exceptional heart care, we have created designated Provider Care Teams.  These Care Teams include your primary Cardiologist (physician) and Advanced Practice Providers (APPs -  Physician Assistants and Nurse Practitioners) who all work together to provide you with the care you need, when you need it.  We recommend signing up for the patient portal called "MyChart".  Sign up information is provided on this After Visit Summary.  MyChart is used to connect with patients for Virtual Visits (Telemedicine).  Patients are able to view lab/test results, encounter notes, upcoming appointments, etc.  Non-urgent messages can be sent to your provider as well.   To learn more about what you can do with MyChart, go to [email or web address].    Your next appointment:   3 month(s) with Rosaria Ferries PA-C 6 months with Dr. [name] Found    Other Instructions  You have been referred to Dr. Lyman Bishop with lipid clinic

## 2020-06-19 ENCOUNTER — Telehealth: Payer: Self-pay

## 2020-06-19 NOTE — Telephone Encounter (Signed)
Joanne advised.

## 2020-06-19 NOTE — Telephone Encounter (Signed)
Nicole Norton with [facility] called and states she was in the home of Nicole Norton. While in the home she noticed Nicole Norton could also use the free service through [facility]. For palliative care with [facility]. She was in the hospital in July for pneumonia. Please advise if ok to start care.

## 2020-06-19 NOTE — Telephone Encounter (Signed)
Yes ok for consult for palliative care

## 2020-06-23 ENCOUNTER — Telehealth: Payer: Self-pay | Admitting: Family Medicine

## 2020-06-23 NOTE — Telephone Encounter (Signed)
Appointment has been made. No further questions.  °

## 2020-06-23 NOTE — Telephone Encounter (Signed)
YOu can always use an acute for hospital folllowups

## 2020-06-23 NOTE — Telephone Encounter (Signed)
Patient would like to be worked in for a hospital follow up next week. No open slots only acute. Please advise.

## 2020-06-29 ENCOUNTER — Other Ambulatory Visit: Payer: Self-pay | Admitting: Cardiology

## 2020-07-02 ENCOUNTER — Other Ambulatory Visit: Payer: Self-pay | Admitting: Family Medicine

## 2020-07-02 ENCOUNTER — Telehealth: Payer: Self-pay | Admitting: Cardiovascular Disease

## 2020-07-02 ENCOUNTER — Inpatient Hospital Stay: Payer: Medicare Other | Admitting: Family Medicine

## 2020-07-02 MED ORDER — LEVOTHYROXINE SODIUM 200 MCG PO TABS: 200.0000 ug | ORAL_TABLET | Freq: Every day | ORAL | 0 refills | Status: DC

## 2020-07-02 NOTE — Telephone Encounter (Signed)
Left message on confidential voicemail for Joann RN with [facility] update as noted

## 2020-07-02 NOTE — Telephone Encounter (Signed)
Spoke with Arville Go RN about medications  Patient is on xarelto for AFib She is unsure why patient is on plavix - prescribing provider is P. Love PA 05/29/20 (hospital/in-patient rehab discharge)  Will send to Dr. [name] Found to advise

## 2020-07-02 NOTE — Telephone Encounter (Signed)
    Pt c/o medication issue:  1. Name of Medication:   clopidogrel (PLAVIX) 75 MG tablet    XARELTO 20 MG TABS tablet     2. How are you currently taking this medication (dosage and times per day)?   3. Are you having a reaction (difficulty breathing--STAT)?   4. What is your medication issue? RN Arville Go called and would like to verify these two meds if pt need to take both plavix and xarelto.

## 2020-07-02 NOTE — Telephone Encounter (Signed)
Per hospital note:  "Elevated troponin: Secondary to demand ischemia-cardiology recommending outpatient stress test.On Plavix-Per cardiology note on 7/8-lower  risk of bleeding with Xarelto/Plavixthan ASA"

## 2020-07-14 DIAGNOSIS — I130 Hypertensive heart and chronic kidney disease with heart failure and stage 1 through stage 4 chronic kidney disease, or unspecified chronic kidney disease: Secondary | ICD-10-CM | POA: Diagnosis not present

## 2020-07-16 ENCOUNTER — Inpatient Hospital Stay: Payer: Medicare Other | Admitting: Family Medicine

## 2020-07-16 DIAGNOSIS — I130 Hypertensive heart and chronic kidney disease with heart failure and stage 1 through stage 4 chronic kidney disease, or unspecified chronic kidney disease: Secondary | ICD-10-CM | POA: Diagnosis not present
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
## 2020-08-06 DIAGNOSIS — M47817 Spondylosis without myelopathy or radiculopathy, lumbosacral region: Secondary | ICD-10-CM | POA: Diagnosis not present

## 2020-08-06 DIAGNOSIS — M170 Bilateral primary osteoarthritis of knee: Secondary | ICD-10-CM | POA: Diagnosis not present

## 2020-08-06 DIAGNOSIS — G894 Chronic pain syndrome: Secondary | ICD-10-CM | POA: Diagnosis not present

## 2020-08-06 DIAGNOSIS — M47812 Spondylosis without myelopathy or radiculopathy, cervical region: Secondary | ICD-10-CM | POA: Diagnosis not present

## 2020-08-14 ENCOUNTER — Telehealth: Payer: Medicare Other | Admitting: Internal Medicine

## 2020-08-15 ENCOUNTER — Ambulatory Visit: Payer: Medicare Other | Admitting: Physician Assistant

## 2020-09-01 ENCOUNTER — Other Ambulatory Visit: Payer: Self-pay | Admitting: Family Medicine

## 2020-09-01 DIAGNOSIS — Z1231 Encounter for screening mammogram for malignant neoplasm of breast: Secondary | ICD-10-CM

## 2020-09-03 ENCOUNTER — Ambulatory Visit: Payer: Medicare Other | Admitting: Internal Medicine

## 2020-09-03 DIAGNOSIS — M47817 Spondylosis without myelopathy or radiculopathy, lumbosacral region: Secondary | ICD-10-CM | POA: Diagnosis not present

## 2020-09-03 DIAGNOSIS — M170 Bilateral primary osteoarthritis of knee: Secondary | ICD-10-CM | POA: Diagnosis not present

## 2020-09-03 DIAGNOSIS — M47812 Spondylosis without myelopathy or radiculopathy, cervical region: Secondary | ICD-10-CM | POA: Diagnosis not present

## 2020-09-03 DIAGNOSIS — G894 Chronic pain syndrome: Secondary | ICD-10-CM | POA: Diagnosis not present

## 2020-09-03 DIAGNOSIS — Z79891 Long term (current) use of opiate analgesic: Secondary | ICD-10-CM | POA: Diagnosis not present

## 2020-09-04 ENCOUNTER — Other Ambulatory Visit: Payer: Self-pay | Admitting: Family Medicine

## 2020-09-04 ENCOUNTER — Other Ambulatory Visit: Payer: Self-pay | Admitting: Physical Medicine and Rehabilitation

## 2020-09-04 NOTE — Telephone Encounter (Signed)
Plavix refill request rejected. Pharmacy should send to PCP [facility] or cardiologist.
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
## 2020-09-26 ENCOUNTER — Ambulatory Visit: Payer: Medicare Other

## 2020-09-29 ENCOUNTER — Other Ambulatory Visit: Payer: Self-pay

## 2020-09-29 ENCOUNTER — Ambulatory Visit (INDEPENDENT_AMBULATORY_CARE_PROVIDER_SITE_OTHER): Payer: Medicare Other | Admitting: Student

## 2020-09-29 ENCOUNTER — Encounter: Payer: Self-pay | Admitting: Student

## 2020-09-29 VITALS — BP 153/82 | HR 58 | Ht 65.5 in | Wt 230.0 lb

## 2020-09-29 DIAGNOSIS — I5032 Chronic diastolic (congestive) heart failure: Secondary | ICD-10-CM

## 2020-09-29 DIAGNOSIS — I480 Paroxysmal atrial fibrillation: Secondary | ICD-10-CM | POA: Diagnosis not present

## 2020-09-29 DIAGNOSIS — E785 Hyperlipidemia, unspecified: Secondary | ICD-10-CM | POA: Diagnosis not present

## 2020-09-29 DIAGNOSIS — I10 Essential (primary) hypertension: Secondary | ICD-10-CM | POA: Diagnosis not present

## 2020-09-29 NOTE — Patient Instructions (Signed)

## 2020-10-01 ENCOUNTER — Telehealth: Payer: Self-pay | Admitting: Student

## 2020-10-01 NOTE — Telephone Encounter (Signed)
   I saw patient in clinic on 09/29/2020. Discussed with Dr. [name] Found about stopping Plavix. He agreed that this would be fine. Called and notified patient that it was OK to stop Plavix. Will update medication list.  Darreld Mclean, PA-C 10/01/2020 8:54 AM

## 2020-10-02 ENCOUNTER — Ambulatory Visit: Payer: Medicare Other | Admitting: Family Medicine

## 2020-10-03 ENCOUNTER — Telehealth: Payer: Self-pay | Admitting: Cardiology

## 2020-10-03 NOTE — Telephone Encounter (Signed)
Patient c/o Palpitations:  High priority if patient c/o lightheadedness, shortness of breath, or chest pain  1) How long have you had palpitations/irregular HR/ Afib? Are you having the symptoms now? Has been occurring for 12 hrs is still having symptoms now   2) Are you currently experiencing lightheadedness, SOB or CP? No   3) Do you have a history of afib (atrial fibrillation) or irregular heart rhythm? Yes   4) Have you checked your BP or HR? (document readings if available): HR about 100 dropped to 60 then back to 100   5) Are you experiencing any other symptoms? Chest fluttering   Nicole Norton is calling stating she is having a long episode of Afib and wants to know if she should be concerned. She states she exerted herself more than usual yesterday so it maybe due to that. Please advise.

## 2020-10-03 NOTE — Telephone Encounter (Signed)
States AFib started about MN last night. HR "bouncing around from 60s-110s" BP 143/81 Denies any symptoms such as CP, SOB, DOE, dizziness, syncope. She only feels the abn rhythm. Discuss w/ DOD, Dr. Acie Fredrickson. Advised pt to take extra Toprol 25 mg.  Aware she can take is BID over the w/e if she prefers, per Dr. Acie Fredrickson. Pt aware I will discuss f/u w/ Camnitz next week and let her know. She is agreeable to plan

## 2020-10-04 ENCOUNTER — Other Ambulatory Visit: Payer: Self-pay | Admitting: Cardiology

## 2020-10-06 DIAGNOSIS — M47812 Spondylosis without myelopathy or radiculopathy, cervical region: Secondary | ICD-10-CM | POA: Diagnosis not present

## 2020-10-06 DIAGNOSIS — G894 Chronic pain syndrome: Secondary | ICD-10-CM | POA: Diagnosis not present

## 2020-10-06 DIAGNOSIS — M170 Bilateral primary osteoarthritis of knee: Secondary | ICD-10-CM | POA: Diagnosis not present

## 2020-10-06 DIAGNOSIS — M47817 Spondylosis without myelopathy or radiculopathy, lumbosacral region: Secondary | ICD-10-CM | POA: Diagnosis not present

## 2020-10-06 NOTE — Telephone Encounter (Signed)
Pt requesting 20mg  xarelto but w/ccr of 46.9 she would only qualify for 15mg  only... will route to the pharmd pool for further review 90f 104.3kg Scr 1.68 05/28/20 ccr 46.9 Lovw/[facility] 09/29/20 Primary cardiologist is berry though

## 2020-10-07 ENCOUNTER — Ambulatory Visit (INDEPENDENT_AMBULATORY_CARE_PROVIDER_SITE_OTHER): Payer: Medicare Other | Admitting: Family Medicine

## 2020-10-07 ENCOUNTER — Other Ambulatory Visit: Payer: Self-pay

## 2020-10-07 ENCOUNTER — Encounter: Payer: Self-pay | Admitting: Family Medicine

## 2020-10-07 VITALS — BP 128/72 | HR 76 | Ht 66.0 in | Wt 232.0 lb

## 2020-10-07 DIAGNOSIS — F4321 Adjustment disorder with depressed mood: Secondary | ICD-10-CM

## 2020-10-07 DIAGNOSIS — N1832 Chronic kidney disease, stage 3b: Secondary | ICD-10-CM | POA: Diagnosis not present

## 2020-10-07 DIAGNOSIS — E039 Hypothyroidism, unspecified: Secondary | ICD-10-CM

## 2020-10-07 DIAGNOSIS — D508 Other iron deficiency anemias: Secondary | ICD-10-CM

## 2020-10-07 DIAGNOSIS — I4891 Unspecified atrial fibrillation: Secondary | ICD-10-CM

## 2020-10-07 DIAGNOSIS — I10 Essential (primary) hypertension: Secondary | ICD-10-CM

## 2020-10-07 DIAGNOSIS — E538 Deficiency of other specified B group vitamins: Secondary | ICD-10-CM | POA: Diagnosis not present

## 2020-10-07 DIAGNOSIS — M301 Polyarteritis with lung involvement [Churg-Strauss]: Secondary | ICD-10-CM | POA: Diagnosis not present

## 2020-10-07 DIAGNOSIS — D7218 Eosinophilia in diseases classified elsewhere: Secondary | ICD-10-CM

## 2020-10-07 NOTE — Assessment & Plan Note (Signed)
Is actually doing well right now.  Only on oxygen at night.  Continuing Nucala and prednisone.

## 2020-10-07 NOTE — Telephone Encounter (Signed)
Pt reports doing much better since we last spoke. States is stopped last Friday, had some palpitations on Saturday, but nothing since. Pt scheduled for virtual follow up (ok per Camnitz) in January (scheduling conflicts wouldn't allow for December).  Virtual visit agreeable, pt aware to have VS ready on day of visit.
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
## 2020-10-07 NOTE — Assessment & Plan Note (Signed)
Recently had an episode of atrial fibrillation that lasted a couple of days but she is back in sinus rhythm and doing well.

## 2020-10-07 NOTE — Assessment & Plan Note (Signed)
Pressure looks phenomenal today.  No sign of significant volume overload.

## 2020-10-07 NOTE — Telephone Encounter (Signed)
  Patient Consent for Virtual Visit         Nicole Norton has provided verbal consent on 10/07/2020 for a virtual visit (video or telephone).   CONSENT FOR VIRTUAL VISIT FOR:  Nicole Norton  By participating in this virtual visit I agree to the following:  I hereby voluntarily request, consent and authorize [facility] and its employed or contracted physicians, physician assistants, nurse practitioners or other licensed health care professionals (the Practitioner), to provide me with telemedicine health care services (the "Services") as deemed necessary by the treating Practitioner. I acknowledge and consent to receive the Services by the Practitioner via telemedicine. I understand that the telemedicine visit will involve communicating with the Practitioner through live audiovisual communication technology and the disclosure of certain medical information by electronic transmission. I acknowledge that I have been given the opportunity to request an in-person assessment or other available alternative prior to the telemedicine visit and am voluntarily participating in the telemedicine visit.  I understand that I have the right to withhold or withdraw my consent to the use of telemedicine in the course of my care at any time, without affecting my right to future care or treatment, and that the Practitioner or I may terminate the telemedicine visit at any time. I understand that I have the right to inspect all information obtained and/or recorded in the course of the telemedicine visit and may receive copies of available information for a reasonable fee.  I understand that some of the potential risks of receiving the Services via telemedicine include:  [name] Delay or interruption in medical evaluation due to technological equipment failure or disruption; . Information transmitted may not be sufficient (e.g. poor resolution of images) to allow for appropriate medical decision making by  the Practitioner; and/or  . In rare instances, security protocols could fail, causing a breach of personal health information.  Furthermore, I acknowledge that it is my responsibility to provide information about my medical history, conditions and care that is complete and accurate to the best of my ability. I acknowledge that Practitioner's advice, recommendations, and/or decision may be based on factors not within their control, such as incomplete or inaccurate data provided by me or distortions of diagnostic images or specimens that may result from electronic transmissions. I understand that the practice of medicine is not an exact science and that Practitioner makes no warranties or guarantees regarding treatment outcomes. I acknowledge that a copy of this consent can be made available to me via my patient portal ([facility]), or I can request a printed copy by calling the office of [facility].    I understand that my insurance will be billed for this visit.   I have read or had this consent read to me. . I understand the contents of this consent, which adequately explains the benefits and risks of the Services being provided via telemedicine.  . I have been provided ample opportunity to ask questions regarding this consent and the Services and have had my questions answered to my satisfaction. . I give my informed consent for the services to be provided through the use of telemedicine in my medical care

## 2020-10-07 NOTE — Assessment & Plan Note (Signed)
Due to recheck renal function since hospitalization back in July.

## 2020-10-07 NOTE — Assessment & Plan Note (Signed)
Due to recheck thyroid level.  

## 2020-10-08 LAB — COMPLETE METABOLIC PANEL WITH GFR
AG Ratio: 1.6 (calc) (ref 1.0–2.5)
ALT: 11 U/L (ref 6–29)
AST: 13 U/L (ref 10–35)
Albumin: 4.1 g/dL (ref 3.6–5.1)
Alkaline phosphatase (APISO): 69 U/L (ref 37–153)
BUN/Creatinine Ratio: 21 (calc) (ref 6–22)
BUN: 31 mg/dL — ABNORMAL HIGH (ref 7–25)
CO2: 27 mmol/L (ref 20–32)
Calcium: 9.6 mg/dL (ref 8.6–10.4)
Chloride: 105 mmol/L (ref 98–110)
Creat: 1.51 mg/dL — ABNORMAL HIGH (ref 0.60–0.93)
GFR, Est African American: 39 mL/min/{1.73_m2} — ABNORMAL LOW (ref >=60)
GFR, Est Non African American: 33 mL/min/{1.73_m2} — ABNORMAL LOW (ref >=60)
Globulin: 2.5 g/dL (calc) (ref 1.9–3.7)
Glucose, Bld: 107 mg/dL — ABNORMAL HIGH (ref 65–99)
Potassium: 4.4 mmol/L (ref 3.5–5.3)
Sodium: 142 mmol/L (ref 135–146)
Total Bilirubin: 0.4 mg/dL (ref 0.2–1.2)
Total Protein: 6.6 g/dL (ref 6.1–8.1)

## 2020-10-08 LAB — VITAMIN B12: Vitamin B-12: 324 pg/mL (ref 200–1100)

## 2020-10-08 LAB — IRON,TIBC AND FERRITIN PANEL
%SAT: 25 % (calc) (ref 16–45)
Ferritin: 53 ng/mL (ref 16–288)
Iron: 101 ug/dL (ref 45–160)
TIBC: 400 mcg/dL (calc) (ref 250–450)

## 2020-10-08 LAB — CBC
HCT: 41.9 % (ref 35.0–45.0)
Hemoglobin: 14.1 g/dL (ref 11.7–15.5)
MCH: 33.2 pg — ABNORMAL HIGH (ref 27.0–33.0)
MCHC: 33.7 g/dL (ref 32.0–36.0)
MCV: 98.6 fL (ref 80.0–100.0)
MPV: 11.5 fL (ref 7.5–12.5)
Platelets: 277 10*3/uL (ref 140–400)
RBC: 4.25 10*6/uL (ref 3.80–5.10)
RDW: 12.8 % (ref 11.0–15.0)
WBC: 13.7 10*3/uL — ABNORMAL HIGH (ref 3.8–10.8)

## 2020-10-08 LAB — TSH: TSH: 0.18 mIU/L — ABNORMAL LOW (ref 0.40–4.50)

## 2020-10-08 NOTE — Telephone Encounter (Signed)
Pt had labs drawn yesterday Actual Body weigh crcl is 52 ml/min No dose reduction is needed

## 2020-10-17 ENCOUNTER — Other Ambulatory Visit: Payer: Self-pay | Admitting: *Deleted

## 2020-10-17 DIAGNOSIS — E039 Hypothyroidism, unspecified: Secondary | ICD-10-CM

## 2020-11-03 ENCOUNTER — Telehealth: Payer: Self-pay | Admitting: Cardiology

## 2020-11-03 DIAGNOSIS — M47812 Spondylosis without myelopathy or radiculopathy, cervical region: Secondary | ICD-10-CM | POA: Diagnosis not present

## 2020-11-03 DIAGNOSIS — G894 Chronic pain syndrome: Secondary | ICD-10-CM | POA: Diagnosis not present

## 2020-11-03 DIAGNOSIS — M47817 Spondylosis without myelopathy or radiculopathy, lumbosacral region: Secondary | ICD-10-CM | POA: Diagnosis not present

## 2020-11-03 DIAGNOSIS — M170 Bilateral primary osteoarthritis of knee: Secondary | ICD-10-CM | POA: Diagnosis not present

## 2020-11-03 NOTE — Telephone Encounter (Signed)
Pt reports that she has been in afib for about a month/more now. We spoke one month ago d/t having some palpitations, but they stopped. They started again not long after we spoke, states that she has "been so busy recently (husband passed in September, holidays, etc) and hasn't had time to deal with it/pay attention.  BPs stable, HRs 110-120s.  Denies CP, SOB, weight gain/edema, dizziness or light headedness. Aware I will forward to the Afib clinic to see if they can see her this week (Dr. Curt Bears out of the office this week) to further evaluate, may need DCCV.   She is aware that if they cannot get her in they will let me know and I will see if we can get her in w/ EP APP in our office. Pt is agreeable to plan.

## 2020-11-03 NOTE — Telephone Encounter (Signed)
Patient c/o Palpitations:  High priority if patient c/o lightheadedness, shortness of breath, or chest pain  1) How long have you had palpitations/irregular HR/ Afib? Are you having the symptoms now? Yes  2) Are you currently experiencing lightheadedness, SOB or CP? No  3) Do you have a history of afib (atrial fibrillation) or irregular heart rhythm? Yes  4) Have you checked your BP or HR? (document readings if available): 126/80; 113  5) Are you experiencing any other symptoms? Feel palpitations in chest, no energy

## 2020-11-04 ENCOUNTER — Encounter (HOSPITAL_COMMUNITY): Payer: Self-pay | Admitting: Nurse Practitioner

## 2020-11-04 ENCOUNTER — Ambulatory Visit (HOSPITAL_COMMUNITY)
Admission: RE | Admit: 2020-11-04 | Discharge: 2020-11-04 | Disposition: A | Discharge status: Home / Self Care | Payer: Medicare Other | Source: Ambulatory Visit | Attending: Nurse Practitioner | Admitting: Nurse Practitioner

## 2020-11-04 ENCOUNTER — Other Ambulatory Visit (HOSPITAL_COMMUNITY): Payer: Self-pay | Admitting: Nurse Practitioner

## 2020-11-04 ENCOUNTER — Other Ambulatory Visit: Payer: Self-pay

## 2020-11-04 VITALS — BP 150/90 | HR 117 | Ht 66.0 in | Wt 230.6 lb

## 2020-11-04 DIAGNOSIS — Z87891 Personal history of nicotine dependence: Secondary | ICD-10-CM | POA: Insufficient documentation

## 2020-11-04 DIAGNOSIS — I110 Hypertensive heart disease with heart failure: Secondary | ICD-10-CM | POA: Diagnosis not present

## 2020-11-04 DIAGNOSIS — D6869 Other thrombophilia: Secondary | ICD-10-CM | POA: Diagnosis not present

## 2020-11-04 DIAGNOSIS — I4819 Other persistent atrial fibrillation: Secondary | ICD-10-CM | POA: Diagnosis not present

## 2020-11-04 DIAGNOSIS — I5042 Chronic combined systolic (congestive) and diastolic (congestive) heart failure: Secondary | ICD-10-CM | POA: Diagnosis not present

## 2020-11-04 DIAGNOSIS — Z7901 Long term (current) use of anticoagulants: Secondary | ICD-10-CM | POA: Insufficient documentation

## 2020-11-04 DIAGNOSIS — I480 Paroxysmal atrial fibrillation: Secondary | ICD-10-CM

## 2020-11-04 DIAGNOSIS — I4892 Unspecified atrial flutter: Secondary | ICD-10-CM | POA: Diagnosis not present

## 2020-11-04 DIAGNOSIS — I4891 Unspecified atrial fibrillation: Secondary | ICD-10-CM | POA: Diagnosis present

## 2020-11-04 DIAGNOSIS — E785 Hyperlipidemia, unspecified: Secondary | ICD-10-CM | POA: Insufficient documentation

## 2020-11-04 LAB — BASIC METABOLIC PANEL
Anion gap: 15 (ref 5–15)
BUN: 26 mg/dL — ABNORMAL HIGH (ref 8–23)
CO2: 26 mmol/L (ref 22–32)
Calcium: 9.1 mg/dL (ref 8.9–10.3)
Chloride: 101 mmol/L (ref 98–111)
Creatinine, Ser: 1.53 mg/dL — ABNORMAL HIGH (ref 0.44–1.00)
GFR, Estimated: 35 mL/min — ABNORMAL LOW (ref >60)
Glucose, Bld: 115 mg/dL — ABNORMAL HIGH (ref 70–99)
Potassium: 4 mmol/L (ref 3.5–5.1)
Sodium: 142 mmol/L (ref 135–145)

## 2020-11-04 LAB — CBC
HCT: 43.5 % (ref 36.0–46.0)
Hemoglobin: 13.8 g/dL (ref 12.0–15.0)
MCH: 32.4 pg (ref 26.0–34.0)
MCHC: 31.7 g/dL (ref 30.0–36.0)
MCV: 102.1 fL — ABNORMAL HIGH (ref 80.0–100.0)
Platelets: 250 10*3/uL (ref 150–400)
RBC: 4.26 MIL/uL (ref 3.87–5.11)
RDW: 14.5 % (ref 11.5–15.5)
WBC: 12.2 10*3/uL — ABNORMAL HIGH (ref 4.0–10.5)
nRBC: 0 % (ref 0.0–0.2)
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
## 2020-11-04 NOTE — Patient Instructions (Addendum)
Increase Amiodarone 200mg - Taking one tablet by mouth twice daily Morning of Cardioversion - go back to Amiodarone 200mg  once daily    Cardioversion scheduled for December 30th @ 9:00am   - Arrive at the [facility] and go to admitting at [facility] not eat or drink anything after midnight the night prior to your procedure.  - Take all your morning medication (except diabetic medications) with a sip of water prior to arrival.  - You will not be able to drive home after your procedure.  - Do NOT miss any doses of your blood thinner - if you should miss a dose please notify our office immediately.  - If you feel as if you go back into normal rhythm prior to scheduled cardioversion, please notify our office immediately. If your procedure is canceled in the cardioversion suite you will be charged a cancellation fee. I
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
## 2020-11-05 DIAGNOSIS — H26492 Other secondary cataract, left eye: Secondary | ICD-10-CM | POA: Diagnosis not present

## 2020-11-05 DIAGNOSIS — H5203 Hypermetropia, bilateral: Secondary | ICD-10-CM | POA: Diagnosis not present

## 2020-11-06 ENCOUNTER — Ambulatory Visit: Payer: Medicare Other

## 2020-11-11 ENCOUNTER — Other Ambulatory Visit (HOSPITAL_COMMUNITY)
Admission: RE | Admit: 2020-11-11 | Discharge: 2020-11-11 | Disposition: A | Discharge status: Home / Self Care | Payer: Medicare Other | Source: Ambulatory Visit | Attending: Internal Medicine | Admitting: Internal Medicine

## 2020-11-11 DIAGNOSIS — Z01812 Encounter for preprocedural laboratory examination: Secondary | ICD-10-CM | POA: Diagnosis not present

## 2020-11-11 DIAGNOSIS — Z20822 Contact with and (suspected) exposure to covid-19: Secondary | ICD-10-CM | POA: Diagnosis not present

## 2020-11-12 LAB — SARS CORONAVIRUS 2 (TAT 6-24 HRS): SARS Coronavirus 2: NEGATIVE

## 2020-11-13 ENCOUNTER — Other Ambulatory Visit: Payer: Self-pay

## 2020-11-13 ENCOUNTER — Ambulatory Visit (HOSPITAL_COMMUNITY)
Admission: RE | Admit: 2020-11-13 | Discharge: 2020-11-13 | Disposition: A | Discharge status: Home / Self Care | Payer: Medicare Other | Attending: Internal Medicine | Admitting: Internal Medicine

## 2020-11-13 ENCOUNTER — Ambulatory Visit (HOSPITAL_COMMUNITY): Payer: Medicare Other | Admitting: Anesthesiology

## 2020-11-13 ENCOUNTER — Encounter (HOSPITAL_COMMUNITY): Payer: Self-pay | Admitting: Internal Medicine

## 2020-11-13 ENCOUNTER — Encounter (HOSPITAL_COMMUNITY)
Admission: RE | Disposition: A | Discharge status: Home / Self Care | Payer: Self-pay | Source: Home / Self Care | Attending: Internal Medicine

## 2020-11-13 DIAGNOSIS — Z7901 Long term (current) use of anticoagulants: Secondary | ICD-10-CM | POA: Diagnosis not present

## 2020-11-13 DIAGNOSIS — I4891 Unspecified atrial fibrillation: Secondary | ICD-10-CM

## 2020-11-13 DIAGNOSIS — E785 Hyperlipidemia, unspecified: Secondary | ICD-10-CM | POA: Diagnosis not present

## 2020-11-13 DIAGNOSIS — I4892 Unspecified atrial flutter: Secondary | ICD-10-CM | POA: Insufficient documentation

## 2020-11-13 DIAGNOSIS — Z880 Allergy status to penicillin: Secondary | ICD-10-CM | POA: Insufficient documentation

## 2020-11-13 DIAGNOSIS — Z79899 Other long term (current) drug therapy: Secondary | ICD-10-CM | POA: Insufficient documentation

## 2020-11-13 DIAGNOSIS — I10 Essential (primary) hypertension: Secondary | ICD-10-CM | POA: Diagnosis not present

## 2020-11-13 DIAGNOSIS — Z888 Allergy status to other drugs, medicaments and biological substances status: Secondary | ICD-10-CM | POA: Diagnosis not present

## 2020-11-13 DIAGNOSIS — I5042 Chronic combined systolic (congestive) and diastolic (congestive) heart failure: Secondary | ICD-10-CM | POA: Diagnosis not present

## 2020-11-13 DIAGNOSIS — I4819 Other persistent atrial fibrillation: Secondary | ICD-10-CM | POA: Diagnosis not present

## 2020-11-13 DIAGNOSIS — Z79890 Hormone replacement therapy (postmenopausal): Secondary | ICD-10-CM | POA: Diagnosis not present

## 2020-11-13 DIAGNOSIS — F418 Other specified anxiety disorders: Secondary | ICD-10-CM | POA: Diagnosis not present

## 2020-11-13 DIAGNOSIS — Z87891 Personal history of nicotine dependence: Secondary | ICD-10-CM | POA: Insufficient documentation

## 2020-11-13 HISTORY — PX: CARDIOVERSION: SHX1299

## 2020-11-13 SURGERY — CARDIOVERSION
Anesthesia: General

## 2020-11-13 MED ORDER — AMIODARONE HCL 200 MG PO TABS: 200.0000 mg | ORAL_TABLET | Freq: Every day | ORAL | 3 refills | Status: DC

## 2020-11-13 MED ORDER — PROPOFOL 10 MG/ML IV BOLUS
INTRAVENOUS | Status: DC | PRN
Start: 2020-11-13 — End: 2020-11-13
  Administered 2020-11-13: 50 mg via INTRAVENOUS
  Administered 2020-11-13: 20 mg via INTRAVENOUS

## 2020-11-13 MED ORDER — SODIUM CHLORIDE 0.9 % IV SOLN: INTRAVENOUS | Status: DC

## 2020-11-13 MED ORDER — LIDOCAINE HCL (CARDIAC) PF 100 MG/5ML IV SOSY
PREFILLED_SYRINGE | INTRAVENOUS | Status: DC | PRN
  Administered 2020-11-13: 60 mg via INTRAVENOUS

## 2020-11-13 NOTE — Anesthesia Procedure Notes (Signed)
Procedure Name: General with mask airway Date/Time: 11/13/2020 8:34 AM Performed by: Marena Chancy, CRNA Pre-anesthesia Checklist: Patient identified, Emergency Drugs available, Suction available, Patient being monitored and Timeout performed Patient Re-evaluated:Patient Re-evaluated prior to induction Oxygen Delivery Method: Ambu bag Preoxygenation: Pre-oxygenation with 100% oxygen Induction Type: IV induction

## 2020-11-13 NOTE — CV Procedure (Signed)
   Electrical Cardioversion Procedure Note Nicole Norton [id number] 1941/01/09  Procedure: Electrical Cardioversion Indications:  Atrial Fibrillation  Time Out: Verified patient identification, verified procedure,medications/allergies/relevent history reviewed, required imaging and test results available.  Performed  Procedure Details  The patient was NPO after midnight. Anesthesia was administered at the beside  by Dr. Salvadore Farber with 70 mg of propofol and 60 mg lidocaine.  Cardioversion was done with synchronized biphasic defibrillation with AP pads with 200 J.  The patient converted to normal sinus rhythm. The patient tolerated the procedure well   IMPRESSION:  Successful cardioversion of atrial fibrillation    Gorman Safi A Rodel Glaspy 11/13/2020, 8:45 AM

## 2020-11-13 NOTE — Anesthesia Preprocedure Evaluation (Addendum)
Anesthesia Evaluation  Patient identified by MRN, date of birth, ID band Patient awake    Reviewed: Allergy & Precautions, NPO status , Patient's Chart, lab work & pertinent test results  Airway Mallampati: III  TM Distance: >3 FB Neck ROM: Full    Dental  (+) Teeth Intact, Dental Advisory Given   Pulmonary former smoker,  Quit smoking 2001, 20 pack year history    Pulmonary exam normal breath sounds clear to auscultation       Cardiovascular hypertension, Pt. on medications and Pt. on home beta blockers +CHF (grade 2 diastolic dysfunction)  Normal cardiovascular exam+ dysrhythmias (amio, metoprolol, xarelto s/p 2 prior cardioversions) Atrial Fibrillation + Valvular Problems/Murmurs (mild MR, mild TR) MR  Rhythm:Irregular Rate:Normal  Echo 05/2020: 1. Technically difficult; LV function appears low normal; grade 2  diastolic dysfunction; mild LVE; mild MR and TR.  2. Left ventricular ejection fraction, by estimation, is 50 to 55%. The  left ventricle has low normal function. The left ventricle has no regional  wall motion abnormalities. The left ventricular internal cavity size was  mildly dilated. Left ventricular  diastolic parameters are consistent with Grade II diastolic dysfunction  (pseudonormalization).  3. Right ventricular systolic function is normal. The right ventricular  size is normal. There is mildly elevated pulmonary artery systolic  pressure.  4. The mitral valve is normal in structure. Mild mitral valve  regurgitation. No evidence of mitral stenosis.  5. The aortic valve is tricuspid. Aortic valve regurgitation is not  visualized. Mild aortic valve sclerosis is present, with no evidence of  aortic valve stenosis.  6. The inferior vena cava is normal in size with greater than 50%  respiratory variability, suggesting right atrial pressure of 3 mmHg.    Neuro/Psych PSYCHIATRIC DISORDERS Anxiety Depression  negative neurological ROS     GI/Hepatic Neg liver ROS, GERD  Controlled,  Endo/Other  Hypothyroidism Obesity BMI 37  Renal/GU Renal InsufficiencyRenal diseaseCr 1.53  negative genitourinary   Musculoskeletal  (+) Arthritis , Osteoarthritis,    Abdominal   Peds  Hematology negative hematology ROS (+)   Anesthesia Other Findings   Reproductive/Obstetrics negative OB ROS                            Anesthesia Physical Anesthesia Plan  ASA: III  Anesthesia Plan: General   Post-op Pain Management:    Induction: Intravenous  PONV Risk Score and Plan: 3 and Treatment may vary due to age or medical condition and TIVA  Airway Management Planned: Mask and Natural Airway  Additional Equipment: None  Intra-op Plan:   Post-operative Plan:   Informed Consent: I have reviewed the patients History and Physical, chart, labs and discussed the procedure including the risks, benefits and alternatives for the proposed anesthesia with the patient or authorized representative who has indicated his/her understanding and acceptance.     Dental advisory given  Plan Discussed with: CRNA  Anesthesia Plan Comments:        Anesthesia Quick Evaluation

## 2020-11-13 NOTE — Transfer of Care (Signed)
Immediate Anesthesia Transfer of Care Note  Patient: Nicole Norton  Procedure(s) Performed: CARDIOVERSION (N/A )  Patient Location: Endoscopy Unit  Anesthesia Type:General  Level of Consciousness: awake, alert  and oriented  Airway & Oxygen Therapy: Patient Spontanous Breathing  Post-op Assessment: Report given to RN and Post -op Vital signs reviewed and stable  Post vital signs: Reviewed and stable  Last Vitals:  Vitals Value Taken Time  BP    Temp    Pulse    Resp    SpO2      Last Pain:  Vitals:   11/13/20 0756  TempSrc: Oral  PainSc: 0-No pain         Complications: No complications documented.

## 2020-11-13 NOTE — Interval H&P Note (Signed)
History and Physical Interval Note:  11/13/2020 8:34 AM  Nicole Norton  has presented today for surgery, with the diagnosis of AFIB.  The various methods of treatment have been discussed with the patient and family. After consideration of risks, benefits and other options for treatment, the patient has consented to  Procedure(s): CARDIOVERSION (N/A) as a surgical intervention.  The patient's history has been reviewed, patient examined, no change in status, stable for surgery.  I have reviewed the patient's chart and labs.  Questions were answered to the patient's satisfaction.     Alera Quevedo A Leo Fray

## 2020-11-13 NOTE — Anesthesia Postprocedure Evaluation (Signed)
Anesthesia Post Note  Patient: Rexene Edison  Procedure(s) Performed: CARDIOVERSION (N/A )     Patient location during evaluation: PACU Anesthesia Type: General Level of consciousness: awake and alert, oriented and patient cooperative Pain management: pain level controlled Vital Signs Assessment: post-procedure vital signs reviewed and stable Respiratory status: spontaneous breathing, nonlabored ventilation and respiratory function stable Cardiovascular status: blood pressure returned to baseline and stable Postop Assessment: no apparent nausea or vomiting Anesthetic complications: no   No complications documented.  Last Vitals:  Vitals:   11/13/20 0844 11/13/20 0855  BP: 127/82 (!) 160/96  Pulse: 61 63  Resp: 18 17  Temp: 36.5 C   SpO2: 96% 94%    Last Pain:  Vitals:   11/13/20 0855  TempSrc:   PainSc: 0-No pain                 Lannie Fields

## 2020-11-18 ENCOUNTER — Telehealth: Payer: Self-pay | Admitting: Cardiology

## 2020-11-18 NOTE — Telephone Encounter (Signed)
Discussed with Jorja Loa PA will increase amiodarone back to 200mg  BID and increase metoprolol to 25mg  BID. BP 150/90 currently.  Pt in agreement. Follow up as scheduled.

## 2020-11-18 NOTE — Telephone Encounter (Signed)
Patient c/o Palpitations:  High priority if patient c/o lightheadedness, shortness of breath, or chest pain  1) How long have you had palpitations/irregular HR/ Afib? Are you having the symptoms now? Started on 11/15/20  and is having symptoms now   2) Are you currently experiencing lightheadedness, SOB or CP? No   3) Do you have a history of afib (atrial fibrillation) or irregular heart rhythm? Yes   4) Have you checked your BP or HR? (document readings if available): HR 124   5) Are you experiencing any other symptoms? Nicole Norton is calling stating her cardioversion did not work and she is back in Afib. Last time she had this performed she had to have it done twice in order for it to last for a year. Please advise.

## 2020-11-20 ENCOUNTER — Encounter (HOSPITAL_COMMUNITY): Payer: Self-pay | Admitting: Physician Assistant

## 2020-11-20 ENCOUNTER — Ambulatory Visit (HOSPITAL_COMMUNITY)
Admission: RE | Admit: 2020-11-20 | Discharge: 2020-11-20 | Disposition: A | Discharge status: Home / Self Care | Payer: Medicare Other | Source: Ambulatory Visit | Attending: Physician Assistant | Admitting: Physician Assistant

## 2020-11-20 ENCOUNTER — Other Ambulatory Visit: Payer: Self-pay

## 2020-11-20 VITALS — BP 124/82 | HR 115 | Ht 66.0 in | Wt 229.4 lb

## 2020-11-20 DIAGNOSIS — I484 Atypical atrial flutter: Secondary | ICD-10-CM | POA: Diagnosis not present

## 2020-11-20 DIAGNOSIS — I110 Hypertensive heart disease with heart failure: Secondary | ICD-10-CM | POA: Diagnosis not present

## 2020-11-20 DIAGNOSIS — D6869 Other thrombophilia: Secondary | ICD-10-CM

## 2020-11-20 DIAGNOSIS — Z7901 Long term (current) use of anticoagulants: Secondary | ICD-10-CM | POA: Insufficient documentation

## 2020-11-20 DIAGNOSIS — I5042 Chronic combined systolic (congestive) and diastolic (congestive) heart failure: Secondary | ICD-10-CM | POA: Diagnosis not present

## 2020-11-20 DIAGNOSIS — Z880 Allergy status to penicillin: Secondary | ICD-10-CM | POA: Diagnosis not present

## 2020-11-20 DIAGNOSIS — Z79899 Other long term (current) drug therapy: Secondary | ICD-10-CM | POA: Diagnosis not present

## 2020-11-20 DIAGNOSIS — I4892 Unspecified atrial flutter: Secondary | ICD-10-CM | POA: Insufficient documentation

## 2020-11-20 DIAGNOSIS — Z885 Allergy status to narcotic agent status: Secondary | ICD-10-CM | POA: Diagnosis not present

## 2020-11-20 DIAGNOSIS — Z888 Allergy status to other drugs, medicaments and biological substances status: Secondary | ICD-10-CM | POA: Insufficient documentation

## 2020-11-20 DIAGNOSIS — I4819 Other persistent atrial fibrillation: Secondary | ICD-10-CM | POA: Insufficient documentation

## 2020-11-20 DIAGNOSIS — Z87891 Personal history of nicotine dependence: Secondary | ICD-10-CM | POA: Diagnosis not present

## 2020-11-20 DIAGNOSIS — Z8249 Family history of ischemic heart disease and other diseases of the circulatory system: Secondary | ICD-10-CM | POA: Diagnosis not present

## 2020-11-20 LAB — CBC
HCT: 46 % (ref 36.0–46.0)
Hemoglobin: 14.7 g/dL (ref 12.0–15.0)
MCH: 33.3 pg (ref 26.0–34.0)
MCHC: 32 g/dL (ref 30.0–36.0)
MCV: 104.3 fL — ABNORMAL HIGH (ref 80.0–100.0)
Platelets: 239 10*3/uL (ref 150–400)
RBC: 4.41 MIL/uL (ref 3.87–5.11)
RDW: 14.5 % (ref 11.5–15.5)
WBC: 10.7 10*3/uL — ABNORMAL HIGH (ref 4.0–10.5)
nRBC: 0 % (ref 0.0–0.2)

## 2020-11-20 LAB — BASIC METABOLIC PANEL
Anion gap: 14 (ref 5–15)
BUN: 31 mg/dL — ABNORMAL HIGH (ref 8–23)
CO2: 28 mmol/L (ref 22–32)
Calcium: 9.4 mg/dL (ref 8.9–10.3)
Chloride: 104 mmol/L (ref 98–111)
Creatinine, Ser: 1.84 mg/dL — ABNORMAL HIGH (ref 0.44–1.00)
GFR, Estimated: 28 mL/min — ABNORMAL LOW (ref >60)
Glucose, Bld: 88 mg/dL (ref 70–99)
Potassium: 3.6 mmol/L (ref 3.5–5.1)
Sodium: 146 mmol/L — ABNORMAL HIGH (ref 135–145)

## 2020-11-20 NOTE — Patient Instructions (Addendum)
Cardioversion scheduled for Thursday, January 13th  - Arrive at the [facility] and go to admitting at 11AM  - Do not eat or drink anything after midnight the night prior to your procedure.  - Take all your morning medication (except diabetic medications) with a sip of water prior to arrival.  - You will not be able to drive home after your procedure.  - Do NOT miss any doses of your blood thinner - if you should miss a dose please notify our office immediately.  - If you feel as if you go back into normal rhythm prior to scheduled cardioversion, please notify our office immediately. If your procedure is canceled in the cardioversion suite you will be charged a cancellation fee.  Day before cardioversion reduce amiodarone and metoprolol back to once a day
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
## 2020-11-24 ENCOUNTER — Encounter: Payer: Self-pay | Admitting: Cardiology

## 2020-11-24 ENCOUNTER — Other Ambulatory Visit: Payer: Self-pay

## 2020-11-24 ENCOUNTER — Telehealth (INDEPENDENT_AMBULATORY_CARE_PROVIDER_SITE_OTHER): Payer: Medicare Other | Admitting: Cardiology

## 2020-11-24 VITALS — BP 125/76 | HR 101

## 2020-11-24 DIAGNOSIS — I4819 Other persistent atrial fibrillation: Secondary | ICD-10-CM
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
## 2020-11-25 ENCOUNTER — Other Ambulatory Visit (HOSPITAL_COMMUNITY)
Admission: RE | Admit: 2020-11-25 | Discharge: 2020-11-25 | Disposition: A | Discharge status: Home / Self Care | Payer: Medicare Other | Source: Ambulatory Visit | Attending: Cardiovascular Disease | Admitting: Cardiovascular Disease

## 2020-11-25 DIAGNOSIS — Z20822 Contact with and (suspected) exposure to covid-19: Secondary | ICD-10-CM | POA: Insufficient documentation

## 2020-11-25 DIAGNOSIS — Z01812 Encounter for preprocedural laboratory examination: Secondary | ICD-10-CM | POA: Insufficient documentation

## 2020-11-25 LAB — SARS CORONAVIRUS 2 (TAT 6-24 HRS): SARS Coronavirus 2: NEGATIVE

## 2020-11-27 ENCOUNTER — Encounter (HOSPITAL_COMMUNITY): Payer: Self-pay | Admitting: Cardiovascular Disease

## 2020-11-27 ENCOUNTER — Ambulatory Visit (HOSPITAL_COMMUNITY)
Admission: RE | Admit: 2020-11-27 | Discharge: 2020-11-27 | Disposition: A | Discharge status: Home / Self Care | Payer: Medicare Other | Source: Ambulatory Visit | Attending: Cardiovascular Disease | Admitting: Cardiovascular Disease

## 2020-11-27 ENCOUNTER — Other Ambulatory Visit: Payer: Self-pay

## 2020-11-27 ENCOUNTER — Encounter (HOSPITAL_COMMUNITY)
Admission: RE | Disposition: A | Discharge status: Home / Self Care | Payer: Self-pay | Source: Ambulatory Visit | Attending: Cardiovascular Disease

## 2020-11-27 ENCOUNTER — Ambulatory Visit (HOSPITAL_COMMUNITY): Payer: Medicare Other | Admitting: Anesthesiology

## 2020-11-27 DIAGNOSIS — Z87891 Personal history of nicotine dependence: Secondary | ICD-10-CM | POA: Diagnosis not present

## 2020-11-27 DIAGNOSIS — Z7952 Long term (current) use of systemic steroids: Secondary | ICD-10-CM | POA: Insufficient documentation

## 2020-11-27 DIAGNOSIS — Z888 Allergy status to other drugs, medicaments and biological substances status: Secondary | ICD-10-CM | POA: Diagnosis not present

## 2020-11-27 DIAGNOSIS — Z880 Allergy status to penicillin: Secondary | ICD-10-CM | POA: Diagnosis not present

## 2020-11-27 DIAGNOSIS — F418 Other specified anxiety disorders: Secondary | ICD-10-CM | POA: Diagnosis not present

## 2020-11-27 DIAGNOSIS — Z885 Allergy status to narcotic agent status: Secondary | ICD-10-CM | POA: Insufficient documentation

## 2020-11-27 DIAGNOSIS — Z7901 Long term (current) use of anticoagulants: Secondary | ICD-10-CM | POA: Insufficient documentation

## 2020-11-27 DIAGNOSIS — Z79890 Hormone replacement therapy (postmenopausal): Secondary | ICD-10-CM | POA: Diagnosis not present

## 2020-11-27 DIAGNOSIS — I4819 Other persistent atrial fibrillation: Secondary | ICD-10-CM | POA: Insufficient documentation

## 2020-11-27 DIAGNOSIS — Z79899 Other long term (current) drug therapy: Secondary | ICD-10-CM | POA: Insufficient documentation

## 2020-11-27 DIAGNOSIS — I4892 Unspecified atrial flutter: Secondary | ICD-10-CM | POA: Insufficient documentation

## 2020-11-27 DIAGNOSIS — I4891 Unspecified atrial fibrillation: Secondary | ICD-10-CM | POA: Diagnosis not present

## 2020-11-27 DIAGNOSIS — I5032 Chronic diastolic (congestive) heart failure: Secondary | ICD-10-CM | POA: Diagnosis not present

## 2020-11-27 DIAGNOSIS — E785 Hyperlipidemia, unspecified: Secondary | ICD-10-CM | POA: Diagnosis not present

## 2020-11-27 DIAGNOSIS — E7800 Pure hypercholesterolemia, unspecified: Secondary | ICD-10-CM | POA: Diagnosis not present

## 2020-11-27 DIAGNOSIS — I110 Hypertensive heart disease with heart failure: Secondary | ICD-10-CM | POA: Diagnosis not present

## 2020-11-27 HISTORY — PX: CARDIOVERSION: SHX1299

## 2020-11-27 SURGERY — CARDIOVERSION
Anesthesia: General

## 2020-11-27 MED ORDER — PROPOFOL 10 MG/ML IV BOLUS
INTRAVENOUS | Status: DC | PRN
  Administered 2020-11-27: 30 mg via INTRAVENOUS
  Administered 2020-11-27: 70 mg via INTRAVENOUS

## 2020-11-27 MED ORDER — LIDOCAINE 2% (20 MG/ML) 5 ML SYRINGE
INTRAMUSCULAR | Status: DC | PRN
  Administered 2020-11-27: 100 mg via INTRAVENOUS

## 2020-11-27 MED ORDER — SODIUM CHLORIDE 0.9 % IV SOLN: INTRAVENOUS | Status: DC | PRN

## 2020-11-27 NOTE — Transfer of Care (Signed)
Immediate Anesthesia Transfer of Care Note  Patient: Nicole Norton  Procedure(s) Performed: CARDIOVERSION (N/A )  Patient Location: Endoscopy Unit  Anesthesia Type:General  Level of Consciousness: awake, alert  and oriented  Airway & Oxygen Therapy: Patient Spontanous Breathing  Post-op Assessment: Report given to RN and Post -op Vital signs reviewed and stable  Post vital signs: Reviewed and stable  Last Vitals:  Vitals Value Taken Time  BP 141/73   Temp    Pulse 56   Resp    SpO2 95     Last Pain:  Vitals:   11/27/20 1125  TempSrc: Oral         Complications: No complications documented.

## 2020-11-27 NOTE — Discharge Instructions (Signed)
Electrical Cardioversion Electrical cardioversion is the delivery of a jolt of electricity to restore a normal rhythm to the heart. A rhythm that is too fast or is not regular keeps the heart from pumping well. In this procedure, sticky patches or metal paddles are placed on the chest to deliver electricity to the heart from a device. This procedure may be done in an emergency if:  There is low or no blood pressure as a result of the heart rhythm.  Normal rhythm must be restored as fast as possible to protect the brain and heart from further damage.  It may save a life. This may also be a scheduled procedure for irregular or fast heart rhythms that are not immediately life-threatening. Tell a health care provider about:  Any allergies you have.  All medicines you are taking, including vitamins, herbs, eye drops, creams, and over-the-counter medicines.  Any problems you or family members have had with anesthetic medicines.  Any blood disorders you have.  Any surgeries you have had.  Any medical conditions you have.  Whether you are pregnant or may be pregnant. What are the risks? Generally, this is a safe procedure. However, problems may occur, including:  Allergic reactions to medicines.  A blood clot that breaks free and travels to other parts of your body.  The possible return of an abnormal heart rhythm within hours or days after the procedure.  Your heart stopping (cardiac arrest). This is rare. What happens before the procedure? Medicines  Your health care provider may have you start taking: ? Blood-thinning medicines (anticoagulants) so your blood does not clot as easily. ? Medicines to help stabilize your heart rate and rhythm.  Ask your health care provider about: ? Changing or stopping your regular medicines. This is especially important if you are taking diabetes medicines or blood thinners. ? Taking medicines such as aspirin and ibuprofen. These medicines can  thin your blood. Do not take these medicines unless your health care provider tells you to take them. ? Taking over-the-counter medicines, vitamins, herbs, and supplements. General instructions  Follow instructions from your health care provider about eating or drinking restrictions.  Plan to have someone take you home from the hospital or clinic.  If you will be going home right after the procedure, plan to have someone with you for 24 hours.  Ask your health care provider what steps will be taken to help prevent infection. These may include washing your skin with a germ-killing soap. What happens during the procedure?  An IV will be inserted into one of your veins.  Sticky patches (electrodes) or metal paddles may be placed on your chest.  You will be given a medicine to help you relax (sedative).  An electrical shock will be delivered. The procedure may vary among health care providers and hospitals.   What can I expect after the procedure?  Your blood pressure, heart rate, breathing rate, and blood oxygen level will be monitored until you leave the hospital or clinic.  Your heart rhythm will be watched to make sure it does not change.  You may have some redness on the skin where the shocks were given. Follow these instructions at home:  Do not drive for 24 hours if you were given a sedative during your procedure.  Take over-the-counter and prescription medicines only as told by your health care provider.  Ask your health care provider how to check your pulse. Check it often.  Rest for 48 hours after the procedure   or as told by your health care provider.  Avoid or limit your caffeine use as told by your health care provider.  Keep all follow-up visits as told by your health care provider. This is important. Contact a health care provider if:  You feel like your heart is beating too quickly or your pulse is not regular.  You have a serious muscle cramp that does not go  away. Get help right away if:  You have discomfort in your chest.  You are dizzy or you feel faint.  You have trouble breathing or you are short of breath.  Your speech is slurred.  You have trouble moving an arm or leg on one side of your body.  Your fingers or toes turn cold or blue. Summary  Electrical cardioversion is the delivery of a jolt of electricity to restore a normal rhythm to the heart.  This procedure may be done right away in an emergency or may be a scheduled procedure if the condition is not an emergency.  Generally, this is a safe procedure.  After the procedure, check your pulse often as told by your health care provider. This information is not intended to replace advice given to you by your health care provider. Make sure you discuss any questions you have with your health care provider. Document Revised: 06/04/2019 Document Reviewed: 06/04/2019 Elsevier Patient Education  2021 [employer] Inc.  

## 2020-11-27 NOTE — Anesthesia Procedure Notes (Signed)
Procedure Name: MAC Date/Time: 11/27/2020 11:42 AM Performed by: Dorthea Cove, CRNA Pre-anesthesia Checklist: Patient identified, Emergency Drugs available, Suction available, Patient being monitored and Timeout performed Patient Re-evaluated:Patient Re-evaluated prior to induction Oxygen Delivery Method: Ambu bag Preoxygenation: Pre-oxygenation with 100% oxygen Induction Type: IV induction Ventilation: Mask ventilation without difficulty Placement Confirmation: positive ETCO2 and CO2 detector Dental Injury: Teeth and Oropharynx as per pre-operative assessment

## 2020-11-27 NOTE — CV Procedure (Signed)
DCC: Anesthesia:  Lidocaine/Propofol No missed doses Xarelto  DCC x 1 200J on amiodarone  Converted to NSR rate 76 bpm  No immediate neurologic sequelae  Jenkins Rouge MD Leonidas Romberg

## 2020-11-27 NOTE — Anesthesia Postprocedure Evaluation (Signed)
Anesthesia Post Note  Patient: Nicole Norton  Procedure(s) Performed: CARDIOVERSION (N/A )     Patient location during evaluation: PACU Anesthesia Type: General Level of consciousness: awake and alert and oriented Pain management: pain level controlled Vital Signs Assessment: post-procedure vital signs reviewed and stable Respiratory status: spontaneous breathing, nonlabored ventilation and respiratory function stable Cardiovascular status: blood pressure returned to baseline and stable Postop Assessment: no apparent nausea or vomiting Anesthetic complications: no   No complications documented.  Last Vitals:  Vitals:   11/27/20 1125 11/27/20 1153  BP: (!) 165/125 (!) 119/91  Pulse: (!) 106 (!) 56  Resp: (!) 23 (!) 21  Temp: 36.7 C 36.6 C  SpO2: 94% 93%    Last Pain:  Vitals:   11/27/20 1153  TempSrc: Oral  PainSc: 0-No pain                 Zayn Selley A.

## 2020-11-27 NOTE — Interval H&P Note (Signed)
History and Physical Interval Note:  11/27/2020 11:47 AM  Nicole Norton  has presented today for surgery, with the diagnosis of AFIB.  The various methods of treatment have been discussed with the patient and family. After consideration of risks, benefits and other options for treatment, the patient has consented to  Procedure(s): CARDIOVERSION (N/A) as a surgical intervention.  The patient's history has been reviewed, patient examined, no change in status, stable for surgery.  I have reviewed the patient's chart and labs.  Questions were answered to the patient's satisfaction.     Jenkins Rouge

## 2020-11-27 NOTE — Anesthesia Preprocedure Evaluation (Signed)
Anesthesia Evaluation  Patient identified by MRN, date of birth, ID band Patient awake    Reviewed: Allergy & Precautions, NPO status , Patient's Chart, lab work & pertinent test results, reviewed documented beta blocker date and time   Airway Mallampati: III  TM Distance: >3 FB Neck ROM: Full    Dental no notable dental hx. (+) Teeth Intact, Dental Advisory Given   Pulmonary pneumonia, resolved, former smoker,  Quit smoking 2001, 20 pack year history    Pulmonary exam normal breath sounds clear to auscultation       Cardiovascular hypertension, Pt. on medications and Pt. on home beta blockers + Past MI and +CHF (grade 2 diastolic dysfunction)  Normal cardiovascular exam+ dysrhythmias (amio, metoprolol, xarelto s/p 2 prior cardioversions) Atrial Fibrillation + Valvular Problems/Murmurs (mild MR, mild TR) MR  Rhythm:Irregular Rate:Normal  EKG 11/20/20 Atrial flutter with variable block, LAD, non specific ST-T wave abnormalities  Echo 05/2020: 1. Technically difficult; LV function appears low normal; grade 2 diastolic dysfunction; mild LVE; mild MR and TR.  2. Left ventricular ejection fraction, by estimation, is 50 to 55%. The left ventricle has low normal function. The left ventricle has no regional wall motion abnormalities. The left ventricular internal cavity size was mildly dilated. Left ventricular diastolic parameters are consistent with Grade II diastolic dysfunction (pseudonormalization).  3. Right ventricular systolic function is normal. The right ventricular size is normal. There is mildly elevated pulmonary artery systolic pressure.  4. The mitral valve is normal in structure. Mild mitral valve  regurgitation. No evidence of mitral stenosis.  5. The aortic valve is tricuspid. Aortic valve regurgitation is not visualized. Mild aortic valve sclerosis is present, with no evidence of aortic valve stenosis.  6. The inferior vena  cava is normal in size with greater than 50% respiratory variability, suggesting right atrial pressure of 3 mmHg.    Neuro/Psych PSYCHIATRIC DISORDERS Anxiety Depression negative neurological ROS     GI/Hepatic Neg liver ROS, GERD  Controlled,  Endo/Other  Hypothyroidism Obesity BMI 37 Hyperlipidemia  Renal/GU Renal InsufficiencyRenal diseaseCr 1.53  negative genitourinary   Musculoskeletal  (+) Arthritis , Osteoarthritis,    Abdominal (+) + obese,   Peds  Hematology  (+) anemia , Eliquis therapy- last dose yesterday PM   Anesthesia Other Findings   Reproductive/Obstetrics negative OB ROS                             Anesthesia Physical  Anesthesia Plan  ASA: III  Anesthesia Plan: General   Post-op Pain Management:    Induction: Intravenous  PONV Risk Score and Plan: 3 and Treatment may vary due to age or medical condition and TIVA  Airway Management Planned: Mask and Natural Airway  Additional Equipment: None  Intra-op Plan:   Post-operative Plan:   Informed Consent: I have reviewed the patients History and Physical, chart, labs and discussed the procedure including the risks, benefits and alternatives for the proposed anesthesia with the patient or authorized representative who has indicated his/her understanding and acceptance.     Dental advisory given  Plan Discussed with: CRNA  Anesthesia Plan Comments:         Anesthesia Quick Evaluation

## 2020-12-01 ENCOUNTER — Encounter (HOSPITAL_COMMUNITY): Payer: Self-pay | Admitting: Cardiovascular Disease

## 2020-12-04 ENCOUNTER — Ambulatory Visit (HOSPITAL_COMMUNITY): Payer: Medicare Other | Admitting: Physician Assistant

## 2020-12-11 ENCOUNTER — Other Ambulatory Visit: Payer: Self-pay

## 2020-12-11 ENCOUNTER — Encounter (HOSPITAL_COMMUNITY): Payer: Self-pay | Admitting: Physician Assistant

## 2020-12-11 ENCOUNTER — Ambulatory Visit (HOSPITAL_COMMUNITY)
Admission: RE | Admit: 2020-12-11 | Discharge: 2020-12-11 | Disposition: A | Discharge status: Home / Self Care | Payer: Medicare Other | Source: Ambulatory Visit | Attending: Physician Assistant | Admitting: Physician Assistant

## 2020-12-11 VITALS — BP 142/80 | HR 51 | Ht 66.0 in | Wt 233.2 lb

## 2020-12-11 DIAGNOSIS — I4819 Other persistent atrial fibrillation: Secondary | ICD-10-CM | POA: Insufficient documentation

## 2020-12-11 DIAGNOSIS — I5042 Chronic combined systolic (congestive) and diastolic (congestive) heart failure: Secondary | ICD-10-CM | POA: Insufficient documentation

## 2020-12-11 DIAGNOSIS — Z888 Allergy status to other drugs, medicaments and biological substances status: Secondary | ICD-10-CM | POA: Insufficient documentation

## 2020-12-11 DIAGNOSIS — Z880 Allergy status to penicillin: Secondary | ICD-10-CM | POA: Diagnosis not present

## 2020-12-11 DIAGNOSIS — Z95810 Presence of automatic (implantable) cardiac defibrillator: Secondary | ICD-10-CM | POA: Diagnosis not present

## 2020-12-11 DIAGNOSIS — Z79899 Other long term (current) drug therapy: Secondary | ICD-10-CM | POA: Insufficient documentation

## 2020-12-11 DIAGNOSIS — I4892 Unspecified atrial flutter: Secondary | ICD-10-CM | POA: Diagnosis not present

## 2020-12-11 DIAGNOSIS — D6869 Other thrombophilia: Secondary | ICD-10-CM | POA: Diagnosis not present

## 2020-12-11 DIAGNOSIS — Z87891 Personal history of nicotine dependence: Secondary | ICD-10-CM | POA: Diagnosis not present

## 2020-12-11 DIAGNOSIS — I484 Atypical atrial flutter: Secondary | ICD-10-CM

## 2020-12-11 DIAGNOSIS — Z7901 Long term (current) use of anticoagulants: Secondary | ICD-10-CM | POA: Diagnosis not present
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
## 2020-12-15 ENCOUNTER — Other Ambulatory Visit: Payer: Self-pay

## 2020-12-15 ENCOUNTER — Ambulatory Visit
Admission: RE | Admit: 2020-12-15 | Discharge: 2020-12-15 | Disposition: A | Discharge status: Home / Self Care | Payer: Medicare Other | Source: Ambulatory Visit | Attending: Family Medicine | Admitting: Family Medicine

## 2020-12-15 DIAGNOSIS — Z1231 Encounter for screening mammogram for malignant neoplasm of breast: Secondary | ICD-10-CM | POA: Diagnosis not present

## 2020-12-17 DIAGNOSIS — M170 Bilateral primary osteoarthritis of knee: Secondary | ICD-10-CM | POA: Diagnosis not present

## 2020-12-17 DIAGNOSIS — G894 Chronic pain syndrome: Secondary | ICD-10-CM | POA: Diagnosis not present

## 2020-12-17 DIAGNOSIS — M47812 Spondylosis without myelopathy or radiculopathy, cervical region: Secondary | ICD-10-CM | POA: Diagnosis not present

## 2020-12-17 DIAGNOSIS — M47817 Spondylosis without myelopathy or radiculopathy, lumbosacral region: Secondary | ICD-10-CM | POA: Diagnosis not present

## 2020-12-18 DIAGNOSIS — I110 Hypertensive heart disease with heart failure: Secondary | ICD-10-CM | POA: Diagnosis not present

## 2020-12-18 DIAGNOSIS — I509 Heart failure, unspecified: Secondary | ICD-10-CM | POA: Diagnosis not present

## 2020-12-18 DIAGNOSIS — M301 Polyarteritis with lung involvement [Churg-Strauss]: Secondary | ICD-10-CM | POA: Diagnosis not present

## 2020-12-18 DIAGNOSIS — Z79899 Other long term (current) drug therapy: Secondary | ICD-10-CM | POA: Diagnosis not present

## 2020-12-18 DIAGNOSIS — D7218 Eosinophilia in diseases classified elsewhere: Secondary | ICD-10-CM | POA: Diagnosis not present

## 2020-12-18 DIAGNOSIS — Z7952 Long term (current) use of systemic steroids: Secondary | ICD-10-CM | POA: Diagnosis not present

## 2020-12-31 ENCOUNTER — Other Ambulatory Visit: Payer: Self-pay | Admitting: Family Medicine

## 2021-01-01 DIAGNOSIS — R0609 Other forms of dyspnea: Secondary | ICD-10-CM | POA: Diagnosis not present

## 2021-01-01 DIAGNOSIS — D7218 Eosinophilia in diseases classified elsewhere: Secondary | ICD-10-CM | POA: Diagnosis not present

## 2021-01-01 DIAGNOSIS — M301 Polyarteritis with lung involvement [Churg-Strauss]: Secondary | ICD-10-CM | POA: Diagnosis not present

## 2021-01-01 DIAGNOSIS — Z87891 Personal history of nicotine dependence: Secondary | ICD-10-CM | POA: Diagnosis not present

## 2021-01-02 ENCOUNTER — Telehealth: Payer: Self-pay

## 2021-01-02 MED ORDER — SUCRALFATE 1 G PO TABS: 1.0000 g | ORAL_TABLET | Freq: Three times a day (TID) | ORAL | 3 refills | Status: DC

## 2021-01-02 NOTE — Telephone Encounter (Signed)
OK new rx sent for carafate tabs

## 2021-01-02 NOTE — Telephone Encounter (Signed)
Edison Nasuti at [facility] called about a prescription for Carafate. Not on pt's current med list. (Last prescribed in 2016)

## 2021-01-06 ENCOUNTER — Other Ambulatory Visit: Payer: Self-pay

## 2021-01-06 ENCOUNTER — Encounter: Payer: Self-pay | Admitting: Cardiology

## 2021-01-06 ENCOUNTER — Ambulatory Visit (INDEPENDENT_AMBULATORY_CARE_PROVIDER_SITE_OTHER): Payer: Medicare Other | Admitting: Cardiology

## 2021-01-06 VITALS — BP 126/72 | HR 106 | Ht 66.0 in | Wt 229.8 lb

## 2021-01-06 DIAGNOSIS — Z01812 Encounter for preprocedural laboratory examination: Secondary | ICD-10-CM

## 2021-01-06 DIAGNOSIS — I4819 Other persistent atrial fibrillation: Secondary | ICD-10-CM | POA: Diagnosis not present

## 2021-01-06 MED ORDER — SUCRALFATE 1 GM/10ML PO SUSP: 1.0000 g | Freq: Three times a day (TID) | ORAL | 0 refills | Status: DC

## 2021-01-06 NOTE — Telephone Encounter (Signed)
Change to liquid.  New prescription sent to pharmacy.

## 2021-01-06 NOTE — Telephone Encounter (Signed)
Nicole Norton called and left a message stating she is wanting the liquid not the tablets.

## 2021-01-06 NOTE — Patient Instructions (Signed)
Medication Instructions:  Your physician recommends that you continue on your current medications as directed. Please refer to the Current Medication list given to you today.  *If you need a refill on your cardiac medications before your next appointment, please call your pharmacy*   Lab Work: None ordered If you have labs (blood work) drawn today and your tests are completely normal, you will receive your results only by: [name] MyChart Message (if you have MyChart) OR . A paper copy in the mail If you have any lab test that is abnormal or we need to change your treatment, we will call you to review the results.   Testing/Procedures: None ordered   Follow-Up: At [facility], you and your health needs are our priority.  As part of our continuing mission to provide you with exceptional heart care, we have created designated Provider Care Teams.  These Care Teams include your primary Cardiologist (physician) and Advanced Practice Providers (APPs -  Physician Assistants and Nurse Practitioners) who all work together to provide you with the care you need, when you need it.  We recommend signing up for the patient portal called "MyChart".  Sign up information is provided on this After Visit Summary.  MyChart is used to connect with patients for Virtual Visits (Telemedicine).  Patients are able to view lab/test results, encounter notes, upcoming appointments, etc.  Non-urgent messages can be sent to your provider as well.   To learn more about what you can do with MyChart, go to [email or web address].    Nicole Norton will send your ablation instructions via mychart.   Thank you for choosing [facility]!!   Trinidad Curet, RN [phone]   Other Instructions
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
## 2021-01-08 DIAGNOSIS — R0683 Snoring: Secondary | ICD-10-CM | POA: Diagnosis not present

## 2021-01-08 DIAGNOSIS — G4730 Sleep apnea, unspecified: Secondary | ICD-10-CM | POA: Diagnosis not present

## 2021-01-13 DIAGNOSIS — R0602 Shortness of breath: Secondary | ICD-10-CM | POA: Diagnosis not present

## 2021-01-14 DIAGNOSIS — G894 Chronic pain syndrome: Secondary | ICD-10-CM | POA: Diagnosis not present

## 2021-01-14 DIAGNOSIS — M47817 Spondylosis without myelopathy or radiculopathy, lumbosacral region: Secondary | ICD-10-CM | POA: Diagnosis not present

## 2021-01-14 DIAGNOSIS — M47812 Spondylosis without myelopathy or radiculopathy, cervical region: Secondary | ICD-10-CM | POA: Diagnosis not present

## 2021-01-14 DIAGNOSIS — M170 Bilateral primary osteoarthritis of knee: Secondary | ICD-10-CM | POA: Diagnosis not present

## 2021-01-16 ENCOUNTER — Other Ambulatory Visit: Payer: Self-pay | Admitting: Family Medicine

## 2021-01-20 ENCOUNTER — Telehealth: Payer: Self-pay | Admitting: Cardiology

## 2021-01-20 NOTE — Telephone Encounter (Signed)
    Nicole Norton from Dr. Soledad Gerlach, she said Dr. Laurance Flatten just saw the faxed notes from Dr. Curt Bears and she said the pt is ok to under go procedure.

## 2021-01-26 NOTE — Addendum Note (Signed)
Addended by: Stanton Kidney on: 01/26/2021 01:29 PM   Modules accepted: Orders

## 2021-01-29 ENCOUNTER — Other Ambulatory Visit: Payer: Self-pay | Admitting: *Deleted

## 2021-01-29 MED ORDER — SUCRALFATE 1 G PO TABS: 1.0000 g | ORAL_TABLET | Freq: Three times a day (TID) | ORAL | 1 refills | Status: DC

## 2021-01-29 MED ORDER — LEVOTHYROXINE SODIUM 200 MCG PO TABS: 200.0000 ug | ORAL_TABLET | Freq: Every day | ORAL | 1 refills | Status: DC

## 2021-01-29 MED ORDER — BUMETANIDE 2 MG PO TABS: 2.0000 mg | ORAL_TABLET | Freq: Every day | ORAL | 1 refills | Status: DC

## 2021-02-02 DIAGNOSIS — Z961 Presence of intraocular lens: Secondary | ICD-10-CM | POA: Diagnosis not present

## 2021-02-02 DIAGNOSIS — H26493 Other secondary cataract, bilateral: Secondary | ICD-10-CM | POA: Diagnosis not present

## 2021-02-02 DIAGNOSIS — H02831 Dermatochalasis of right upper eyelid: Secondary | ICD-10-CM | POA: Diagnosis not present

## 2021-02-02 DIAGNOSIS — H527 Unspecified disorder of refraction: Secondary | ICD-10-CM | POA: Diagnosis not present

## 2021-02-02 DIAGNOSIS — H02834 Dermatochalasis of left upper eyelid: Secondary | ICD-10-CM | POA: Diagnosis not present

## 2021-02-02 DIAGNOSIS — H18593 Other hereditary corneal dystrophies, bilateral: Secondary | ICD-10-CM | POA: Diagnosis not present

## 2021-02-02 DIAGNOSIS — H26492 Other secondary cataract, left eye: Secondary | ICD-10-CM | POA: Diagnosis not present

## 2021-02-02 DIAGNOSIS — H52223 Regular astigmatism, bilateral: Secondary | ICD-10-CM | POA: Diagnosis not present

## 2021-02-06 ENCOUNTER — Ambulatory Visit (INDEPENDENT_AMBULATORY_CARE_PROVIDER_SITE_OTHER): Payer: Medicare Other | Admitting: Family Medicine

## 2021-02-06 DIAGNOSIS — Z0000 Encounter for general adult medical examination without abnormal findings: Secondary | ICD-10-CM | POA: Diagnosis not present

## 2021-02-06 DIAGNOSIS — Z13820 Encounter for screening for osteoporosis: Secondary | ICD-10-CM

## 2021-02-06 DIAGNOSIS — Z780 Asymptomatic menopausal state: Secondary | ICD-10-CM
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
## 2021-02-06 NOTE — Patient Instructions (Addendum)
[place] Maintenance Summary and Written Plan of Care  Ms. Nicole Norton ,  Thank you for allowing me to perform your Medicare Annual Wellness Visit and for your ongoing commitment to your health.   Health Maintenance & Immunization History Health Maintenance  Topic Date Due  . Hepatitis C Screening  02/06/2022 (Originally 1941/09/29)  . TETANUS/TDAP  09/18/2024  . INFLUENZA VACCINE  Completed  . DEXA SCAN  Completed  . COVID-19 Vaccine  Completed  . PNA vac Low Risk Adult  Completed  . HPV VACCINES  Aged Out   Immunization History  Administered Date(s) Administered  . Influenza Split 09/14/2012  . Influenza Whole 08/06/2010  . Influenza, High Dose Seasonal PF 09/09/2017, 08/31/2018, 09/06/2020  . Influenza,inj,Quad PF,6+ Mos 09/11/2015  . Influenza-Unspecified 09/14/2013, 09/11/2015, 09/16/2016  . PFIZER(Purple Top)SARS-COV-2 Vaccination 02/25/2020, 03/17/2020, 09/17/2020  . Pneumococcal Conjugate-13 03/18/2014  . Pneumococcal Polysaccharide-23 10/28/2015  . Tdap 09/18/2014    These are the patient goals that we discussed: Goals Addressed              This Visit's Progress   .  Patient Stated (pt-stated)        02/06/2021 AWV Goal: Exercise for General Health   Patient will verbalize understanding of the benefits of increased physical activity:  Exercising regularly is important. It will improve your overall fitness, flexibility, and endurance.  Regular exercise also will improve your overall health. It can help you control your weight, reduce stress, and improve your bone density.  Over the next year, patient will increase physical activity as tolerated with a goal of at least 150 minutes of moderate physical activity per week.   You can tell that you are exercising at a moderate intensity if your heart starts beating faster and you start breathing faster but can still hold a conversation.  Moderate-intensity exercise ideas  include:  Walking 1 mile (1.6 km) in about 15 minutes  Biking  Hiking  Golfing  Dancing  Water aerobics  Patient will verbalize understanding of everyday activities that increase physical activity by providing examples like the following: ? Yard work, such as: ? Pushing a Conservation officer, nature ? Raking and bagging leaves ? Washing your car ? Pushing a stroller ? Shoveling snow ? Gardening ? Washing windows or floors  Patient will be able to explain general safety guidelines for exercising:   Before you start a new exercise program, talk with your health care provider.  Do not exercise so much that you hurt yourself, feel dizzy, or get very short of breath.  Wear comfortable clothes and wear shoes with good support.  Drink plenty of water while you exercise to prevent dehydration or heat stroke.  Work out until your breathing and your heartbeat get faster.         This is a list of Health Maintenance Items that are overdue or due now: Bone densitometry screening Shingrix  Orders/Referrals Placed Today: Orders Placed This Encounter  Procedures  . DEXAScan    Standing Status:   Future    Standing Expiration Date:   02/06/2022    Scheduling Instructions:     Please call patient to schedule the appointment for Dexa in June, 2022.    Order Specific Question:   Reason for exam:    Answer:   Post Menopausal/ osteoporosis screen    Order Specific Question:   Preferred imaging location?    Answer:   [facility]   (Contact our referral department at [phone] if  you have not spoken with someone about your referral appointment within the next 5 days)    Follow-up Plan . Follow-up with Hali Marry, MD as planned . Dexa scan referral has been sent and they will give you a call to schedule.  . Please schedule your shingrix vaccine appointment at your pharmacy.  . Medicare wellness in one year.    Bone Density Test A bone density test uses a type of  X-ray to measure the amount of calcium and other minerals in a person's bones. It can measure bone density in the hip and the spine. The test is similar to having a regular X-ray. This test may also be called:  Bone densitometry.  Bone mineral density test.  Dual-energy X-ray absorptiometry (DEXA). You may have this test to:  Diagnose a condition that causes weak or thin bones (osteoporosis).  Screen you for osteoporosis.  Predict your risk for a broken bone (fracture).  Determine how well your osteoporosis treatment is working. Tell a health care provider about:  Any allergies you have.  All medicines you are taking, including vitamins, herbs, eye drops, creams, and over-the-counter medicines.  Any problems you or family members have had with anesthetic medicines.  Any blood disorders you have.  Any surgeries you have had.  Any medical conditions you have.  Whether you are pregnant or may be pregnant.  Any medical tests you have had within the past 14 days that used contrast material. What are the risks? Generally, this is a safe test. However, it does expose you to a small amount of radiation, which can slightly increase your cancer risk. What happens before the test?  Do not take any calcium supplements within the 24 hours before your test.  You will need to remove all metal jewelry, eyeglasses, removable dental appliances, and any other metal objects on your body. What happens during the test?  You will lie down on an exam table. There will be an X-ray generator below you and an imaging device above you.  Other devices, such as boxes or braces, may be used to position your body properly for the scan.  The machine will slowly scan your body. You will need to keep very still while the machine does the scan.  The images will show up on a screen in the room. Images will be examined by a specialist after your test is finished. The procedure may vary among health care  providers and hospitals.   What can I expect after the test? It is up to you to get the results of your test. Ask your health care provider, or the department that is doing the test, when your results will be ready. Summary  A bone density test is an imaging test that uses a type of X-ray to measure the amount of calcium and other minerals in your bones.  The test may be used to diagnose or screen you for a condition that causes weak or thin bones (osteoporosis), predict your risk for a broken bone (fracture), or determine how well your osteoporosis treatment is working.  Do not take any calcium supplements within 24 hours before your test.  Ask your health care provider, or the department that is doing the test, when your results will be ready. This information is not intended to replace advice given to you by your health care provider. Make sure you discuss any questions you have with your health care provider. Document Revised: 04/17/2020 Document Reviewed: 04/17/2020 Elsevier Patient Education  [place] Maintenance, Female Adopting a healthy lifestyle and getting preventive care are important in promoting health and wellness. Ask your health care provider about:  The right schedule for you to have regular tests and exams.  Things you can do on your own to prevent diseases and keep yourself healthy. What should I know about diet, weight, and exercise? Eat a healthy diet  Eat a diet that includes plenty of vegetables, fruits, low-fat dairy products, and lean protein.  Do not eat a lot of foods that are high in solid fats, added sugars, or sodium.   Maintain a healthy weight Body mass index (BMI) is used to identify weight problems. It estimates body fat based on height and weight. Your health care provider can help determine your BMI and help you achieve or maintain a healthy weight. Get regular exercise Get regular exercise. This is one of the most important  things you can do for your health. Most adults should:  Exercise for at least 150 minutes each week. The exercise should increase your heart rate and make you sweat (moderate-intensity exercise).  Do strengthening exercises at least twice a week. This is in addition to the moderate-intensity exercise.  Spend less time sitting. Even light physical activity can be beneficial. Watch cholesterol and blood lipids Have your blood tested for lipids and cholesterol at 78 years of age, then have this test every 5 years. Have your cholesterol levels checked more often if:  Your lipid or cholesterol levels are high.  You are older than 78 years of age.  You are at high risk for heart disease. What should I know about cancer screening? Depending on your health history and family history, you may need to have cancer screening at various ages. This may include screening for:  Breast cancer.  Cervical cancer.  Colorectal cancer.  Skin cancer.  Lung cancer. What should I know about heart disease, diabetes, and high blood pressure? Blood pressure and heart disease  High blood pressure causes heart disease and increases the risk of stroke. This is more likely to develop in people who have high blood pressure readings, are of African descent, or are overweight.  Have your blood pressure checked: ? Every 3-5 years if you are 28-60 years of age. ? Every year if you are 16 years old or older. Diabetes Have regular diabetes screenings. This checks your fasting blood sugar level. Have the screening done:  Once every three years after age 65 if you are at a normal weight and have a low risk for diabetes.  More often and at a younger age if you are overweight or have a high risk for diabetes. What should I know about preventing infection? Hepatitis B If you have a higher risk for hepatitis B, you should be screened for this virus. Talk with your health care provider to find out if you are at risk  for hepatitis B infection. Hepatitis C Testing is recommended for:  Everyone born from [birth year] through 1963.  Anyone with known risk factors for hepatitis C. Sexually transmitted infections (STIs)  Get screened for STIs, including gonorrhea and chlamydia, if: ? You are sexually active and are younger than 78 years of age. ? You are older than 78 years of age and your health care provider tells you that you are at risk for this type of infection. ? Your sexual activity has changed since you were last screened, and you are at increased risk for chlamydia or  gonorrhea. Ask your health care provider if you are at risk.  Ask your health care provider about whether you are at high risk for HIV. Your health care provider may recommend a prescription medicine to help prevent HIV infection. If you choose to take medicine to prevent HIV, you should first get tested for HIV. You should then be tested every 3 months for as long as you are taking the medicine. Pregnancy  If you are about to stop having your period (premenopausal) and you may become pregnant, seek counseling before you get pregnant.  Take 400 to 800 micrograms (mcg) of folic acid every day if you become pregnant.  Ask for birth control (contraception) if you want to prevent pregnancy. Osteoporosis and menopause Osteoporosis is a disease in which the bones lose minerals and strength with aging. This can result in bone fractures. If you are 63 years old or older, or if you are at risk for osteoporosis and fractures, ask your health care provider if you should:  Be screened for bone loss.  Take a calcium or vitamin D supplement to lower your risk of fractures.  Be given hormone replacement therapy (HRT) to treat symptoms of menopause. Follow these instructions at home: Lifestyle  Do not use any products that contain nicotine or tobacco, such as cigarettes, e-cigarettes, and chewing tobacco. If you need help quitting, ask your health  care provider.  Do not use street drugs.  Do not share needles.  Ask your health care provider for help if you need support or information about quitting drugs. Alcohol use  Do not drink alcohol if: ? Your health care provider tells you not to drink. ? You are pregnant, may be pregnant, or are planning to become pregnant.  If you drink alcohol: ? Limit how much you use to 0-1 drink a day. ? Limit intake if you are breastfeeding.  Be aware of how much alcohol is in your drink. In the [place] one drink equals one 12 oz bottle of beer (355 mL), one 5 oz glass of wine (148 mL), or one 1 oz glass of hard liquor (44 mL). General instructions  Schedule regular health, dental, and eye exams.  Stay current with your vaccines.  Tell your health care provider if: ? You often feel depressed. ? You have ever been abused or do not feel safe at home. Summary  Adopting a healthy lifestyle and getting preventive care are important in promoting health and wellness.  Follow your health care provider's instructions about healthy diet, exercising, and getting tested or screened for diseases.  Follow your health care provider's instructions on monitoring your cholesterol and blood pressure. This information is not intended to replace advice given to you by your health care provider. Make sure you discuss any questions you have with your health care provider. Document Revised: 10/25/2018 Document Reviewed: 10/25/2018 Elsevier Patient Education  2021 [employer].

## 2021-02-09 ENCOUNTER — Other Ambulatory Visit: Payer: Medicare Other | Admitting: *Deleted

## 2021-02-09 ENCOUNTER — Other Ambulatory Visit: Payer: Self-pay

## 2021-02-09 ENCOUNTER — Telehealth: Payer: Self-pay | Admitting: Cardiology

## 2021-02-09 DIAGNOSIS — I4819 Other persistent atrial fibrillation: Secondary | ICD-10-CM | POA: Diagnosis not present

## 2021-02-09 DIAGNOSIS — Z01812 Encounter for preprocedural laboratory examination: Secondary | ICD-10-CM

## 2021-02-09 NOTE — Telephone Encounter (Signed)
Left message to call back  

## 2021-02-09 NOTE — Telephone Encounter (Signed)
New message:     Patient would like to speak with the nurse concerning her up coming surgery. Please call patient.

## 2021-02-10 LAB — BASIC METABOLIC PANEL
BUN/Creatinine Ratio: 16 (ref 12–28)
BUN: 24 mg/dL (ref 8–27)
CO2: 23 mmol/L (ref 20–29)
Calcium: 9.2 mg/dL (ref 8.7–10.3)
Chloride: 105 mmol/L (ref 96–106)
Creatinine, Ser: 1.5 mg/dL — ABNORMAL HIGH (ref 0.57–1.00)
Glucose: 109 mg/dL — ABNORMAL HIGH (ref 65–99)
Potassium: 3.8 mmol/L (ref 3.5–5.2)
Sodium: 145 mmol/L — ABNORMAL HIGH (ref 134–144)
eGFR: 36 mL/min/{1.73_m2} — ABNORMAL LOW (ref >59)

## 2021-02-10 LAB — CBC
Hematocrit: 43.8 % (ref 34.0–46.6)
Hemoglobin: 14.8 g/dL (ref 11.1–15.9)
MCH: 33.2 pg — ABNORMAL HIGH (ref 26.6–33.0)
MCHC: 33.8 g/dL (ref 31.5–35.7)
MCV: 98 fL — ABNORMAL HIGH (ref 79–97)
Platelets: 234 10*3/uL (ref 150–450)
RBC: 4.46 x10E6/uL (ref 3.77–5.28)
RDW: 13 % (ref 11.7–15.4)
WBC: 13.9 10*3/uL — ABNORMAL HIGH (ref 3.4–10.8)

## 2021-02-10 NOTE — Telephone Encounter (Signed)
Pt calls in reporting BPs & HRs have been elevated.  She reports that she took a second Metoprolol yesterday. Today BP is much improved, 120/75. Informed pt that according to our medication list she should be taking the Metoprolol BID.  Advised to start/return to BID dosing, Dr. Curt Bears aware and agreeable. Patient verbalized understanding and agreeable to plan.

## 2021-02-11 ENCOUNTER — Telehealth (HOSPITAL_COMMUNITY): Payer: Self-pay | Admitting: Emergency Medicine

## 2021-02-11 NOTE — Telephone Encounter (Signed)
Pt returning phone call regarding upcoming cardiac imaging study; pt verbalizes understanding of appt date/time, parking situation and where to check in, pre-test NPO status and medications ordered, and verified current allergies; name and call back number provided for further questions should they arise Nicole Bond RN Navigator Cardiac Imaging [facility] [phone] office [phone] cell   Pt aware of additional appt made tomorrow in the infusion clinic at 12:00 noon for IV hydration.  Holding bumex, zyrtec Nicole Norton

## 2021-02-11 NOTE — Telephone Encounter (Signed)
Attempted to call patient regarding upcoming cardiac CT appointment. Left message on voicemail with name and callback number [name] RN Navigator Cardiac Imaging [facility] [phone] Office [phone] Cell   Left detailed message about adding on infusion clinic appt tomorrow at 12:00 noon for IV hydration related to renal insufficiency  I asked her to call me back to confirm she received the message and if she has any questions  Clarise Cruz

## 2021-02-12 ENCOUNTER — Ambulatory Visit (HOSPITAL_COMMUNITY)
Admission: RE | Admit: 2021-02-12 | Discharge: 2021-02-12 | Disposition: A | Discharge status: Home / Self Care | Payer: Medicare Other | Source: Ambulatory Visit | Attending: Internal Medicine | Admitting: Internal Medicine

## 2021-02-12 ENCOUNTER — Other Ambulatory Visit: Payer: Self-pay

## 2021-02-12 ENCOUNTER — Ambulatory Visit (HOSPITAL_COMMUNITY)
Admission: RE | Admit: 2021-02-12 | Discharge: 2021-02-12 | Disposition: A | Discharge status: Home / Self Care | Payer: Medicare Other | Source: Ambulatory Visit | Attending: Cardiology | Admitting: Cardiology

## 2021-02-12 DIAGNOSIS — G894 Chronic pain syndrome: Secondary | ICD-10-CM | POA: Diagnosis not present

## 2021-02-12 DIAGNOSIS — M170 Bilateral primary osteoarthritis of knee: Secondary | ICD-10-CM | POA: Diagnosis not present

## 2021-02-12 DIAGNOSIS — M47812 Spondylosis without myelopathy or radiculopathy, cervical region: Secondary | ICD-10-CM | POA: Diagnosis not present

## 2021-02-12 DIAGNOSIS — I4819 Other persistent atrial fibrillation: Secondary | ICD-10-CM | POA: Insufficient documentation

## 2021-02-12 DIAGNOSIS — M47817 Spondylosis without myelopathy or radiculopathy, lumbosacral region: Secondary | ICD-10-CM | POA: Diagnosis not present

## 2021-02-12 LAB — BASIC METABOLIC PANEL
Anion gap: 9 (ref 5–15)
BUN: 25 mg/dL — ABNORMAL HIGH (ref 8–23)
CO2: 27 mmol/L (ref 22–32)
Calcium: 8.9 mg/dL (ref 8.9–10.3)
Chloride: 103 mmol/L (ref 98–111)
Creatinine, Ser: 1.64 mg/dL — ABNORMAL HIGH (ref 0.44–1.00)
GFR, Estimated: 32 mL/min — ABNORMAL LOW (ref >60)
Glucose, Bld: 90 mg/dL (ref 70–99)
Potassium: 3.8 mmol/L (ref 3.5–5.1)
Sodium: 139 mmol/L (ref 135–145)

## 2021-02-12 MED ORDER — IOHEXOL 350 MG/ML SOLN
80.0000 mL | Freq: Once | INTRAVENOUS | Status: AC | PRN
  Administered 2021-02-12: 80 mL via INTRAVENOUS

## 2021-02-12 MED ORDER — SODIUM CHLORIDE 0.9 % WEIGHT BASED INFUSION
3.0000 mL/kg/h | INTRAVENOUS | Status: AC
  Administered 2021-02-12: 3 mL/kg/h via INTRAVENOUS

## 2021-02-12 MED ORDER — SODIUM CHLORIDE 0.9 % WEIGHT BASED INFUSION: 1.0000 mL/kg/h | INTRAVENOUS | Status: DC

## 2021-02-16 ENCOUNTER — Other Ambulatory Visit (HOSPITAL_COMMUNITY)
Admission: RE | Admit: 2021-02-16 | Discharge: 2021-02-16 | Disposition: A | Discharge status: Home / Self Care | Payer: Medicare Other | Source: Ambulatory Visit | Attending: Cardiology | Admitting: Cardiology

## 2021-02-16 DIAGNOSIS — Z20822 Contact with and (suspected) exposure to covid-19: Secondary | ICD-10-CM | POA: Diagnosis not present

## 2021-02-16 DIAGNOSIS — Z01812 Encounter for preprocedural laboratory examination: Secondary | ICD-10-CM | POA: Diagnosis not present

## 2021-02-16 LAB — SARS CORONAVIRUS 2 (TAT 6-24 HRS): SARS Coronavirus 2: NEGATIVE

## 2021-02-18 ENCOUNTER — Ambulatory Visit (HOSPITAL_COMMUNITY): Payer: Medicare Other | Admitting: Certified Registered Nurse Anesthetist

## 2021-02-18 ENCOUNTER — Encounter (HOSPITAL_COMMUNITY): Payer: Self-pay | Admitting: Cardiology

## 2021-02-18 ENCOUNTER — Other Ambulatory Visit: Payer: Self-pay

## 2021-02-18 ENCOUNTER — Ambulatory Visit (HOSPITAL_COMMUNITY)
Admission: RE | Admit: 2021-02-18 | Discharge: 2021-02-18 | Disposition: A | Discharge status: Home / Self Care | Payer: Medicare Other | Attending: Cardiology | Admitting: Cardiology

## 2021-02-18 ENCOUNTER — Encounter (HOSPITAL_COMMUNITY)
Admission: RE | Disposition: A | Discharge status: Home / Self Care | Payer: Self-pay | Source: Home / Self Care | Attending: Cardiology

## 2021-02-18 DIAGNOSIS — Z8249 Family history of ischemic heart disease and other diseases of the circulatory system: Secondary | ICD-10-CM | POA: Insufficient documentation

## 2021-02-18 DIAGNOSIS — Z880 Allergy status to penicillin: Secondary | ICD-10-CM | POA: Diagnosis not present

## 2021-02-18 DIAGNOSIS — I4892 Unspecified atrial flutter: Secondary | ICD-10-CM | POA: Diagnosis not present

## 2021-02-18 DIAGNOSIS — I10 Essential (primary) hypertension: Secondary | ICD-10-CM | POA: Insufficient documentation

## 2021-02-18 DIAGNOSIS — Z90721 Acquired absence of ovaries, unilateral: Secondary | ICD-10-CM | POA: Diagnosis not present

## 2021-02-18 DIAGNOSIS — I4819 Other persistent atrial fibrillation: Secondary | ICD-10-CM | POA: Diagnosis not present

## 2021-02-18 DIAGNOSIS — I4891 Unspecified atrial fibrillation: Secondary | ICD-10-CM | POA: Diagnosis not present

## 2021-02-18 DIAGNOSIS — Z885 Allergy status to narcotic agent status: Secondary | ICD-10-CM | POA: Insufficient documentation

## 2021-02-18 DIAGNOSIS — Z6837 Body mass index (BMI) 37.0-37.9, adult: Secondary | ICD-10-CM | POA: Insufficient documentation

## 2021-02-18 DIAGNOSIS — E785 Hyperlipidemia, unspecified: Secondary | ICD-10-CM | POA: Insufficient documentation

## 2021-02-18 DIAGNOSIS — I484 Atypical atrial flutter: Secondary | ICD-10-CM | POA: Diagnosis not present

## 2021-02-18 DIAGNOSIS — E039 Hypothyroidism, unspecified: Secondary | ICD-10-CM | POA: Diagnosis not present

## 2021-02-18 DIAGNOSIS — Z888 Allergy status to other drugs, medicaments and biological substances status: Secondary | ICD-10-CM | POA: Insufficient documentation

## 2021-02-18 DIAGNOSIS — Z87891 Personal history of nicotine dependence: Secondary | ICD-10-CM | POA: Diagnosis not present

## 2021-02-18 DIAGNOSIS — D509 Iron deficiency anemia, unspecified: Secondary | ICD-10-CM | POA: Diagnosis not present

## 2021-02-18 DIAGNOSIS — Z90710 Acquired absence of both cervix and uterus: Secondary | ICD-10-CM | POA: Insufficient documentation

## 2021-02-18 DIAGNOSIS — E669 Obesity, unspecified: Secondary | ICD-10-CM | POA: Insufficient documentation

## 2021-02-18 DIAGNOSIS — E7800 Pure hypercholesterolemia, unspecified: Secondary | ICD-10-CM | POA: Diagnosis not present

## 2021-02-18 HISTORY — PX: ATRIAL FIBRILLATION ABLATION: EP1191

## 2021-02-18 LAB — POCT ACTIVATED CLOTTING TIME
Activated Clotting Time: 368 seconds
Activated Clotting Time: 327 seconds
Activated Clotting Time: 344 seconds

## 2021-02-18 SURGERY — ATRIAL FIBRILLATION ABLATION
Anesthesia: General

## 2021-02-18 MED ORDER — SODIUM CHLORIDE 0.9 % IV SOLN: INTRAVENOUS | Status: DC | PRN

## 2021-02-18 MED ORDER — FENTANYL CITRATE (PF) 250 MCG/5ML IJ SOLN
INTRAMUSCULAR | Status: DC | PRN
  Administered 2021-02-18: 100 ug via INTRAVENOUS

## 2021-02-18 MED ORDER — SUGAMMADEX SODIUM 200 MG/2ML IV SOLN
INTRAVENOUS | Status: DC | PRN
  Administered 2021-02-18: 200 mg via INTRAVENOUS

## 2021-02-18 MED ORDER — ONDANSETRON HCL 4 MG/2ML IJ SOLN: 4.0000 mg | Freq: Four times a day (QID) | INTRAMUSCULAR | Status: DC | PRN

## 2021-02-18 MED ORDER — DOBUTAMINE IN D5W 4-5 MG/ML-% IV SOLN
INTRAVENOUS | Status: DC | PRN
  Administered 2021-02-18: 20 ug/kg/min via INTRAVENOUS

## 2021-02-18 MED ORDER — SODIUM CHLORIDE 0.9 % IV SOLN: 250.0000 mL | INTRAVENOUS | Status: DC | PRN

## 2021-02-18 MED ORDER — ONDANSETRON HCL 4 MG/2ML IJ SOLN
INTRAMUSCULAR | Status: DC | PRN
  Administered 2021-02-18: 4 mg via INTRAVENOUS

## 2021-02-18 MED ORDER — SODIUM CHLORIDE 0.9 % IV SOLN: INTRAVENOUS | Status: DC

## 2021-02-18 MED ORDER — PROTAMINE SULFATE 10 MG/ML IV SOLN
INTRAVENOUS | Status: DC | PRN
  Administered 2021-02-18: 10 mg via INTRAVENOUS
  Administered 2021-02-18: 30 mg via INTRAVENOUS

## 2021-02-18 MED ORDER — HEPARIN SODIUM (PORCINE) 1000 UNIT/ML IJ SOLN
INTRAMUSCULAR | Status: DC | PRN
  Administered 2021-02-18: 2000 [IU] via INTRAVENOUS
  Administered 2021-02-18: 15000 [IU] via INTRAVENOUS

## 2021-02-18 MED ORDER — HEPARIN (PORCINE) IN NACL 1000-0.9 UT/500ML-% IV SOLN
INTRAVENOUS | Status: AC
  Filled 2021-02-18: qty 2500

## 2021-02-18 MED ORDER — PHENYLEPHRINE HCL (PRESSORS) 10 MG/ML IV SOLN
INTRAVENOUS | Status: DC | PRN
  Administered 2021-02-18: 120 ug via INTRAVENOUS
  Administered 2021-02-18: 80 ug via INTRAVENOUS

## 2021-02-18 MED ORDER — HEPARIN (PORCINE) IN NACL 1000-0.9 UT/500ML-% IV SOLN
INTRAVENOUS | Status: DC | PRN
  Administered 2021-02-18 (×5): 500 mL

## 2021-02-18 MED ORDER — HEPARIN SODIUM (PORCINE) 1000 UNIT/ML IJ SOLN
INTRAMUSCULAR | Status: DC | PRN
  Administered 2021-02-18: 1000 [IU] via INTRAVENOUS

## 2021-02-18 MED ORDER — PROPOFOL 10 MG/ML IV BOLUS
INTRAVENOUS | Status: DC | PRN
  Administered 2021-02-18: 160 mg via INTRAVENOUS

## 2021-02-18 MED ORDER — HEPARIN SODIUM (PORCINE) 1000 UNIT/ML IJ SOLN
INTRAMUSCULAR | Status: AC
  Filled 2021-02-18: qty 1

## 2021-02-18 MED ORDER — ROCURONIUM BROMIDE 10 MG/ML (PF) SYRINGE
PREFILLED_SYRINGE | INTRAVENOUS | Status: DC | PRN
  Administered 2021-02-18: 60 mg via INTRAVENOUS

## 2021-02-18 MED ORDER — SODIUM CHLORIDE 0.9% FLUSH: 3.0000 mL | Freq: Two times a day (BID) | INTRAVENOUS | Status: DC

## 2021-02-18 MED ORDER — SODIUM CHLORIDE 0.9% FLUSH: 3.0000 mL | INTRAVENOUS | Status: DC | PRN

## 2021-02-18 MED ORDER — PHENYLEPHRINE HCL-NACL 10-0.9 MG/250ML-% IV SOLN
INTRAVENOUS | Status: DC | PRN
  Administered 2021-02-18: 25 ug/min via INTRAVENOUS

## 2021-02-18 MED ORDER — ACETAMINOPHEN 325 MG PO TABS
650.0000 mg | ORAL_TABLET | ORAL | Status: DC | PRN
  Filled 2021-02-18: qty 2

## 2021-02-18 SURGICAL SUPPLY — 22 items
BAG SNAP BAND KOVER 36X36 (MISCELLANEOUS) ×1 IMPLANT
BLANKET WARM UNDERBOD FULL ACC (MISCELLANEOUS) ×2 IMPLANT
CATH 8FR REPROCESSED SOUNDSTAR (CATHETERS) ×2 IMPLANT
CATH 8FR SOUNDSTAR REPROCESSED (CATHETERS) IMPLANT
CATH MAPPNG PENTARAY F 2-6-2MM (CATHETERS) IMPLANT
CATH S CIRCA THERM PROBE 10F (CATHETERS) ×1 IMPLANT
CATH SMTCH THERMOCOOL SF DF (CATHETERS) ×1 IMPLANT
CATH WEB BI DIR CSDF CRV REPRO (CATHETERS) ×1 IMPLANT
CLOSURE PERCLOSE PROSTYLE (VASCULAR PRODUCTS) ×4 IMPLANT
COVER SWIFTLINK CONNECTOR (BAG) ×2 IMPLANT
KIT VERSACROSS STEERABLE D1 (CATHETERS) ×1 IMPLANT
PACK EP LATEX FREE (CUSTOM PROCEDURE TRAY) ×2
PACK EP LF (CUSTOM PROCEDURE TRAY) ×1 IMPLANT
PAD PRO RADIOLUCENT 2001M-C (PAD) ×2 IMPLANT
PATCH CARTO3 (PAD) ×1 IMPLANT
PENTARAY F 2-6-2MM (CATHETERS) ×2
SHEATH CARTO VIZIGO SM CVD (SHEATH) ×1 IMPLANT
SHEATH PINNACLE 7F 10CM (SHEATH) ×1 IMPLANT
SHEATH PINNACLE 8F 10CM (SHEATH) ×2 IMPLANT
SHEATH PINNACLE 9F 10CM (SHEATH) ×1 IMPLANT
SHEATH PROBE COVER 6X72 (BAG) ×1 IMPLANT
TUBING SMART ABLATE COOLFLOW (TUBING) ×1 IMPLANT

## 2021-02-18 NOTE — Anesthesia Preprocedure Evaluation (Addendum)
Anesthesia Evaluation  Patient identified by MRN, date of birth, ID band Patient awake    Reviewed: Allergy & Precautions, NPO status , Patient's Chart, lab work & pertinent test results, reviewed documented beta blocker date and time   Airway Mallampati: III  TM Distance: >3 FB Neck ROM: Full    Dental no notable dental hx. 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 on medications + Past MI and +CHF  Normal cardiovascular exam+ dysrhythmias (amio, metoprolol, xarelto s/p 2 prior cardioversions) Atrial Fibrillation + Valvular Problems/Murmurs (mild MR, mild TR) MR  Rhythm:Irregular Rate:Normal  Non STEMI 05/21/2018   Neuro/Psych PSYCHIATRIC DISORDERS Anxiety Depression negative neurological ROS     GI/Hepatic Neg liver ROS, GERD  Medicated,  Endo/Other  Hypothyroidism Obesity Hyperlipidemia  Renal/GU Renal InsufficiencyRenal diseaseCr 1.53  negative genitourinary   Musculoskeletal  (+) Arthritis , Osteoarthritis,  Chronic back pain   Abdominal (+) + obese,   Peds  Hematology  (+) anemia , Eliquis therapy- last dose yesterday PM   Anesthesia Other Findings   Reproductive/Obstetrics negative OB ROS                            Anesthesia Physical  Anesthesia Plan  ASA: III  Anesthesia Plan: General   Post-op Pain Management:    Induction: Intravenous  PONV Risk Score and Plan: 3 and Treatment may vary due to age or medical condition and TIVA  Airway Management Planned: Mask and Natural Airway  Additional Equipment: None  Intra-op Plan:   Post-operative Plan:   Informed Consent: I have reviewed the patients History and Physical, chart, labs and discussed the  procedure including the risks, benefits and alternatives for the proposed anesthesia with the patient or authorized representative who has indicated his/her understanding and acceptance.     Dental advisory given  Plan Discussed with: CRNA and Anesthesiologist  Anesthesia Plan Comments:         Anesthesia Quick Evaluation

## 2021-02-18 NOTE — Anesthesia Procedure Notes (Signed)
Procedure Name: Intubation Date/Time: 02/18/2021 11:17 AM Performed by: Glynda Jaeger, CRNA Pre-anesthesia Checklist: Patient identified, Patient being monitored, Timeout performed, Emergency Drugs available and Suction available Patient Re-evaluated:Patient Re-evaluated prior to induction Oxygen Delivery Method: Circle System Utilized Preoxygenation: Pre-oxygenation with 100% oxygen Induction Type: IV induction Ventilation: Mask ventilation without difficulty Laryngoscope Size: 3 and Glidescope Grade View: Grade I Tube type: Oral Tube size: 7.5 mm Number of attempts: 1 Airway Equipment and Method: Video-laryngoscopy Placement Confirmation: ETT inserted through vocal cords under direct vision,  positive ETCO2 and breath sounds checked- equal and bilateral Secured at: 21 cm Tube secured with: Tape Dental Injury: Teeth and Oropharynx as per pre-operative assessment

## 2021-02-18 NOTE — Discharge Instructions (Signed)
Cardiac Ablation, Care After This sheet gives you information about how to care for yourself after your procedure. Your health care provider may also give you more specific instructions. If you have problems or questions, contact your health care provider. What can I expect after the procedure? After the procedure, it is common to have:  Bruising around the insertion site.  Tenderness around the insertion site.  Skipped heartbeats.  Tiredness (fatigue). Follow these instructions at home: Insertion site care  Follow instructions from your health care provider about how to take care of your insertion site. Make sure you: ? Wash your hands with soap and water for at least 20 seconds before and after you change your bandage (dressing). If soap and water are not available, use hand sanitizer. ? Change your dressing as told by your health care provider. ? Leave stitches (sutures), skin glue, or adhesive strips in place. These skin closures may need to stay in place for up to 2 weeks. If adhesive strip edges start to loosen and curl up, you may trim the loose edges. Do not remove adhesive strips completely unless your health care provider tells you to do that.  Check your insertion site every day for signs of infection. Check for: ? More redness, swelling, or pain. ? Fluid or blood. ? Warmth. ? Pus or a bad smell.  If your insertion site starts to bleed, lie down on your back, apply firm pressure to the area, and contact your health care provider.   Driving  If you were given a sedative during the procedure, it can affect you for several hours. Do not drive or operate machinery until your health care provider says that it is safe.  Ask your health care provider when it is safe for you to drive again after the procedure.   Activity  Avoid activities that take a lot of effort for at least 3 days after your procedure.  Do not lift anything that is heavier than 10 lb (4.5 kg), or the limit  that you are told, until your health care provider says that it is safe.  Return to your normal activities as told by your health care provider. Ask your health care provider what activities are safe for you.   General instructions  Take over-the-counter and prescription medicines only as told by your health care provider.  Do not use any products that contain nicotine or tobacco, such as cigarettes, e-cigarettes, and chewing tobacco. If you need help quitting, ask your health care provider.  Do not take baths, swim, or use a hot tub until your health care provider approves. Ask your health care provider if you may take showers. You may only be allowed to take sponge baths.  Do not drink alcohol for 24 hours after your procedure.  Keep all follow-up visits as told by your health care provider. This is important. Contact a health care provider if you:  Notice these things around the catheter insertion site: ? More redness, swelling, or pain. ? Fluid or blood that stops after applying firm pressure to the area. ? Warmth when you touch the area. ? Pus or a bad smell.  Have a fever.  Are sweating a lot.  Feel nauseous.  Have pain or numbness in the arm or leg closest to your insertion site. Get help right away if:  Your insertion site suddenly swells.  Your insertion site is bleeding and the bleeding does not stop after applying firm pressure to the area.  You  have chest pain or discomfort that spreads to your neck, jaw, or arm.  You have a fast or irregular heartbeat.  You have shortness of breath.  You are dizzy or light-headed and feel the need to lie down. These symptoms may represent a serious problem that is an emergency. Do not wait to see if the symptoms will go away. Get medical help right away. Call your local emergency services (911 in the [place] Do not drive yourself to the hospital. Summary  After the procedure, it is common to have bruising and tenderness at the  insertion site in your groin or your neck.  Check your insertion site every day for more redness, swelling, or pain. These are signs of infection.  Contact a health care provider if you notice any signs of infection. Also, contact a health care provider if you start sweating a lot, feel nauseous, or have pain or numbness in the arm or leg closest to your insertion site.  Get help right away if your puncture site is bleeding and the bleeding does not stop after applying firm pressure to the area. This is a medical emergency. This information is not intended to replace advice given to you by your health care provider. Make sure you discuss any questions you have with your health care provider. Document Revised: 09/10/2019 Document Reviewed: 09/10/2019 Elsevier Patient Education  [place].

## 2021-02-18 NOTE — Anesthesia Postprocedure Evaluation (Signed)
Anesthesia Post Note  Patient: Nicole Norton  Procedure(s) Performed: ATRIAL FIBRILLATION ABLATION (N/A )     Patient location during evaluation: PACU Anesthesia Type: General Level of consciousness: awake and alert and oriented Pain management: pain level controlled Vital Signs Assessment: post-procedure vital signs reviewed and stable Respiratory status: spontaneous breathing, nonlabored ventilation and respiratory function stable Cardiovascular status: blood pressure returned to baseline and stable Postop Assessment: no apparent nausea or vomiting Anesthetic complications: no   No complications documented.  Last Vitals:  Vitals:   02/18/21 1440 02/18/21 1441  BP: (!) 147/73   Pulse: (!) 55   Resp: 11   Temp:  36.5 C  SpO2: 97%     Last Pain:  Vitals:   02/18/21 1441  TempSrc: Temporal  PainSc:                  Irven Ingalsbe A.

## 2021-02-18 NOTE — Transfer of Care (Signed)
Immediate Anesthesia Transfer of Care Note  Patient: Nicole Norton  Procedure(s) Performed: ATRIAL FIBRILLATION ABLATION (N/A )  Patient Location: Cath Lab  Anesthesia Type:General  Level of Consciousness: awake, alert , oriented, patient cooperative and responds to stimulation  Airway & Oxygen Therapy: Patient Spontanous Breathing and Patient connected to face mask oxygen  Post-op Assessment: Report given to RN and Post -op Vital signs reviewed and stable  Post vital signs: Reviewed and stable  Last Vitals:  Vitals Value Taken Time  BP    Temp    Pulse 62 02/18/21 1405  Resp 10 02/18/21 1405  SpO2 95 % 02/18/21 1405  Vitals shown include unvalidated device data.  Last Pain:  Vitals:   02/18/21 1012  TempSrc:   PainSc: 4          Complications: No complications documented.

## 2021-02-18 NOTE — Progress Notes (Addendum)
Patient ambulated w/ assist to BR. And voided. Both groins remain stable dressings CDI. Went over d/c instructions w/ patient and sent home for friend that would be staying with patient could review.
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
## 2021-02-19 ENCOUNTER — Encounter (HOSPITAL_COMMUNITY): Payer: Self-pay | Admitting: Cardiology

## 2021-02-27 ENCOUNTER — Other Ambulatory Visit: Payer: Self-pay | Admitting: Cardiovascular Disease

## 2021-02-27 NOTE — Telephone Encounter (Signed)
Pt requesting refill of xarelto 20mg  when based on ccr they need lower dosage of 15mg . Will route to pharmd pool for clarification.74f, 104.2kg, scr 1.64, ccr 47.3, lovw/camnitz 01/06/21

## 2021-02-27 NOTE — Telephone Encounter (Signed)
Patient CrCl bouncing on both sides of 50 ml/min.  Will fill x 3 months then review before making change.  Most recent was day of ablation and SCr elevated compared to 2 days prior.

## 2021-03-12 DIAGNOSIS — M170 Bilateral primary osteoarthritis of knee: Secondary | ICD-10-CM | POA: Diagnosis not present

## 2021-03-12 DIAGNOSIS — G894 Chronic pain syndrome: Secondary | ICD-10-CM | POA: Diagnosis not present

## 2021-03-12 DIAGNOSIS — M47812 Spondylosis without myelopathy or radiculopathy, cervical region: Secondary | ICD-10-CM | POA: Diagnosis not present

## 2021-03-12 DIAGNOSIS — M47817 Spondylosis without myelopathy or radiculopathy, lumbosacral region: Secondary | ICD-10-CM | POA: Diagnosis not present

## 2021-03-17 ENCOUNTER — Other Ambulatory Visit: Payer: Self-pay | Admitting: Family Medicine

## 2021-03-18 ENCOUNTER — Other Ambulatory Visit: Payer: Self-pay

## 2021-03-18 ENCOUNTER — Ambulatory Visit (HOSPITAL_COMMUNITY)
Admission: RE | Admit: 2021-03-18 | Discharge: 2021-03-18 | Disposition: A | Discharge status: Home / Self Care | Payer: Medicare Other | Source: Ambulatory Visit | Attending: Physician Assistant | Admitting: Physician Assistant

## 2021-03-18 ENCOUNTER — Encounter (HOSPITAL_COMMUNITY): Payer: Self-pay | Admitting: Physician Assistant

## 2021-03-18 VITALS — BP 124/68 | HR 68 | Ht 66.0 in | Wt 229.6 lb

## 2021-03-18 DIAGNOSIS — I080 Rheumatic disorders of both mitral and aortic valves: Secondary | ICD-10-CM | POA: Insufficient documentation

## 2021-03-18 DIAGNOSIS — I700 Atherosclerosis of aorta: Secondary | ICD-10-CM | POA: Diagnosis not present

## 2021-03-18 DIAGNOSIS — I4892 Unspecified atrial flutter: Secondary | ICD-10-CM | POA: Diagnosis not present

## 2021-03-18 DIAGNOSIS — I4819 Other persistent atrial fibrillation: Secondary | ICD-10-CM | POA: Insufficient documentation

## 2021-03-18 DIAGNOSIS — E669 Obesity, unspecified: Secondary | ICD-10-CM | POA: Diagnosis not present

## 2021-03-18 DIAGNOSIS — E785 Hyperlipidemia, unspecified: Secondary | ICD-10-CM | POA: Insufficient documentation

## 2021-03-18 DIAGNOSIS — Z87891 Personal history of nicotine dependence: Secondary | ICD-10-CM | POA: Diagnosis not present

## 2021-03-18 DIAGNOSIS — I5042 Chronic combined systolic (congestive) and diastolic (congestive) heart failure: Secondary | ICD-10-CM | POA: Insufficient documentation

## 2021-03-18 DIAGNOSIS — Z7901 Long term (current) use of anticoagulants: Secondary | ICD-10-CM | POA: Insufficient documentation

## 2021-03-18 DIAGNOSIS — I110 Hypertensive heart disease with heart failure: Secondary | ICD-10-CM | POA: Insufficient documentation

## 2021-03-18 DIAGNOSIS — D6869 Other thrombophilia: Secondary | ICD-10-CM
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
## 2021-03-26 ENCOUNTER — Other Ambulatory Visit: Payer: Self-pay | Admitting: Family Medicine

## 2021-04-03 ENCOUNTER — Other Ambulatory Visit: Payer: Self-pay | Admitting: Family Medicine

## 2021-04-15 ENCOUNTER — Telehealth: Payer: Self-pay | Admitting: *Deleted

## 2021-04-15 NOTE — Chronic Care Management (AMB) (Signed)
  Chronic Care Management   Outreach Note  04/15/2021 Name: ASCENCION STEGNER MRN: [id number] DOB: [birth year]  SAKEENA TEALL is a 80 y.o. year old female who is a primary care patient of [name], Rene Kocher, MD. I reached out to Delsa Bern by phone today in response to a referral sent by Ms. Suan Halter Hilaire's PCP, Hali Marry, MD     An unsuccessful telephone outreach was attempted today. The patient was referred to the case management team for assistance with care management and care coordination.   Follow Up Plan: A HIPAA compliant phone message was left for the patient providing contact information and requesting a return call. The care management team will reach out to the patient again over the next 7 days. If patient returns call to provider office, please advise to call [place] at [phone].  [place]

## 2021-04-21 ENCOUNTER — Other Ambulatory Visit: Payer: Self-pay | Admitting: Family Medicine

## 2021-04-23 NOTE — Chronic Care Management (AMB) (Signed)
  Chronic Care Management   Outreach Note  04/23/2021 Name: Nicole Norton MRN: [id number] DOB: 01-02-1941  Nicole Norton is a 80 y.o. year old female who is a primary care patient of [name], Rene Kocher, MD. I reached out to Delsa Bern by phone today in response to a referral sent by Ms. Suan Halter Dawn's PCP, Hali Marry, MD.      A second unsuccessful telephone outreach was attempted today. The patient was referred to the case management team for assistance with care management and care coordination.   Follow Up Plan: A HIPAA compliant phone message was left for the patient providing contact information and requesting a return call. The care management team will reach out to the patient again over the next 7 days. If patient returns call to provider office, please advise to call [place] at [phone].  [place] Management

## 2021-05-01 NOTE — Chronic Care Management (AMB) (Signed)
  Chronic Care Management   Outreach Note  05/01/2021 Name: STEPHENI CAMERON MRN: [id number] DOB: 12-05-1940  DESMOND TUFANO is a 80 y.o. year old female who is a primary care patient of [name], Rene Kocher, MD. I reached out to Delsa Bern by phone today in response to a referral sent by Ms. Suan Halter Michaelson's PCP, Hali Marry, MD.      Third unsuccessful telephone outreach was attempted today. The patient was referred to the case management team for assistance with care management and care coordination. The patient's primary care provider has been notified of our unsuccessful attempts to make or maintain contact with the patient. The care management team is pleased to engage with this patient at any time in the future should he/she be interested in assistance from the care management team.   Follow Up Plan: A HIPAA compliant phone message was left for the patient providing contact information and requesting a return call. If patient returns call to provider office, please advise to call [place] at [phone].  [place] Management

## 2021-05-04 ENCOUNTER — Other Ambulatory Visit: Payer: Self-pay

## 2021-05-04 MED ORDER — METOPROLOL SUCCINATE ER 25 MG PO TB24: 25.0000 mg | ORAL_TABLET | Freq: Two times a day (BID) | ORAL | 3 refills | Status: DC

## 2021-05-07 DIAGNOSIS — Z79891 Long term (current) use of opiate analgesic: Secondary | ICD-10-CM | POA: Diagnosis not present

## 2021-05-07 DIAGNOSIS — M170 Bilateral primary osteoarthritis of knee: Secondary | ICD-10-CM | POA: Diagnosis not present

## 2021-05-07 DIAGNOSIS — M47812 Spondylosis without myelopathy or radiculopathy, cervical region: Secondary | ICD-10-CM | POA: Diagnosis not present

## 2021-05-07 DIAGNOSIS — M47817 Spondylosis without myelopathy or radiculopathy, lumbosacral region: Secondary | ICD-10-CM | POA: Diagnosis not present

## 2021-05-07 DIAGNOSIS — G894 Chronic pain syndrome: Secondary | ICD-10-CM | POA: Diagnosis not present

## 2021-05-07 IMAGING — DX CHEST - 2 VIEW
2 series · 2 of 2 positions shown · non-contrast
Comparison: 02/28/2019

CLINICAL DATA: Follow-up pneumonia

EXAM:
CHEST - 2 VIEW

[chest pa]
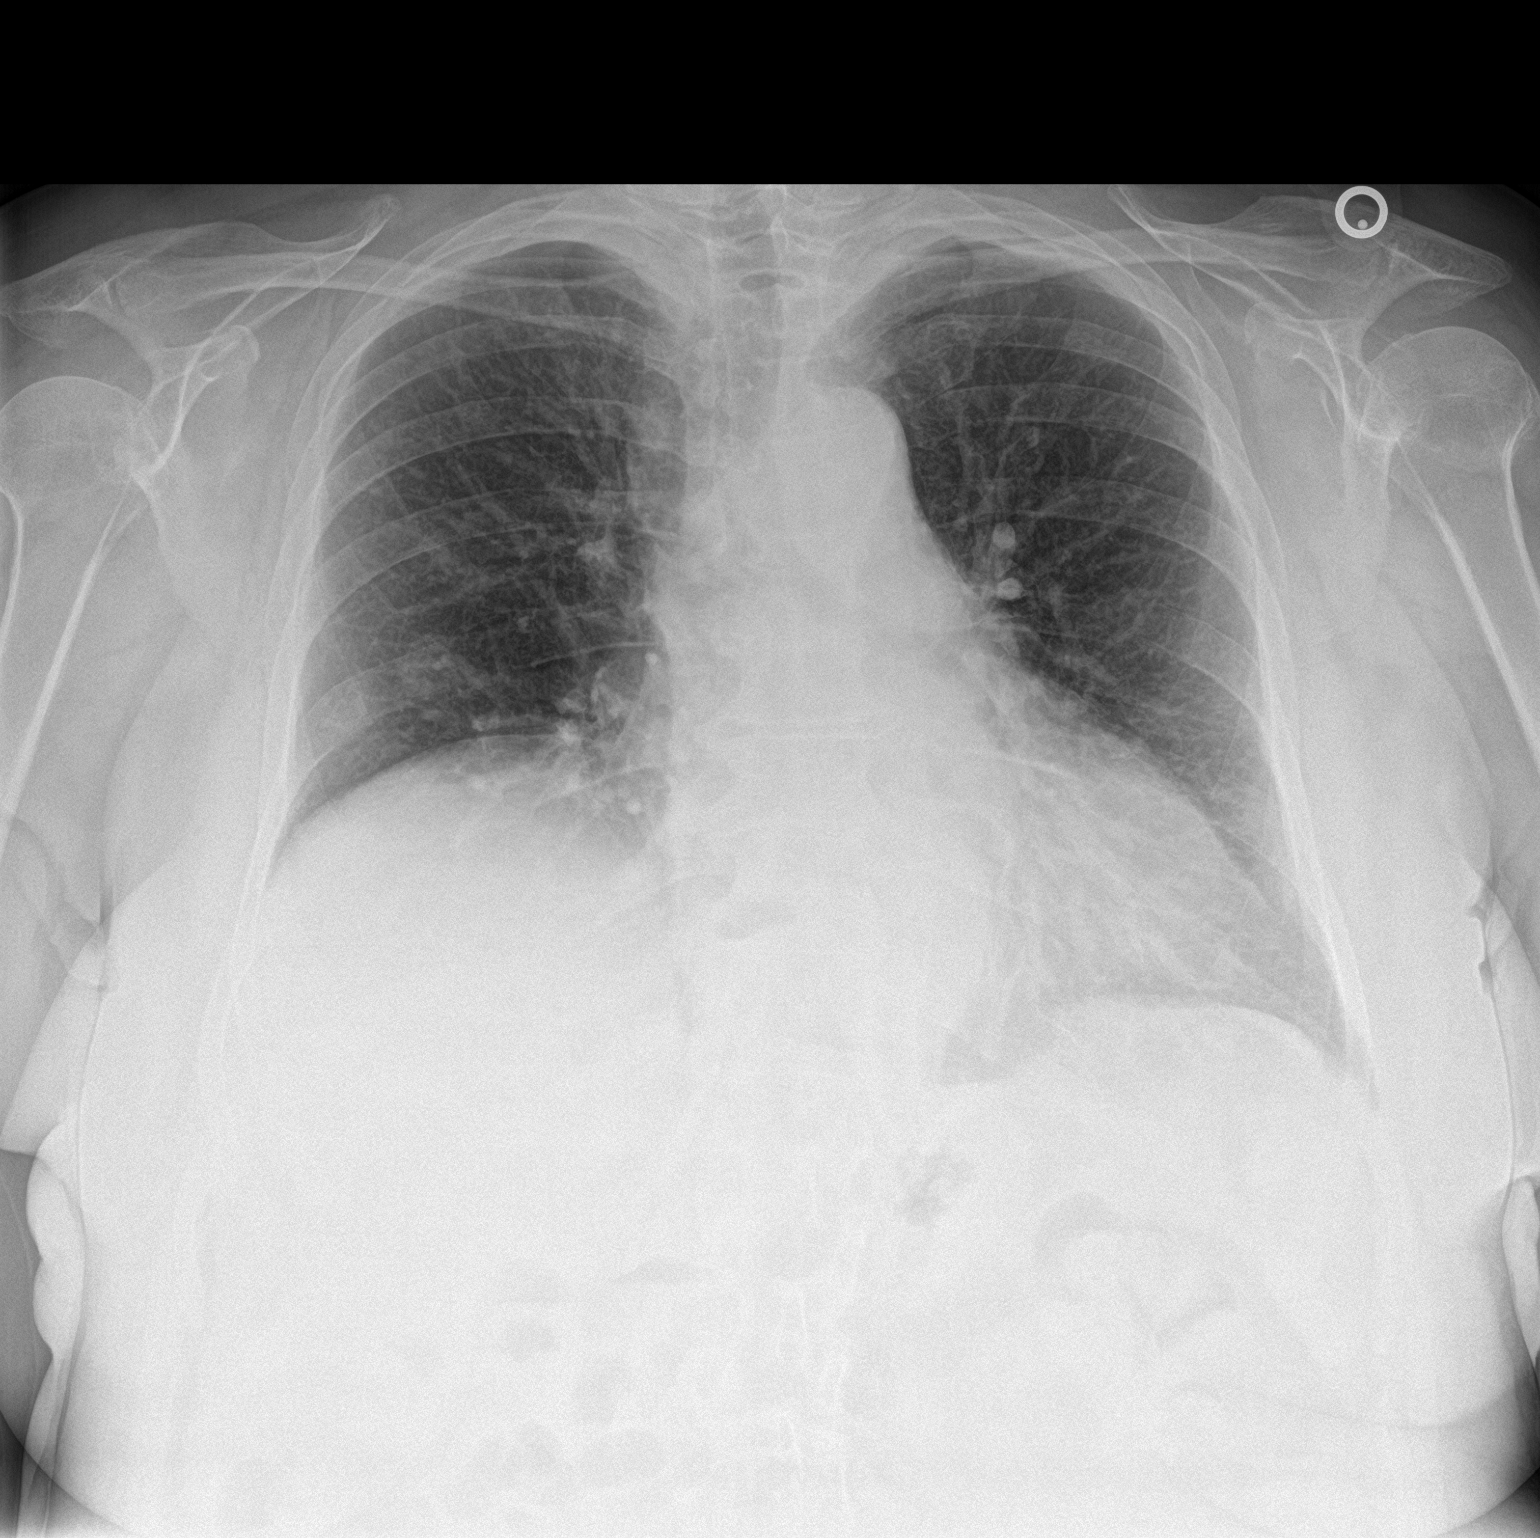

[chest lat]
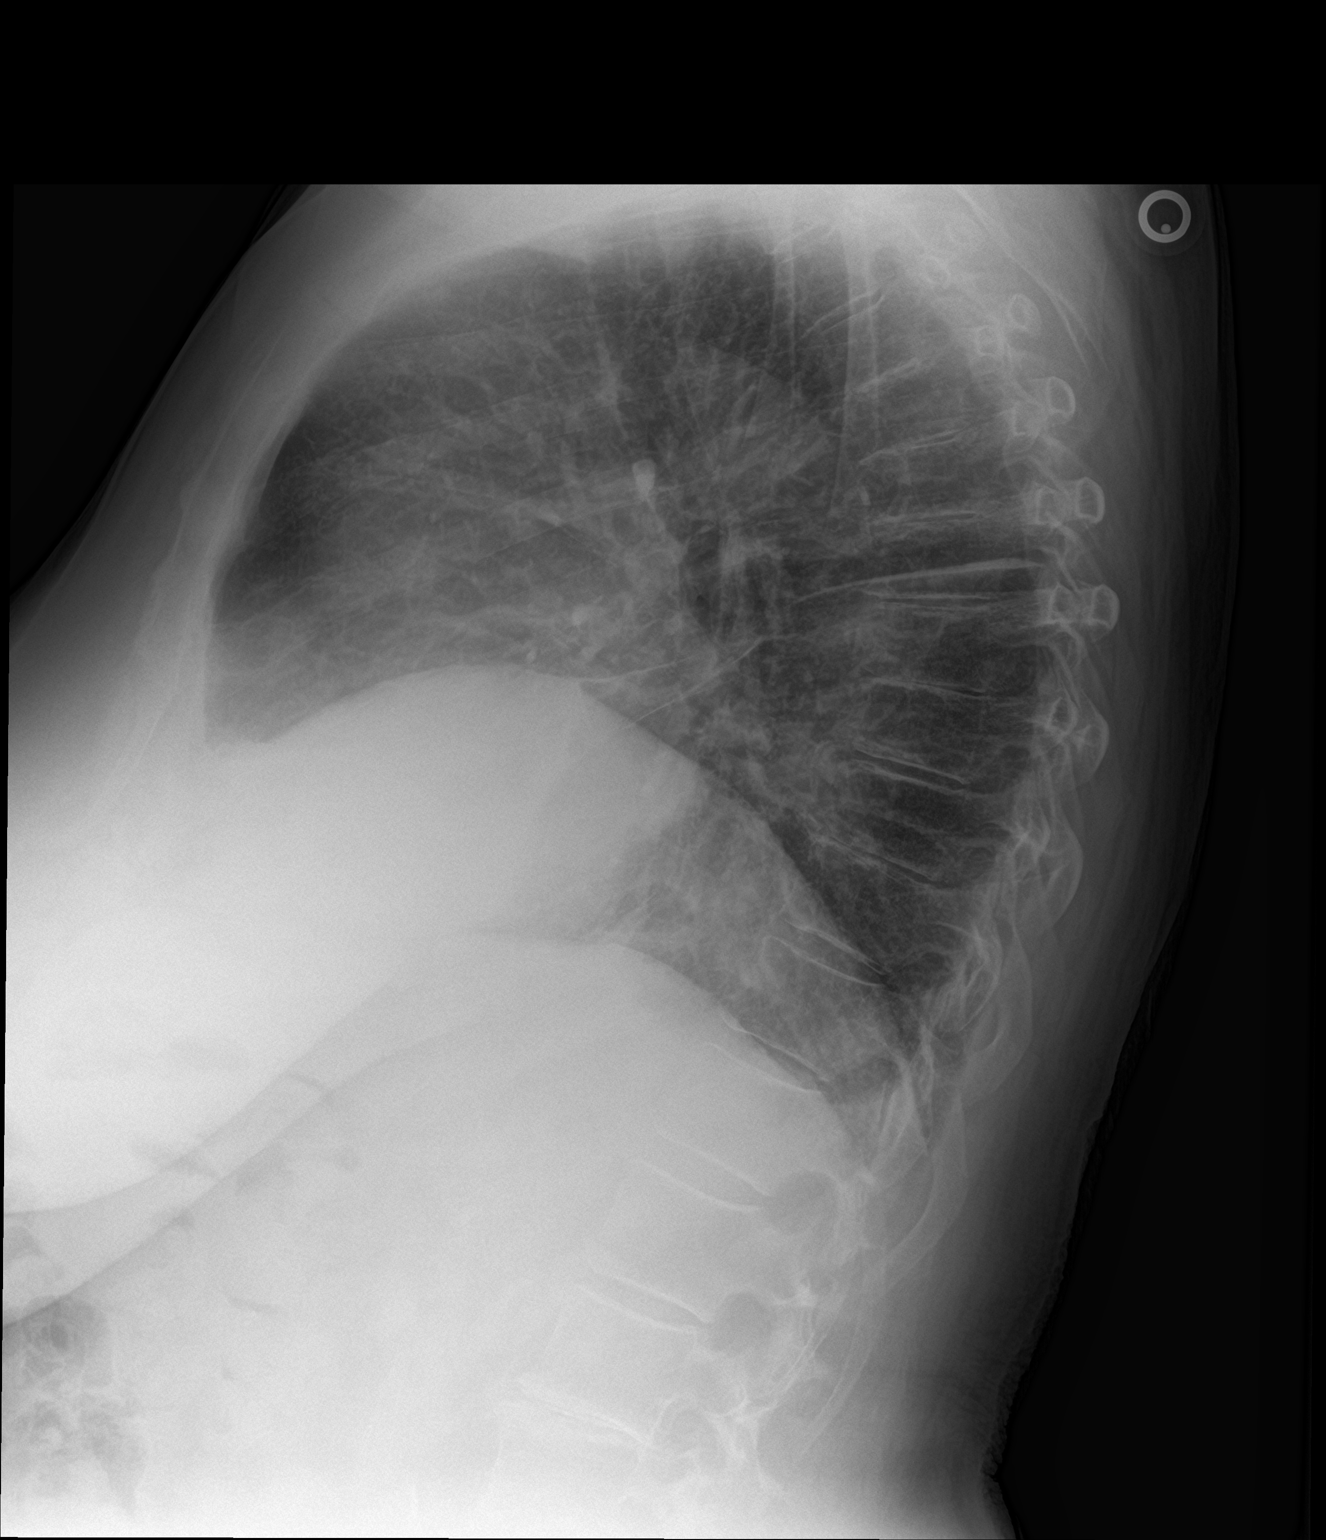

[2 of 2 positions shown; findings below may reference images not displayed]

FINDINGS: Cardiac shadow is stable. Elevation of the right hemidiaphragm is
seen. Lungs are clear bilaterally. Degenerative changes of the
thoracic spine are noted. Mild degenerative changes of the thoracic
spine are seen. Vague nodular density is noted over the anterior
aspect of the right fourth rib which may be related to a healing rib
fracture. Rib films may be helpful for further evaluation.
IMPRESSION: Somewhat nodular density overlying the anterior aspect of the right
fourth rib likely related to healing fracture. Rib films are
recommended for further evaluation.

No residual or recurrent infiltrate is seen.

These results will be called to the ordering clinician or
representative by the Radiologist Assistant, and communication
documented in the PACS or zVision Dashboard.

## 2021-05-08 ENCOUNTER — Other Ambulatory Visit: Payer: Self-pay

## 2021-05-08 MED ORDER — AMIODARONE HCL 200 MG PO TABS: 200.0000 mg | ORAL_TABLET | Freq: Every day | ORAL | 3 refills | Status: DC

## 2021-05-11 DIAGNOSIS — M301 Polyarteritis with lung involvement [Churg-Strauss]: Secondary | ICD-10-CM | POA: Diagnosis not present

## 2021-05-11 DIAGNOSIS — R0902 Hypoxemia: Secondary | ICD-10-CM | POA: Diagnosis not present

## 2021-05-11 DIAGNOSIS — I5089 Other heart failure: Secondary | ICD-10-CM | POA: Diagnosis not present

## 2021-05-14 DIAGNOSIS — Z7189 Other specified counseling: Secondary | ICD-10-CM | POA: Diagnosis not present

## 2021-05-14 DIAGNOSIS — G4734 Idiopathic sleep related nonobstructive alveolar hypoventilation: Secondary | ICD-10-CM | POA: Diagnosis not present

## 2021-05-14 DIAGNOSIS — Z87891 Personal history of nicotine dependence: Secondary | ICD-10-CM | POA: Diagnosis not present

## 2021-05-14 DIAGNOSIS — Z7185 Encounter for immunization safety counseling: Secondary | ICD-10-CM | POA: Diagnosis not present

## 2021-05-14 DIAGNOSIS — D7218 Eosinophilia in diseases classified elsewhere: Secondary | ICD-10-CM | POA: Diagnosis not present

## 2021-05-14 DIAGNOSIS — Z9981 Dependence on supplemental oxygen: Secondary | ICD-10-CM | POA: Diagnosis not present

## 2021-05-14 DIAGNOSIS — J9611 Chronic respiratory failure with hypoxia: Secondary | ICD-10-CM | POA: Diagnosis not present

## 2021-05-14 DIAGNOSIS — M301 Polyarteritis with lung involvement [Churg-Strauss]: Secondary | ICD-10-CM | POA: Diagnosis not present

## 2021-05-14 DIAGNOSIS — Z28311 Partially vaccinated for covid-19: Secondary | ICD-10-CM | POA: Diagnosis not present

## 2021-05-14 DIAGNOSIS — Z7952 Long term (current) use of systemic steroids: Secondary | ICD-10-CM | POA: Diagnosis not present

## 2021-05-20 DIAGNOSIS — Z20822 Contact with and (suspected) exposure to covid-19: Secondary | ICD-10-CM | POA: Diagnosis not present

## 2021-05-20 DIAGNOSIS — S0990XA Unspecified injury of head, initial encounter: Secondary | ICD-10-CM | POA: Diagnosis not present

## 2021-05-21 ENCOUNTER — Other Ambulatory Visit: Payer: Self-pay | Admitting: Cardiovascular Disease

## 2021-05-25 ENCOUNTER — Encounter: Payer: Self-pay | Admitting: Family Medicine

## 2021-05-25 ENCOUNTER — Ambulatory Visit (INDEPENDENT_AMBULATORY_CARE_PROVIDER_SITE_OTHER): Payer: Medicare Other | Admitting: Family Medicine

## 2021-05-25 ENCOUNTER — Ambulatory Visit (INDEPENDENT_AMBULATORY_CARE_PROVIDER_SITE_OTHER): Payer: Medicare Other

## 2021-05-25 ENCOUNTER — Other Ambulatory Visit: Payer: Self-pay

## 2021-05-25 DIAGNOSIS — S82891A Other fracture of right lower leg, initial encounter for closed fracture: Secondary | ICD-10-CM | POA: Diagnosis not present

## 2021-05-25 DIAGNOSIS — M7989 Other specified soft tissue disorders: Secondary | ICD-10-CM | POA: Diagnosis not present

## 2021-05-25 DIAGNOSIS — M25571 Pain in right ankle and joints of right foot: Secondary | ICD-10-CM | POA: Diagnosis not present

## 2021-05-25 NOTE — Patient Instructions (Signed)
Arrive at least 30 minutes early for your follow-up appointment and get x-ray before checking in for appointment with Dr. Darene Lamer. Continue current pain medicine Rest, ice, elevation, boot.  Try to avoid weight-bearing activities as much as possible. Use crutches if tolerated.
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
## 2021-05-26 ENCOUNTER — Ambulatory Visit: Payer: Medicare Other | Admitting: Family Medicine

## 2021-05-26 NOTE — Progress Notes (Signed)
MyChart message sent: Official xray report confirms what we discussed yesterday. Continue current plan and follow-up with Dr. Darene Lamer in a few weeks for repeat xray.

## 2021-06-02 ENCOUNTER — Other Ambulatory Visit: Payer: Self-pay

## 2021-06-02 ENCOUNTER — Other Ambulatory Visit: Payer: Self-pay | Admitting: Sports Medicine

## 2021-06-02 ENCOUNTER — Ambulatory Visit (INDEPENDENT_AMBULATORY_CARE_PROVIDER_SITE_OTHER): Payer: Medicare Other | Admitting: Cardiology

## 2021-06-02 ENCOUNTER — Ambulatory Visit (INDEPENDENT_AMBULATORY_CARE_PROVIDER_SITE_OTHER): Payer: Medicare Other

## 2021-06-02 ENCOUNTER — Encounter: Payer: Self-pay | Admitting: Cardiology

## 2021-06-02 ENCOUNTER — Ambulatory Visit (INDEPENDENT_AMBULATORY_CARE_PROVIDER_SITE_OTHER): Payer: Medicare Other | Admitting: Sports Medicine

## 2021-06-02 VITALS — BP 141/77 | HR 65 | Ht 66.0 in | Wt 233.0 lb

## 2021-06-02 DIAGNOSIS — M542 Cervicalgia: Secondary | ICD-10-CM | POA: Diagnosis not present

## 2021-06-02 DIAGNOSIS — S82831A Other fracture of upper and lower end of right fibula, initial encounter for closed fracture: Secondary | ICD-10-CM | POA: Diagnosis not present

## 2021-06-02 DIAGNOSIS — M533 Sacrococcygeal disorders, not elsewhere classified: Secondary | ICD-10-CM | POA: Insufficient documentation

## 2021-06-02 DIAGNOSIS — S82841A Displaced bimalleolar fracture of right lower leg, initial encounter for closed fracture: Secondary | ICD-10-CM

## 2021-06-02 DIAGNOSIS — S82301A Unspecified fracture of lower end of right tibia, initial encounter for closed fracture: Secondary | ICD-10-CM | POA: Diagnosis not present

## 2021-06-02 DIAGNOSIS — I4819 Other persistent atrial fibrillation: Secondary | ICD-10-CM

## 2021-06-02 DIAGNOSIS — S82842A Displaced bimalleolar fracture of left lower leg, initial encounter for closed fracture: Secondary | ICD-10-CM

## 2021-06-02 MED ORDER — RIVAROXABAN 15 MG PO TABS: 15.0000 mg | ORAL_TABLET | Freq: Two times a day (BID) | ORAL | 1 refills | Status: DC

## 2021-06-02 MED ORDER — RIVAROXABAN 15 MG PO TABS: 15.0000 mg | ORAL_TABLET | Freq: Every day | ORAL | 1 refills | Status: DC

## 2021-06-02 NOTE — Progress Notes (Addendum)
    Procedures performed today:    None.  Independent interpretation of notes and tests performed by another provider:   I personally reviewed the x-rays, there is a transverse fracture through the distal fibula with only minimal displacement, there is also a tiny avulsion from the medial malleolus, there is no evidence of shift of the ankle mortise.  Brief History, Exam, Impression, and Recommendations:    Closed bimalleolar fracture of right ankle This is a pleasant 80 year old female, approximately week ago she took a misstep and injured her right ankle, she had pain, swelling, inability to bear weight. X-rays showed a transverse fracture through the distal fibula, she also had a tiny avulsion from the medial malleolus but no evidence of shift of the mortise. In addition she has some pain at the metatarsal necks. She has been in a boot with questionable compliance. We discussed the importance of immobilization and limited weightbearing, she will get a compression sleeve, elevate her leg, ice it. I will get updated x-rays of her ankle and foot today. She still a bit too much swollen to put a cast on but I do see a cast in the future, possibly at her follow-up appointment next week. She is with a pain clinic and has topical fentanyl and oxycodone for use as needed for pain.  Coccydynia After her initial fall and had another fall directly onto her bottom, she has some minimal pain to palpation over the distal sacrum and coccyx, I will get some x-rays.    ___________________________________________ Gwen Her. Dianah Field, M.D., ABFM., CAQSM. Primary Care and [place] Instructor of [place] [facility]

## 2021-06-02 NOTE — Addendum Note (Signed)
Addended by: Stanton Kidney on: 06/02/2021 04:34 PM   Modules accepted: Orders

## 2021-06-02 NOTE — Assessment & Plan Note (Signed)
After her initial fall and had another fall directly onto her bottom, she has some minimal pain to palpation over the distal sacrum and coccyx, I will get some x-rays.

## 2021-06-02 NOTE — Assessment & Plan Note (Addendum)
This is a pleasant 80 year old female, approximately week ago she took a misstep and injured her right ankle, she had pain, swelling, inability to bear weight. X-rays showed a transverse fracture through the distal fibula, she also had a tiny avulsion from the medial malleolus but no evidence of shift of the mortise. In addition she has some pain at the metatarsal necks. She has been in a boot with questionable compliance. We discussed the importance of immobilization and limited weightbearing, she will get a compression sleeve, elevate her leg, ice it. I will get updated x-rays of her ankle and foot today. She still a bit too much swollen to put a cast on but I do see a cast in the future, possibly at her follow-up appointment next week. She is with a pain clinic and has topical fentanyl and oxycodone for use as needed for pain.
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
## 2021-06-02 NOTE — Addendum Note (Signed)
Addended by: Stanton Kidney on: 06/02/2021 03:31 PM   Modules accepted: Orders

## 2021-06-03 DIAGNOSIS — I4819 Other persistent atrial fibrillation: Secondary | ICD-10-CM | POA: Diagnosis not present

## 2021-06-03 DIAGNOSIS — Z79899 Other long term (current) drug therapy: Secondary | ICD-10-CM | POA: Diagnosis not present

## 2021-06-04 DIAGNOSIS — M47817 Spondylosis without myelopathy or radiculopathy, lumbosacral region: Secondary | ICD-10-CM | POA: Diagnosis not present

## 2021-06-04 DIAGNOSIS — M170 Bilateral primary osteoarthritis of knee: Secondary | ICD-10-CM | POA: Diagnosis not present

## 2021-06-04 DIAGNOSIS — M47812 Spondylosis without myelopathy or radiculopathy, cervical region: Secondary | ICD-10-CM | POA: Diagnosis not present

## 2021-06-04 DIAGNOSIS — G894 Chronic pain syndrome: Secondary | ICD-10-CM | POA: Diagnosis not present

## 2021-06-08 ENCOUNTER — Ambulatory Visit (INDEPENDENT_AMBULATORY_CARE_PROVIDER_SITE_OTHER): Payer: Medicare Other

## 2021-06-08 ENCOUNTER — Other Ambulatory Visit: Payer: Self-pay

## 2021-06-08 ENCOUNTER — Encounter: Payer: Medicare Other | Admitting: Sports Medicine

## 2021-06-08 ENCOUNTER — Ambulatory Visit (INDEPENDENT_AMBULATORY_CARE_PROVIDER_SITE_OTHER): Payer: Medicare Other | Admitting: Sports Medicine

## 2021-06-08 DIAGNOSIS — S82891A Other fracture of right lower leg, initial encounter for closed fracture: Secondary | ICD-10-CM

## 2021-06-08 DIAGNOSIS — M7989 Other specified soft tissue disorders: Secondary | ICD-10-CM | POA: Diagnosis not present

## 2021-06-08 DIAGNOSIS — S82841D Displaced bimalleolar fracture of right lower leg, subsequent encounter for closed fracture with routine healing: Secondary | ICD-10-CM | POA: Diagnosis not present

## 2021-06-08 NOTE — Assessment & Plan Note (Signed)
Nicole Norton is now approximately 2 weeks post bimalleolar fracture of her right ankle, x-rays today are stable, I placed her in a short leg walking cast, she will minimize weightbearing with a Rollator, we also added a lift to her left shoe, she is having some pain in the right hip due to the iatrogenic leg length discrepancy. I would like her to do some home health physical therapy for her hips. Return to see me in 1 month for cast removal. Of note she is in a pain clinic and has topical fentanyl and oxycodone as needed for pain.

## 2021-06-08 NOTE — Progress Notes (Signed)
    Procedures performed today:    Short leg walking cast applied today.  Independent interpretation of notes and tests performed by another provider:   I personally reviewed the x-rays, I can still see the transverse fracture through the fibula, displacement looks better.  Brief History, Exam, Impression, and Recommendations:    Closed bimalleolar fracture of right ankle Nicole Norton is now approximately 2 weeks post bimalleolar fracture of her right ankle, x-rays today are stable, I placed her in a short leg walking cast, she will minimize weightbearing with a Rollator, we also added a lift to her left shoe, she is having some pain in the right hip due to the iatrogenic leg length discrepancy. I would like her to do some home health physical therapy for her hips. Return to see me in 1 month for cast removal. Of note she is in a pain clinic and has topical fentanyl and oxycodone as needed for pain.    ___________________________________________ Gwen Her. Dianah Field, M.D., ABFM., CAQSM. Primary Care and [place] Instructor of [place] of [facility] of Medicine

## 2021-06-12 ENCOUNTER — Telehealth: Payer: Self-pay

## 2021-06-12 ENCOUNTER — Ambulatory Visit (INDEPENDENT_AMBULATORY_CARE_PROVIDER_SITE_OTHER): Payer: Medicare Other | Admitting: Sports Medicine

## 2021-06-12 ENCOUNTER — Other Ambulatory Visit: Payer: Self-pay

## 2021-06-12 DIAGNOSIS — S82841D Displaced bimalleolar fracture of right lower leg, subsequent encounter for closed fracture with routine healing: Secondary | ICD-10-CM | POA: Diagnosis not present

## 2021-06-12 NOTE — Telephone Encounter (Signed)
Becky with [facility] called to report that patient only wants to visits total. She states that they will do 2 visits in one week. Verbal Order needed.

## 2021-06-12 NOTE — Progress Notes (Signed)
    Procedures performed today:    None.  Independent interpretation of notes and tests performed by another provider:   None.  Brief History, Exam, Impression, and Recommendations:    Closed bimalleolar fracture of right ankle Nicole Norton returns, she is a pleasant 80 year old female, she is approximately 3 weeks post bimalleolar fracture of the right ankle, x-rays were stable, she was placed in a short leg walking cast, minimize weightbearing with a Rollator. She does have a pain clinic and gets topical fentanyl and oxycodone from there for pain. She was here complaining of some increasing pressure sensation over the dorsum of her foot, the toes are neurovascularly intact. Due to the pressure sensation I did valve the cast along the lateral aspect to relieve some of the pressure, she can wear a nonslip sock for ambulation. Return at previously planned appointment.    ___________________________________________ Gwen Her. Dianah Field, M.D., ABFM., CAQSM. Primary Care and [place] Instructor of [place] of [facility] of Medicine

## 2021-06-12 NOTE — Assessment & Plan Note (Signed)
Nicole Norton returns, she is a pleasant 80 year old female, she is approximately 3 weeks post bimalleolar fracture of the right ankle, x-rays were stable, she was placed in a short leg walking cast, minimize weightbearing with a Rollator. She does have a pain clinic and gets topical fentanyl and oxycodone from there for pain. She was here complaining of some increasing pressure sensation over the dorsum of her foot, the toes are neurovascularly intact. Due to the pressure sensation I did valve the cast along the lateral aspect to relieve some of the pressure, she can wear a nonslip sock for ambulation. Return at previously planned appointment.

## 2021-06-14 NOTE — Telephone Encounter (Signed)
Sure, order given.

## 2021-06-15 DIAGNOSIS — R079 Chest pain, unspecified: Secondary | ICD-10-CM | POA: Diagnosis not present

## 2021-06-15 DIAGNOSIS — R0902 Hypoxemia: Secondary | ICD-10-CM | POA: Diagnosis not present

## 2021-06-15 DIAGNOSIS — R410 Disorientation, unspecified: Secondary | ICD-10-CM | POA: Diagnosis not present

## 2021-06-15 DIAGNOSIS — Z20822 Contact with and (suspected) exposure to covid-19: Secondary | ICD-10-CM | POA: Diagnosis not present

## 2021-06-15 DIAGNOSIS — R0789 Other chest pain: Secondary | ICD-10-CM | POA: Diagnosis not present

## 2021-06-15 DIAGNOSIS — I288 Other diseases of pulmonary vessels: Secondary | ICD-10-CM | POA: Diagnosis not present

## 2021-06-15 DIAGNOSIS — R9431 Abnormal electrocardiogram [ECG] [EKG]: Secondary | ICD-10-CM | POA: Diagnosis not present

## 2021-06-15 DIAGNOSIS — R918 Other nonspecific abnormal finding of lung field: Secondary | ICD-10-CM | POA: Diagnosis not present

## 2021-06-15 DIAGNOSIS — I517 Cardiomegaly: Secondary | ICD-10-CM | POA: Diagnosis not present

## 2021-06-15 DIAGNOSIS — R0602 Shortness of breath: Secondary | ICD-10-CM | POA: Diagnosis not present

## 2021-06-15 DIAGNOSIS — R4182 Altered mental status, unspecified: Secondary | ICD-10-CM | POA: Diagnosis not present

## 2021-06-15 DIAGNOSIS — R531 Weakness: Secondary | ICD-10-CM | POA: Diagnosis not present

## 2021-06-15 DIAGNOSIS — R1084 Generalized abdominal pain: Secondary | ICD-10-CM | POA: Diagnosis not present

## 2021-06-15 DIAGNOSIS — K449 Diaphragmatic hernia without obstruction or gangrene: Secondary | ICD-10-CM | POA: Diagnosis not present

## 2021-06-15 DIAGNOSIS — J986 Disorders of diaphragm: Secondary | ICD-10-CM | POA: Diagnosis not present

## 2021-06-15 NOTE — Telephone Encounter (Signed)
Spoke with [place], VO given.

## 2021-06-18 ENCOUNTER — Telehealth: Payer: Self-pay

## 2021-06-18 NOTE — Telephone Encounter (Signed)
Called [name] and gave verbal orders.

## 2021-06-18 NOTE — Telephone Encounter (Signed)
[name] from [facility] called. Pt is to have 2 nurse visits for ankle fx however pt over-medicated on Fentanyl and ended up in hospital overnight. Pt would like additional nurse visits for 1 month . Jackelyn Poling is calling for orders regarding this

## 2021-06-18 NOTE — Telephone Encounter (Signed)
OK as below

## 2021-06-23 ENCOUNTER — Ambulatory Visit (INDEPENDENT_AMBULATORY_CARE_PROVIDER_SITE_OTHER): Payer: Medicare Other | Admitting: Cardiovascular Disease

## 2021-06-23 ENCOUNTER — Other Ambulatory Visit: Payer: Self-pay

## 2021-06-23 ENCOUNTER — Encounter: Payer: Self-pay | Admitting: Cardiovascular Disease

## 2021-06-23 DIAGNOSIS — E785 Hyperlipidemia, unspecified: Secondary | ICD-10-CM

## 2021-06-23 DIAGNOSIS — I5043 Acute on chronic combined systolic (congestive) and diastolic (congestive) heart failure: Secondary | ICD-10-CM

## 2021-06-23 DIAGNOSIS — I10 Essential (primary) hypertension: Secondary | ICD-10-CM

## 2021-06-23 DIAGNOSIS — I4891 Unspecified atrial fibrillation: Secondary | ICD-10-CM

## 2021-06-23 NOTE — Patient Instructions (Signed)

## 2021-06-23 NOTE — Assessment & Plan Note (Signed)
History of essential hypertension a blood pressure measured today at 124/72.  She is on metoprolol.

## 2021-06-23 NOTE — Assessment & Plan Note (Signed)
History of atrial fibrillation status post A. fib ablation by Dr. Curt Bears 02/18/2021.  She still has not had recurrent A. fib.  She is currently on Xarelto.

## 2021-06-23 NOTE — Assessment & Plan Note (Signed)
History of chronic diastolic heart failure with echo performed 05/20/2020 revealing normal LV systolic function with grade 2 diastolic dysfunction.  She is on Bumex with a creatinine of 1.6 and is aware of salt restriction.

## 2021-06-23 NOTE — Progress Notes (Signed)
06/23/2021 Nicole Norton   Sep 02, 1941  [id number]  Primary Physician Hali Marry, MD Primary Cardiologist: Lorretta Harp MD FACP, [place], [place], [place]  HPI:  Nicole Norton is a 80 y.o.  mildly overweight married Caucasian female with no children who I last saw in the office 01/17/2018.  Her  husband [name] was also a patient of mine. I last saw her in the office 06/18/2020 She has a history of hypertension, hyperlipidemia and an arrhythmia which I diagnosed 10-15 years ago. She has never had a heart attack or stroke. She's had multiple stress test off which have been normal as well as 2-D echocardiograms and event monitors. She is statin intolerant. She denies chest pain or shortness of breath. She recently was diagnosed with gout as well as osteoarthritis arthritis. [name] She was placed on gout medicines with resulted in exacerbation of her arrhythmia. Once these medicines were discontinued her arrhythmia returned to baseline. An event monitor showed episodes of paroxysmal atrial fibrillation. A routine GXT was normal. I referred her to Dr. Curt Bears , electrophysiology, who placed her on flecainide and Xarelto. Her A. fib has markedly improved as has her blood pressure control.      She has developed eosinophilic pneumonia is being treated by pulmonologist at [facility] with steroids. Higher doses have exacerbated her atrial fibrillation.  I did see her during her hospitalization in June 2020 where she came in volume overloaded approxitwenty 5 pounds secondary to prednisone and was diuresed.   She was admitted to the hospital 05/19/2020 and discharged home 1 week later.  She had mildly elevated enzymes thought to be related to demand ischemia as well as atrial fibrillation which converted to sinus rhythm.  She was diuresed and was discharged home on oral diuretics.  She is aware of salt restriction feels clinically improved.  She is on constant home O2 desaturates fairly  quickly with activity.  2D echo performed  on 05/20/2020 revealed normal LV systolic function with grade 2 diastolic dysfunction and a subsequent outpatient Myoview stress test was negative for ischemia.  Since I saw her a year ago she did fall and break her right ankle and is currently in a cast, wheelchair-bound.  Hopefully she will have the cast removed sometime in the coming weeks.  She had A. fib ablation performed by Dr. Curt Bears successfully 02/18/2021 and has not had recurrent episode.  She denies chest pain or shortness of breath.   No outpatient medications have been marked as taking for the 06/23/21 encounter (Office Visit) with Lorretta Harp, MD.     Allergies  Allergen Reactions   Allopurinol Palpitations   Uloric [Febuxostat] Other (See Comments)    Episodes of AFIB   Ace Inhibitors Other (See Comments)    Renal function   Colchicine Other (See Comments)    palpitations   Dilaudid [Hydromorphone] Other (See Comments)    Decreased respiratory drive   Etodolac Other (See Comments)    heart flutters and headaches    Omeprazole Nausea Only   Paroxetine Nausea Only    Dizzy   Solu-Medrol [Methylprednisolone Sodium Succ]     Heart palpitations/bad headache   Statins Tinitus    myalgias    Lasix [Furosemide] Rash    Does not work very well   Penicillins Rash    Tolerated Ceftriaxone 4/10-4/11/20 \ Did it involve swelling of the face/tongue/throat, SOB, or low BP? No Did it involve sudden or severe rash/hives, skin  peeling, or any reaction on the inside of your mouth or nose? No Did you need to seek medical attention at a hospital or doctor's office? No When did it last happen?      35 years ago If all above answers are "NO", may proceed with cephalosporin use.    Social History   Socioeconomic History   Marital status: Widowed    Spouse name: Not on file   Number of children: 0   Years of education: 44   Highest education level: Master's degree (e.g., MA, MS, MEng,  MEd, MSW, MBA)  Occupational History   Occupation: Retired  Tobacco Use   Smoking status: Former    Packs/day: 1.00    Years: 20.00    Pack years: 20.00    Types: Cigarettes    Quit date: 11/16/1999    Years since quitting: 21.6   Smokeless tobacco: Never  Vaping Use   Vaping Use: Never used  Substance and Sexual Activity   Alcohol use: Yes    Alcohol/week: 7.0 standard drinks    Types: 6 Glasses of wine, 1 Standard drinks or equivalent per week   Drug use: No   Sexual activity: Not on file  Other Topics Concern   Not on file  Social History Narrative   Lives alone with her cat. She enjoys reading and hanging out with her friends.   Social Determinants of Health   Financial Resource Strain: Low Risk    Difficulty of Paying Living Expenses: Not hard at all  Food Insecurity: No Food Insecurity   Worried About Charity fundraiser in the Last Year: Never true   [place] in the Last Year: Never true  Transportation Needs: No Transportation Needs   Lack of Transportation (Medical): No   Lack of Transportation (Non-Medical): No  Physical Activity: Inactive   Days of Exercise per Week: 0 days   Minutes of Exercise per Session: 0 min  Stress: No Stress Concern Present   Feeling of Stress : Not at all  Social Connections: Socially Isolated   Frequency of Communication with Friends and Family: More than three times a week   Frequency of Social Gatherings with Friends and Family: More than three times a week   Attends Religious Services: Never   Marine scientist or Organizations: No   Attends Archivist Meetings: Never   Marital Status: Widowed  Human resources officer Violence: Not At Risk   Fear of Current or Ex-Partner: No   Emotionally Abused: No   Physically Abused: No   Sexually Abused: No     Review of Systems: General: negative for chills, fever, night sweats or weight changes.  Cardiovascular: negative for chest pain, dyspnea on exertion, edema,  orthopnea, palpitations, paroxysmal nocturnal dyspnea or shortness of breath Dermatological: negative for rash Respiratory: negative for cough or wheezing Urologic: negative for hematuria Abdominal: negative for nausea, vomiting, diarrhea, bright red blood per rectum, melena, or hematemesis Neurologic: negative for visual changes, syncope, or dizziness All other systems reviewed and are otherwise negative except as noted above.    Blood pressure 124/72, pulse (!) 58, height '5\' 6"'$  (1.676 m), weight 230 lb (104.3 kg).  General appearance: alert and no distress Neck: no adenopathy, no carotid bruit, no JVD, supple, symmetrical, trachea midline, and thyroid not enlarged, symmetric, no tenderness/mass/nodules Lungs: clear to auscultation bilaterally Heart: regular rate and rhythm, S1, S2 normal, no murmur, click, rub or gallop Extremities: extremities normal, atraumatic, no cyanosis or  edema Pulses: 2+ and symmetric Skin: Skin color, texture, turgor normal. No rashes or lesions Neurologic: Grossly normal  EKG not performed today  ASSESSMENT AND PLAN:   Hyperlipidemia LDL goal <160 History of hyperlipidemia intolerant to statin therapy with lipid profile performed 01/15/2020 revealing a total cholesterol 288, LDL 166 and HDL 69.  I am referring her to Dr. Debara Pickett for further management of her hyperlipidemia.  She may be a candidate for a PCSK9.  Essential hypertension, benign History of essential hypertension a blood pressure measured today at 124/72.  She is on metoprolol.  Atrial fibrillation with RVR (HCC) History of atrial fibrillation status post A. fib ablation by Dr. Curt Bears 02/18/2021.  She still has not had recurrent A. fib.  She is currently on Xarelto.  Acute on chronic combined systolic and diastolic CHF (congestive heart failure) (HCC) History of chronic diastolic heart failure with echo performed 05/20/2020 revealing normal LV systolic function with grade 2 diastolic dysfunction.   She is on Bumex with a creatinine of 1.6 and is aware of salt restriction.     Lorretta Harp MD FACP,FACC,FAHA, [facility] 06/23/2021 11:29 AM

## 2021-06-23 NOTE — Assessment & Plan Note (Signed)
History of hyperlipidemia intolerant to statin therapy with lipid profile performed 01/15/2020 revealing a total cholesterol 288, LDL 166 and HDL 69.  I am referring her to Dr. Debara Pickett for further management of her hyperlipidemia.  She may be a candidate for a PCSK9.

## 2021-06-24 ENCOUNTER — Ambulatory Visit: Payer: Medicare Other | Admitting: Sports Medicine

## 2021-07-01 DIAGNOSIS — M47817 Spondylosis without myelopathy or radiculopathy, lumbosacral region: Secondary | ICD-10-CM | POA: Diagnosis not present

## 2021-07-01 DIAGNOSIS — G894 Chronic pain syndrome: Secondary | ICD-10-CM | POA: Diagnosis not present

## 2021-07-01 DIAGNOSIS — M47812 Spondylosis without myelopathy or radiculopathy, cervical region: Secondary | ICD-10-CM | POA: Diagnosis not present

## 2021-07-01 DIAGNOSIS — M170 Bilateral primary osteoarthritis of knee: Secondary | ICD-10-CM | POA: Diagnosis not present

## 2021-07-06 ENCOUNTER — Ambulatory Visit (INDEPENDENT_AMBULATORY_CARE_PROVIDER_SITE_OTHER): Payer: Medicare Other

## 2021-07-06 ENCOUNTER — Ambulatory Visit (INDEPENDENT_AMBULATORY_CARE_PROVIDER_SITE_OTHER): Payer: Medicare Other | Admitting: Sports Medicine

## 2021-07-06 ENCOUNTER — Other Ambulatory Visit: Payer: Self-pay

## 2021-07-06 DIAGNOSIS — M7989 Other specified soft tissue disorders: Secondary | ICD-10-CM | POA: Diagnosis not present

## 2021-07-06 DIAGNOSIS — S82841D Displaced bimalleolar fracture of right lower leg, subsequent encounter for closed fracture with routine healing: Secondary | ICD-10-CM

## 2021-07-06 NOTE — Progress Notes (Signed)
    Procedures performed today:    None.  Independent interpretation of notes and tests performed by another provider:   None.  Brief History, Exam, Impression, and Recommendations:    Closed bimalleolar fracture of right ankle Nicole Norton returns, she is a pleasant 80 year old female, she is now approximately 7 weeks post bimalleolar fracture of the right ankle, she was in a cast for a month, she gets her topical fentanyl and oxycodone from her pain clinic. At the last visit she was having increasing pressure at the dorsum of the foot so I valved the cast laterally, today we removed the cast altogether, she really does not have any pain at the fracture, we will transition into a lace up ASO, get some x-rays today, and add physical therapy for strength and motion to her home health agency. Weightbearing as tolerated but she understands to minimize it. Return to see me in 4 weeks.    ___________________________________________ Gwen Her. Dianah Field, M.D., ABFM., CAQSM. Primary Care and [place] Instructor of [place] of [facility] of Medicine

## 2021-07-06 NOTE — Assessment & Plan Note (Signed)
Kem returns, she is a pleasant 80 year old female, she is now approximately 7 weeks post bimalleolar fracture of the right ankle, she was in a cast for a month, she gets her topical fentanyl and oxycodone from her pain clinic. At the last visit she was having increasing pressure at the dorsum of the foot so I valved the cast laterally, today we removed the cast altogether, she really does not have any pain at the fracture, we will transition into a lace up ASO, get some x-rays today, and add physical therapy for strength and motion to her home health agency. Weightbearing as tolerated but she understands to minimize it. Return to see me in 4 weeks.

## 2021-07-17 ENCOUNTER — Other Ambulatory Visit: Payer: Self-pay | Admitting: *Deleted

## 2021-07-17 MED ORDER — LEVOTHYROXINE SODIUM 200 MCG PO TABS: 200.0000 ug | ORAL_TABLET | Freq: Every day | ORAL | 1 refills | Status: DC

## 2021-07-23 ENCOUNTER — Ambulatory Visit (INDEPENDENT_AMBULATORY_CARE_PROVIDER_SITE_OTHER): Payer: Medicare Other | Admitting: Family Medicine

## 2021-07-23 ENCOUNTER — Other Ambulatory Visit: Payer: Self-pay

## 2021-07-23 ENCOUNTER — Encounter: Payer: Self-pay | Admitting: Family Medicine

## 2021-07-23 VITALS — BP 131/89 | HR 85 | Temp 97.7°F

## 2021-07-23 DIAGNOSIS — R319 Hematuria, unspecified: Secondary | ICD-10-CM | POA: Diagnosis not present

## 2021-07-23 LAB — POCT URINALYSIS DIP (CLINITEK)
Glucose, UA: NEGATIVE mg/dL
Ketones, POC UA: NEGATIVE mg/dL
Leukocytes, UA: NEGATIVE
Nitrite, UA: NEGATIVE
Spec Grav, UA: 1.02 (ref 1.010–1.025)
Urobilinogen, UA: 1 E.U./dL
pH, UA: 6 (ref 5.0–8.0)
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
## 2021-07-23 NOTE — Progress Notes (Signed)
Hematuria  - urinalysis shows no indication of infection but she does have some hematuria will send for microscopic review and reflex culture.  Not sure how this would be linked to epigastric pain.  If it were from her kidneys I would suspect more back pain which is all unusual.  Patient to call if symptoms worsen.  Nicole Lecher, MD

## 2021-07-24 ENCOUNTER — Ambulatory Visit: Payer: Medicare Other | Admitting: Osteopathic Medicine

## 2021-07-24 ENCOUNTER — Other Ambulatory Visit: Payer: Self-pay | Admitting: Family Medicine

## 2021-07-24 MED ORDER — SULFAMETHOXAZOLE-TRIMETHOPRIM 800-160 MG PO TABS: 1.0000 | ORAL_TABLET | Freq: Two times a day (BID) | ORAL | 0 refills | Status: DC

## 2021-07-24 NOTE — Progress Notes (Signed)
Hi Collyn, the initial urinalysis is showing a few bacteria.  It would take 3 days for the formal culture to come back but since were getting ready to head into the weekend I am get a go ahead and send over an antibiotic to treat for urinary tract infection.  We will need to recheck your urine in a couple weeks to make sure that the blood has gone away.

## 2021-07-24 NOTE — Addendum Note (Signed)
Addended by: Beatrice Lecher D on: 07/24/2021 07:44 AM   Modules accepted: Orders

## 2021-07-25 LAB — URINALYSIS W MICROSCOPIC + REFLEX CULTURE
Bilirubin Urine: NEGATIVE
Glucose, UA: NEGATIVE
Ketones, ur: NEGATIVE
Nitrites, Initial: NEGATIVE
Specific Gravity, Urine: 1.011 (ref 1.001–1.035)
pH: 6 (ref 5.0–8.0)

## 2021-07-25 LAB — URINE CULTURE
MICRO NUMBER:: 12352360
SPECIMEN QUALITY:: ADEQUATE

## 2021-07-25 LAB — CULTURE INDICATED

## 2021-07-27 NOTE — Progress Notes (Signed)
Urine culture shows multiple bacteria which just means that it is contaminated not a true sterile culture.  So recommend repeating the urinalysis with microalbumin to evaluate for red blood cells later this week.

## 2021-07-29 DIAGNOSIS — M47817 Spondylosis without myelopathy or radiculopathy, lumbosacral region: Secondary | ICD-10-CM | POA: Diagnosis not present

## 2021-07-29 DIAGNOSIS — M170 Bilateral primary osteoarthritis of knee: Secondary | ICD-10-CM | POA: Diagnosis not present

## 2021-07-29 DIAGNOSIS — M47812 Spondylosis without myelopathy or radiculopathy, cervical region: Secondary | ICD-10-CM | POA: Diagnosis not present

## 2021-07-29 DIAGNOSIS — G894 Chronic pain syndrome: Secondary | ICD-10-CM | POA: Diagnosis not present

## 2021-08-03 ENCOUNTER — Ambulatory Visit (INDEPENDENT_AMBULATORY_CARE_PROVIDER_SITE_OTHER): Payer: Medicare Other | Admitting: Sports Medicine

## 2021-08-03 ENCOUNTER — Other Ambulatory Visit: Payer: Self-pay

## 2021-08-03 DIAGNOSIS — S82841D Displaced bimalleolar fracture of right lower leg, subsequent encounter for closed fracture with routine healing: Secondary | ICD-10-CM

## 2021-08-03 DIAGNOSIS — M170 Bilateral primary osteoarthritis of knee: Secondary | ICD-10-CM

## 2021-08-03 MED ORDER — SUCRALFATE 1 GM/10ML PO SUSP: 1.0000 g | Freq: Three times a day (TID) | ORAL | 0 refills | Status: DC

## 2021-08-03 NOTE — Progress Notes (Signed)
    Procedures performed today:    None.  Independent interpretation of notes and tests performed by another provider:   None.  Brief History, Exam, Impression, and Recommendations:    Closed bimalleolar fracture of right ankle Now approximate 11 weeks post bimalleolar fracture of the right ankle, doing well, no tenderness, good motion, good strength, return as needed.  Primary osteoarthritis of both knees Has been doing viscosupplementation injections with her pain provider, sounds like Synvisc has not been effective, I have advised to try another steroid injection, if this fails try a different viscosupplementation and if this fails I be happy to try leukocyte poor PRP x3.    ___________________________________________ Gwen Her. Dianah Field, M.D., ABFM., CAQSM. Primary Care and [place] Instructor of [employer] [facility] [employer] of Medicine

## 2021-08-03 NOTE — Assessment & Plan Note (Signed)
Now approximate 11 weeks post bimalleolar fracture of the right ankle, doing well, no tenderness, good motion, good strength, return as needed.

## 2021-08-03 NOTE — Telephone Encounter (Signed)
Patient would like refill on Carafate . Please advise if okay. Order pended. Verified pharmacy

## 2021-08-03 NOTE — Assessment & Plan Note (Signed)
Has been doing viscosupplementation injections with her pain provider, sounds like Synvisc has not been effective, I have advised to try another steroid injection, if this fails try a different viscosupplementation and if this fails I be happy to try leukocyte poor PRP x3.

## 2021-08-14 ENCOUNTER — Other Ambulatory Visit: Payer: Self-pay | Admitting: *Deleted

## 2021-08-14 MED ORDER — RIVAROXABAN 15 MG PO TABS
15.0000 mg | ORAL_TABLET | Freq: Every day | ORAL | 1 refills | Status: DC
Start: 2021-08-14 — End: 2022-01-22

## 2021-08-14 NOTE — Telephone Encounter (Signed)
Xarelto 15mg  paper refill request received. Pt is 80 years old, weight-104.3kg, Crea-1.64 on 02/12/21, last seen by Dr. [name] Found on 07/13/2021, Diagnosis-Afib, CrCl-47.86ml/min; Dose is appropriate based on dosing criteria. Will send in refill to requested pharmacy.

## 2021-08-17 ENCOUNTER — Other Ambulatory Visit: Payer: Self-pay

## 2021-08-17 MED ORDER — ESTROGENS CONJUGATED 0.625 MG PO TABS
0.6250 mg | ORAL_TABLET | ORAL | 0 refills | Status: DC
Start: 2021-08-17 — End: 2021-08-26

## 2021-08-24 DIAGNOSIS — M1712 Unilateral primary osteoarthritis, left knee: Secondary | ICD-10-CM | POA: Diagnosis not present

## 2021-08-24 DIAGNOSIS — M1711 Unilateral primary osteoarthritis, right knee: Secondary | ICD-10-CM | POA: Diagnosis not present

## 2021-08-26 ENCOUNTER — Other Ambulatory Visit: Payer: Self-pay | Admitting: Family Medicine

## 2021-08-26 DIAGNOSIS — G894 Chronic pain syndrome: Secondary | ICD-10-CM | POA: Diagnosis not present

## 2021-08-26 DIAGNOSIS — M170 Bilateral primary osteoarthritis of knee: Secondary | ICD-10-CM | POA: Diagnosis not present

## 2021-08-26 DIAGNOSIS — M47812 Spondylosis without myelopathy or radiculopathy, cervical region: Secondary | ICD-10-CM | POA: Diagnosis not present

## 2021-08-26 DIAGNOSIS — M47817 Spondylosis without myelopathy or radiculopathy, lumbosacral region: Secondary | ICD-10-CM | POA: Diagnosis not present

## 2021-08-26 NOTE — Telephone Encounter (Signed)
Please call pt and advise her that she MUST schedule a f/u for her medication refill on the premarin. Will send 30 day supply

## 2021-08-26 NOTE — Telephone Encounter (Signed)
Follow up scheduled in a 40 min slot

## 2021-08-31 DIAGNOSIS — M1711 Unilateral primary osteoarthritis, right knee: Secondary | ICD-10-CM | POA: Diagnosis not present

## 2021-09-01 ENCOUNTER — Telehealth: Payer: Self-pay | Admitting: *Deleted

## 2021-09-01 NOTE — Telephone Encounter (Signed)
Our office received a fax today requesting pre op clearance. I called and left a message for the requesting office to please call back and clarify the procedure being done, as well as if any anesthesia being used and name of MD doing procedure. Fax came from [facility].

## 2021-09-01 NOTE — Telephone Encounter (Signed)
Clinical pharmacist to review Xarelto 

## 2021-09-01 NOTE — Telephone Encounter (Signed)
Patient with diagnosis of afib on Xarelto for anticoagulation.    Procedure: B/L MEDIAL BRANCH BLOCK Date of procedure: 09/10/21  CHA2DS2-VASc Score = 6  This indicates a 9.7% annual risk of stroke. The patient's score is based upon: CHF History: 1 HTN History: 1 Diabetes History: 0 Stroke History: 0 Vascular Disease History: 1 Age Score: 2 Gender Score: 1   CrCl 75.89 mL/min using adjusted body weight due to obesity Platelet count 169K  Per office protocol, patient should hold Xarelto for 3 days prior to procedure.

## 2021-09-01 NOTE — Telephone Encounter (Signed)
   Name: Nicole Norton  DOB: [birth year]  MRN: [id number]   Primary Cardiologist: Quay Burow, MD  Chart reviewed as part of pre-operative protocol coverage. Patient was contacted 09/01/2021 in reference to pre-operative risk assessment for pending surgery as outlined below.  Nicole Norton was last seen on 06/23/2021 by Dr. [name] Found.  Since that day, Nicole Norton has done well without significant chest pain or worsening dyspnea.  Therefore, based on ACC/AHA guidelines, the patient would be at acceptable risk for the planned procedure without further cardiovascular testing.   He may hold Xarelto for 3 days prior to the procedure and restart as soon as possible afterward at the discretion of the doctor who performed the procedure.   The patient was advised that if she develops new symptoms prior to surgery to contact our office to arrange for a follow-up visit, and she verbalized understanding.  I will route this recommendation to the requesting party via Epic fax function and remove from pre-op pool. Please call with questions.  Almyra Deforest, [place] 09/01/2021, 5:37 PM

## 2021-09-01 NOTE — Telephone Encounter (Signed)
   Pre-operative Risk Assessment    Patient Name: Nicole Norton  DOB: April 23, 1941 MRN: [id number]      Request for Surgical Clearance   Procedure:   B/L MEDIAL BRANCH BLOCK  Date of Surgery: Clearance 09/10/21                                 Surgeon:  DR. MARK PHILLIPS/DR. MARY BODEA Surgeon's Group or Practice Name:  [facility] Phone number:  [phone] Fax number:  [phone]   Type of Clearance Requested: - Medical  - Pharmacy:  Hold Rivaroxaban (Xarelto)     Type of Anesthesia:   Not Indicated   Additional requests/questions:   [name]   09/01/2021, 3:27 PM

## 2021-09-03 ENCOUNTER — Ambulatory Visit: Payer: Medicare Other | Admitting: Family Medicine

## 2021-09-07 DIAGNOSIS — M1711 Unilateral primary osteoarthritis, right knee: Secondary | ICD-10-CM | POA: Diagnosis not present

## 2021-09-10 DIAGNOSIS — M47816 Spondylosis without myelopathy or radiculopathy, lumbar region: Secondary | ICD-10-CM | POA: Diagnosis not present

## 2021-09-24 DIAGNOSIS — M47817 Spondylosis without myelopathy or radiculopathy, lumbosacral region: Secondary | ICD-10-CM | POA: Diagnosis not present

## 2021-09-24 DIAGNOSIS — M170 Bilateral primary osteoarthritis of knee: Secondary | ICD-10-CM | POA: Diagnosis not present

## 2021-09-24 DIAGNOSIS — G894 Chronic pain syndrome: Secondary | ICD-10-CM | POA: Diagnosis not present

## 2021-09-24 DIAGNOSIS — M47812 Spondylosis without myelopathy or radiculopathy, cervical region: Secondary | ICD-10-CM | POA: Diagnosis not present

## 2021-09-29 ENCOUNTER — Encounter: Payer: Self-pay | Admitting: Family Medicine

## 2021-09-29 ENCOUNTER — Other Ambulatory Visit: Payer: Self-pay

## 2021-09-29 ENCOUNTER — Ambulatory Visit (INDEPENDENT_AMBULATORY_CARE_PROVIDER_SITE_OTHER): Payer: Medicare Other | Admitting: Family Medicine

## 2021-09-29 ENCOUNTER — Other Ambulatory Visit: Payer: Self-pay | Admitting: Family Medicine

## 2021-09-29 VITALS — BP 138/70 | HR 56 | Ht 66.0 in | Wt 231.0 lb

## 2021-09-29 DIAGNOSIS — L570 Actinic keratosis: Secondary | ICD-10-CM | POA: Diagnosis not present

## 2021-09-29 DIAGNOSIS — I5022 Chronic systolic (congestive) heart failure: Secondary | ICD-10-CM

## 2021-09-29 DIAGNOSIS — R2232 Localized swelling, mass and lump, left upper limb: Secondary | ICD-10-CM

## 2021-09-29 DIAGNOSIS — I10 Essential (primary) hypertension: Secondary | ICD-10-CM | POA: Diagnosis not present

## 2021-09-29 DIAGNOSIS — E039 Hypothyroidism, unspecified: Secondary | ICD-10-CM | POA: Diagnosis not present

## 2021-09-29 DIAGNOSIS — K219 Gastro-esophageal reflux disease without esophagitis: Secondary | ICD-10-CM

## 2021-09-29 DIAGNOSIS — J8281 Chronic eosinophilic pneumonia: Secondary | ICD-10-CM | POA: Diagnosis not present

## 2021-09-29 DIAGNOSIS — S41111A Laceration without foreign body of right upper arm, initial encounter: Secondary | ICD-10-CM

## 2021-09-29 DIAGNOSIS — Z79890 Hormone replacement therapy (postmenopausal): Secondary | ICD-10-CM | POA: Diagnosis not present

## 2021-09-29 MED ORDER — LANSOPRAZOLE 30 MG PO CPDR: 30.0000 mg | DELAYED_RELEASE_CAPSULE | Freq: Every day | ORAL | 2 refills | Status: DC

## 2021-09-29 MED ORDER — BUMETANIDE 1 MG PO TABS: 1.0000 mg | ORAL_TABLET | Freq: Every day | ORAL | 3 refills | Status: AC

## 2021-09-29 MED ORDER — ESTROGENS CONJUGATED 0.625 MG PO TABS: ORAL_TABLET | ORAL | 3 refills | Status: DC
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
## 2021-09-29 NOTE — Assessment & Plan Note (Signed)
He is still on Nucala.

## 2021-09-29 NOTE — Assessment & Plan Note (Signed)
Actually just had a TSH checked in September and it looked great.  So will not repeat today.

## 2021-09-29 NOTE — Addendum Note (Signed)
Addended by: Beatrice Lecher D on: 09/29/2021 04:56 PM   Modules accepted: Orders

## 2021-09-29 NOTE — Assessment & Plan Note (Signed)
Go ahead and change the bumetanide to 1 mg tab since she is splitting the 2 mg that should make it more efficient and easier if at any point she feels like she is retaining more fluid or swelling then please let us know.

## 2021-09-29 NOTE — Assessment & Plan Note (Signed)
Ahead and refill her PPI.  She feels like it is doing well to control her symptoms and rarely has to take the Carafate.

## 2021-09-29 NOTE — Assessment & Plan Note (Signed)
BP is a little elevated today but she says normally it is well controlled and it looked great when she went to see pain management last week.

## 2021-09-29 NOTE — Assessment & Plan Note (Signed)
Discussed decreasing HRT from 3 days a week to every third day which would mean 3 doses every 9 days instead of every 7 days if she says she is willing to try it for the next 6 to 12 months.  And we can continue to taper as tolerated.  We does discussed risks and for continuing the medication.  She does have a family history of breast cancer.

## 2021-10-16 ENCOUNTER — Telehealth (INDEPENDENT_AMBULATORY_CARE_PROVIDER_SITE_OTHER): Payer: Medicare Other | Admitting: Internal Medicine

## 2021-10-16 ENCOUNTER — Encounter: Payer: Self-pay | Admitting: Internal Medicine

## 2021-10-16 ENCOUNTER — Other Ambulatory Visit: Payer: Self-pay

## 2021-10-16 DIAGNOSIS — I5032 Chronic diastolic (congestive) heart failure: Secondary | ICD-10-CM

## 2021-10-16 DIAGNOSIS — E785 Hyperlipidemia, unspecified: Secondary | ICD-10-CM

## 2021-10-16 DIAGNOSIS — I480 Paroxysmal atrial fibrillation: Secondary | ICD-10-CM

## 2021-10-16 DIAGNOSIS — I10 Essential (primary) hypertension: Secondary | ICD-10-CM

## 2021-10-16 MED ORDER — REPATHA SURECLICK 140 MG/ML ~~LOC~~ SOAJ: 1.0000 | SUBCUTANEOUS | 11 refills | Status: DC

## 2021-10-16 NOTE — Patient Instructions (Signed)
Medication Instructions:  Dr. Debara Pickett recommends Repatha 140mg /mL (PCSK9). This is an injectable cholesterol medication self-administered once every 14 days. This medication will likely need prior approval with your insurance company, which we will work on. If the medication is not approved initially, we may need to do an appeal with your insurance.   Administer medication in area of fatty tissue such as abdomen, outer thigh, back of upper arm - and rotate site with each injection Store medication in refrigerator until ready to administer - allow to sit at room temp for 30 mins - 1 hour prior to injection Dispose of medication in a SHARPS container - your pharmacy should be able to direct you on this and proper disposal   If you need a co-pay card for Repatha: [email or web address]-[email or web address] >> paying for Repatha or red box that says "[place]" in top right If you need a co-pay card for Praluent: WedMap.it >> starting & paying for Praluent  Patient Assistance:   The [employer] Foundation: https://www.panfoundation.org/disease-funds/hypercholesterolemia/ -- can sign up for wait list  The [employer] foundation offers assistance to help pay for medication copays.  They will cover copays for all cholesterol lowering meds, including statins, fibrates, omega-3 fish oils like Vascepa, ezetimibe, Repatha, Praluent, Nexletol, Nexlizet.  The cards are usually good for $2,500 or 12 months, whichever comes first. Go to healthwellfoundation.org Click on "Apply Now" Answer questions as to whom is applying (patient or representative) Your disease fund will be "hypercholesterolemia - Medicare access" They will ask questions about finances and which medications you are taking for cholesterol When you submit, the approval is usually within minutes.  You will need to print the card information from the site You will need to show this information to your pharmacy, they will bill your Medicare Part D plan first -then bill [employer]  Well --for the copay.   You can also call them at [phone], although the hold times can be quite long.     *If you need a refill on your cardiac medications before your next appointment, please call your pharmacy*   Lab Work: FASTING lab work to check cholesterol in 3-4 months -- complete about 1 week before your next visit with Dr. Debara Pickett   If you have labs (blood work) drawn today and your tests are completely normal, you will receive your results only by: [place] (if you have MyChart) OR A paper copy in the mail If you have any lab test that is abnormal or we need to change your treatment, we will call you to review the results.   Testing/Procedures: NONE   Follow-Up: At [facility], you and your health needs are our priority.  As part of our continuing mission to provide you with exceptional heart care, we have created designated Provider Care Teams.  These Care Teams include your primary Cardiologist (physician) and Advanced Practice Providers (APPs -  Physician Assistants and Nurse Practitioners) who all work together to provide you with the care you need, when you need it.  We recommend signing up for the patient portal called "MyChart".  Sign up information is provided on this After Visit Summary.  MyChart is used to connect with patients for Virtual Visits (Telemedicine).  Patients are able to view lab/test results, encounter notes, upcoming appointments, etc.  Non-urgent messages can be sent to your provider as well.   To learn more about what you can do with MyChart, go to [email or web address].    Your next appointment:   3-4 month(s)  The format for your next appointment:   In Person or Virtual  Provider:   Dr. Lyman Bishop

## 2021-10-16 NOTE — Addendum Note (Signed)
Addended by: Fidel Levy on: 10/16/2021 03:14 PM   Modules accepted: Orders
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
## 2021-10-19 ENCOUNTER — Ambulatory Visit (INDEPENDENT_AMBULATORY_CARE_PROVIDER_SITE_OTHER): Payer: Medicare Other

## 2021-10-19 ENCOUNTER — Other Ambulatory Visit: Payer: Self-pay

## 2021-10-19 ENCOUNTER — Other Ambulatory Visit: Payer: Self-pay | Admitting: Family Medicine

## 2021-10-19 DIAGNOSIS — R2232 Localized swelling, mass and lump, left upper limb: Secondary | ICD-10-CM

## 2021-10-19 DIAGNOSIS — L989 Disorder of the skin and subcutaneous tissue, unspecified: Secondary | ICD-10-CM | POA: Diagnosis not present

## 2021-10-21 NOTE — Progress Notes (Signed)
Hi Nicole Norton, the skin lesions are most consistent with sebaceous cysts.  This is basically an abnormal poor does not function well and the dead skin and oil does not come to the surface and just get washed it off it actually gets trapped in the port itself.  The good news is that these are not harmful.  If they are bothersome they can be removed.

## 2021-10-22 DIAGNOSIS — M47812 Spondylosis without myelopathy or radiculopathy, cervical region: Secondary | ICD-10-CM | POA: Diagnosis not present

## 2021-10-22 DIAGNOSIS — G894 Chronic pain syndrome: Secondary | ICD-10-CM | POA: Diagnosis not present

## 2021-10-22 DIAGNOSIS — M47817 Spondylosis without myelopathy or radiculopathy, lumbosacral region: Secondary | ICD-10-CM | POA: Diagnosis not present

## 2021-10-22 DIAGNOSIS — Z20822 Contact with and (suspected) exposure to covid-19: Secondary | ICD-10-CM | POA: Diagnosis not present

## 2021-10-22 DIAGNOSIS — M170 Bilateral primary osteoarthritis of knee: Secondary | ICD-10-CM | POA: Diagnosis not present

## 2021-10-23 ENCOUNTER — Telehealth: Payer: Self-pay | Admitting: Internal Medicine

## 2021-10-23 NOTE — Telephone Encounter (Signed)
PA for repatha submitted via CMM (Key: [id number])  May also need to call insurance ([employer]) @ [id number]

## 2021-10-26 DIAGNOSIS — M25532 Pain in left wrist: Secondary | ICD-10-CM | POA: Diagnosis not present

## 2021-10-26 NOTE — Telephone Encounter (Signed)
CaseId:[id number];Status:Approved;Review Type:Prior Auth;Coverage Start Date:09/23/2021;Coverage End Date:10/23/2022;

## 2021-10-26 NOTE — Telephone Encounter (Signed)
Patient aware Repatha is approved and Rx has been sent to pharmacy  Advised that she call pharmacy to get med filled Advised about [employer] grant Scheduled for VV on 02/02/22 @ 930am Lab slips mailed

## 2021-11-13 DIAGNOSIS — M25532 Pain in left wrist: Secondary | ICD-10-CM | POA: Diagnosis not present

## 2021-11-23 DIAGNOSIS — M47817 Spondylosis without myelopathy or radiculopathy, lumbosacral region: Secondary | ICD-10-CM | POA: Diagnosis not present

## 2021-11-23 DIAGNOSIS — M170 Bilateral primary osteoarthritis of knee: Secondary | ICD-10-CM | POA: Diagnosis not present

## 2021-11-23 DIAGNOSIS — M47812 Spondylosis without myelopathy or radiculopathy, cervical region: Secondary | ICD-10-CM | POA: Diagnosis not present

## 2021-11-23 DIAGNOSIS — G894 Chronic pain syndrome: Secondary | ICD-10-CM | POA: Diagnosis not present

## 2021-11-24 ENCOUNTER — Other Ambulatory Visit: Payer: Self-pay | Admitting: Family Medicine

## 2021-11-24 DIAGNOSIS — Z1231 Encounter for screening mammogram for malignant neoplasm of breast: Secondary | ICD-10-CM

## 2021-12-07 DIAGNOSIS — M25532 Pain in left wrist: Secondary | ICD-10-CM | POA: Diagnosis not present

## 2021-12-09 ENCOUNTER — Other Ambulatory Visit: Payer: Self-pay | Admitting: Cardiovascular Disease

## 2021-12-12 ENCOUNTER — Other Ambulatory Visit: Payer: Self-pay | Admitting: Family Medicine

## 2021-12-16 ENCOUNTER — Other Ambulatory Visit: Payer: Self-pay

## 2021-12-16 ENCOUNTER — Telehealth: Payer: Self-pay

## 2021-12-16 ENCOUNTER — Ambulatory Visit
Admission: RE | Admit: 2021-12-16 | Discharge: 2021-12-16 | Disposition: A | Discharge status: Home / Self Care | Payer: Medicare Other | Source: Ambulatory Visit | Attending: Family Medicine | Admitting: Family Medicine

## 2021-12-16 DIAGNOSIS — Z1231 Encounter for screening mammogram for malignant neoplasm of breast: Secondary | ICD-10-CM

## 2021-12-16 NOTE — Telephone Encounter (Signed)
Contacted patient she is aware to repeat in 1 year.

## 2021-12-16 NOTE — Progress Notes (Signed)
Please call patient. Normal mammogram.  Repeat in 1 year.  

## 2021-12-31 DIAGNOSIS — G894 Chronic pain syndrome: Secondary | ICD-10-CM | POA: Diagnosis not present

## 2021-12-31 DIAGNOSIS — M47812 Spondylosis without myelopathy or radiculopathy, cervical region: Secondary | ICD-10-CM | POA: Diagnosis not present

## 2021-12-31 DIAGNOSIS — M47817 Spondylosis without myelopathy or radiculopathy, lumbosacral region: Secondary | ICD-10-CM | POA: Diagnosis not present

## 2021-12-31 DIAGNOSIS — M170 Bilateral primary osteoarthritis of knee: Secondary | ICD-10-CM | POA: Diagnosis not present

## 2021-12-31 DIAGNOSIS — Z79891 Long term (current) use of opiate analgesic: Secondary | ICD-10-CM | POA: Diagnosis not present

## 2022-01-11 DIAGNOSIS — Z20822 Contact with and (suspected) exposure to covid-19: Secondary | ICD-10-CM | POA: Diagnosis not present

## 2022-01-21 ENCOUNTER — Other Ambulatory Visit: Payer: Self-pay | Admitting: Cardiovascular Disease

## 2022-01-21 DIAGNOSIS — Z20828 Contact with and (suspected) exposure to other viral communicable diseases: Secondary | ICD-10-CM | POA: Diagnosis not present

## 2022-01-22 ENCOUNTER — Other Ambulatory Visit: Payer: Self-pay | Admitting: *Deleted

## 2022-01-22 MED ORDER — LEVOTHYROXINE SODIUM 200 MCG PO TABS: 200.0000 ug | ORAL_TABLET | Freq: Every day | ORAL | 1 refills | Status: AC

## 2022-01-22 NOTE — Telephone Encounter (Signed)
Prescription refill request for Xarelto received.  ?Indication:Afib ?Last office visit:12/22 ?Weight:104.8 kg ?Age:81 ?Scr:1.6 ?CrCl:47.94 ml/min ? ?Prescription refilled ? ?

## 2022-01-28 DIAGNOSIS — M47817 Spondylosis without myelopathy or radiculopathy, lumbosacral region: Secondary | ICD-10-CM | POA: Diagnosis not present

## 2022-01-28 DIAGNOSIS — M170 Bilateral primary osteoarthritis of knee: Secondary | ICD-10-CM | POA: Diagnosis not present

## 2022-01-28 DIAGNOSIS — M47812 Spondylosis without myelopathy or radiculopathy, cervical region: Secondary | ICD-10-CM | POA: Diagnosis not present

## 2022-01-28 DIAGNOSIS — G894 Chronic pain syndrome: Secondary | ICD-10-CM | POA: Diagnosis not present

## 2022-02-02 ENCOUNTER — Telehealth (INDEPENDENT_AMBULATORY_CARE_PROVIDER_SITE_OTHER): Payer: Medicare Other | Admitting: Internal Medicine

## 2022-02-02 ENCOUNTER — Ambulatory Visit (INDEPENDENT_AMBULATORY_CARE_PROVIDER_SITE_OTHER): Payer: Medicare Other

## 2022-02-02 ENCOUNTER — Other Ambulatory Visit: Payer: Self-pay

## 2022-02-02 ENCOUNTER — Encounter: Payer: Self-pay | Admitting: Internal Medicine

## 2022-02-02 ENCOUNTER — Ambulatory Visit (INDEPENDENT_AMBULATORY_CARE_PROVIDER_SITE_OTHER): Payer: Medicare Other | Admitting: Sports Medicine

## 2022-02-02 VITALS — BP 128/81 | HR 65 | Wt 230.0 lb

## 2022-02-02 DIAGNOSIS — I2510 Atherosclerotic heart disease of native coronary artery without angina pectoris: Secondary | ICD-10-CM

## 2022-02-02 DIAGNOSIS — M7989 Other specified soft tissue disorders: Secondary | ICD-10-CM | POA: Diagnosis not present

## 2022-02-02 DIAGNOSIS — S8002XA Contusion of left knee, initial encounter: Secondary | ICD-10-CM

## 2022-02-02 DIAGNOSIS — M542 Cervicalgia: Secondary | ICD-10-CM | POA: Diagnosis not present

## 2022-02-02 DIAGNOSIS — M79604 Pain in right leg: Secondary | ICD-10-CM

## 2022-02-02 DIAGNOSIS — W19XXXA Unspecified fall, initial encounter: Secondary | ICD-10-CM | POA: Diagnosis not present

## 2022-02-02 DIAGNOSIS — M79671 Pain in right foot: Secondary | ICD-10-CM

## 2022-02-02 DIAGNOSIS — S8990XA Unspecified injury of unspecified lower leg, initial encounter: Secondary | ICD-10-CM

## 2022-02-02 DIAGNOSIS — M25571 Pain in right ankle and joints of right foot: Secondary | ICD-10-CM

## 2022-02-02 DIAGNOSIS — E785 Hyperlipidemia, unspecified: Secondary | ICD-10-CM

## 2022-02-02 DIAGNOSIS — M1712 Unilateral primary osteoarthritis, left knee: Secondary | ICD-10-CM | POA: Diagnosis not present

## 2022-02-02 NOTE — Patient Instructions (Signed)
Medication Instructions:  ? ?Nicole Norton is a subcutaneous injection that will lower your LDL cholesterol by 50%. The injections are given by a healthcare provider at [facility]. The dosing schedule is dose 1 and then dose 2 three months later and then every 6 months after.  ? ?If you are interested in pursuing this, we will check your insurance benefits for coverage, as this goes thru medical benefits instead of part D drug coverage benefits. [employer] tends to have good coverage for this medication.  ? ?*If you need a refill on your cardiac medications before your next appointment, please call your pharmacy* ? ? ? ?Follow-Up: ?At [facility], you and your health needs are our priority.  As part of our continuing mission to provide you with exceptional heart care, we have created designated Provider Care Teams.  These Care Teams include your primary Cardiologist (physician) and Advanced Practice Providers (APPs -  Physician Assistants and Nurse Practitioners) who all work together to provide you with the care you need, when you need it. ? ?We recommend signing up for the patient portal called "MyChart".  Sign up information is provided on this After Visit Summary.  MyChart is used to connect with patients for Virtual Visits (Telemedicine).  Patients are able to view lab/test results, encounter notes, upcoming appointments, etc.  Non-urgent messages can be sent to your provider as well.   ?To learn more about what you can do with MyChart, go to [email or web address].   ? ?Your next appointment:   ? ?AS NEEDED with Dr. Debara Pickett  ? ? ? ?

## 2022-02-02 NOTE — Progress Notes (Signed)
? ? ?  Procedures performed today:   ? ?None. ? ?Independent interpretation of notes and tests performed by another provider:  ? ?None. ? ?Brief History, Exam, Impression, and Recommendations:   ? ?Injury of left knee, right foot and ankle ?This is a fairly debilitated 81 year old female, she had a fall on Friday, impacted her left knee, right foot and ankle. ?Left knee is bruised with an overlying blister, she has good motion and good strength. ?Right foot and ankle are bruised and swollen with tenderness at the malleolar eye and over the dorsal midfoot. ?We will get right tib-fib, ankle, and foot x-rays as well as a left knee x-ray. ?She is already treated by pain clinic and has fentanyl on board so her pain is not that bad right now. ? ?Of note, she does have a history of bimalleolar fracture on the right approximately 6 to 8 months ago that healed well. ? ? ? ?___________________________________________ ?Gwen Her. Dianah Field, M.D., ABFM., CAQSM. ?Primary Care and Sports Medicine ?[place] ? ?Adjunct Instructor of Family Medicine  ?[employer] ?

## 2022-02-02 NOTE — Assessment & Plan Note (Addendum)
This is a fairly debilitated 81 year old female, she had a fall on Friday, impacted her left knee, right foot and ankle. ?Left knee is bruised with an overlying blister, she has good motion and good strength. ?Right foot and ankle are bruised and swollen with tenderness at the malleolar eye and over the dorsal midfoot. ?We will get right tib-fib, ankle, and foot x-rays as well as a left knee x-ray. ?She is already treated by pain clinic and has fentanyl on board so her pain is not that bad right now. ? ?Of note, she does have a history of bimalleolar fracture on the right approximately 6 to 8 months ago that healed well. ?
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
## 2022-02-03 DIAGNOSIS — Z79890 Hormone replacement therapy (postmenopausal): Secondary | ICD-10-CM | POA: Diagnosis not present

## 2022-02-03 DIAGNOSIS — I10 Essential (primary) hypertension: Secondary | ICD-10-CM | POA: Diagnosis not present

## 2022-02-03 DIAGNOSIS — I5022 Chronic systolic (congestive) heart failure: Secondary | ICD-10-CM | POA: Diagnosis not present

## 2022-02-04 LAB — LIPID PANEL W/REFLEX DIRECT LDL
Cholesterol: 252 mg/dL — ABNORMAL HIGH (ref <200)
HDL: 71 mg/dL (ref >=50)
LDL Cholesterol (Calc): 137 mg/dL (calc) — ABNORMAL HIGH
Non-HDL Cholesterol (Calc): 181 mg/dL (calc) — ABNORMAL HIGH (ref <130)
Total CHOL/HDL Ratio: 3.5 (calc) (ref <5.0)
Triglycerides: 288 mg/dL — ABNORMAL HIGH (ref <150)

## 2022-02-04 LAB — BASIC METABOLIC PANEL WITH GFR
BUN/Creatinine Ratio: 20 (calc) (ref 6–22)
BUN: 26 mg/dL — ABNORMAL HIGH (ref 7–25)
CO2: 33 mmol/L — ABNORMAL HIGH (ref 20–32)
Calcium: 9.2 mg/dL (ref 8.6–10.4)
Chloride: 101 mmol/L (ref 98–110)
Creat: 1.28 mg/dL — ABNORMAL HIGH (ref 0.60–1.00)
Glucose, Bld: 98 mg/dL (ref 65–99)
Potassium: 4.5 mmol/L (ref 3.5–5.3)
Sodium: 144 mmol/L (ref 135–146)
eGFR: 43 mL/min/{1.73_m2} — ABNORMAL LOW (ref >=60)

## 2022-02-11 DIAGNOSIS — Z20822 Contact with and (suspected) exposure to covid-19: Secondary | ICD-10-CM | POA: Diagnosis not present

## 2022-02-18 DIAGNOSIS — Z20822 Contact with and (suspected) exposure to covid-19: Secondary | ICD-10-CM | POA: Diagnosis not present

## 2022-02-23 ENCOUNTER — Telehealth: Payer: Self-pay | Admitting: Cardiology

## 2022-02-23 NOTE — Telephone Encounter (Signed)
Pt states that she has been experience bone pain, leg pain. Please advise ?

## 2022-02-23 NOTE — Telephone Encounter (Signed)
Pt has several concerns: ?She is unable to take NSAIDS that her pain doctor is recommending because of the Xarelto.  She wondered if there was any other alternatives we knew of.  She is already using Voltaren Gel and several other agents for pain management. ? ?Pt fell 4 weeks ago.  Denies hitting head.  Today c/o new right inner thigh pain that feels like it is bleeding.  It is tender to touch.  No raised or bruised area.  I asked her to call her PCP to discuss this. ? ?Pt reports being back in afib and is feeling fatigued and SOB.  She has not seen her HR above 80.  She would like a visit to discuss.  Reports history of afib ablation and cardioversions.  I scheduled her with afib clinic next Tue and she is pleased with this plan. ?

## 2022-02-24 DIAGNOSIS — G894 Chronic pain syndrome: Secondary | ICD-10-CM | POA: Diagnosis not present

## 2022-02-24 DIAGNOSIS — M170 Bilateral primary osteoarthritis of knee: Secondary | ICD-10-CM | POA: Diagnosis not present

## 2022-02-24 DIAGNOSIS — M47812 Spondylosis without myelopathy or radiculopathy, cervical region: Secondary | ICD-10-CM | POA: Diagnosis not present

## 2022-02-24 DIAGNOSIS — M47817 Spondylosis without myelopathy or radiculopathy, lumbosacral region: Secondary | ICD-10-CM | POA: Diagnosis not present

## 2022-02-25 ENCOUNTER — Encounter: Payer: Self-pay | Admitting: Family Medicine

## 2022-02-25 ENCOUNTER — Ambulatory Visit (INDEPENDENT_AMBULATORY_CARE_PROVIDER_SITE_OTHER): Payer: Medicare Other

## 2022-02-25 ENCOUNTER — Ambulatory Visit (INDEPENDENT_AMBULATORY_CARE_PROVIDER_SITE_OTHER): Payer: Medicare Other | Admitting: Family Medicine

## 2022-02-25 VITALS — BP 138/78 | HR 80 | Ht 66.0 in | Wt 240.0 lb

## 2022-02-25 DIAGNOSIS — M7989 Other specified soft tissue disorders: Secondary | ICD-10-CM

## 2022-02-25 DIAGNOSIS — M79604 Pain in right leg: Secondary | ICD-10-CM

## 2022-02-25 DIAGNOSIS — M7121 Synovial cyst of popliteal space [Baker], right knee: Secondary | ICD-10-CM | POA: Diagnosis not present

## 2022-02-25 DIAGNOSIS — R600 Localized edema: Secondary | ICD-10-CM | POA: Diagnosis not present

## 2022-02-25 DIAGNOSIS — I2510 Atherosclerotic heart disease of native coronary artery without angina pectoris: Secondary | ICD-10-CM

## 2022-02-25 MED ORDER — DOXYCYCLINE HYCLATE 100 MG PO TABS: 100.0000 mg | ORAL_TABLET | Freq: Two times a day (BID) | ORAL | 0 refills | Status: DC

## 2022-02-25 NOTE — Addendum Note (Signed)
Addended by: Beatrice Lecher D on: 02/25/2022 05:32 PM ? ? Modules accepted: Orders ? ?

## 2022-02-25 NOTE — Progress Notes (Signed)
Scription initially sent to [facility] but they closed at 6:00 so I resent to [facility].
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
## 2022-02-25 NOTE — Progress Notes (Signed)
Pt reports that she fell last month. She now has swelling in her R leg, shooting pain in the inner thigh that feels as if someone is cutting her with a knife, she has a knot on her R foot, she stated that the pain got worse 2 days ago/ ?

## 2022-03-01 DIAGNOSIS — Z20822 Contact with and (suspected) exposure to covid-19: Secondary | ICD-10-CM | POA: Diagnosis not present

## 2022-03-02 ENCOUNTER — Encounter (HOSPITAL_COMMUNITY): Payer: Self-pay | Admitting: Physician Assistant

## 2022-03-02 ENCOUNTER — Ambulatory Visit (HOSPITAL_COMMUNITY)
Admission: RE | Admit: 2022-03-02 | Discharge: 2022-03-02 | Disposition: A | Discharge status: Home / Self Care | Payer: Medicare Other | Source: Ambulatory Visit | Attending: Physician Assistant | Admitting: Physician Assistant

## 2022-03-02 ENCOUNTER — Ambulatory Visit (INDEPENDENT_AMBULATORY_CARE_PROVIDER_SITE_OTHER): Payer: Medicare Other | Admitting: Sports Medicine

## 2022-03-02 VITALS — BP 138/88 | HR 68 | Ht 66.0 in | Wt 239.0 lb

## 2022-03-02 DIAGNOSIS — I5042 Chronic combined systolic (congestive) and diastolic (congestive) heart failure: Secondary | ICD-10-CM | POA: Insufficient documentation

## 2022-03-02 DIAGNOSIS — Z79899 Other long term (current) drug therapy: Secondary | ICD-10-CM | POA: Diagnosis not present

## 2022-03-02 DIAGNOSIS — I872 Venous insufficiency (chronic) (peripheral): Secondary | ICD-10-CM | POA: Diagnosis not present

## 2022-03-02 DIAGNOSIS — I110 Hypertensive heart disease with heart failure: Secondary | ICD-10-CM | POA: Diagnosis not present

## 2022-03-02 DIAGNOSIS — Z7901 Long term (current) use of anticoagulants: Secondary | ICD-10-CM | POA: Insufficient documentation

## 2022-03-02 DIAGNOSIS — I2510 Atherosclerotic heart disease of native coronary artery without angina pectoris: Secondary | ICD-10-CM

## 2022-03-02 DIAGNOSIS — D6869 Other thrombophilia: Secondary | ICD-10-CM

## 2022-03-02 DIAGNOSIS — I4819 Other persistent atrial fibrillation: Secondary | ICD-10-CM

## 2022-03-02 DIAGNOSIS — I4892 Unspecified atrial flutter: Secondary | ICD-10-CM | POA: Diagnosis not present

## 2022-03-02 MED ORDER — SULFAMETHOXAZOLE-TRIMETHOPRIM 800-160 MG PO TABS: 1.0000 | ORAL_TABLET | Freq: Two times a day (BID) | ORAL | 0 refills | Status: DC
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
## 2022-03-02 NOTE — Assessment & Plan Note (Signed)
Bilateral right worse than left venous stasis dermatitis with ulceration, currently on doxycycline, still has a bit of cellulitic changes, adding a course of Septra, I also placed an Haematologist, she will return weekly for [employer] changes. ?Additional follow-ups need to be with PCP for this. ? ?

## 2022-03-02 NOTE — Progress Notes (Signed)
? ? ?  Procedures performed today:   ? ?Unna boot placed right lower extremity ? ?Independent interpretation of notes and tests performed by another provider:  ? ?None. ? ?Brief History, Exam, Impression, and Recommendations:   ? ?Venous stasis dermatitis of both lower extremities ?Bilateral right worse than left venous stasis dermatitis with ulceration, currently on doxycycline, still has a bit of cellulitic changes, adding a course of Septra, I also placed an Haematologist, she will return weekly for [employer] changes. ?Additional follow-ups need to be with PCP for this. ? ? ? ?___________________________________________ ?Gwen Her. Dianah Field, M.D., ABFM., CAQSM. ?Primary Care and Sports Medicine ?[place] ? ?Adjunct Instructor of Family Medicine  ?[employer] ?

## 2022-03-09 ENCOUNTER — Ambulatory Visit (INDEPENDENT_AMBULATORY_CARE_PROVIDER_SITE_OTHER): Payer: Medicare Other | Admitting: Family Medicine

## 2022-03-09 ENCOUNTER — Encounter: Payer: Self-pay | Admitting: Family Medicine

## 2022-03-09 VITALS — Ht 66.0 in | Wt 234.0 lb

## 2022-03-09 DIAGNOSIS — I2510 Atherosclerotic heart disease of native coronary artery without angina pectoris: Secondary | ICD-10-CM

## 2022-03-09 DIAGNOSIS — Z20822 Contact with and (suspected) exposure to covid-19: Secondary | ICD-10-CM | POA: Diagnosis not present

## 2022-03-09 DIAGNOSIS — S8990XA Unspecified injury of unspecified lower leg, initial encounter: Secondary | ICD-10-CM

## 2022-03-09 DIAGNOSIS — I872 Venous insufficiency (chronic) (peripheral): Secondary | ICD-10-CM | POA: Diagnosis not present

## 2022-03-09 NOTE — Progress Notes (Signed)
Pt presented for unna boot change of right leg. Dr. [name] viewed the leg and treatment plan.  ? ?Cleaned, sanitized, and re-wrapped right leg. Pt to return on Friday. ?

## 2022-03-09 NOTE — Progress Notes (Signed)
? ?  Acute Office Visit ? ?Subjective:  ? ?  ?Patient ID: Nicole Norton, female    DOB: 16-Mar-1941, 81 y.o.   MRN: [id number] ? ?Chief Complaint  ?Patient presents with  ? [name]  ? ? ?HPI ?Patient is in today for f/u of unna boot wrapping. She was seen by one of my partners on 4/18 and was having still severe swelling and erythema of the right lower leg.  She also had some swelling and draining on the top of the right foot.  She says it has mostly had serous drainage but no pus or odor.  She does feel like the lower leg looks much much better but she still has a fair amount of swelling on the top of the foot and that is mostly where it is draining currently.  The Unna boot did go down to the distal foot.  No Fever ? ?ROS ? ? ?   ?Objective:  ?  ?Ht '5\' 6"'$  (1.676 m)   Wt 234 lb (106.1 kg)   BMI 37.77 kg/m?  ? ? ?Physical Exam ?Vitals reviewed.  ?Constitutional:   ?   Appearance: She is well-developed.  ?HENT:  ?   Head: Normocephalic and atraumatic.  ?Eyes:  ?   Conjunctiva/sclera: Conjunctivae normal.  ?Cardiovascular:  ?   Rate and Rhythm: Normal rate.  ?Pulmonary:  ?   Effort: Pulmonary effort is normal.  ?Skin: ?   General: Skin is dry.  ?   Coloration: Skin is not pale.  ?   Comments: Skin on the right lower leg looks fantastic there is actually really almost no swelling in the lower leg a little bit in the upper thigh but she does have significant swelling on the dorsum of the foot with a small scab in the center.  I am able to compress serous drainage from the area nontender on exam.  There is a fair amount of skin that is dead and peeling.  Iris scissors were used to remove the dead skin.  ?Neurological:  ?   Mental Status: She is alert and oriented to person, place, and time.  ?Psychiatric:     ?   Behavior: Behavior normal.  ? ? ?No results found for any visits on 03/09/22. ? ? ?   ?Assessment & Plan:  ? ?Problem List Items Addressed This Visit   ? ?  ? Musculoskeletal and Integument  ? Venous stasis  dermatitis of both lower extremities - Primary  ?  ? Other  ? Injury of left knee, right foot and ankle  ? ?Venous stasis dermatitis of both lower extremities-she does still have significant swelling on the top of the right foot but she is feels like it looks better than it did previously.  I did use iris scissors to remove some of the dead skin we cleaned the area well with Hibiclens and then rinsed and wrapped again.  Follow-up in 3 days to recheck before the weekend just to make sure that it is getting better.  And then we can continue with the boot and boots at that point.  The leg itself looks fantastic today. ? ? ?No orders of the defined types were placed in this encounter. ? ? ?Return for Friday, 4/28. ? ?Beatrice Lecher, MD ? ? ?

## 2022-03-12 ENCOUNTER — Ambulatory Visit (INDEPENDENT_AMBULATORY_CARE_PROVIDER_SITE_OTHER): Payer: Medicare Other | Admitting: Family Medicine

## 2022-03-12 VITALS — BP 119/74 | HR 80

## 2022-03-12 DIAGNOSIS — I2510 Atherosclerotic heart disease of native coronary artery without angina pectoris: Secondary | ICD-10-CM | POA: Diagnosis not present

## 2022-03-12 DIAGNOSIS — I872 Venous insufficiency (chronic) (peripheral): Secondary | ICD-10-CM | POA: Diagnosis not present

## 2022-03-12 DIAGNOSIS — R110 Nausea: Secondary | ICD-10-CM | POA: Diagnosis not present

## 2022-03-12 DIAGNOSIS — M79672 Pain in left foot: Secondary | ICD-10-CM

## 2022-03-12 DIAGNOSIS — W19XXXA Unspecified fall, initial encounter: Secondary | ICD-10-CM

## 2022-03-12 DIAGNOSIS — S8990XA Unspecified injury of unspecified lower leg, initial encounter: Secondary | ICD-10-CM

## 2022-03-12 MED ORDER — NYSTATIN 100000 UNIT/GM EX POWD: 1.0000 "application " | Freq: Three times a day (TID) | CUTANEOUS | 99 refills | Status: AC

## 2022-03-12 MED ORDER — NYSTATIN 100000 UNIT/GM EX OINT: 1.0000 "application " | TOPICAL_OINTMENT | Freq: Two times a day (BID) | CUTANEOUS | 99 refills | Status: AC

## 2022-03-12 MED ORDER — ONDANSETRON HCL 4 MG PO TABS: 4.0000 mg | ORAL_TABLET | Freq: Three times a day (TID) | ORAL | 0 refills | Status: DC | PRN

## 2022-03-12 NOTE — Progress Notes (Signed)
Pt here to have venous stasis dermatitis check and have Unna boot removed and replaced.   ? ?She informed me that she did fall this week. She was wearing socks that the rubber bottoms were not really good and caused her to slip and fall.  ? ?She did not hit her head.  ? ?Pt to RTC in 1 week to check for venous stasis dermatitis  ?

## 2022-03-12 NOTE — Progress Notes (Signed)
? ?Acute Office Visit ? ?Subjective:  ? ?  ?Patient ID: Nicole Norton, female    DOB: 1940/12/31, 81 y.o.   MRN: [id number] ? ?No chief complaint on file. ? ? ?HPI ?Patient is in today for  ? ?Follow-up wound care on the top of her right foot.  She initially had some open wounds on her right lower leg and was wrapped with an Haematologist.  That has healed but she was still doing well with a significant mount of swelling and open wound on the top of the right foot.  She is here today for follow-up we can make sure that it is improving with her current care regimen.  She is still seeing a little fresh blood at the wound.  No worsening symptoms and no new lesions on the leg. ? ?He did let me know that she fell yesterday she said her sock caught on the rug and she went forward she said it was a pretty gentle fall.  But she did injure the top of her left foot.  No skin breaks or open wounds she has been able to walk on it since then so she does not think she fractured anything. ? ?Would also like a prescription for nystatin cream she is using her husband's old cream and says it has been helpful for the yeasty areas that she gets underneath her breasts. ? ?So getting a little nauseated from the antibiotic that she is currently taking. ? ?ROS ? ? ?   ?Objective:  ?  ?BP 119/74   Pulse 80   SpO2 93%  ? ? ?Physical Exam ?Vitals reviewed.  ?Constitutional:   ?   Appearance: She is well-developed.  ?HENT:  ?   Head: Normocephalic and atraumatic.  ?Eyes:  ?   Conjunctiva/sclera: Conjunctivae normal.  ?Cardiovascular:  ?   Rate and Rhythm: Normal rate.  ?Pulmonary:  ?   Effort: Pulmonary effort is normal.  ?Skin: ?   General: Skin is dry.  ?   Coloration: Skin is not pale.  ?Neurological:  ?   Mental Status: She is alert and oriented to person, place, and time.  ?Psychiatric:     ?   Behavior: Behavior normal.  ? ? ?No results found for any visits on 03/12/22. ? ? ?   ?Assessment & Plan:  ? ?Problem List Items Addressed This  Visit   ? ?  ? Musculoskeletal and Integument  ? Venous stasis dermatitis of both lower extremities  ?  ? Other  ? Injury of left knee, right foot and ankle  ? ?Other Visit Diagnoses   ? ? Nausea    -  Primary  ? Left foot pain      ? Fall, initial encounter      ? ?  ? ?Swelling and wound on the top of the right foot looks much better today there is still a fair amount of swelling there and she does have a little fresh blood at the open wound.  Xeroform placed on top and a repeat Unna boot placed.  Follow-up in 1 week I am hoping at that point everything is healed and we will hopefully be able to discontinue the Unna boots. ? ?Nausea-small amount of Zofran sent to the pharmacy as needed while she is continuing to take her antibiotics.  I also think she would benefit from taking a probiotic either an over-the-counter capsule or yogurt and we discussed options. ? ?Left knee pain and swelling-she would also  like an Ace wrap for her left knee.  So that was provided today as well. ? ?Left foot pain, status post fall-she does have some swelling and bruising on the top of the left foot.  It is mildly tender but no pinpoint tenderness.  We discussed that if it does not continue to improve over the next couple of weeks with conservative care to please let me know and we will consider x-ray.  But I do not think at this point there is any indication that she has fractured her foot. ? ?Meds ordered this encounter  ?Medications  ? nystatin (MYCOSTATIN/NYSTOP) powder  ?  Sig: Apply 1 application. topically 3 (three) times daily.  ?  Dispense:  60 g  ?  Refill:  PRN  ? ondansetron (ZOFRAN) 4 MG tablet  ?  Sig: Take 1 tablet (4 mg total) by mouth every 8 (eight) hours as needed for nausea or vomiting.  ?  Dispense:  20 tablet  ?  Refill:  0  ? nystatin ointment (MYCOSTATIN)  ?  Sig: Apply 1 application. topically 2 (two) times daily.  ?  Dispense:  30 g  ?  Refill:  PRN  ? ? ?Return in about 1 week (around 03/19/2022) for venous  stasis/unna boot. ? ?Beatrice Lecher, MD ? ? ?

## 2022-03-16 ENCOUNTER — Telehealth: Payer: Self-pay

## 2022-03-16 MED ORDER — PREDNISONE 20 MG PO TABS: ORAL_TABLET | ORAL | 0 refills | Status: AC

## 2022-03-16 NOTE — Telephone Encounter (Signed)
Task completed. Left a detailed vm msg for patient regarding rx sent to the pharmacy. Patient informed to contact the clinic if gout symptoms do not improve while taking the rx. Direct call back info provided.  ?

## 2022-03-16 NOTE — Telephone Encounter (Signed)
Patient called stating she has a gout flare up in her feet/toes since taking the Bactrim. Patient requesting for medication to help with the gout flare up. Patient has appointment scheduled 03/19/22. Forward to Dr. [name].  ?

## 2022-03-16 NOTE — Telephone Encounter (Signed)
Meds ordered this encounter  ?Medications  ? predniSONE (DELTASONE) 20 MG tablet  ?  Sig: Take 2 tablets (40 mg total) by mouth daily with breakfast for 4 days, THEN 1 tablet (20 mg total) daily with breakfast for 4 days.  ?  Dispense:  12 tablet  ?  Refill:  0  ? ? ?

## 2022-03-19 ENCOUNTER — Ambulatory Visit (INDEPENDENT_AMBULATORY_CARE_PROVIDER_SITE_OTHER): Payer: Medicare Other | Admitting: Family Medicine

## 2022-03-19 VITALS — BP 138/82 | HR 78

## 2022-03-19 DIAGNOSIS — I2510 Atherosclerotic heart disease of native coronary artery without angina pectoris: Secondary | ICD-10-CM

## 2022-03-19 DIAGNOSIS — I872 Venous insufficiency (chronic) (peripheral): Secondary | ICD-10-CM | POA: Diagnosis not present

## 2022-03-19 DIAGNOSIS — S91302A Unspecified open wound, left foot, initial encounter: Secondary | ICD-10-CM | POA: Diagnosis not present

## 2022-03-19 DIAGNOSIS — S8990XA Unspecified injury of unspecified lower leg, initial encounter: Secondary | ICD-10-CM | POA: Diagnosis not present

## 2022-03-19 NOTE — Progress Notes (Signed)
She is here today for follow-up wound care.  Unna boot removed.  She does feel like she is doing much better.  She did complete the antibiotics but says it really flared up her gout in her hands and her feet so she is glad to be off of it.  She does feel like the wound is looking better and her swelling is improving as well.  We cleaned the area placed Xeroform over the open wound and then placed another open Unna boot.  She still has about a half a centimeter open wound on the top of the foot that is just draining a little bit of blood.  Will follow back up on Friday she can take it off at home and take a shower and remove some of the dead skin and then come in and will take a look.  She is actually leaving town next week for the beach so hopefully if it is looking great we may not have to rewrap it but if not we can wrap it again next Friday. ? ? ?

## 2022-03-22 DIAGNOSIS — Z20822 Contact with and (suspected) exposure to covid-19: Secondary | ICD-10-CM | POA: Diagnosis not present

## 2022-03-24 DIAGNOSIS — M47812 Spondylosis without myelopathy or radiculopathy, cervical region: Secondary | ICD-10-CM | POA: Diagnosis not present

## 2022-03-24 DIAGNOSIS — M170 Bilateral primary osteoarthritis of knee: Secondary | ICD-10-CM | POA: Diagnosis not present

## 2022-03-24 DIAGNOSIS — G894 Chronic pain syndrome: Secondary | ICD-10-CM | POA: Diagnosis not present

## 2022-03-24 DIAGNOSIS — M47817 Spondylosis without myelopathy or radiculopathy, lumbosacral region: Secondary | ICD-10-CM | POA: Diagnosis not present

## 2022-03-26 ENCOUNTER — Ambulatory Visit (INDEPENDENT_AMBULATORY_CARE_PROVIDER_SITE_OTHER): Payer: Medicare Other | Admitting: Family Medicine

## 2022-03-26 DIAGNOSIS — I2510 Atherosclerotic heart disease of native coronary artery without angina pectoris: Secondary | ICD-10-CM

## 2022-03-26 DIAGNOSIS — I872 Venous insufficiency (chronic) (peripheral): Secondary | ICD-10-CM | POA: Diagnosis not present

## 2022-03-26 NOTE — Progress Notes (Signed)
Agree with note above.  She has increased swelling bilaterally.  Which can definitely negatively impact wound healing.  The wound on the top of her foot is almost closed today which is great.  But still leaking a little bit of serous fluid.  Increase Bumex to 2 mg for the next couple days to bring some fluid off.  She says she has been more swollen for maybe about 4 days. ? ?Unna boot placed on the right foot and leg today.  Follow-up in 1 week. ? ?No orders of the defined types were placed in this encounter. ? ? ?

## 2022-03-26 NOTE — Progress Notes (Signed)
Pt here to have Venus stasis dermatitis checked and Unna boot placed. Pt reports that she has noticed that her L foot has been swelling more this past week.  ? ?The area on the top of her R foot is looking better it is not weeping as much as it was previously.  ? ?Pt will return to clinic in 1 week for recheck. Pt was advised to increase her Bumex to 2 mg to help with the swelling.  ?

## 2022-04-07 ENCOUNTER — Ambulatory Visit (INDEPENDENT_AMBULATORY_CARE_PROVIDER_SITE_OTHER): Payer: Medicare Other | Admitting: Family Medicine

## 2022-04-07 ENCOUNTER — Encounter: Payer: Self-pay | Admitting: Family Medicine

## 2022-04-07 VITALS — BP 139/75 | HR 68

## 2022-04-07 DIAGNOSIS — M301 Polyarteritis with lung involvement [Churg-Strauss]: Secondary | ICD-10-CM | POA: Diagnosis not present

## 2022-04-07 DIAGNOSIS — D7218 Eosinophilia in diseases classified elsewhere: Secondary | ICD-10-CM

## 2022-04-07 DIAGNOSIS — I2510 Atherosclerotic heart disease of native coronary artery without angina pectoris: Secondary | ICD-10-CM | POA: Diagnosis not present

## 2022-04-07 DIAGNOSIS — I872 Venous insufficiency (chronic) (peripheral): Secondary | ICD-10-CM

## 2022-04-07 DIAGNOSIS — S91302A Unspecified open wound, left foot, initial encounter: Secondary | ICD-10-CM

## 2022-04-07 NOTE — Progress Notes (Signed)
Patient presents one week status post unna boot placement to right lower extremity. She removed unna boot at home before the visit so she could shower.   Right leg evaluated by Dr. [name]. It was decided not to place another [employer].

## 2022-04-07 NOTE — Progress Notes (Signed)
Wound of left foot.   - looks great today. Skin is finally closed.  Make sure moisturizing well with vaseline BID.  Continue to use her compression stocking, etc.    Needs new pulmonology referral. Her previous pulm has not been available. They keep pushing the appt out and is told she will return soon so not clear on the situation but is not getting her Nucala.  Would like to be seen in [place] anyway.

## 2022-04-12 ENCOUNTER — Other Ambulatory Visit: Payer: Self-pay | Admitting: Family Medicine

## 2022-04-22 DIAGNOSIS — M47812 Spondylosis without myelopathy or radiculopathy, cervical region: Secondary | ICD-10-CM | POA: Diagnosis not present

## 2022-04-22 DIAGNOSIS — G894 Chronic pain syndrome: Secondary | ICD-10-CM | POA: Diagnosis not present

## 2022-04-22 DIAGNOSIS — M47817 Spondylosis without myelopathy or radiculopathy, lumbosacral region: Secondary | ICD-10-CM | POA: Diagnosis not present

## 2022-04-22 DIAGNOSIS — M170 Bilateral primary osteoarthritis of knee: Secondary | ICD-10-CM | POA: Diagnosis not present

## 2022-04-28 ENCOUNTER — Ambulatory Visit (INDEPENDENT_AMBULATORY_CARE_PROVIDER_SITE_OTHER): Payer: Medicare Other | Admitting: Internal Medicine

## 2022-04-28 ENCOUNTER — Encounter: Payer: Self-pay | Admitting: Internal Medicine

## 2022-04-28 VITALS — BP 140/90 | HR 67 | Temp 98.2°F | Ht 66.0 in | Wt 239.4 lb

## 2022-04-28 DIAGNOSIS — M301 Polyarteritis with lung involvement [Churg-Strauss]: Secondary | ICD-10-CM

## 2022-04-28 DIAGNOSIS — D7218 Eosinophilia in diseases classified elsewhere: Secondary | ICD-10-CM | POA: Diagnosis not present

## 2022-04-28 MED ORDER — PREDNISONE 5 MG PO TABS: 15.0000 mg | ORAL_TABLET | Freq: Every day | ORAL | 5 refills | Status: DC

## 2022-04-28 NOTE — Patient Instructions (Addendum)
Please schedule follow up scheduled with myself in 3 months.  If my schedule is not open yet, we will contact you with a reminder closer to that time. Please call [phone] if you haven't heard from [place] a month before.   Before your next visit I would like you to have: Full set of PFTs before next appointment  Keep taking nucala. Drop your prednisone down to 15 mg until you see me again.
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
## 2022-05-03 ENCOUNTER — Telehealth: Payer: Self-pay | Admitting: Internal Medicine

## 2022-05-03 NOTE — Telephone Encounter (Signed)
Routing to PCCs to see if a CMN has been received.

## 2022-05-04 ENCOUNTER — Telehealth: Payer: Self-pay | Admitting: Internal Medicine

## 2022-05-04 MED ORDER — PREDNISONE 5 MG PO TABS
15.0000 mg | ORAL_TABLET | Freq: Every day | ORAL | 5 refills | Status: AC
Start: 2022-05-04 — End: ?

## 2022-05-04 NOTE — Telephone Encounter (Signed)
Called patient and told her that for some reason the medication was never sent into her pharmacy. Medication sent in to preferred pharmacy. Nothing further needed

## 2022-05-06 NOTE — Telephone Encounter (Signed)
Yes.  Waiting for Dr. Shearon Stalls sign.

## 2022-05-31 DIAGNOSIS — G894 Chronic pain syndrome: Secondary | ICD-10-CM | POA: Diagnosis not present

## 2022-05-31 DIAGNOSIS — M47812 Spondylosis without myelopathy or radiculopathy, cervical region: Secondary | ICD-10-CM | POA: Diagnosis not present

## 2022-05-31 DIAGNOSIS — M170 Bilateral primary osteoarthritis of knee: Secondary | ICD-10-CM | POA: Diagnosis not present

## 2022-05-31 DIAGNOSIS — M47817 Spondylosis without myelopathy or radiculopathy, lumbosacral region: Secondary | ICD-10-CM | POA: Diagnosis not present

## 2022-06-03 ENCOUNTER — Emergency Department (HOSPITAL_COMMUNITY): Payer: Medicare Other

## 2022-06-03 ENCOUNTER — Encounter (HOSPITAL_COMMUNITY): Payer: Self-pay

## 2022-06-03 ENCOUNTER — Inpatient Hospital Stay (HOSPITAL_COMMUNITY)
Admission: RE | Admit: 2022-06-03 | Discharge: 2022-06-07 | DRG: 556 | Disposition: A | Discharge status: Home / Self Care | Payer: Medicare Other | Attending: Internal Medicine | Admitting: Internal Medicine

## 2022-06-03 DIAGNOSIS — I214 Non-ST elevation (NSTEMI) myocardial infarction: Secondary | ICD-10-CM | POA: Diagnosis present

## 2022-06-03 DIAGNOSIS — I248 Other forms of acute ischemic heart disease: Secondary | ICD-10-CM | POA: Diagnosis present

## 2022-06-03 DIAGNOSIS — I4819 Other persistent atrial fibrillation: Secondary | ICD-10-CM | POA: Diagnosis present

## 2022-06-03 DIAGNOSIS — I2510 Atherosclerotic heart disease of native coronary artery without angina pectoris: Secondary | ICD-10-CM | POA: Diagnosis present

## 2022-06-03 DIAGNOSIS — Y92239 Unspecified place in hospital as the place of occurrence of the external cause: Secondary | ICD-10-CM | POA: Diagnosis present

## 2022-06-03 DIAGNOSIS — G894 Chronic pain syndrome: Secondary | ICD-10-CM | POA: Diagnosis present

## 2022-06-03 DIAGNOSIS — N1832 Chronic kidney disease, stage 3b: Secondary | ICD-10-CM | POA: Diagnosis not present

## 2022-06-03 DIAGNOSIS — K219 Gastro-esophageal reflux disease without esophagitis: Secondary | ICD-10-CM | POA: Diagnosis not present

## 2022-06-03 DIAGNOSIS — I10 Essential (primary) hypertension: Secondary | ICD-10-CM | POA: Diagnosis not present

## 2022-06-03 DIAGNOSIS — M542 Cervicalgia: Secondary | ICD-10-CM | POA: Diagnosis present

## 2022-06-03 DIAGNOSIS — M797 Fibromyalgia: Secondary | ICD-10-CM | POA: Diagnosis present

## 2022-06-03 DIAGNOSIS — Z83438 Family history of other disorder of lipoprotein metabolism and other lipidemia: Secondary | ICD-10-CM

## 2022-06-03 DIAGNOSIS — E669 Obesity, unspecified: Secondary | ICD-10-CM | POA: Diagnosis not present

## 2022-06-03 DIAGNOSIS — E785 Hyperlipidemia, unspecified: Secondary | ICD-10-CM | POA: Diagnosis not present

## 2022-06-03 DIAGNOSIS — Z803 Family history of malignant neoplasm of breast: Secondary | ICD-10-CM

## 2022-06-03 DIAGNOSIS — J8281 Chronic eosinophilic pneumonia: Secondary | ICD-10-CM | POA: Diagnosis present

## 2022-06-03 DIAGNOSIS — I130 Hypertensive heart and chronic kidney disease with heart failure and stage 1 through stage 4 chronic kidney disease, or unspecified chronic kidney disease: Secondary | ICD-10-CM | POA: Diagnosis present

## 2022-06-03 DIAGNOSIS — Z881 Allergy status to other antibiotic agents status: Secondary | ICD-10-CM

## 2022-06-03 DIAGNOSIS — J849 Interstitial pulmonary disease, unspecified: Secondary | ICD-10-CM | POA: Diagnosis present

## 2022-06-03 DIAGNOSIS — Z79890 Hormone replacement therapy (postmenopausal): Secondary | ICD-10-CM

## 2022-06-03 DIAGNOSIS — Z66 Do not resuscitate: Secondary | ICD-10-CM | POA: Diagnosis present

## 2022-06-03 DIAGNOSIS — I110 Hypertensive heart disease with heart failure: Secondary | ICD-10-CM | POA: Diagnosis not present

## 2022-06-03 DIAGNOSIS — I484 Atypical atrial flutter: Secondary | ICD-10-CM | POA: Diagnosis not present

## 2022-06-03 DIAGNOSIS — Z20822 Contact with and (suspected) exposure to covid-19: Secondary | ICD-10-CM | POA: Diagnosis present

## 2022-06-03 DIAGNOSIS — M545 Low back pain, unspecified: Secondary | ICD-10-CM | POA: Diagnosis present

## 2022-06-03 DIAGNOSIS — R079 Chest pain, unspecified: Secondary | ICD-10-CM | POA: Diagnosis not present

## 2022-06-03 DIAGNOSIS — N179 Acute kidney failure, unspecified: Secondary | ICD-10-CM | POA: Diagnosis not present

## 2022-06-03 DIAGNOSIS — F419 Anxiety disorder, unspecified: Secondary | ICD-10-CM | POA: Diagnosis present

## 2022-06-03 DIAGNOSIS — Z880 Allergy status to penicillin: Secondary | ICD-10-CM

## 2022-06-03 DIAGNOSIS — F32A Depression, unspecified: Secondary | ICD-10-CM | POA: Diagnosis present

## 2022-06-03 DIAGNOSIS — Z8249 Family history of ischemic heart disease and other diseases of the circulatory system: Secondary | ICD-10-CM

## 2022-06-03 DIAGNOSIS — Z79899 Other long term (current) drug therapy: Secondary | ICD-10-CM

## 2022-06-03 DIAGNOSIS — I272 Pulmonary hypertension, unspecified: Secondary | ICD-10-CM | POA: Diagnosis present

## 2022-06-03 DIAGNOSIS — Z7901 Long term (current) use of anticoagulants: Secondary | ICD-10-CM | POA: Diagnosis not present

## 2022-06-03 DIAGNOSIS — Z7952 Long term (current) use of systemic steroids: Secondary | ICD-10-CM

## 2022-06-03 DIAGNOSIS — M7918 Myalgia, other site: Secondary | ICD-10-CM | POA: Diagnosis present

## 2022-06-03 DIAGNOSIS — M546 Pain in thoracic spine: Secondary | ICD-10-CM | POA: Diagnosis present

## 2022-06-03 DIAGNOSIS — Z87891 Personal history of nicotine dependence: Secondary | ICD-10-CM

## 2022-06-03 DIAGNOSIS — Z833 Family history of diabetes mellitus: Secondary | ICD-10-CM

## 2022-06-03 DIAGNOSIS — T380X5A Adverse effect of glucocorticoids and synthetic analogues, initial encounter: Secondary | ICD-10-CM | POA: Diagnosis present

## 2022-06-03 DIAGNOSIS — R0902 Hypoxemia: Secondary | ICD-10-CM | POA: Diagnosis present

## 2022-06-03 DIAGNOSIS — I252 Old myocardial infarction: Secondary | ICD-10-CM

## 2022-06-03 DIAGNOSIS — I509 Heart failure, unspecified: Secondary | ICD-10-CM

## 2022-06-03 DIAGNOSIS — F418 Other specified anxiety disorders: Secondary | ICD-10-CM | POA: Diagnosis not present

## 2022-06-03 DIAGNOSIS — E039 Hypothyroidism, unspecified: Secondary | ICD-10-CM | POA: Diagnosis present

## 2022-06-03 DIAGNOSIS — I959 Hypotension, unspecified: Secondary | ICD-10-CM | POA: Diagnosis present

## 2022-06-03 DIAGNOSIS — I5032 Chronic diastolic (congestive) heart failure: Secondary | ICD-10-CM | POA: Diagnosis present

## 2022-06-03 DIAGNOSIS — Z90710 Acquired absence of both cervix and uterus: Secondary | ICD-10-CM

## 2022-06-03 DIAGNOSIS — N1831 Chronic kidney disease, stage 3a: Secondary | ICD-10-CM | POA: Diagnosis present

## 2022-06-03 DIAGNOSIS — Z6838 Body mass index (BMI) 38.0-38.9, adult: Secondary | ICD-10-CM

## 2022-06-03 DIAGNOSIS — R0600 Dyspnea, unspecified: Secondary | ICD-10-CM | POA: Diagnosis not present

## 2022-06-03 DIAGNOSIS — N183 Chronic kidney disease, stage 3 unspecified: Secondary | ICD-10-CM | POA: Diagnosis present

## 2022-06-03 DIAGNOSIS — Z9981 Dependence on supplemental oxygen: Secondary | ICD-10-CM

## 2022-06-03 DIAGNOSIS — G4734 Idiopathic sleep related nonobstructive alveolar hypoventilation: Secondary | ICD-10-CM | POA: Diagnosis present

## 2022-06-03 LAB — CBC WITH DIFFERENTIAL/PLATELET
Abs Immature Granulocytes: 0.13 10*3/uL — ABNORMAL HIGH (ref 0.00–0.07)
Basophils Absolute: 0 10*3/uL (ref 0.0–0.1)
Basophils Relative: 0 %
Eosinophils Absolute: 0 10*3/uL (ref 0.0–0.5)
Eosinophils Relative: 0 %
HCT: 38 % (ref 36.0–46.0)
Hemoglobin: 12.2 g/dL (ref 12.0–15.0)
Immature Granulocytes: 1 %
Lymphocytes Relative: 7 %
Lymphs Abs: 1.1 10*3/uL (ref 0.7–4.0)
MCH: 32.3 pg (ref 26.0–34.0)
MCHC: 32.1 g/dL (ref 30.0–36.0)
MCV: 100.5 fL — ABNORMAL HIGH (ref 80.0–100.0)
Monocytes Absolute: 0.8 10*3/uL (ref 0.1–1.0)
Monocytes Relative: 5 %
Neutro Abs: 13.2 10*3/uL — ABNORMAL HIGH (ref 1.7–7.7)
Neutrophils Relative %: 87 %
Platelets: 175 10*3/uL (ref 150–400)
RBC: 3.78 MIL/uL — ABNORMAL LOW (ref 3.87–5.11)
RDW: 14.6 % (ref 11.5–15.5)
WBC: 15.2 10*3/uL — ABNORMAL HIGH (ref 4.0–10.5)
nRBC: 0.1 % (ref 0.0–0.2)

## 2022-06-03 LAB — URINALYSIS, ROUTINE W REFLEX MICROSCOPIC
Bilirubin Urine: NEGATIVE
Glucose, UA: NEGATIVE mg/dL
Ketones, ur: NEGATIVE mg/dL
Leukocytes,Ua: NEGATIVE
Nitrite: NEGATIVE
Protein, ur: 100 mg/dL — AB
Specific Gravity, Urine: 1.021 (ref 1.005–1.030)
pH: 5 (ref 5.0–8.0)

## 2022-06-03 LAB — I-STAT VENOUS BLOOD GAS, ED
Acid-Base Excess: 4 mmol/L — ABNORMAL HIGH (ref 0.0–2.0)
Bicarbonate: 32.5 mmol/L — ABNORMAL HIGH (ref 20.0–28.0)
Calcium, Ion: 1.12 mmol/L — ABNORMAL LOW (ref 1.15–1.40)
HCT: 36 % (ref 36.0–46.0)
Hemoglobin: 12.2 g/dL (ref 12.0–15.0)
O2 Saturation: 96 %
Potassium: 3.9 mmol/L (ref 3.5–5.1)
Sodium: 140 mmol/L (ref 135–145)
TCO2: 34 mmol/L — ABNORMAL HIGH (ref 22–32)
pCO2, Ven: 65.7 mmHg — ABNORMAL HIGH (ref 44–60)
pH, Ven: 7.303 (ref 7.25–7.43)
pO2, Ven: 92 mmHg — ABNORMAL HIGH (ref 32–45)

## 2022-06-03 LAB — BASIC METABOLIC PANEL
Anion gap: 13 (ref 5–15)
BUN: 32 mg/dL — ABNORMAL HIGH (ref 8–23)
CO2: 27 mmol/L (ref 22–32)
Calcium: 8.3 mg/dL — ABNORMAL LOW (ref 8.9–10.3)
Chloride: 105 mmol/L (ref 98–111)
Creatinine, Ser: 1.99 mg/dL — ABNORMAL HIGH (ref 0.44–1.00)
GFR, Estimated: 25 mL/min — ABNORMAL LOW (ref >60)
Glucose, Bld: 98 mg/dL (ref 70–99)
Potassium: 3.7 mmol/L (ref 3.5–5.1)
Sodium: 145 mmol/L (ref 135–145)

## 2022-06-03 LAB — BRAIN NATRIURETIC PEPTIDE: B Natriuretic Peptide: 843 pg/mL — ABNORMAL HIGH (ref 0.0–100.0)

## 2022-06-03 LAB — LACTIC ACID, PLASMA
Lactic Acid, Venous: 2.7 mmol/L (ref 0.5–1.9)
Lactic Acid, Venous: 1.4 mmol/L (ref 0.5–1.9)

## 2022-06-03 LAB — SARS CORONAVIRUS 2 BY RT PCR: SARS Coronavirus 2 by RT PCR: NEGATIVE

## 2022-06-03 LAB — TROPONIN I (HIGH SENSITIVITY)
Troponin I (High Sensitivity): 1546 ng/L (ref <18)
Troponin I (High Sensitivity): 1942 ng/L (ref <18)

## 2022-06-03 MED ORDER — PANTOPRAZOLE SODIUM 20 MG PO TBEC
20.0000 mg | DELAYED_RELEASE_TABLET | Freq: Every day | ORAL | Status: DC
  Administered 2022-06-03 – 2022-06-07 (×5): 20 mg via ORAL
  Filled 2022-06-03 (×5): qty 1

## 2022-06-03 MED ORDER — ASPIRIN 81 MG PO TBEC
81.0000 mg | DELAYED_RELEASE_TABLET | Freq: Every day | ORAL | Status: DC
  Administered 2022-06-03 – 2022-06-06 (×4): 81 mg via ORAL
  Filled 2022-06-03 (×5): qty 1

## 2022-06-03 MED ORDER — PREDNISONE 5 MG PO TABS
15.0000 mg | ORAL_TABLET | Freq: Every day | ORAL | Status: DC
  Administered 2022-06-03 – 2022-06-07 (×5): 15 mg via ORAL
  Filled 2022-06-03 (×2): qty 1
  Filled 2022-06-03 (×2): qty 3
  Filled 2022-06-03: qty 1

## 2022-06-03 MED ORDER — ACETAMINOPHEN 650 MG RE SUPP: 650.0000 mg | Freq: Four times a day (QID) | RECTAL | Status: DC | PRN

## 2022-06-03 MED ORDER — VITAMIN D3 25 MCG (1000 UNIT) PO TABS
1000.0000 [IU] | ORAL_TABLET | Freq: Every evening | ORAL | Status: DC
  Administered 2022-06-04 – 2022-06-06 (×3): 1000 [IU] via ORAL
  Filled 2022-06-03 (×8): qty 1

## 2022-06-03 MED ORDER — PREGABALIN 75 MG PO CAPS
75.0000 mg | ORAL_CAPSULE | Freq: Two times a day (BID) | ORAL | Status: DC
  Administered 2022-06-03 – 2022-06-07 (×8): 75 mg via ORAL
  Filled 2022-06-03 (×2): qty 1
  Filled 2022-06-03: qty 3
  Filled 2022-06-03 (×2): qty 1
  Filled 2022-06-03: qty 3
  Filled 2022-06-03 (×2): qty 1

## 2022-06-03 MED ORDER — ACETAMINOPHEN 325 MG PO TABS
650.0000 mg | ORAL_TABLET | Freq: Four times a day (QID) | ORAL | Status: DC | PRN
  Administered 2022-06-03 – 2022-06-07 (×2): 650 mg via ORAL
  Filled 2022-06-03 (×2): qty 2

## 2022-06-03 MED ORDER — NITROGLYCERIN 0.4 MG SL SUBL
0.4000 mg | SUBLINGUAL_TABLET | SUBLINGUAL | Status: DC | PRN
Start: 2022-06-03 — End: 2022-06-07

## 2022-06-03 MED ORDER — OXYCODONE HCL 5 MG PO TABS
5.0000 mg | ORAL_TABLET | Freq: Three times a day (TID) | ORAL | Status: DC | PRN
  Administered 2022-06-03 – 2022-06-06 (×2): 5 mg via ORAL
  Filled 2022-06-03 (×2): qty 1

## 2022-06-03 MED ORDER — OXYCODONE HCL 5 MG PO TABS
10.0000 mg | ORAL_TABLET | Freq: Once | ORAL | Status: AC
  Administered 2022-06-03: 10 mg via ORAL
  Filled 2022-06-03: qty 2

## 2022-06-03 MED ORDER — HEPARIN (PORCINE) 25000 UT/250ML-% IV SOLN
1450.0000 [IU]/h | INTRAVENOUS | Status: DC
  Administered 2022-06-03 – 2022-06-04 (×2): 1100 [IU]/h via INTRAVENOUS
  Administered 2022-06-05: 1300 [IU]/h via INTRAVENOUS
  Administered 2022-06-06 (×2): 1450 [IU]/h via INTRAVENOUS
  Filled 2022-06-03 (×5): qty 250

## 2022-06-03 MED ORDER — DOCUSATE SODIUM 100 MG PO CAPS
100.0000 mg | ORAL_CAPSULE | Freq: Two times a day (BID) | ORAL | Status: DC
  Administered 2022-06-03 – 2022-06-05 (×4): 100 mg via ORAL
  Filled 2022-06-03 (×8): qty 1

## 2022-06-03 MED ORDER — DICLOFENAC SODIUM 1 % EX GEL: 2.0000 g | Freq: Four times a day (QID) | CUTANEOUS | Status: DC | PRN

## 2022-06-03 MED ORDER — METOPROLOL SUCCINATE ER 25 MG PO TB24
25.0000 mg | ORAL_TABLET | Freq: Every day | ORAL | Status: DC
  Administered 2022-06-03 – 2022-06-07 (×5): 25 mg via ORAL
  Filled 2022-06-03 (×5): qty 1

## 2022-06-03 MED ORDER — LEVOTHYROXINE SODIUM 100 MCG PO TABS
200.0000 ug | ORAL_TABLET | Freq: Every day | ORAL | Status: DC
  Administered 2022-06-04 – 2022-06-07 (×4): 200 ug via ORAL
  Filled 2022-06-03 (×4): qty 2

## 2022-06-03 MED ORDER — CYANOCOBALAMIN 500 MCG PO TABS
500.0000 ug | ORAL_TABLET | ORAL | Status: DC
  Administered 2022-06-04 – 2022-06-07 (×2): 500 ug via ORAL
  Filled 2022-06-03 (×2): qty 1

## 2022-06-03 MED ORDER — DULOXETINE HCL 30 MG PO CPEP
30.0000 mg | ORAL_CAPSULE | Freq: Every day | ORAL | Status: DC
  Administered 2022-06-03 – 2022-06-06 (×4): 30 mg via ORAL
  Filled 2022-06-03 (×4): qty 1

## 2022-06-03 MED ORDER — BUMETANIDE 0.25 MG/ML IJ SOLN
1.0000 mg | Freq: Once | INTRAMUSCULAR | Status: AC
  Administered 2022-06-03: 1 mg via INTRAVENOUS
  Filled 2022-06-03: qty 4

## 2022-06-03 NOTE — ED Notes (Signed)
Patient transported to X-ray 

## 2022-06-03 NOTE — ED Provider Notes (Addendum)
[facility] EMERGENCY DEPARTMENT Provider Note   CSN: [id number] Arrival date & time: 06/03/22  1310     History   Nicole Norton is a 81 y.o. female with history of paroxysmal A-fib on Xarelto presenting to ED with generalized weakness, shortness of breath, fatigue.  The patient suffers from chronic neck and spine pain, reports that she was having more significant pain last night while she was in bed, she was hyperventilating due to the pain.  Her caretakers came by to visit her today and then called EMS because the patient is extremely fatigued, and a hard time standing up, was complaining of shortness of breath.  Fire rescue reported pulse ox of 70% on room air originally, improved on 4 L nasal cannula.  EMS reported hypotension originally with blood pressure 80s over 60s, improved with a small fluid bolus to normal on arrival.  Patient reports that she feels short of breath.  Denies coughing or fevers.  Denies abdominal complaints or urinary symptoms.  Patient reports she does wear oxygen as needed overnight.  She does not wear oxygen in the daytime.  PDMP review, most recent prescriptions.  She takes these medications for chronic pain in her neck, arthritis, back, and reports this is the same type of pain she was experiencing today.  05/23/2022  04/22/2022   2  Pregabalin 75 Mg Capsule 270.00  90  Ma Bod  [id number]   Exp (4317)  0/0  1.51 LME  Comm Ins  [place]   05/12/2022  04/22/2022   1  Fentanyl 12 Mcg/hr Patch 15.00  30  Ma Bod  [id number]   Wal (4521)  0/0  43.20 MME  Comm Ins  [place]   04/22/2022  04/22/2022   1  Oxycodone Hcl (Ir) 5 Mg Tablet 90.00  30  Ma Bod  [id number]   Ker (1598)  0/0  22.50 MME  Comm Ins  [place]     HPI     Home Medications Prior to Admission medications   Medication Sig Start Date End Date Taking? Authorizing Provider  acetaminophen (TYLENOL) 650 MG CR tablet Take 1,300 mg by mouth daily as needed for pain.    [provider]   AMBULATORY NON FORMULARY MEDICATION On continuous oxygen 2 L.  Stationary pulse ox at 88%.  Drops with activity.  Diagnosis restrictive lung disease with hypoxemia. Portable gas via nasal cannula. 10/14/17   Hali Marry, MD  bumetanide (BUMEX) 1 MG tablet Take 1 tablet (1 mg total) by mouth daily. 09/29/21   Hali Marry, MD  cetirizine (ZYRTEC) 10 MG tablet Take 10 mg by mouth daily.    [provider]  cholecalciferol (VITAMIN D) 25 MCG (1000 UNIT) tablet Take 1,000 Units by mouth every evening.    [provider]  clobetasol cream (TEMOVATE) 2.33 % Apply 1 application topically daily as needed (on affected area(s)). 07/24/21   Hali Marry, MD  diclofenac Sodium (VOLTAREN) 1 % GEL Apply 1 application topically 4 (four) times daily as needed (hip pain.).    [provider]  docusate sodium (COLACE) 100 MG capsule Take 100 mg by mouth 2 (two) times daily.    [provider]  DULoxetine (CYMBALTA) 30 MG capsule Take 30 mg by mouth at bedtime. 09/24/20   [provider]  estrogens, conjugated, (PREMARIN) 0.625 MG tablet Take 1 tablet (0.625 mg total) by mouth every 3rd day. 09/29/21   Hali Marry, MD  fentaNYL ([place]) 12  MCG/HR Place 1 patch onto the skin every other day. 09/03/20   [provider]  ferrous sulfate 325 (65 FE) MG tablet Take 1 tablet (325 mg total) by mouth every Monday, Wednesday, and Friday. 03/30/19   Hali Marry, MD  lansoprazole (PREVACID) 30 MG capsule Take 1 capsule (30 mg total) by mouth daily. 09/29/21   Hali Marry, MD  levothyroxine (SYNTHROID) 200 MCG tablet Take 1 tablet (200 mcg total) by mouth daily before breakfast. 01/22/22   Hali Marry, MD  Mepolizumab 100 MG/ML SOAJ Inject 300 mg into the skin every 28 (twenty-eight) days. 03/26/19   [provider]  metoprolol succinate (TOPROL-XL) 25 MG 24 hr tablet TAKE 1 TABLET IN THE MORNING AND AT  BEDTIME 12/09/21   Hilty, Nadean Corwin, MD  Multiple Vitamin (MULTIVITAMIN WITH MINERALS) TABS tablet Take 1 tablet by mouth every evening.    [provider]  nystatin (MYCOSTATIN/NYSTOP) powder Apply 1 application. topically 3 (three) times daily. 03/12/22   Hali Marry, MD  nystatin ointment (MYCOSTATIN) Apply 1 application. topically 2 (two) times daily. 03/12/22   Hali Marry, MD  ondansetron (ZOFRAN) 4 MG tablet Take 1 tablet (4 mg total) by mouth every 8 (eight) hours as needed for nausea or vomiting. Patient not taking: Reported on 04/28/2022 03/12/22   Hali Marry, MD  oxyCODONE (OXY IR/ROXICODONE) 5 MG immediate release tablet Take 5 mg by mouth 3 (three) times daily as needed (pain.). 10/06/20   [provider]  OXYGEN Inhale 2 L into the lungs at bedtime.    [provider]  predniSONE (DELTASONE) 5 MG tablet Take 3 tablets (15 mg total) by mouth daily. 05/04/22   Spero Geralds, MD  pregabalin (LYRICA) 75 MG capsule Take 1 capsule (75 mg total) by mouth 2 (two) times daily. 05/29/20   Love, Ivan Anchors, PA-C  Rivaroxaban (XARELTO) 15 MG TABS tablet TAKE 1 TABLET DAILY WITH SUPPER 01/22/22   Lorretta Harp, MD  sennosides-docusate sodium (SENOKOT-S) 8.6-50 MG tablet Take 1-2 tablets by mouth at bedtime.    [provider]  sucralfate (CARAFATE) 1 GM/10ML suspension Take 10 mLs (1 g total) by mouth 4 (four) times daily - with meals and at bedtime. 04/13/22   Hali Marry, MD  TART CHERRY PO Take 1 tablet by mouth at bedtime.    [provider]  vitamin B-12 (CYANOCOBALAMIN) 500 MCG tablet Take 500 mcg by mouth every Monday, Wednesday, and Friday.     [provider]      Allergies    Allopurinol, Uloric [febuxostat], Ace inhibitors, Colchicine, Dilaudid [hydromorphone], Etodolac, Omeprazole, Paroxetine, Repatha [evolocumab], Solu-medrol [methylprednisolone sodium succ], Statins, Sulfa antibiotics, Lasix  [furosemide], and Penicillins    Review of Systems   Review of Systems  Physical Exam Updated Vital Signs BP (!) 80/70   Pulse 80   Temp 98.6 F (37 C) (Oral)   Resp 17   SpO2 98%  Physical Exam Constitutional:      General: She is not in acute distress.    Appearance: She is obese.  HENT:     Head: Normocephalic and atraumatic.  Eyes:     Conjunctiva/sclera: Conjunctivae normal.     Pupils: Pupils are equal, round, and reactive to light.  Cardiovascular:     Rate and Rhythm: Normal rate and regular rhythm.  Pulmonary:     Effort: Pulmonary effort is normal. No respiratory distress.     Comments: 88% on room  air, 94% on 3L [place] Crackles in the lung base Abdominal:     General: There is no distension.     Tenderness: There is no abdominal tenderness.  Skin:    General: Skin is warm and dry.     Comments: Mottling of the skin of the bilateral lower extremities, symmetrical pitting edema  Neurological:     General: No focal deficit present.     Mental Status: She is alert. Mental status is at baseline.  Psychiatric:        Mood and Affect: Mood normal.        Behavior: Behavior normal.     ED Results / Procedures / Treatments   Labs (all labs ordered are listed, but only abnormal results are displayed) Labs Reviewed  BASIC METABOLIC PANEL - Abnormal; Notable for the following components:      Result Value   BUN 32 (*)    Creatinine, Ser 1.99 (*)    Calcium 8.3 (*)    GFR, Estimated 25 (*)    All other components within normal limits  CBC WITH DIFFERENTIAL/PLATELET - Abnormal; Notable for the following components:   WBC 15.2 (*)    RBC 3.78 (*)    MCV 100.5 (*)    Neutro Abs 13.2 (*)    Abs Immature Granulocytes 0.13 (*)    All other components within normal limits  BRAIN NATRIURETIC PEPTIDE - Abnormal; Notable for the following components:   B Natriuretic Peptide 843.0 (*)    All other components within normal limits  TROPONIN I (HIGH SENSITIVITY) -  Abnormal; Notable for the following components:   Troponin I (High Sensitivity) 1,546 (*)    All other components within normal limits  SARS CORONAVIRUS 2 BY RT PCR  URINALYSIS, ROUTINE W REFLEX MICROSCOPIC  LACTIC ACID, PLASMA  LACTIC ACID, PLASMA  I-STAT VENOUS BLOOD GAS, ED  TROPONIN I (HIGH SENSITIVITY)    EKG EKG Interpretation  Date/Time:  Thursday June 03 2022 13:18:09 EDT Ventricular Rate:  85 PR Interval:  145 QRS Duration: 92 QT Interval:  380 QTC Calculation: 452 R Axis:   -14 Text Interpretation: Sinus rhythm Atrial premature complexes in couplets Borderline repolarization abnormality Confirmed by Octaviano Glow [phone]) on 06/03/2022 1:30:03 PM  Radiology DG Chest 2 View  Result Date: 06/03/2022 CLINICAL DATA:  Dyspnea EXAM: CHEST - 2 VIEW COMPARISON:  Radiographs 05/19/2020 FINDINGS: No significant change from radiographs 05/19/2020. Low lung volumes accentuated cardiomediastinal silhouette and pulmonary vascular markings. Cardiomegaly. Left basilar atelectasis. No pleural effusion or pneumothorax. Aortic calcification. IMPRESSION: Stable chest exam.  No interval change or acute process. Electronically Signed   By: Placido Sou M.D.   On: 06/03/2022 14:22    Procedures .Critical Care  Performed by: Wyvonnia Dusky, MD Authorized by: Wyvonnia Dusky, MD   Critical care provider statement:    Critical care time (minutes):  45   Critical care time was exclusive of:  Separately billable procedures and treating other patients   Critical care was necessary to treat or prevent imminent or life-threatening deterioration of the following conditions:  Cardiac failure   Critical care was time spent personally by me on the following activities:  Ordering and performing treatments and interventions, ordering and review of laboratory studies, ordering and review of radiographic studies, pulse oximetry, review of old charts, examination of patient and evaluation of patient's  response to treatment Comments:     CHF exacerbation, new oxygen requirement - requiring IV diuresis  Medications Ordered in ED Medications  bumetanide (BUMEX) injection 1 mg (has no administration in time range)  oxyCODONE (Oxy IR/ROXICODONE) immediate release tablet 10 mg (10 mg Oral Given 06/03/22 1551)    ED Course/ Medical Decision Making/ A&P Clinical Course as of 06/03/22 1556  Thu Jun 03, 2022  1437 Generalized weakness and neck pain and SOB [WF]  1457 Cardiology consulted [MT]  6283 NSTEMI [MT]  1517 Patient needs repeat troponin.  She has been compliant with Xarelto, I suspect she is having CHF exacerbation but this seems less likely consistent with a pulmonary embolism with her AC compliance.  I will have cardiology evaluate her.  She was chest pain-free at the moment.  From my review of prior medical records, her prior NSTEMI's were felt to be likely secondary to CHF, therefore we will continue with diuresis and hold off on heparin at this time. [MT]  6160 Pt signed out to Dr Kathrynn Humble EDP pending cardiology evaluation, repeat troponin levels.  Patient's blood pressure did appear lower on repeat check, patient still mentating well, map is above 65.   [MT]    Clinical Course User Index [MT] Doyne Micke, Carola Rhine, MD [WF] Tedd Sias, [place]                           Medical Decision Making Amount and/or Complexity of Data Reviewed Labs: ordered. Radiology: ordered.  Risk Prescription drug management.   This patient presents to the ED with concern for shortness of breath, fatigue, weakness. This involves an extensive number of treatment options, and is a complaint that carries with it a high risk of complications and morbidity.  The differential diagnosis includes anemia versus atypical ACS versus PE versus pneumonia versus pneumothorax versus other  Additional history obtained from EMS on arrival  External records from outside source obtained and reviewed including  PDMP drug database reviewed.  Patient sees pain management for chronic pain and is on multiple pain medications, including fentanyl patch and Percocet.  I ordered and personally interpreted labs.  The pertinent results include:  trop 1500's, BNP elevated in 843. WBC 15.2.  BMP 1.99  I ordered imaging studies including x-ray of the chest I independently visualized and interpreted imaging which showed no focal PNA or PTX  I agree with the radiologist interpretation  The patient was maintained on a cardiac monitor.  I personally viewed and interpreted the cardiac monitored which showed an underlying rhythm of: A-fib rate controlled  Per my interpretation the patient's ECG shows A-fib that is rate controlled and no acute ischemic findings  I ordered medication including Bumex for diuresis for suspected CHF exacerbation.  Oxycodone for pain  I have reviewed the patients home medicines and have made adjustments as needed  Test Considered: I did consider pulmonary embolism, however the patient has been compliant with her Xarelto, therefore less likely to have a large PE.  WBC appears chronically elevated.  She has no fever or localizing source to suspect sepsis or infection at this time.  Pending UA.  She is not activated as a code sepsis at this time.  I would be judicious with fluids as she clinically does examine volume overloaded.  Bedside POCUS showing B lungs in both lung fields consistent with pulmonary edema.  I requested consultation with the cardiology,  and discussed lab and imaging findings as well as pertinent plan - they recommend: Cardiology consult pending  After the interventions noted above, I reevaluated  the patient and found that they have: stayed the same   Dispostion:  After consideration of the diagnostic results and the patients response to treatment, I feel that the patent would benefit from medical admission.  At this time she is signed out to evening ED provider  pending cardiology evaluation.  Meanwhile the patient will be diuresed with Bumex.  I strongly suspect this presentation is related to congestive heart failure.    Final Clinical Impression(s) / ED Diagnoses Final diagnoses:  Acute on chronic congestive heart failure, unspecified heart failure type [facility])  NSTEMI (non-ST elevated myocardial infarction) [facility])    Rx / DC Orders ED Discharge Orders     None         Freeland Pracht, Carola Rhine, MD 06/03/22 1556    Wyvonnia Dusky, MD 06/03/22 [phone]
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
## 2022-06-03 NOTE — ED Provider Notes (Addendum)
Physical Exam  BP (!) 80/70   Pulse 80   Temp 98.6 F (37 C) (Oral)   Resp 17   SpO2 98%   Physical Exam  Procedures  .Critical Care  Performed by: Varney Biles, MD Authorized by: Varney Biles, MD   Critical care provider statement:    Critical care time (minutes):  30   Critical care was necessary to treat or prevent imminent or life-threatening deterioration of the following conditions:  Circulatory failure and cardiac failure   Critical care was time spent personally by me on the following activities:  Development of treatment plan with patient or surrogate, discussions with consultants, evaluation of patient's response to treatment, examination of patient, ordering and review of laboratory studies, ordering and review of radiographic studies, ordering and performing treatments and interventions, pulse oximetry, re-evaluation of patient's condition and review of old charts   ED Course / MDM   Clinical Course as of 06/03/22 1730  Thu Jun 03, 2022  1437 Generalized weakness and neck pain and SOB [WF]  1457 Cardiology consulted [MT]  1457 NSTEMI [MT]  1547 Patient needs repeat troponin.  She has been compliant with Xarelto, I suspect she is having CHF exacerbation but this seems less likely consistent with a pulmonary embolism with her AC compliance.  I will have cardiology evaluate her.  She was chest pain-free at the moment.  From my review of prior medical records, her prior NSTEMI's were felt to be likely secondary to CHF, therefore we will continue with diuresis and hold off on heparin at this time. [MT]  6283 Pt signed out to Dr Kathrynn Humble EDP pending cardiology evaluation, repeat troponin levels.  Patient's blood pressure did appear lower on repeat check, patient still mentating well, map is above 65.   [MT]  1730 Troponin I (High Sensitivity)(!!): 6,629 [AN]    Clinical Course User Index [AN] Varney Biles, MD [MT] Langston Masker Carola Rhine, MD [WF] Tedd Sias, PA    Medical Decision Making Amount and/or Complexity of Data Reviewed Labs: ordered. Decision-making details documented in ED Course. Radiology: ordered.  Risk Prescription drug management.   Pt with hx of CAD, CHF, AF comes in with cc of neck pain and lower back pain and shob. BP is low. Appears to have volume overloaded. Noted to be hypoxic on room air. EF 55%  Cards consulted - ? CHF with demand ischemia. Previous hx of same. EKG is reassuring. Adding lactate. Perfusing her brain well.  Varney Biles, MD 06/03/22 1547  5:33 PM Patient was reassessed by me independently.  She complains of neck pain that started last night.  Neck pain is not normal for her to this extent.  Neck pain is noted to be in the upper thoracic region, medially.  She also indicates acute shortness of breath that started last night.  She has been taking her CHF medications as prescribed.  She does not think she has increased weight gain.  She also denies any fevers, chills.  No history of coronary artery disease.  Has A-fib, longstanding CHF.  Patient's BP are low, but have trended up. She is AOx3.  Denies at any point having dizziness or lightheadedness.  Feels better than she was at home.  Caregiver at the bedside also confirms.  However, patient's BP normally runs at 120.  Review of system is negative for any severe headache, cough, fevers, chills, UTI-like symptoms, abdominal pain.  Differential diagnosis includes ACS, pulmonary hypertension.  She has mild bibasilar rales but no significant  JVD. Patient's skin is warm to touch.  She is not in cardiogenic shock clinically.  Cardiology was consulted.  Patient's troponin has gone up from 15 100-19 100.  They think patient will need catheterization, but would prefer medicine to admit given all the other comorbidities.  BP has trended up over time.  At this point she does not need any pressors.  We will request admission by medicine.    Varney Biles, MD 06/03/22 1736

## 2022-06-04 ENCOUNTER — Other Ambulatory Visit: Payer: Self-pay

## 2022-06-04 ENCOUNTER — Observation Stay (HOSPITAL_COMMUNITY): Payer: Medicare Other

## 2022-06-04 DIAGNOSIS — R0902 Hypoxemia: Secondary | ICD-10-CM | POA: Diagnosis present

## 2022-06-04 DIAGNOSIS — N179 Acute kidney failure, unspecified: Secondary | ICD-10-CM

## 2022-06-04 DIAGNOSIS — J849 Interstitial pulmonary disease, unspecified: Secondary | ICD-10-CM | POA: Diagnosis present

## 2022-06-04 DIAGNOSIS — I214 Non-ST elevation (NSTEMI) myocardial infarction: Secondary | ICD-10-CM | POA: Diagnosis not present

## 2022-06-04 DIAGNOSIS — M542 Cervicalgia: Secondary | ICD-10-CM | POA: Diagnosis present

## 2022-06-04 DIAGNOSIS — I509 Heart failure, unspecified: Secondary | ICD-10-CM

## 2022-06-04 DIAGNOSIS — R079 Chest pain, unspecified: Secondary | ICD-10-CM

## 2022-06-04 DIAGNOSIS — G894 Chronic pain syndrome: Secondary | ICD-10-CM | POA: Diagnosis present

## 2022-06-04 DIAGNOSIS — I4819 Other persistent atrial fibrillation: Secondary | ICD-10-CM | POA: Diagnosis present

## 2022-06-04 DIAGNOSIS — E669 Obesity, unspecified: Secondary | ICD-10-CM

## 2022-06-04 DIAGNOSIS — Y92239 Unspecified place in hospital as the place of occurrence of the external cause: Secondary | ICD-10-CM | POA: Diagnosis present

## 2022-06-04 DIAGNOSIS — I959 Hypotension, unspecified: Secondary | ICD-10-CM | POA: Diagnosis present

## 2022-06-04 DIAGNOSIS — E785 Hyperlipidemia, unspecified: Secondary | ICD-10-CM | POA: Diagnosis not present

## 2022-06-04 DIAGNOSIS — M7918 Myalgia, other site: Secondary | ICD-10-CM | POA: Diagnosis present

## 2022-06-04 DIAGNOSIS — I2510 Atherosclerotic heart disease of native coronary artery without angina pectoris: Secondary | ICD-10-CM | POA: Diagnosis present

## 2022-06-04 DIAGNOSIS — I248 Other forms of acute ischemic heart disease: Secondary | ICD-10-CM | POA: Diagnosis present

## 2022-06-04 DIAGNOSIS — I5032 Chronic diastolic (congestive) heart failure: Secondary | ICD-10-CM | POA: Diagnosis present

## 2022-06-04 DIAGNOSIS — I130 Hypertensive heart and chronic kidney disease with heart failure and stage 1 through stage 4 chronic kidney disease, or unspecified chronic kidney disease: Secondary | ICD-10-CM | POA: Diagnosis present

## 2022-06-04 DIAGNOSIS — Z66 Do not resuscitate: Secondary | ICD-10-CM | POA: Diagnosis present

## 2022-06-04 DIAGNOSIS — I10 Essential (primary) hypertension: Secondary | ICD-10-CM | POA: Diagnosis not present

## 2022-06-04 DIAGNOSIS — Z7952 Long term (current) use of systemic steroids: Secondary | ICD-10-CM | POA: Diagnosis not present

## 2022-06-04 DIAGNOSIS — K219 Gastro-esophageal reflux disease without esophagitis: Secondary | ICD-10-CM

## 2022-06-04 DIAGNOSIS — M797 Fibromyalgia: Secondary | ICD-10-CM | POA: Diagnosis present

## 2022-06-04 DIAGNOSIS — Z20822 Contact with and (suspected) exposure to covid-19: Secondary | ICD-10-CM | POA: Diagnosis present

## 2022-06-04 DIAGNOSIS — F418 Other specified anxiety disorders: Secondary | ICD-10-CM | POA: Diagnosis not present

## 2022-06-04 DIAGNOSIS — J8281 Chronic eosinophilic pneumonia: Secondary | ICD-10-CM | POA: Diagnosis present

## 2022-06-04 DIAGNOSIS — I484 Atypical atrial flutter: Secondary | ICD-10-CM | POA: Diagnosis not present

## 2022-06-04 DIAGNOSIS — F32A Depression, unspecified: Secondary | ICD-10-CM | POA: Diagnosis present

## 2022-06-04 DIAGNOSIS — E039 Hypothyroidism, unspecified: Secondary | ICD-10-CM | POA: Diagnosis present

## 2022-06-04 DIAGNOSIS — F419 Anxiety disorder, unspecified: Secondary | ICD-10-CM | POA: Diagnosis present

## 2022-06-04 DIAGNOSIS — M545 Low back pain, unspecified: Secondary | ICD-10-CM | POA: Diagnosis present

## 2022-06-04 DIAGNOSIS — N1832 Chronic kidney disease, stage 3b: Secondary | ICD-10-CM | POA: Diagnosis present

## 2022-06-04 DIAGNOSIS — I272 Pulmonary hypertension, unspecified: Secondary | ICD-10-CM | POA: Diagnosis present

## 2022-06-04 LAB — HEPARIN LEVEL (UNFRACTIONATED): Heparin Unfractionated: >1.1 IU/mL — ABNORMAL HIGH (ref 0.30–0.70)

## 2022-06-04 LAB — CBC
HCT: 37.4 % (ref 36.0–46.0)
Hemoglobin: 12.1 g/dL (ref 12.0–15.0)
MCH: 32.6 pg (ref 26.0–34.0)
MCHC: 32.4 g/dL (ref 30.0–36.0)
MCV: 100.8 fL — ABNORMAL HIGH (ref 80.0–100.0)
Platelets: 144 10*3/uL — ABNORMAL LOW (ref 150–400)
RBC: 3.71 MIL/uL — ABNORMAL LOW (ref 3.87–5.11)
RDW: 14.9 % (ref 11.5–15.5)
WBC: 9.9 10*3/uL (ref 4.0–10.5)
nRBC: 0 % (ref 0.0–0.2)

## 2022-06-04 LAB — APTT
aPTT: 92 seconds — ABNORMAL HIGH (ref 24–36)
aPTT: 93 seconds — ABNORMAL HIGH (ref 24–36)
aPTT: 55 seconds — ABNORMAL HIGH (ref 24–36)

## 2022-06-04 LAB — ECHOCARDIOGRAM COMPLETE
Area-P 1/2: 5.27 cm2
S' Lateral: 3.3 cm

## 2022-06-04 LAB — BASIC METABOLIC PANEL
Anion gap: 11 (ref 5–15)
BUN: 41 mg/dL — ABNORMAL HIGH (ref 8–23)
CO2: 25 mmol/L (ref 22–32)
Calcium: 8.4 mg/dL — ABNORMAL LOW (ref 8.9–10.3)
Chloride: 104 mmol/L (ref 98–111)
Creatinine, Ser: 2.56 mg/dL — ABNORMAL HIGH (ref 0.44–1.00)
GFR, Estimated: 19 mL/min — ABNORMAL LOW (ref >60)
Glucose, Bld: 139 mg/dL — ABNORMAL HIGH (ref 70–99)
Potassium: 3.8 mmol/L (ref 3.5–5.1)
Sodium: 140 mmol/L (ref 135–145)

## 2022-06-04 MED ORDER — DIPHENHYDRAMINE HCL 25 MG PO CAPS
25.0000 mg | ORAL_CAPSULE | Freq: Once | ORAL | Status: AC
  Administered 2022-06-04: 25 mg via ORAL
  Filled 2022-06-04: qty 1
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
## 2022-06-04 NOTE — ED Notes (Signed)
Wheeled patient to the bathroom patient did well patient is now sitting up in a recliner back on the monitor with call bell in reach and family at bedside

## 2022-06-04 NOTE — Progress Notes (Signed)
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  In the emergency room, renal functions at about baseline.  Initial troponin 1546-1900.  EKG with nonspecific ST depression.  Due to elevated troponins, patient was started on heparin infusion and admitted to the hospital. Patient was given additional diuresis, caused worsening renal functions.  Remains in the hospital for management.   Assessment & Plan:   Neck pain/back pain, suspected non-STEMI: Pain can be from her fibromyalgia syndrome.  Significantly elevated troponins. We will admit patient to the telemetry unit given severity of symptoms. Currently chest pain-free.  Neck pain improved with oxycodone. Stable EKG and troponins. Supplemental oxygen to keep saturations more than 90%. Aspirin, 81 mg daily. Nitroglycerin, as needed. On heparin infusion. Initially plan for cardiac catheterization, however on hold due to worsening renal functions.  Cardiology following.  Acute kidney injury on CKD stage IIIa: Baseline creatinine 1.6-1.99.  Given additional diuretics yesterday.  Creatinine 2.56.  Urine output is adequate.  Hold dialysis today.  Recheck tomorrow.  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  On maintenance prednisone.  Continue.  Fibromyalgia and chronic pain syndrome, anxiety and depression: On oxycodone, Lyrica and Cymbalta.  Continue.  Paroxysmal A-fib: On sinus rhythm.  Rate controlled.  Metoprolol.  Xarelto replaced by heparin.  Hypothyroidism: On Synthroid.     DVT prophylaxis:   Heparin infusion   Code Status: DNR.  Patient  confirmed. Family Communication: Friend at the bedside. Disposition Plan: Status is: Observation The patient will require care spanning > 2 midnights and should be moved to inpatient because: Persistently elevated troponins, IV heparin for acute coronary syndrome.     Consultants:  Cardiology  Procedures:  None  Antimicrobials:  None   Subjective: Patient seen and examined.  No overnight events.  Remained in the emergency room waiting for inpatient bed.  Friend at the bedside.  Denies any chest pain or shortness of breath, however does have neck pain which is not out of ordinary for her.  Objective: Vitals:   06/04/22 0634 06/04/22 0700 06/04/22 0800 06/04/22 0858  BP: 139/82 135/82 131/80 137/81  Pulse: (!) 57 (!) 57 (!) 58 (!) 59  Resp: '17 14 13 17  '$ Temp:    97.7 F (36.5 C)  TempSrc:    Oral  SpO2: 97% 97% 94% 96%   No intake or output data in the 24 hours ending 06/04/22 1153 There were no vitals filed for this visit.  Examination:  General exam: Appears calm and comfortable  Looks comfortable.  Frail debilitated but not in any distress. Patient does have some palpable tenderness along the nape of the neck. Respiratory system: No added sounds. Cardiovascular system: S1 & S2 heard, RRR. No pedal edema.  Mostly chronic edematous legs. Gastrointestinal system: Abdomen is nondistended, soft and nontender. No organomegaly or masses felt. Normal bowel sounds heard. Central nervous system: Alert and oriented. No focal neurological deficits. Extremities: Symmetric 5 x 5 power. Skin: No rashes, lesions or ulcers Psychiatry: Judgement and insight appear normal. Mood & affect appropriate.     Data Reviewed: I have personally reviewed following labs and imaging studies  CBC: Recent Labs  Lab 06/03/22 1333 06/03/22 1633  06/04/22 0252  WBC 15.2*  --  9.9  NEUTROABS 13.2*  --   --   HGB 12.2 12.2 12.1  HCT 38.0 36.0 37.4  MCV 100.5*  --  100.8*  PLT 175  --  144*    Basic Metabolic Panel: Recent Labs  Lab 06/03/22 1333 06/03/22 1633 06/04/22 0252  NA 145 140 140  K 3.7 3.9 3.8  CL 105  --  104  CO2 27  --  25  GLUCOSE 98  --  139*  BUN 32*  --  41*  CREATININE 1.99*  --  2.56*  CALCIUM 8.3*  --  8.4*   GFR: CrCl cannot be calculated (Unknown ideal weight.). Liver Function Tests: No results for input(s): "AST", "ALT", "ALKPHOS", "BILITOT", "PROT", "ALBUMIN" in the last 168 hours. No results for input(s): "LIPASE", "AMYLASE" in the last 168 hours. No results for input(s): "AMMONIA" in the last 168 hours. Coagulation Profile: No results for input(s): "INR", "PROTIME" in the last 168 hours. Cardiac Enzymes: No results for input(s): "CKTOTAL", "CKMB", "CKMBINDEX", "TROPONINI" in the last 168 hours. BNP (last 3 results) No results for input(s): "PROBNP" in the last 8760 hours. HbA1C: No results for input(s): "HGBA1C" in the last 72 hours. CBG: No results for input(s): "GLUCAP" in the last 168 hours. Lipid Profile: No results for input(s): "CHOL", "HDL", "LDLCALC", "TRIG", "CHOLHDL", "LDLDIRECT" in the last 72 hours. Thyroid Function Tests: No results for input(s): "TSH", "T4TOTAL", "FREET4", "T3FREE", "THYROIDAB" in the last 72 hours. Anemia Panel: No results for input(s): "VITAMINB12", "FOLATE", "FERRITIN", "TIBC", "IRON", "RETICCTPCT" in the last 72 hours. Sepsis Labs: Recent Labs  Lab 06/03/22 1601 06/03/22 2101  LATICACIDVEN 2.7* 1.4    Recent Results (from the past 240 hour(s))  SARS Coronavirus 2 by RT PCR (hospital order, performed in [facility] hospital lab) *cepheid single result test* Anterior Nasal Swab     Status: None   Collection Time: 06/03/22  1:33 PM   Specimen: Anterior Nasal Swab  Result Value Ref Range Status   SARS Coronavirus 2 by RT PCR NEGATIVE NEGATIVE Final    Comment: (NOTE) SARS-CoV-2 target nucleic acids are NOT DETECTED.  The SARS-CoV-2 RNA is generally detectable in upper and lower respiratory  specimens during the acute phase of infection. The lowest concentration of SARS-CoV-2 viral copies this assay can detect is 250 copies / mL. A negative result does not preclude SARS-CoV-2 infection and should not be used as the sole basis for treatment or other patient management decisions.  A negative result may occur with improper specimen collection / handling, submission of specimen other than nasopharyngeal swab, presence of viral mutation(s) within the areas targeted by this assay, and inadequate number of viral copies (<250 copies / mL). A negative result must be combined with clinical observations, patient history, and epidemiological information.  Fact Sheet for Patients:   https://www.patel.info/  Fact Sheet for Healthcare Providers: https://hall.com/  This test is not yet approved or  cleared by the [place] FDA and has been authorized for detection and/or diagnosis of SARS-CoV-2 by FDA under an Emergency Use Authorization (EUA).  This EUA will remain in effect (meaning this test can be used) for the duration of the COVID-19 declaration under Section 564(b)(1) of the Act, 21 U.S.C. section 360bbb-3(b)(1), unless the authorization is terminated or revoked sooner.  Performed at [facility], [place] [address] [place], [place] [address]          Radiology Studies: DG Chest 2 View  Result  Date: 06/03/2022 CLINICAL DATA:  Dyspnea EXAM: CHEST - 2 VIEW COMPARISON:  Radiographs 05/19/2020 FINDINGS: No significant change from radiographs 05/19/2020. Low lung volumes accentuated cardiomediastinal silhouette and pulmonary vascular markings. Cardiomegaly. Left basilar atelectasis. No pleural effusion or pneumothorax. Aortic calcification. IMPRESSION: Stable chest exam.  No interval change or acute process. Electronically Signed   By: Placido Sou M.D.   On: 06/03/2022 14:22        Scheduled Meds:  aspirin EC  81 mg Oral  Daily   cholecalciferol  1,000 Units Oral QPM   cyanocobalamin  500 mcg Oral Q M,W,F   docusate sodium  100 mg Oral BID   DULoxetine  30 mg Oral QHS   levothyroxine  200 mcg Oral QAC breakfast   metoprolol succinate  25 mg Oral Daily   pantoprazole  20 mg Oral Daily   predniSONE  15 mg Oral Daily   pregabalin  75 mg Oral BID   Continuous Infusions:  heparin 1,100 Units/hr (06/04/22 0611)     LOS: 0 days    Time spent: 35 minutes    Barb Merino, MD [facility] Pager [phone]

## 2022-06-04 NOTE — Progress Notes (Addendum)
Progress Note  Patient Name: Nicole Norton Date of Encounter: 06/04/2022  [facility] Cardiologist: Quay Burow, MD   Subjective   No complaints this morning. Breathing has improved. Family at the bedside.   -Still has mild neck and upper back pain but notably improved.  Inpatient Medications    Scheduled Meds:  aspirin EC  81 mg Oral Daily   cholecalciferol  1,000 Units Oral QPM   cyanocobalamin  500 mcg Oral Q M,W,F   docusate sodium  100 mg Oral BID   DULoxetine  30 mg Oral QHS   levothyroxine  200 mcg Oral QAC breakfast   metoprolol succinate  25 mg Oral Daily   pantoprazole  20 mg Oral Daily   predniSONE  15 mg Oral Daily   pregabalin  75 mg Oral BID   Continuous Infusions:  heparin 1,100 Units/hr (06/04/22 0611)   PRN Meds: acetaminophen **OR** acetaminophen, diclofenac Sodium, nitroGLYCERIN, oxyCODONE   Vital Signs    Vitals:   06/04/22 0300 06/04/22 0425 06/04/22 0630 06/04/22 0634  BP: 121/77  139/82 139/82  Pulse: 61   (!) 57  Resp: '17  15 17  '$ Temp:  99 F (37.2 C)    TempSrc:  Oral    SpO2: 97%   97%   No intake or output data in the 24 hours ending 06/04/22 0855    04/28/2022   10:50 AM 03/26/2022   11:47 AM 03/09/2022    1:20 PM  Last 3 Weights  Weight (lbs) 239 lb 6.4 oz 242 lb 234 lb  Weight (kg) 108.591 kg 109.77 kg 106.142 kg      Telemetry    Sinus Rhythm, PACs - Personally Reviewed  ECG    No new tracing  Physical Exam   GEN: Obese older female, sitting up in bed.   Neck: No JVD Cardiac: RRR, no murmurs, rubs, or gallops.  Respiratory: Clear to auscultation bilaterally. GI: Soft, nontender, non-distended  MS: No edema; No deformity. Neuro:  Nonfocal  Psych: Normal affect   Labs    High Sensitivity Troponin:   Recent Labs  Lab 06/03/22 1333 06/03/22 1601  TROPONINIHS 1,546* 1,942*     Chemistry Recent Labs  Lab 06/03/22 1333 06/03/22 1633 06/04/22 0252  NA 145 140 140  K 3.7 3.9 3.8  CL 105  --  104   CO2 27  --  25  GLUCOSE 98  --  139*  BUN 32*  --  41*  CREATININE 1.99*  --  2.56*  CALCIUM 8.3*  --  8.4*  GFRNONAA 25*  --  19*  ANIONGAP 13  --  11    Lipids No results for input(s): "CHOL", "TRIG", "HDL", "LABVLDL", "LDLCALC", "CHOLHDL" in the last 168 hours.  Hematology Recent Labs  Lab 06/03/22 1333 06/03/22 1633 06/04/22 0252  WBC 15.2*  --  9.9  RBC 3.78*  --  3.71*  HGB 12.2 12.2 12.1  HCT 38.0 36.0 37.4  MCV 100.5*  --  100.8*  MCH 32.3  --  32.6  MCHC 32.1  --  32.4  RDW 14.6  --  14.9  PLT 175  --  144*   Thyroid No results for input(s): "TSH", "FREET4" in the last 168 hours.  BNP Recent Labs  Lab 06/03/22 1333  BNP 843.0*    DDimer No results for input(s): "DDIMER" in the last 168 hours.   Radiology    DG Chest 2 View  Result Date: 06/03/2022 CLINICAL DATA:  Dyspnea  EXAM: CHEST - 2 VIEW COMPARISON:  Radiographs 05/19/2020 FINDINGS: No significant change from radiographs 05/19/2020. Low lung volumes accentuated cardiomediastinal silhouette and pulmonary vascular markings. Cardiomegaly. Left basilar atelectasis. No pleural effusion or pneumothorax. Aortic calcification. IMPRESSION: Stable chest exam.  No interval change or acute process. Electronically Signed   By: Placido Sou M.D.   On: 06/03/2022 14:22    Cardiac Studies   Echo: pending  Patient Profile     81 y.o. 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 Langston Masker.  Assessment & Plan    NSTEMI Neck pain --Presented with severe neck pain this morning that awoke her from sleep along with dyspnea.  Initial high-sensitivity troponin 1546 => 1942 .  EKG shows sinus rhythm  with PACs, slight lateral ST depression with minimal ST elevation in aVR.  Suspect her neck pain could be her anginal equivalent.   --Holding Xarelto, continue IV heparin x 72 hrs (reassess symptoms after completion of infusion, would you symptomatology to determine plus or minus catheterization at this point or not) --May ultimately need cardiac catheterization, but ideally would like to see creatinine improve. Unfortunately her Cr increased from 1.9>>2.5 this morning therefore will defer cath for now. Follow up echo results for further recommendations.  -- continue ASA -> (could consider converting to Plavix along with DOAC if the plan is to simply do medical management) -- recheck EKG this morning   HFpEF: Previous echocardiogram in 2021 with LVEF of 50 to 67%, grade 2 diastolic dysfunction, normal RV function, mild MR. BNP elevated 843.  On Bumex as an outpatient.  Difficult to assess volume status, but reports significant UOP overnight. No I&Os documented. Will increase in Cr, will hold additional diuresis for now.  -- Echo pending  Paroxysmal atrial fibrillation: Currently in sinus rhythm with PACs --As above holding Xarelto, continue IV heparin -depending on echocardiogram results and symptoms, may or may not decide to proceed with catheterization during this hospitalization.  If plan is to forego catheterization, then would reinitiate Xarelto and consider adding short-term Plavix given ACS presentation.   Hypotension: Initially in the 80s on EMS arrival.  She has had some pressures in the 67-20 range systolic while in the ED. Blood pressures improved this morning. -- Toprol resumed    Hyperlipidemia: statin intolerant   CKD IIIb: baseline Cr appears around 1.5-1.6, up to 1.9 on admission. Up to 2.5 this morning -- follow BMET   Interstitial lung disease/EGPA Chronically O2 dependent -- follows with pulmonary as an outpatient, normally uses 2 L at baseline --On prednisone 15 mg  daily   For questions or updates, please contact [place] Please consult [email or web address] for contact info under        Signed, Reino Bellis, NP  06/04/2022, 8:55 AM    ATTENDING ATTESTATION  I have seen, examined and evaluated the patient this AM along with Reino Bellis, NP-C.  After reviewing all the available data and chart, we discussed the patients laboratory, study & physical findings as well as symptoms in detail. I agree with her and normal findings, examination as well as impression recommendations as per our discussion.    Attending adjustments noted in italics.   Feels much better today.  Unfortunate renal function went the wrong direction  with creatinine of up to 2.5. => Cardiac cath plans on hold for today.  This actually gives her more time to consider whether or not she wants to proceed with invasive evaluation. We will hold for diuresis, follow-up echocardiogram results to determine volume status.  Plan will be to continue IV heparin for 72 hours and make a decision closure to completion of the course based on renal function and symptom status whether not we proceed with catheterization during this hospitalization or at a later date to allow for time for renal recovery.  If plan is to forego cardiac catheterization, would potentially consider stopping aspirin and switching to Plavix along with DOAC. Continue beta-blocker.  Statin intolerant, therefore may need to consider supplemental agent. - check FLP in AM   Glenetta Hew, M.D., M.S. Interventional Cardiologist   Pager # [phone] Phone # [phone] [address] [place], [place] [address]  You Elwyn Reach
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
## 2022-06-04 NOTE — Progress Notes (Signed)
  Echocardiogram 2D Echocardiogram has been performed.  Nicole Norton 06/04/2022, 1:12 PM

## 2022-06-04 NOTE — ED Notes (Signed)
Pt placed on hospital bed

## 2022-06-04 NOTE — ED Notes (Signed)
Help patient up to the bedside toilet patient has call bell in reach
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
## 2022-06-05 DIAGNOSIS — I214 Non-ST elevation (NSTEMI) myocardial infarction: Secondary | ICD-10-CM | POA: Diagnosis not present

## 2022-06-05 LAB — BASIC METABOLIC PANEL
Anion gap: 9 (ref 5–15)
BUN: 36 mg/dL — ABNORMAL HIGH (ref 8–23)
CO2: 27 mmol/L (ref 22–32)
Calcium: 8.8 mg/dL — ABNORMAL LOW (ref 8.9–10.3)
Chloride: 104 mmol/L (ref 98–111)
Creatinine, Ser: 1.79 mg/dL — ABNORMAL HIGH (ref 0.44–1.00)
GFR, Estimated: 29 mL/min — ABNORMAL LOW (ref >60)
Glucose, Bld: 96 mg/dL (ref 70–99)
Potassium: 3.5 mmol/L (ref 3.5–5.1)
Sodium: 140 mmol/L (ref 135–145)

## 2022-06-05 LAB — LIPID PANEL
Cholesterol: 264 mg/dL — ABNORMAL HIGH (ref 0–200)
HDL: 60 mg/dL (ref >40)
LDL Cholesterol: 145 mg/dL — ABNORMAL HIGH (ref 0–99)
Total CHOL/HDL Ratio: 4.4 RATIO
Triglycerides: 294 mg/dL — ABNORMAL HIGH (ref <150)
VLDL: 59 mg/dL — ABNORMAL HIGH (ref 0–40)

## 2022-06-05 LAB — CBC
HCT: 35.7 % — ABNORMAL LOW (ref 36.0–46.0)
Hemoglobin: 11.6 g/dL — ABNORMAL LOW (ref 12.0–15.0)
MCH: 32.8 pg (ref 26.0–34.0)
MCHC: 32.5 g/dL (ref 30.0–36.0)
MCV: 100.8 fL — ABNORMAL HIGH (ref 80.0–100.0)
Platelets: 135 10*3/uL — ABNORMAL LOW (ref 150–400)
RBC: 3.54 MIL/uL — ABNORMAL LOW (ref 3.87–5.11)
RDW: 14.6 % (ref 11.5–15.5)
WBC: 6.4 10*3/uL (ref 4.0–10.5)
nRBC: 0 % (ref 0.0–0.2)

## 2022-06-05 LAB — APTT
aPTT: 82 seconds — ABNORMAL HIGH (ref 24–36)
aPTT: 63 seconds — ABNORMAL HIGH (ref 24–36)
aPTT: 69 seconds — ABNORMAL HIGH (ref 24–36)

## 2022-06-05 LAB — HEPARIN LEVEL (UNFRACTIONATED): Heparin Unfractionated: 0.82 IU/mL — ABNORMAL HIGH (ref 0.30–0.70)

## 2022-06-05 MED ORDER — DIPHENHYDRAMINE HCL 25 MG PO CAPS
25.0000 mg | ORAL_CAPSULE | Freq: Every evening | ORAL | Status: DC | PRN
Start: 2022-06-05 — End: 2022-06-07
  Administered 2022-06-05 – 2022-06-06 (×2): 25 mg via ORAL
  Filled 2022-06-05 (×2): qty 1

## 2022-06-05 MED ORDER — FENTANYL 12 MCG/HR TD PT72
1.0000 | MEDICATED_PATCH | TRANSDERMAL | Status: DC
  Administered 2022-06-05: 1 via TRANSDERMAL
  Filled 2022-06-05: qty 1
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
## 2022-06-05 NOTE — Progress Notes (Signed)
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  In the emergency room, renal functions at about baseline.  Initial troponin [id number].  EKG with nonspecific ST depression.  Due to elevated troponins, patient was started on heparin infusion and admitted to the hospital. Patient was given additional diuresis, caused worsening renal functions.  Remains in the hospital for management.   Assessment & Plan:   Neck pain/back pain, suspected non-STEMI: Significantly elevated troponins. Currently chest pain-free.  Neck pain improved with oxycodone. Stable EKG and troponins. Supplemental oxygen to keep saturations more than 90%. Aspirin, 81 mg daily. Nitroglycerin, as needed. On heparin infusion. Cardiology following.  Possible cardiac cath during hospitalization.  Acute kidney injury on CKD stage IIIa: Baseline creatinine 1.6-1.99-2.56-1.79.   Renal functions trending down.  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  On maintenance prednisone.  Continue.  Fibromyalgia and chronic pain syndrome, anxiety and depression: On oxycodone, Lyrica and Cymbalta.  At home dose of fentanyl patch to reduce use of short-acting oxycodone.  Pain much improved today.  Paroxysmal A-fib: On sinus rhythm.  Rate controlled.  Metoprolol.  Xarelto replaced by heparin.  Hypothyroidism: On Synthroid.     DVT prophylaxis:   Heparin infusion   Code Status: DNR.  Patient confirmed. Family Communication: None today. Disposition Plan: Status is: Inpatient.  Remains inpatient as cardiac procedures planned.   Consultants:   Cardiology  Procedures:  None  Antimicrobials:  None   Subjective:  Seen and examined.  No overnight events.  Neck pain is better.  Objective: Vitals:   06/04/22 1442 06/04/22 2000 06/04/22 2105 06/05/22 0536  BP: 112/73  (!) 173/92 (!) 154/78  Pulse:  66 68 60  Resp: '18  20 20  '$ Temp: 98.2 F (36.8 C)  98 F (36.7 C) 97.6 F (36.4 C)  TempSrc: Oral  Oral Oral  SpO2: 91% 98% 98% 98%  Weight: 109.4 kg   109.5 kg  Height: '5\' 6"'$  (1.676 m)       Intake/Output Summary (Last 24 hours) at 06/05/2022 1111 Last data filed at 06/05/2022 0600 Gross per 24 hour  Intake 333 ml  Output --  Net 333 ml   Filed Weights   06/04/22 1442 06/05/22 0536  Weight: 109.4 kg 109.5 kg    Examination:  General: Alert oriented.  Comfortable.  Chronically sick looking but not in distress. Cardiovascular: S1-S2 normal.  Regular rate rhythm. Respiratory: Bilateral clear.  On 2 L oxygen. Gastrointestinal: Soft.  Nontender.  Obese and pendulous. Ext: No edema. Neuro: Intact. Musculoskeletal: No deformities.     Data Reviewed: I have personally reviewed following labs and imaging studies  CBC: Recent Labs  Lab 06/03/22 1333 06/03/22 1633 06/04/22 0252 06/05/22 0612  WBC 15.2*  --  9.9 6.4  NEUTROABS 13.2*  --   --   --   HGB 12.2 12.2 12.1 11.6*  HCT 38.0 36.0 37.4 35.7*  MCV 100.5*  --  100.8* 100.8*  PLT 175  --  144* 135*    Basic Metabolic Panel: Recent Labs  Lab 06/03/22 1333 06/03/22 1633 06/04/22 0252 06/05/22 0612  NA 145 140 140 140  K 3.7 3.9 3.8 3.5  CL 105  --  104 104  CO2 27  --  25 27  GLUCOSE 98  --  139* 96  BUN 32*  --  41* 36*  CREATININE 1.99*  --  2.56* 1.79*  CALCIUM 8.3*  --  8.4* 8.8*    GFR: Estimated Creatinine Clearance: 32.5 mL/min (A) (by C-G formula based on SCr of 1.79 mg/dL (H)). Liver Function Tests: No results for input(s): "AST", "ALT", "ALKPHOS", "BILITOT", "PROT", "ALBUMIN" in the last 168 hours. No results for input(s):  "LIPASE", "AMYLASE" in the last 168 hours. No results for input(s): "AMMONIA" in the last 168 hours. Coagulation Profile: No results for input(s): "INR", "PROTIME" in the last 168 hours. Cardiac Enzymes: No results for input(s): "CKTOTAL", "CKMB", "CKMBINDEX", "TROPONINI" in the last 168 hours. BNP (last 3 results) No results for input(s): "PROBNP" in the last 8760 hours. HbA1C: No results for input(s): "HGBA1C" in the last 72 hours. CBG: No results for input(s): "GLUCAP" in the last 168 hours. Lipid Profile: Recent Labs    06/05/22 0612  CHOL 264*  HDL 60  LDLCALC 145*  TRIG 294*  CHOLHDL 4.4   Thyroid Function Tests: No results for input(s): "TSH", "T4TOTAL", "FREET4", "T3FREE", "THYROIDAB" in the last 72 hours. Anemia Panel: No results for input(s): "VITAMINB12", "FOLATE", "FERRITIN", "TIBC", "IRON", "RETICCTPCT" in the last 72 hours. Sepsis Labs: Recent Labs  Lab 06/03/22 1601 06/03/22 2101  LATICACIDVEN 2.7* 1.4     Recent Results (from the past 240 hour(s))  SARS Coronavirus 2 by RT PCR (hospital order, performed in [facility] hospital lab) *cepheid single result test* Anterior Nasal Swab     Status: None   Collection Time: 06/03/22  1:33 PM   Specimen: Anterior Nasal Swab  Result Value Ref Range Status   SARS Coronavirus 2 by RT PCR NEGATIVE NEGATIVE Final    Comment: (NOTE) SARS-CoV-2 target nucleic acids are NOT DETECTED.  The SARS-CoV-2 RNA is generally detectable in upper and lower respiratory specimens during the acute phase of infection. The lowest concentration of SARS-CoV-2 viral copies this assay can detect is 250 copies / mL. A negative result does not preclude SARS-CoV-2 infection and should not be used as the sole basis for treatment or other patient management decisions.  A negative result may occur with improper specimen collection / handling, submission of specimen other than nasopharyngeal swab, presence of viral mutation(s) within the areas  targeted by this assay, and inadequate number of viral copies (<250 copies / mL). A negative result must be combined with clinical observations, patient history, and epidemiological information.  Fact Sheet for Patients:   https://www.patel.info/  Fact Sheet for Healthcare Providers: https://hall.com/  This test is not yet approved or  cleared by the [place] FDA and has been authorized for detection and/or diagnosis of SARS-CoV-2 by FDA under an Emergency Use Authorization (EUA).  This EUA will remain in effect (meaning this test can be used) for the duration of the COVID-19 declaration under Section 564(b)(1) of the Act, 21 U.S.C. section 360bbb-3(b)(1), unless the authorization is terminated or revoked sooner.  Performed at [facility], [place] [address] [place], [place] [address]          Radiology Studies: ECHOCARDIOGRAM COMPLETE  Result Date: 06/04/2022    ECHOCARDIOGRAM REPORT   Patient Name:   Nicole Norton Date of Exam: 06/04/2022 Medical Rec #:  [id number]      Height:       66.0 in Accession #:    [id number]  Weight:       239.4 lb Date of Birth:  1941/02/08      BSA:          2.159 m Patient Age:    32 years       BP:           93/75 mmHg Patient Gender: F              HR:           69 bpm. Exam Location:  Inpatient Procedure: 2D Echo, Cardiac Doppler and Color Doppler Indications:    Chest pain  History:        Patient has prior history of Echocardiogram examinations, most                 recent 05/20/2020. Previous Myocardial Infarction,                 Arrythmias:Atrial Fibrillation and Atrial Flutter; Risk                 Factors:Dyslipidemia, Hypertension and Obesity. Thyroid disease.  Sonographer:    Eartha Inch Referring Phys: [id number] Selso Mannor  Sonographer Comments: Image acquisition challenging due to respiratory motion. IMPRESSIONS  1. Left ventricular ejection fraction, by estimation, is 60 to 65%. The  left ventricle has normal function. The left ventricle has no regional wall motion abnormalities. Left ventricular diastolic parameters are consistent with Grade II diastolic dysfunction (pseudonormalization). Elevated left ventricular end-diastolic pressure.  2. Right ventricular systolic function is normal. The right ventricular size is normal. There is normal pulmonary artery systolic pressure.  3. Left atrial size was severely dilated.  4. The mitral valve is abnormal. Trivial mitral valve regurgitation. No evidence of mitral stenosis. Moderate mitral annular calcification.  5. Tricuspid valve regurgitation is moderate.  6. The aortic valve is normal in structure. There is mild calcification of the aortic valve. There is mild thickening of the aortic valve. Aortic valve regurgitation is trivial. Aortic valve sclerosis is present, with no evidence of aortic valve stenosis.  7. The inferior vena cava is normal in size with <50% respiratory variability, suggesting right atrial pressure of 8 mmHg. FINDINGS  Left Ventricle: Left ventricular ejection fraction, by estimation, is 60 to 65%. The left ventricle has normal function. The left ventricle has no regional wall motion abnormalities. The left ventricular internal cavity size was normal in size. There is  no left ventricular hypertrophy. Left ventricular diastolic parameters are consistent with Grade II diastolic dysfunction (pseudonormalization). Elevated left ventricular end-diastolic pressure. Right Ventricle: The right ventricular size is normal. No increase in right ventricular wall thickness. Right ventricular systolic function is normal. There is normal pulmonary artery systolic pressure. The tricuspid regurgitant velocity is 2.71 m/s, and  with an assumed right atrial pressure of 3 mmHg, the estimated right ventricular systolic pressure is 99.2 mmHg. Left Atrium: Left atrial size was severely dilated. Right Atrium: Right atrial size was normal in size.  Pericardium: There is no evidence of pericardial effusion. Mitral Valve: The mitral valve is abnormal. There is mild thickening of the mitral valve leaflet(s). There is mild calcification of the mitral valve leaflet(s). Moderate mitral annular calcification. Trivial mitral valve regurgitation. No evidence of mitral valve stenosis. Tricuspid Valve: The tricuspid valve is normal in structure. Tricuspid valve regurgitation is moderate . No evidence of tricuspid stenosis. Aortic Valve: The aortic valve is normal in structure. There is mild calcification of the aortic valve. There is mild thickening of the aortic  valve. Aortic valve regurgitation is trivial. Aortic valve sclerosis is present, with no evidence of aortic valve stenosis. Pulmonic Valve: The pulmonic valve was normal in structure. Pulmonic valve regurgitation is not visualized. No evidence of pulmonic stenosis. Aorta: The aortic root is normal in size and structure. Venous: The inferior vena cava is normal in size with less than 50% respiratory variability, suggesting right atrial pressure of 8 mmHg. IAS/Shunts: No atrial level shunt detected by color flow Doppler.  LEFT VENTRICLE PLAX 2D LVIDd:         5.30 cm   Diastology LVIDs:         3.30 cm   LV e' medial:    4.64 cm/s LV PW:         1.00 cm   LV E/e' medial:  22.8 LV IVS:        1.00 cm   LV e' lateral:   6.31 cm/s LVOT diam:     1.90 cm   LV E/e' lateral: 16.8 LVOT Area:     2.84 cm  RIGHT VENTRICLE             IVC RV S prime:     11.00 cm/s  IVC diam: 1.80 cm TAPSE (M-mode): 1.8 cm LEFT ATRIUM           Index        RIGHT ATRIUM           Index LA diam:      5.10 cm 2.36 cm/m   RA Area:     17.00 cm LA Vol (A2C): 80.0 ml 37.06 ml/m  RA Volume:   48.90 ml  22.65 ml/m   AORTA Ao Root diam: 3.10 cm Ao Asc diam:  3.70 cm MITRAL VALVE                TRICUSPID VALVE MV Area (PHT): 5.27 cm     TR Peak grad:   29.4 mmHg MV Decel Time: 144 msec     TR Vmax:        271.00 cm/s MV E velocity: 105.70 cm/s  MV A velocity: 44.00 cm/s   SHUNTS MV E/A ratio:  2.40         Systemic Diam: 1.90 cm Jenkins Rouge MD Electronically signed by Jenkins Rouge MD Signature Date/Time: 06/04/2022/2:27:09 PM    Final    DG Chest 2 View  Result Date: 06/03/2022 CLINICAL DATA:  Dyspnea EXAM: CHEST - 2 VIEW COMPARISON:  Radiographs 05/19/2020 FINDINGS: No significant change from radiographs 05/19/2020. Low lung volumes accentuated cardiomediastinal silhouette and pulmonary vascular markings. Cardiomegaly. Left basilar atelectasis. No pleural effusion or pneumothorax. Aortic calcification. IMPRESSION: Stable chest exam.  No interval change or acute process. Electronically Signed   By: Placido Sou M.D.   On: 06/03/2022 14:22        Scheduled Meds:  aspirin EC  81 mg Oral Daily   cholecalciferol  1,000 Units Oral QPM   cyanocobalamin  500 mcg Oral Q M,W,F   docusate sodium  100 mg Oral BID   DULoxetine  30 mg Oral QHS   fentaNYL  1 patch Transdermal Q72H   levothyroxine  200 mcg Oral QAC breakfast   metoprolol succinate  25 mg Oral Daily   pantoprazole  20 mg Oral Daily   predniSONE  15 mg Oral Daily   pregabalin  75 mg Oral BID   Continuous Infusions:  heparin 1,300 Units/hr (06/04/22 2230)     LOS: 1 day  Time spent: 35 minutes    Barb Merino, MD [facility] Pager [phone]

## 2022-06-05 NOTE — Progress Notes (Signed)
Progress Note  Patient Name: Nicole Norton Date of Encounter: 06/05/2022  [facility] Cardiologist: Quay Burow, MD   Subjective   NAEO. Renal function improving. Not wearing O2 this AM.  Inpatient Medications    Scheduled Meds:  aspirin EC  81 mg Oral Daily   cholecalciferol  1,000 Units Oral QPM   cyanocobalamin  500 mcg Oral Q M,W,F   docusate sodium  100 mg Oral BID   DULoxetine  30 mg Oral QHS   fentaNYL  1 patch Transdermal Q72H   levothyroxine  200 mcg Oral QAC breakfast   metoprolol succinate  25 mg Oral Daily   pantoprazole  20 mg Oral Daily   predniSONE  15 mg Oral Daily   pregabalin  75 mg Oral BID   Continuous Infusions:  heparin 1,300 Units/hr (06/05/22 1153)   PRN Meds: acetaminophen **OR** acetaminophen, diclofenac Sodium, nitroGLYCERIN, oxyCODONE   Vital Signs    Vitals:   06/04/22 1442 06/04/22 2000 06/04/22 2105 06/05/22 0536  BP: 112/73  (!) 173/92 (!) 154/78  Pulse:  66 68 60  Resp: '18  20 20  '$ Temp: 98.2 F (36.8 C)  98 F (36.7 C) 97.6 F (36.4 C)  TempSrc: Oral  Oral Oral  SpO2: 91% 98% 98% 98%  Weight: 109.4 kg   109.5 kg  Height: '5\' 6"'$  (1.676 m)       Intake/Output Summary (Last 24 hours) at 06/05/2022 1241 Last data filed at 06/05/2022 0600 Gross per 24 hour  Intake 333 ml  Output --  Net 333 ml      06/05/2022    5:36 AM 06/04/2022    2:42 PM 04/28/2022   10:50 AM  Last 3 Weights  Weight (lbs) 241 lb 4.8 oz 241 lb 2.9 oz 239 lb 6.4 oz  Weight (kg) 109.453 kg 109.4 kg 108.591 kg      Telemetry    Personally Reviewed  ECG    Personally Reviewed  Physical Exam   GEN: No acute distress.  Obese. Neck: No JVD Cardiac: RRR, no murmurs, rubs, or gallops.  Respiratory: Clear to auscultation bilaterally. GI: Soft, nontender, non-distended  MS: No edema; No deformity. Neuro:  Nonfocal  Psych: Normal affect   Labs    High Sensitivity Troponin:   Recent Labs  Lab 06/03/22 1333 06/03/22 1601  TROPONINIHS  1,546* 1,942*     Chemistry Recent Labs  Lab 06/03/22 1333 06/03/22 1633 06/04/22 0252 06/05/22 0612  NA 145 140 140 140  K 3.7 3.9 3.8 3.5  CL 105  --  104 104  CO2 27  --  25 27  GLUCOSE 98  --  139* 96  BUN 32*  --  41* 36*  CREATININE 1.99*  --  2.56* 1.79*  CALCIUM 8.3*  --  8.4* 8.8*  GFRNONAA 25*  --  19* 29*  ANIONGAP 13  --  11 9    Lipids  Recent Labs  Lab 06/05/22 0612  CHOL 264*  TRIG 294*  HDL 60  LDLCALC 145*  CHOLHDL 4.4    Hematology Recent Labs  Lab 06/03/22 1333 06/03/22 1633 06/04/22 0252 06/05/22 0612  WBC 15.2*  --  9.9 6.4  RBC 3.78*  --  3.71* 3.54*  HGB 12.2 12.2 12.1 11.6*  HCT 38.0 36.0 37.4 35.7*  MCV 100.5*  --  100.8* 100.8*  MCH 32.3  --  32.6 32.8  MCHC 32.1  --  32.4 32.5  RDW 14.6  --  14.9 14.6  PLT 175  --  144* 135*   Thyroid No results for input(s): "TSH", "FREET4" in the last 168 hours.  BNP Recent Labs  Lab 06/03/22 1333  BNP 843.0*    DDimer No results for input(s): "DDIMER" in the last 168 hours.   Radiology    ECHOCARDIOGRAM COMPLETE  Result Date: 06/04/2022    ECHOCARDIOGRAM REPORT   Patient Name:   Nicole Norton Date of Exam: 06/04/2022 Medical Rec #:  [id number]      Height:       66.0 in Accession #:    [id number]     Weight:       239.4 lb Date of Birth:  [birth year]      BSA:          2.159 m Patient Age:    81 years       BP:           93/75 mmHg Patient Gender: F              HR:           69 bpm. Exam Location:  Inpatient Procedure: 2D Echo, Cardiac Doppler and Color Doppler Indications:    Chest pain  History:        Patient has prior history of Echocardiogram examinations, most                 recent 05/20/2020. Previous Myocardial Infarction,                 Arrythmias:Atrial Fibrillation and Atrial Flutter; Risk                 Factors:Dyslipidemia, Hypertension and Obesity. Thyroid disease.  Sonographer:    Eartha Inch Referring Phys: [id number] GHIMIRE  Sonographer Comments: Image acquisition  challenging due to respiratory motion. IMPRESSIONS  1. Left ventricular ejection fraction, by estimation, is 60 to 65%. The left ventricle has normal function. The left ventricle has no regional wall motion abnormalities. Left ventricular diastolic parameters are consistent with Grade II diastolic dysfunction (pseudonormalization). Elevated left ventricular end-diastolic pressure.  2. Right ventricular systolic function is normal. The right ventricular size is normal. There is normal pulmonary artery systolic pressure.  3. Left atrial size was severely dilated.  4. The mitral valve is abnormal. Trivial mitral valve regurgitation. No evidence of mitral stenosis. Moderate mitral annular calcification.  5. Tricuspid valve regurgitation is moderate.  6. The aortic valve is normal in structure. There is mild calcification of the aortic valve. There is mild thickening of the aortic valve. Aortic valve regurgitation is trivial. Aortic valve sclerosis is present, with no evidence of aortic valve stenosis.  7. The inferior vena cava is normal in size with <50% respiratory variability, suggesting right atrial pressure of 8 mmHg. FINDINGS  Left Ventricle: Left ventricular ejection fraction, by estimation, is 60 to 65%. The left ventricle has normal function. The left ventricle has no regional wall motion abnormalities. The left ventricular internal cavity size was normal in size. There is  no left ventricular hypertrophy. Left ventricular diastolic parameters are consistent with Grade II diastolic dysfunction (pseudonormalization). Elevated left ventricular end-diastolic pressure. Right Ventricle: The right ventricular size is normal. No increase in right ventricular wall thickness. Right ventricular systolic function is normal. There is normal pulmonary artery systolic pressure. The tricuspid regurgitant velocity is 2.71 m/s, and  with an assumed right atrial pressure of 3 mmHg, the estimated right ventricular systolic  pressure is 23.5 mmHg. Left Atrium: Left  atrial size was severely dilated. Right Atrium: Right atrial size was normal in size. Pericardium: There is no evidence of pericardial effusion. Mitral Valve: The mitral valve is abnormal. There is mild thickening of the mitral valve leaflet(s). There is mild calcification of the mitral valve leaflet(s). Moderate mitral annular calcification. Trivial mitral valve regurgitation. No evidence of mitral valve stenosis. Tricuspid Valve: The tricuspid valve is normal in structure. Tricuspid valve regurgitation is moderate . No evidence of tricuspid stenosis. Aortic Valve: The aortic valve is normal in structure. There is mild calcification of the aortic valve. There is mild thickening of the aortic valve. Aortic valve regurgitation is trivial. Aortic valve sclerosis is present, with no evidence of aortic valve stenosis. Pulmonic Valve: The pulmonic valve was normal in structure. Pulmonic valve regurgitation is not visualized. No evidence of pulmonic stenosis. Aorta: The aortic root is normal in size and structure. Venous: The inferior vena cava is normal in size with less than 50% respiratory variability, suggesting right atrial pressure of 8 mmHg. IAS/Shunts: No atrial level shunt detected by color flow Doppler.  LEFT VENTRICLE PLAX 2D LVIDd:         5.30 cm   Diastology LVIDs:         3.30 cm   LV e' medial:    4.64 cm/s LV PW:         1.00 cm   LV E/e' medial:  22.8 LV IVS:        1.00 cm   LV e' lateral:   6.31 cm/s LVOT diam:     1.90 cm   LV E/e' lateral: 16.8 LVOT Area:     2.84 cm  RIGHT VENTRICLE             IVC RV S prime:     11.00 cm/s  IVC diam: 1.80 cm TAPSE (M-mode): 1.8 cm LEFT ATRIUM           Index        RIGHT ATRIUM           Index LA diam:      5.10 cm 2.36 cm/m   RA Area:     17.00 cm LA Vol (A2C): 80.0 ml 37.06 ml/m  RA Volume:   48.90 ml  22.65 ml/m   AORTA Ao Root diam: 3.10 cm Ao Asc diam:  3.70 cm MITRAL VALVE                TRICUSPID VALVE MV Area  (PHT): 5.27 cm     TR Peak grad:   29.4 mmHg MV Decel Time: 144 msec     TR Vmax:        271.00 cm/s MV E velocity: 105.70 cm/s MV A velocity: 44.00 cm/s   SHUNTS MV E/A ratio:  2.40         Systemic Diam: 1.90 cm Jenkins Rouge MD Electronically signed by Jenkins Rouge MD Signature Date/Time: 06/04/2022/2:27:09 PM    Final    DG Chest 2 View  Result Date: 06/03/2022 CLINICAL DATA:  Dyspnea EXAM: CHEST - 2 VIEW COMPARISON:  Radiographs 05/19/2020 FINDINGS: No significant change from radiographs 05/19/2020. Low lung volumes accentuated cardiomediastinal silhouette and pulmonary vascular markings. Cardiomegaly. Left basilar atelectasis. No pleural effusion or pneumothorax. Aortic calcification. IMPRESSION: Stable chest exam.  No interval change or acute process. Electronically Signed   By: Placido Sou M.D.   On: 06/03/2022 14:22      Assessment & Plan     #NSTEMI Cont heparin  Following renal function closely for timing of LHC Cont asa  #HFpEF Echo with normal LV function  #pAF Cont IV heparin On xarelto as outpatient.    For questions or updates, please contact [place] Please consult [email or web address] for contact info under        Signed, Vickie Epley, MD  06/05/2022, 12:41 PM

## 2022-06-06 DIAGNOSIS — I214 Non-ST elevation (NSTEMI) myocardial infarction: Secondary | ICD-10-CM | POA: Diagnosis not present

## 2022-06-06 LAB — CBC
HCT: 34.5 % — ABNORMAL LOW (ref 36.0–46.0)
Hemoglobin: 11 g/dL — ABNORMAL LOW (ref 12.0–15.0)
MCH: 32.2 pg (ref 26.0–34.0)
MCHC: 31.9 g/dL (ref 30.0–36.0)
MCV: 100.9 fL — ABNORMAL HIGH (ref 80.0–100.0)
Platelets: 145 10*3/uL — ABNORMAL LOW (ref 150–400)
RBC: 3.42 MIL/uL — ABNORMAL LOW (ref 3.87–5.11)
RDW: 14.6 % (ref 11.5–15.5)
WBC: 5.6 10*3/uL (ref 4.0–10.5)
nRBC: 0 % (ref 0.0–0.2)

## 2022-06-06 LAB — BASIC METABOLIC PANEL
Anion gap: 8 (ref 5–15)
BUN: 19 mg/dL (ref 8–23)
CO2: 27 mmol/L (ref 22–32)
Calcium: 9.1 mg/dL (ref 8.9–10.3)
Chloride: 109 mmol/L (ref 98–111)
Creatinine, Ser: 1.34 mg/dL — ABNORMAL HIGH (ref 0.44–1.00)
GFR, Estimated: 41 mL/min — ABNORMAL LOW (ref >60)
Glucose, Bld: 85 mg/dL (ref 70–99)
Potassium: 3.6 mmol/L (ref 3.5–5.1)
Sodium: 144 mmol/L (ref 135–145)

## 2022-06-06 LAB — APTT: aPTT: 88 seconds — ABNORMAL HIGH (ref 24–36)

## 2022-06-06 LAB — HEPARIN LEVEL (UNFRACTIONATED): Heparin Unfractionated: 0.65 IU/mL (ref 0.30–0.70)

## 2022-06-06 MED ORDER — AMLODIPINE BESYLATE 5 MG PO TABS: 5.0000 mg | ORAL_TABLET | Freq: Every day | ORAL | Status: DC

## 2022-06-06 MED ORDER — OXYCODONE HCL 5 MG PO TABS: 5.0000 mg | ORAL_TABLET | Freq: Three times a day (TID) | ORAL | Status: DC | PRN

## 2022-06-06 MED ORDER — HYDRALAZINE HCL 25 MG PO TABS
25.0000 mg | ORAL_TABLET | Freq: Three times a day (TID) | ORAL | Status: DC | PRN
  Administered 2022-06-07: 25 mg via ORAL
  Filled 2022-06-06: qty 1

## 2022-06-06 NOTE — Progress Notes (Signed)
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  In the emergency room, renal functions at about baseline.  Initial troponin [id number].  EKG with nonspecific ST depression.  Due to elevated troponins, patient was started on heparin infusion and admitted to the hospital. Patient was given additional diuresis, caused worsening renal functions.  Remains in the hospital for management. Plan for cardiac cath tomorrow.   Assessment & Plan:   Neck pain/back pain, suspected non-STEMI: Significantly elevated troponins. Currently chest pain-free.  Neck pain improved with oxycodone and fentanyl. Stable EKG and troponins. Supplemental oxygen to keep saturations more than 90%. Aspirin, 81 mg daily. Nitroglycerin, as needed. On heparin infusion. Cardiology following.  Schedule cardiac cath tomorrow.  Acute kidney injury on CKD stage IIIa: Baseline creatinine 1.6-1.99-2.56-1.79-1.3. Renal functions trending down and is stabilizing.  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  On maintenance prednisone.  Continue.  Fibromyalgia and chronic pain syndrome, anxiety and depression: On oxycodone, Lyrica and Cymbalta.  At home dose of fentanyl patch to reduce use of short-acting oxycodone.  Pain much improved today.  Paroxysmal A-fib: On sinus rhythm.  Rate controlled.  Metoprolol.  Xarelto replaced by heparin.  Hypothyroidism: On Synthroid.     DVT prophylaxis:   Heparin infusion   Code Status: DNR.  Patient confirmed. Family Communication: None. Disposition Plan: Status is: Inpatient.  Remains inpatient as cardiac  procedures planned.   Consultants:  Cardiology  Procedures:  None  Antimicrobials:  None   Subjective:  No new events.  Pain is better controlled after using fentanyl patch that she uses at home.  Denies any excessive shortness of breath.  Objective: Vitals:   06/05/22 1307 06/05/22 1933 06/06/22 0524 06/06/22 0944  BP: (!) 150/78 (!) 164/83 (!) 183/96 (!) 178/97  Pulse: 62 60 69 63  Resp: '18 18 18 16  '$ Temp: 97.9 F (36.6 C) 97.9 F (36.6 C) 97.9 F (36.6 C)   TempSrc: Axillary Oral Oral   SpO2: 97% 93%  98%  Weight:   108.2 kg   Height:        Intake/Output Summary (Last 24 hours) at 06/06/2022 1116 Last data filed at 06/06/2022 0943 Gross per 24 hour  Intake 1011.4 ml  Output 0 ml  Net 1011.4 ml   Filed Weights   06/04/22 1442 06/05/22 0536 06/06/22 0524  Weight: 109.4 kg 109.5 kg 108.2 kg    Examination:  General: Looks comfortable.  Talkative.  On 2 L oxygen. Cardiovascular: S1-S2 normal.  Regular rate rhythm. Respiratory: Bilateral clear.  No added sounds. Gastrointestinal: Soft and nontender.  Bowel sound present control patient blood loss. Ext: Chronic nonpitting edema. Neuro: Intact. Musculoskeletal: No deformities.      Data Reviewed: I have personally reviewed following labs and imaging studies  CBC: Recent Labs  Lab 06/03/22 1333 06/03/22 1633 06/04/22 0252 06/05/22 0612 06/06/22 0547  WBC 15.2*  --  9.9 6.4 5.6  NEUTROABS 13.2*  --   --   --   --   HGB 12.2 12.2 12.1 11.6* 11.0*  HCT 38.0 36.0 37.4 35.7* 34.5*  MCV 100.5*  --  100.8* 100.8* 100.9*  PLT 175  --  144* 135* 145*  Basic Metabolic Panel: Recent Labs  Lab 06/03/22 1333 06/03/22 1633 06/04/22 0252 06/05/22 0612 06/06/22 0547  NA 145 140 140 140 144  K 3.7 3.9 3.8 3.5 3.6  CL 105  --  104 104 109  CO2 27  --  '25 27 27  '$ GLUCOSE 98  --  139* 96 85  BUN 32*  --  41* 36* 19  CREATININE 1.99*  --  2.56* 1.79* 1.34*  CALCIUM 8.3*  --  8.4* 8.8* 9.1    GFR: Estimated Creatinine Clearance: 43.1 mL/min (A) (by C-G formula based on SCr of 1.34 mg/dL (H)). Liver Function Tests: No results for input(s): "AST", "ALT", "ALKPHOS", "BILITOT", "PROT", "ALBUMIN" in the last 168 hours. No results for input(s): "LIPASE", "AMYLASE" in the last 168 hours. No results for input(s): "AMMONIA" in the last 168 hours. Coagulation Profile: No results for input(s): "INR", "PROTIME" in the last 168 hours. Cardiac Enzymes: No results for input(s): "CKTOTAL", "CKMB", "CKMBINDEX", "TROPONINI" in the last 168 hours. BNP (last 3 results) No results for input(s): "PROBNP" in the last 8760 hours. HbA1C: No results for input(s): "HGBA1C" in the last 72 hours. CBG: No results for input(s): "GLUCAP" in the last 168 hours. Lipid Profile: Recent Labs    06/05/22 0612  CHOL 264*  HDL 60  LDLCALC 145*  TRIG 294*  CHOLHDL 4.4   Thyroid Function Tests: No results for input(s): "TSH", "T4TOTAL", "FREET4", "T3FREE", "THYROIDAB" in the last 72 hours. Anemia Panel: No results for input(s): "VITAMINB12", "FOLATE", "FERRITIN", "TIBC", "IRON", "RETICCTPCT" in the last 72 hours. Sepsis Labs: Recent Labs  Lab 06/03/22 1601 06/03/22 2101  LATICACIDVEN 2.7* 1.4    Recent Results (from the past 240 hour(s))  SARS Coronavirus 2 by RT PCR (hospital order, performed in [facility] hospital lab) *cepheid single result test* Anterior Nasal Swab     Status: None   Collection Time: 06/03/22  1:33 PM   Specimen: Anterior Nasal Swab  Result Value Ref Range Status   SARS Coronavirus 2 by RT PCR NEGATIVE NEGATIVE Final    Comment: (NOTE) SARS-CoV-2 target nucleic acids are NOT DETECTED.  The SARS-CoV-2 RNA is generally detectable in upper and lower respiratory specimens during the acute phase of infection. The lowest concentration of SARS-CoV-2 viral copies this assay can detect is 250 copies / mL. A negative result does not preclude SARS-CoV-2 infection and should not be  used as the sole basis for treatment or other patient management decisions.  A negative result may occur with improper specimen collection / handling, submission of specimen other than nasopharyngeal swab, presence of viral mutation(s) within the areas targeted by this assay, and inadequate number of viral copies (<250 copies / mL). A negative result must be combined with clinical observations, patient history, and epidemiological information.  Fact Sheet for Patients:   https://www.patel.info/  Fact Sheet for Healthcare Providers: https://hall.com/  This test is not yet approved or  cleared by the [place] FDA and has been authorized for detection and/or diagnosis of SARS-CoV-2 by FDA under an Emergency Use Authorization (EUA).  This EUA will remain in effect (meaning this test can be used) for the duration of the COVID-19 declaration under Section 564(b)(1) of the Act, 21 U.S.C. section 360bbb-3(b)(1), unless the authorization is terminated or revoked sooner.  Performed at [facility], [address] [place], [place] [address]          Radiology Studies: ECHOCARDIOGRAM COMPLETE  Result Date: 06/04/2022    ECHOCARDIOGRAM REPORT  Patient Name:   SHARLENA KRISTENSEN Date of Exam: 06/04/2022 Medical Rec #:  [id number]      Height:       66.0 in Accession #:    [id number]     Weight:       239.4 lb Date of Birth:  Mar 24, 1941      BSA:          2.159 m Patient Age:    46 years       BP:           93/75 mmHg Patient Gender: F              HR:           69 bpm. Exam Location:  Inpatient Procedure: 2D Echo, Cardiac Doppler and Color Doppler Indications:    Chest pain  History:        Patient has prior history of Echocardiogram examinations, most                 recent 05/20/2020. Previous Myocardial Infarction,                 Arrythmias:Atrial Fibrillation and Atrial Flutter; Risk                 Factors:Dyslipidemia, Hypertension and  Obesity. Thyroid disease.  Sonographer:    Eartha Inch Referring Phys: [id number] IZTIW Karianna Gusman  Sonographer Comments: Image acquisition challenging due to respiratory motion. IMPRESSIONS  1. Left ventricular ejection fraction, by estimation, is 60 to 65%. The left ventricle has normal function. The left ventricle has no regional wall motion abnormalities. Left ventricular diastolic parameters are consistent with Grade II diastolic dysfunction (pseudonormalization). Elevated left ventricular end-diastolic pressure.  2. Right ventricular systolic function is normal. The right ventricular size is normal. There is normal pulmonary artery systolic pressure.  3. Left atrial size was severely dilated.  4. The mitral valve is abnormal. Trivial mitral valve regurgitation. No evidence of mitral stenosis. Moderate mitral annular calcification.  5. Tricuspid valve regurgitation is moderate.  6. The aortic valve is normal in structure. There is mild calcification of the aortic valve. There is mild thickening of the aortic valve. Aortic valve regurgitation is trivial. Aortic valve sclerosis is present, with no evidence of aortic valve stenosis.  7. The inferior vena cava is normal in size with <50% respiratory variability, suggesting right atrial pressure of 8 mmHg. FINDINGS  Left Ventricle: Left ventricular ejection fraction, by estimation, is 60 to 65%. The left ventricle has normal function. The left ventricle has no regional wall motion abnormalities. The left ventricular internal cavity size was normal in size. There is  no left ventricular hypertrophy. Left ventricular diastolic parameters are consistent with Grade II diastolic dysfunction (pseudonormalization). Elevated left ventricular end-diastolic pressure. Right Ventricle: The right ventricular size is normal. No increase in right ventricular wall thickness. Right ventricular systolic function is normal. There is normal pulmonary artery systolic pressure. The  tricuspid regurgitant velocity is 2.71 m/s, and  with an assumed right atrial pressure of 3 mmHg, the estimated right ventricular systolic pressure is 58.0 mmHg. Left Atrium: Left atrial size was severely dilated. Right Atrium: Right atrial size was normal in size. Pericardium: There is no evidence of pericardial effusion. Mitral Valve: The mitral valve is abnormal. There is mild thickening of the mitral valve leaflet(s). There is mild calcification of the mitral valve leaflet(s). Moderate mitral annular calcification. Trivial mitral valve regurgitation. No evidence of mitral valve stenosis. Tricuspid Valve: The tricuspid  valve is normal in structure. Tricuspid valve regurgitation is moderate . No evidence of tricuspid stenosis. Aortic Valve: The aortic valve is normal in structure. There is mild calcification of the aortic valve. There is mild thickening of the aortic valve. Aortic valve regurgitation is trivial. Aortic valve sclerosis is present, with no evidence of aortic valve stenosis. Pulmonic Valve: The pulmonic valve was normal in structure. Pulmonic valve regurgitation is not visualized. No evidence of pulmonic stenosis. Aorta: The aortic root is normal in size and structure. Venous: The inferior vena cava is normal in size with less than 50% respiratory variability, suggesting right atrial pressure of 8 mmHg. IAS/Shunts: No atrial level shunt detected by color flow Doppler.  LEFT VENTRICLE PLAX 2D LVIDd:         5.30 cm   Diastology LVIDs:         3.30 cm   LV e' medial:    4.64 cm/s LV PW:         1.00 cm   LV E/e' medial:  22.8 LV IVS:        1.00 cm   LV e' lateral:   6.31 cm/s LVOT diam:     1.90 cm   LV E/e' lateral: 16.8 LVOT Area:     2.84 cm  RIGHT VENTRICLE             IVC RV S prime:     11.00 cm/s  IVC diam: 1.80 cm TAPSE (M-mode): 1.8 cm LEFT ATRIUM           Index        RIGHT ATRIUM           Index LA diam:      5.10 cm 2.36 cm/m   RA Area:     17.00 cm LA Vol (A2C): 80.0 ml 37.06 ml/m   RA Volume:   48.90 ml  22.65 ml/m   AORTA Ao Root diam: 3.10 cm Ao Asc diam:  3.70 cm MITRAL VALVE                TRICUSPID VALVE MV Area (PHT): 5.27 cm     TR Peak grad:   29.4 mmHg MV Decel Time: 144 msec     TR Vmax:        271.00 cm/s MV E velocity: 105.70 cm/s MV A velocity: 44.00 cm/s   SHUNTS MV E/A ratio:  2.40         Systemic Diam: 1.90 cm Jenkins Rouge MD Electronically signed by Jenkins Rouge MD Signature Date/Time: 06/04/2022/2:27:09 PM    Final         Scheduled Meds:  aspirin EC  81 mg Oral Daily   cholecalciferol  1,000 Units Oral QPM   cyanocobalamin  500 mcg Oral Q M,W,F   docusate sodium  100 mg Oral BID   DULoxetine  30 mg Oral QHS   fentaNYL  1 patch Transdermal Q72H   levothyroxine  200 mcg Oral QAC breakfast   metoprolol succinate  25 mg Oral Daily   pantoprazole  20 mg Oral Daily   predniSONE  15 mg Oral Daily   pregabalin  75 mg Oral BID   Continuous Infusions:  heparin 1,450 Units/hr (06/06/22 0517)     LOS: 2 days    Time spent: 35 minutes    Barb Merino, MD [facility] Pager [phone]

## 2022-06-06 NOTE — H&P (View-Only) (Signed)
Progress Note  Patient Name: Nicole Norton Date of Encounter: 06/06/2022  [facility] Cardiologist: Quay Burow, MD   Subjective   NAEO. Renal function improving.   Inpatient Medications    Scheduled Meds:  amLODipine  5 mg Oral Daily   aspirin EC  81 mg Oral Daily   cholecalciferol  1,000 Units Oral QPM   cyanocobalamin  500 mcg Oral Q M,W,F   docusate sodium  100 mg Oral BID   DULoxetine  30 mg Oral QHS   fentaNYL  1 patch Transdermal Q72H   levothyroxine  200 mcg Oral QAC breakfast   metoprolol succinate  25 mg Oral Daily   pantoprazole  20 mg Oral Daily   predniSONE  15 mg Oral Daily   pregabalin  75 mg Oral BID   Continuous Infusions:  heparin 1,450 Units/hr (06/06/22 0517)   PRN Meds: acetaminophen **OR** acetaminophen, diclofenac Sodium, diphenhydrAMINE, hydrALAZINE, nitroGLYCERIN, oxyCODONE   Vital Signs    Vitals:   06/05/22 0536 06/05/22 1307 06/05/22 1933 06/06/22 0524  BP: (!) 154/78 (!) 150/78 (!) 164/83 (!) 183/96  Pulse: 60 62 60 69  Resp: '20 18 18 18  '$ Temp: 97.6 F (36.4 C) 97.9 F (36.6 C) 97.9 F (36.6 C) 97.9 F (36.6 C)  TempSrc: Oral Axillary Oral Oral  SpO2: 98% 97% 93%   Weight: 109.5 kg   108.2 kg  Height:        Intake/Output Summary (Last 24 hours) at 06/06/2022 0858 Last data filed at 06/05/2022 2136 Gross per 24 hour  Intake 1071.4 ml  Output --  Net 1071.4 ml       06/06/2022    5:24 AM 06/05/2022    5:36 AM 06/04/2022    2:42 PM  Last 3 Weights  Weight (lbs) 238 lb 9.6 oz 241 lb 4.8 oz 241 lb 2.9 oz  Weight (kg) 108.228 kg 109.453 kg 109.4 kg      Telemetry    Personally Reviewed  ECG    Personally Reviewed  Physical Exam   GEN: No acute distress.  Obese. Neck: No JVD Cardiac: RRR, no murmurs, rubs, or gallops.  Respiratory: Clear to auscultation bilaterally. GI: Soft, nontender, non-distended  MS: No edema; No deformity. Neuro:  Nonfocal  Psych: Normal affect   Labs    High Sensitivity  Troponin:   Recent Labs  Lab 06/03/22 1333 06/03/22 1601  TROPONINIHS 1,546* 1,942*      Chemistry Recent Labs  Lab 06/04/22 0252 06/05/22 0612 06/06/22 0547  NA 140 140 144  K 3.8 3.5 3.6  CL 104 104 109  CO2 '25 27 27  '$ GLUCOSE 139* 96 85  BUN 41* 36* 19  CREATININE 2.56* 1.79* 1.34*  CALCIUM 8.4* 8.8* 9.1  GFRNONAA 19* 29* 41*  ANIONGAP '11 9 8     '$ Lipids  Recent Labs  Lab 06/05/22 0612  CHOL 264*  TRIG 294*  HDL 60  LDLCALC 145*  CHOLHDL 4.4     Hematology Recent Labs  Lab 06/04/22 0252 06/05/22 0612 06/06/22 0547  WBC 9.9 6.4 5.6  RBC 3.71* 3.54* 3.42*  HGB 12.1 11.6* 11.0*  HCT 37.4 35.7* 34.5*  MCV 100.8* 100.8* 100.9*  MCH 32.6 32.8 32.2  MCHC 32.4 32.5 31.9  RDW 14.9 14.6 14.6  PLT 144* 135* 145*    Thyroid No results for input(s): "TSH", "FREET4" in the last 168 hours.  BNP Recent Labs  Lab 06/03/22 1333  BNP 843.0*     DDimer No  results for input(s): "DDIMER" in the last 168 hours.   Radiology    ECHOCARDIOGRAM COMPLETE  Result Date: 06/04/2022    ECHOCARDIOGRAM REPORT   Patient Name:   [name] Date of Exam: 06/04/2022 Medical Rec #:  [id number]      Height:       66.0 in Accession #:    [id number]     Weight:       239.4 lb Date of Birth:  02/03/1941      BSA:          2.159 m Patient Age:    81 years       BP:           93/75 mmHg Patient Gender: F              HR:           69 bpm. Exam Location:  Inpatient Procedure: 2D Echo, Cardiac Doppler and Color Doppler Indications:    Chest pain  History:        Patient has prior history of Echocardiogram examinations, most                 recent 05/20/2020. Previous Myocardial Infarction,                 Arrythmias:Atrial Fibrillation and Atrial Flutter; Risk                 Factors:Dyslipidemia, Hypertension and Obesity. Thyroid disease.  Sonographer:    Eartha Inch Referring Phys: [id number] NOBSJ GHIMIRE  Sonographer Comments: Image acquisition challenging due to respiratory motion.  IMPRESSIONS  1. Left ventricular ejection fraction, by estimation, is 60 to 65%. The left ventricle has normal function. The left ventricle has no regional wall motion abnormalities. Left ventricular diastolic parameters are consistent with Grade II diastolic dysfunction (pseudonormalization). Elevated left ventricular end-diastolic pressure.  2. Right ventricular systolic function is normal. The right ventricular size is normal. There is normal pulmonary artery systolic pressure.  3. Left atrial size was severely dilated.  4. The mitral valve is abnormal. Trivial mitral valve regurgitation. No evidence of mitral stenosis. Moderate mitral annular calcification.  5. Tricuspid valve regurgitation is moderate.  6. The aortic valve is normal in structure. There is mild calcification of the aortic valve. There is mild thickening of the aortic valve. Aortic valve regurgitation is trivial. Aortic valve sclerosis is present, with no evidence of aortic valve stenosis.  7. The inferior vena cava is normal in size with <50% respiratory variability, suggesting right atrial pressure of 8 mmHg. FINDINGS  Left Ventricle: Left ventricular ejection fraction, by estimation, is 60 to 65%. The left ventricle has normal function. The left ventricle has no regional wall motion abnormalities. The left ventricular internal cavity size was normal in size. There is  no left ventricular hypertrophy. Left ventricular diastolic parameters are consistent with Grade II diastolic dysfunction (pseudonormalization). Elevated left ventricular end-diastolic pressure. Right Ventricle: The right ventricular size is normal. No increase in right ventricular wall thickness. Right ventricular systolic function is normal. There is normal pulmonary artery systolic pressure. The tricuspid regurgitant velocity is 2.71 m/s, and  with an assumed right atrial pressure of 3 mmHg, the estimated right ventricular systolic pressure is 62.8 mmHg. Left Atrium: Left  atrial size was severely dilated. Right Atrium: Right atrial size was normal in size. Pericardium: There is no evidence of pericardial effusion. Mitral Valve: The mitral valve is abnormal. There is mild thickening of the mitral valve  leaflet(s). There is mild calcification of the mitral valve leaflet(s). Moderate mitral annular calcification. Trivial mitral valve regurgitation. No evidence of mitral valve stenosis. Tricuspid Valve: The tricuspid valve is normal in structure. Tricuspid valve regurgitation is moderate . No evidence of tricuspid stenosis. Aortic Valve: The aortic valve is normal in structure. There is mild calcification of the aortic valve. There is mild thickening of the aortic valve. Aortic valve regurgitation is trivial. Aortic valve sclerosis is present, with no evidence of aortic valve stenosis. Pulmonic Valve: The pulmonic valve was normal in structure. Pulmonic valve regurgitation is not visualized. No evidence of pulmonic stenosis. Aorta: The aortic root is normal in size and structure. Venous: The inferior vena cava is normal in size with less than 50% respiratory variability, suggesting right atrial pressure of 8 mmHg. IAS/Shunts: No atrial level shunt detected by color flow Doppler.  LEFT VENTRICLE PLAX 2D LVIDd:         5.30 cm   Diastology LVIDs:         3.30 cm   LV e' medial:    4.64 cm/s LV PW:         1.00 cm   LV E/e' medial:  22.8 LV IVS:        1.00 cm   LV e' lateral:   6.31 cm/s LVOT diam:     1.90 cm   LV E/e' lateral: 16.8 LVOT Area:     2.84 cm  RIGHT VENTRICLE             IVC RV S prime:     11.00 cm/s  IVC diam: 1.80 cm TAPSE (M-mode): 1.8 cm LEFT ATRIUM           Index        RIGHT ATRIUM           Index LA diam:      5.10 cm 2.36 cm/m   RA Area:     17.00 cm LA Vol (A2C): 80.0 ml 37.06 ml/m  RA Volume:   48.90 ml  22.65 ml/m   AORTA Ao Root diam: 3.10 cm Ao Asc diam:  3.70 cm MITRAL VALVE                TRICUSPID VALVE MV Area (PHT): 5.27 cm     TR Peak grad:   29.4  mmHg MV Decel Time: 144 msec     TR Vmax:        271.00 cm/s MV E velocity: 105.70 cm/s MV A velocity: 44.00 cm/s   SHUNTS MV E/A ratio:  2.40         Systemic Diam: 1.90 cm Jenkins Rouge MD Electronically signed by Jenkins Rouge MD Signature Date/Time: 06/04/2022/2:27:09 PM    Final       Assessment & Plan     #NSTEMI Cont heparin Tentatively planning for [facility] Monday. Cont asa  #HFpEF Echo with normal LV function  #pAF Cont IV heparin On xarelto as outpatient.  #HTN Add prn hydral for SBP > 170mHg.   For questions or updates, please contact CCordovaPlease consult [email or web address] for contact info under        Signed, CVickie Epley MD  06/06/2022, 8:58 AM

## 2022-06-06 NOTE — Plan of Care (Signed)
°  Problem: Clinical Measurements: °Goal: Ability to maintain clinical measurements within normal limits will improve °Outcome: Progressing °Goal: Diagnostic test results will improve °Outcome: Progressing °Goal: Cardiovascular complication will be avoided °Outcome: Progressing °  °
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
## 2022-06-06 NOTE — Progress Notes (Signed)
Progress Note  Patient Name: Nicole Norton Date of Encounter: 06/06/2022  [facility] Cardiologist: Quay Burow, MD   Subjective   NAEO. Renal function improving.   Inpatient Medications    Scheduled Meds:  amLODipine  5 mg Oral Daily   aspirin EC  81 mg Oral Daily   cholecalciferol  1,000 Units Oral QPM   cyanocobalamin  500 mcg Oral Q M,W,F   docusate sodium  100 mg Oral BID   DULoxetine  30 mg Oral QHS   fentaNYL  1 patch Transdermal Q72H   levothyroxine  200 mcg Oral QAC breakfast   metoprolol succinate  25 mg Oral Daily   pantoprazole  20 mg Oral Daily   predniSONE  15 mg Oral Daily   pregabalin  75 mg Oral BID   Continuous Infusions:  heparin 1,450 Units/hr (06/06/22 0517)   PRN Meds: acetaminophen **OR** acetaminophen, diclofenac Sodium, diphenhydrAMINE, hydrALAZINE, nitroGLYCERIN, oxyCODONE   Vital Signs    Vitals:   06/05/22 0536 06/05/22 1307 06/05/22 1933 06/06/22 0524  BP: (!) 154/78 (!) 150/78 (!) 164/83 (!) 183/96  Pulse: 60 62 60 69  Resp: '20 18 18 18  '$ Temp: 97.6 F (36.4 C) 97.9 F (36.6 C) 97.9 F (36.6 C) 97.9 F (36.6 C)  TempSrc: Oral Axillary Oral Oral  SpO2: 98% 97% 93%   Weight: 109.5 kg   108.2 kg  Height:        Intake/Output Summary (Last 24 hours) at 06/06/2022 0858 Last data filed at 06/05/2022 2136 Gross per 24 hour  Intake 1071.4 ml  Output --  Net 1071.4 ml       06/06/2022    5:24 AM 06/05/2022    5:36 AM 06/04/2022    2:42 PM  Last 3 Weights  Weight (lbs) 238 lb 9.6 oz 241 lb 4.8 oz 241 lb 2.9 oz  Weight (kg) 108.228 kg 109.453 kg 109.4 kg      Telemetry    Personally Reviewed  ECG    Personally Reviewed  Physical Exam   GEN: No acute distress.  Obese. Neck: No JVD Cardiac: RRR, no murmurs, rubs, or gallops.  Respiratory: Clear to auscultation bilaterally. GI: Soft, nontender, non-distended  MS: No edema; No deformity. Neuro:  Nonfocal  Psych: Normal affect   Labs    High Sensitivity  Troponin:   Recent Labs  Lab 06/03/22 1333 06/03/22 1601  TROPONINIHS 1,546* 1,942*      Chemistry Recent Labs  Lab 06/04/22 0252 06/05/22 0612 06/06/22 0547  NA 140 140 144  K 3.8 3.5 3.6  CL 104 104 109  CO2 '25 27 27  '$ GLUCOSE 139* 96 85  BUN 41* 36* 19  CREATININE 2.56* 1.79* 1.34*  CALCIUM 8.4* 8.8* 9.1  GFRNONAA 19* 29* 41*  ANIONGAP '11 9 8     '$ Lipids  Recent Labs  Lab 06/05/22 0612  CHOL 264*  TRIG 294*  HDL 60  LDLCALC 145*  CHOLHDL 4.4     Hematology Recent Labs  Lab 06/04/22 0252 06/05/22 0612 06/06/22 0547  WBC 9.9 6.4 5.6  RBC 3.71* 3.54* 3.42*  HGB 12.1 11.6* 11.0*  HCT 37.4 35.7* 34.5*  MCV 100.8* 100.8* 100.9*  MCH 32.6 32.8 32.2  MCHC 32.4 32.5 31.9  RDW 14.9 14.6 14.6  PLT 144* 135* 145*    Thyroid No results for input(s): "TSH", "FREET4" in the last 168 hours.  BNP Recent Labs  Lab 06/03/22 1333  BNP 843.0*     DDimer No  results for input(s): "DDIMER" in the last 168 hours.   Radiology    ECHOCARDIOGRAM COMPLETE  Result Date: 06/04/2022    ECHOCARDIOGRAM REPORT   Patient Name:   Nicole Norton Date of Exam: 06/04/2022 Medical Rec #:  [id number]      Height:       66.0 in Accession #:    [id number]     Weight:       239.4 lb Date of Birth:  06-Sep-1941      BSA:          2.159 m Patient Age:    81 years       BP:           93/75 mmHg Patient Gender: F              HR:           69 bpm. Exam Location:  Inpatient Procedure: 2D Echo, Cardiac Doppler and Color Doppler Indications:    Chest pain  History:        Patient has prior history of Echocardiogram examinations, most                 recent 05/20/2020. Previous Myocardial Infarction,                 Arrythmias:Atrial Fibrillation and Atrial Flutter; Risk                 Factors:Dyslipidemia, Hypertension and Obesity. Thyroid disease.  Sonographer:    Eartha Inch Referring Phys: [id number] YJEHU GHIMIRE  Sonographer Comments: Image acquisition challenging due to respiratory motion.  IMPRESSIONS  1. Left ventricular ejection fraction, by estimation, is 60 to 65%. The left ventricle has normal function. The left ventricle has no regional wall motion abnormalities. Left ventricular diastolic parameters are consistent with Grade II diastolic dysfunction (pseudonormalization). Elevated left ventricular end-diastolic pressure.  2. Right ventricular systolic function is normal. The right ventricular size is normal. There is normal pulmonary artery systolic pressure.  3. Left atrial size was severely dilated.  4. The mitral valve is abnormal. Trivial mitral valve regurgitation. No evidence of mitral stenosis. Moderate mitral annular calcification.  5. Tricuspid valve regurgitation is moderate.  6. The aortic valve is normal in structure. There is mild calcification of the aortic valve. There is mild thickening of the aortic valve. Aortic valve regurgitation is trivial. Aortic valve sclerosis is present, with no evidence of aortic valve stenosis.  7. The inferior vena cava is normal in size with <50% respiratory variability, suggesting right atrial pressure of 8 mmHg. FINDINGS  Left Ventricle: Left ventricular ejection fraction, by estimation, is 60 to 65%. The left ventricle has normal function. The left ventricle has no regional wall motion abnormalities. The left ventricular internal cavity size was normal in size. There is  no left ventricular hypertrophy. Left ventricular diastolic parameters are consistent with Grade II diastolic dysfunction (pseudonormalization). Elevated left ventricular end-diastolic pressure. Right Ventricle: The right ventricular size is normal. No increase in right ventricular wall thickness. Right ventricular systolic function is normal. There is normal pulmonary artery systolic pressure. The tricuspid regurgitant velocity is 2.71 m/s, and  with an assumed right atrial pressure of 3 mmHg, the estimated right ventricular systolic pressure is 31.4 mmHg. Left Atrium: Left  atrial size was severely dilated. Right Atrium: Right atrial size was normal in size. Pericardium: There is no evidence of pericardial effusion. Mitral Valve: The mitral valve is abnormal. There is mild thickening of the mitral valve  leaflet(s). There is mild calcification of the mitral valve leaflet(s). Moderate mitral annular calcification. Trivial mitral valve regurgitation. No evidence of mitral valve stenosis. Tricuspid Valve: The tricuspid valve is normal in structure. Tricuspid valve regurgitation is moderate . No evidence of tricuspid stenosis. Aortic Valve: The aortic valve is normal in structure. There is mild calcification of the aortic valve. There is mild thickening of the aortic valve. Aortic valve regurgitation is trivial. Aortic valve sclerosis is present, with no evidence of aortic valve stenosis. Pulmonic Valve: The pulmonic valve was normal in structure. Pulmonic valve regurgitation is not visualized. No evidence of pulmonic stenosis. Aorta: The aortic root is normal in size and structure. Venous: The inferior vena cava is normal in size with less than 50% respiratory variability, suggesting right atrial pressure of 8 mmHg. IAS/Shunts: No atrial level shunt detected by color flow Doppler.  LEFT VENTRICLE PLAX 2D LVIDd:         5.30 cm   Diastology LVIDs:         3.30 cm   LV e' medial:    4.64 cm/s LV PW:         1.00 cm   LV E/e' medial:  22.8 LV IVS:        1.00 cm   LV e' lateral:   6.31 cm/s LVOT diam:     1.90 cm   LV E/e' lateral: 16.8 LVOT Area:     2.84 cm  RIGHT VENTRICLE             IVC RV S prime:     11.00 cm/s  IVC diam: 1.80 cm TAPSE (M-mode): 1.8 cm LEFT ATRIUM           Index        RIGHT ATRIUM           Index LA diam:      5.10 cm 2.36 cm/m   RA Area:     17.00 cm LA Vol (A2C): 80.0 ml 37.06 ml/m  RA Volume:   48.90 ml  22.65 ml/m   AORTA Ao Root diam: 3.10 cm Ao Asc diam:  3.70 cm MITRAL VALVE                TRICUSPID VALVE MV Area (PHT): 5.27 cm     TR Peak grad:   29.4  mmHg MV Decel Time: 144 msec     TR Vmax:        271.00 cm/s MV E velocity: 105.70 cm/s MV A velocity: 44.00 cm/s   SHUNTS MV E/A ratio:  2.40         Systemic Diam: 1.90 cm Jenkins Rouge MD Electronically signed by Jenkins Rouge MD Signature Date/Time: 06/04/2022/2:27:09 PM    Final       Assessment & Plan     #NSTEMI Cont heparin Tentatively planning for [facility] Monday. Cont asa  #HFpEF Echo with normal LV function  #pAF Cont IV heparin On xarelto as outpatient.  #HTN Add prn hydral for SBP > 117mHg.   For questions or updates, please contact [place] CityPlease consult [email or web address] for contact info under        Signed, CVickie Epley MD  06/06/2022, 8:58 AM

## 2022-06-07 ENCOUNTER — Encounter (HOSPITAL_COMMUNITY): Payer: Self-pay | Admitting: Cardiovascular Disease

## 2022-06-07 ENCOUNTER — Encounter (HOSPITAL_COMMUNITY)
Admission: RE | Disposition: A | Discharge status: Home / Self Care | Payer: Self-pay | Source: Home / Self Care | Attending: Internal Medicine

## 2022-06-07 DIAGNOSIS — N1832 Chronic kidney disease, stage 3b: Secondary | ICD-10-CM | POA: Diagnosis not present

## 2022-06-07 DIAGNOSIS — I214 Non-ST elevation (NSTEMI) myocardial infarction: Secondary | ICD-10-CM | POA: Diagnosis not present

## 2022-06-07 DIAGNOSIS — I484 Atypical atrial flutter: Secondary | ICD-10-CM | POA: Diagnosis not present

## 2022-06-07 HISTORY — PX: RIGHT/LEFT HEART CATH AND CORONARY ANGIOGRAPHY: CATH118266

## 2022-06-07 LAB — POCT I-STAT 7, (LYTES, BLD GAS, ICA,H+H)
Acid-Base Excess: 2 mmol/L (ref 0.0–2.0)
Bicarbonate: 28.6 mmol/L — ABNORMAL HIGH (ref 20.0–28.0)
Calcium, Ion: 1.3 mmol/L (ref 1.15–1.40)
HCT: 32 % — ABNORMAL LOW (ref 36.0–46.0)
Hemoglobin: 10.9 g/dL — ABNORMAL LOW (ref 12.0–15.0)
O2 Saturation: 93 %
Potassium: 3.5 mmol/L (ref 3.5–5.1)
Sodium: 143 mmol/L (ref 135–145)
TCO2: 30 mmol/L (ref 22–32)
pCO2 arterial: 52 mmHg — ABNORMAL HIGH (ref 32–48)
pH, Arterial: 7.348 — ABNORMAL LOW (ref 7.35–7.45)
pO2, Arterial: 73 mmHg — ABNORMAL LOW (ref 83–108)

## 2022-06-07 LAB — POCT I-STAT EG7
Acid-Base Excess: 3 mmol/L — ABNORMAL HIGH (ref 0.0–2.0)
Bicarbonate: 29.4 mmol/L — ABNORMAL HIGH (ref 20.0–28.0)
Calcium, Ion: 1.28 mmol/L (ref 1.15–1.40)
HCT: 32 % — ABNORMAL LOW (ref 36.0–46.0)
Hemoglobin: 10.9 g/dL — ABNORMAL LOW (ref 12.0–15.0)
O2 Saturation: 59 %
Potassium: 3.4 mmol/L — ABNORMAL LOW (ref 3.5–5.1)
Sodium: 145 mmol/L (ref 135–145)
TCO2: 31 mmol/L (ref 22–32)
pCO2, Ven: 53.8 mmHg (ref 44–60)
pH, Ven: 7.346 (ref 7.25–7.43)
pO2, Ven: 33 mmHg (ref 32–45)

## 2022-06-07 LAB — BASIC METABOLIC PANEL
Anion gap: 13 (ref 5–15)
BUN: 15 mg/dL (ref 8–23)
CO2: 29 mmol/L (ref 22–32)
Calcium: 9.5 mg/dL (ref 8.9–10.3)
Chloride: 103 mmol/L (ref 98–111)
Creatinine, Ser: 1.3 mg/dL — ABNORMAL HIGH (ref 0.44–1.00)
GFR, Estimated: 42 mL/min — ABNORMAL LOW (ref >60)
Glucose, Bld: 83 mg/dL (ref 70–99)
Potassium: 3.6 mmol/L (ref 3.5–5.1)
Sodium: 145 mmol/L (ref 135–145)

## 2022-06-07 LAB — HEPARIN LEVEL (UNFRACTIONATED): Heparin Unfractionated: 0.67 IU/mL (ref 0.30–0.70)

## 2022-06-07 LAB — CBC
HCT: 34.2 % — ABNORMAL LOW (ref 36.0–46.0)
Hemoglobin: 11.2 g/dL — ABNORMAL LOW (ref 12.0–15.0)
MCH: 32.7 pg (ref 26.0–34.0)
MCHC: 32.7 g/dL (ref 30.0–36.0)
MCV: 99.7 fL (ref 80.0–100.0)
Platelets: 143 10*3/uL — ABNORMAL LOW (ref 150–400)
RBC: 3.43 MIL/uL — ABNORMAL LOW (ref 3.87–5.11)
RDW: 14.7 % (ref 11.5–15.5)
WBC: 5.6 10*3/uL (ref 4.0–10.5)
nRBC: 0 % (ref 0.0–0.2)

## 2022-06-07 SURGERY — RIGHT/LEFT HEART CATH AND CORONARY ANGIOGRAPHY
Anesthesia: LOCAL

## 2022-06-07 MED ORDER — HEPARIN (PORCINE) IN NACL 1000-0.9 UT/500ML-% IV SOLN
INTRAVENOUS | Status: DC | PRN
  Administered 2022-06-07 (×3): 500 mL

## 2022-06-07 MED ORDER — AMLODIPINE BESYLATE 2.5 MG PO TABS
2.5000 mg | ORAL_TABLET | Freq: Every day | ORAL | Status: DC
  Administered 2022-06-07: 2.5 mg via ORAL
  Filled 2022-06-07: qty 1

## 2022-06-07 MED ORDER — SODIUM CHLORIDE 0.9% FLUSH
3.0000 mL | Freq: Two times a day (BID) | INTRAVENOUS | Status: DC
  Administered 2022-06-07: 3 mL via INTRAVENOUS

## 2022-06-07 MED ORDER — LABETALOL HCL 5 MG/ML IV SOLN: 10.0000 mg | INTRAVENOUS | Status: AC | PRN

## 2022-06-07 MED ORDER — EZETIMIBE 10 MG PO TABS
10.0000 mg | ORAL_TABLET | Freq: Every day | ORAL | Status: DC
  Administered 2022-06-07: 10 mg via ORAL
  Filled 2022-06-07: qty 1

## 2022-06-07 MED ORDER — HEPARIN (PORCINE) IN NACL 1000-0.9 UT/500ML-% IV SOLN
INTRAVENOUS | Status: AC
  Filled 2022-06-07: qty 1000

## 2022-06-07 MED ORDER — LIDOCAINE HCL (PF) 1 % IJ SOLN
INTRAMUSCULAR | Status: AC
  Filled 2022-06-07: qty 30

## 2022-06-07 MED ORDER — HEPARIN SODIUM (PORCINE) 1000 UNIT/ML IJ SOLN
INTRAMUSCULAR | Status: DC | PRN
  Administered 2022-06-07: 5000 [IU] via INTRAVENOUS

## 2022-06-07 MED ORDER — IOHEXOL 350 MG/ML SOLN
INTRAVENOUS | Status: DC | PRN
  Administered 2022-06-07: 30 mL via INTRA_ARTERIAL

## 2022-06-07 MED ORDER — ONDANSETRON HCL 4 MG/2ML IJ SOLN: 4.0000 mg | Freq: Four times a day (QID) | INTRAMUSCULAR | Status: DC | PRN

## 2022-06-07 MED ORDER — FENTANYL CITRATE (PF) 100 MCG/2ML IJ SOLN
INTRAMUSCULAR | Status: AC
  Filled 2022-06-07: qty 2

## 2022-06-07 MED ORDER — SODIUM CHLORIDE 0.9 % IV SOLN: 250.0000 mL | INTRAVENOUS | Status: DC | PRN

## 2022-06-07 MED ORDER — SODIUM CHLORIDE 0.9 % IV SOLN
250.0000 mL | INTRAVENOUS | Status: DC | PRN
Start: 2022-06-07 — End: 2022-06-07

## 2022-06-07 MED ORDER — VERAPAMIL HCL 2.5 MG/ML IV SOLN
INTRAVENOUS | Status: DC | PRN
  Administered 2022-06-07: 10 mL via INTRA_ARTERIAL

## 2022-06-07 MED ORDER — SODIUM CHLORIDE 0.9% FLUSH: 3.0000 mL | INTRAVENOUS | Status: DC | PRN

## 2022-06-07 MED ORDER — FENTANYL CITRATE (PF) 100 MCG/2ML IJ SOLN
INTRAMUSCULAR | Status: DC | PRN
  Administered 2022-06-07: 25 ug via INTRAVENOUS

## 2022-06-07 MED ORDER — ASPIRIN 81 MG PO CHEW
81.0000 mg | CHEWABLE_TABLET | ORAL | Status: AC
  Administered 2022-06-07: 81 mg via ORAL
  Filled 2022-06-07: qty 1

## 2022-06-07 MED ORDER — RIVAROXABAN 15 MG PO TABS: 15.0000 mg | ORAL_TABLET | Freq: Every day | ORAL | Status: DC

## 2022-06-07 MED ORDER — LIDOCAINE HCL (PF) 1 % IJ SOLN
INTRAMUSCULAR | Status: DC | PRN
  Administered 2022-06-07 (×2): 2 mL

## 2022-06-07 MED ORDER — SODIUM CHLORIDE 0.9% FLUSH
3.0000 mL | Freq: Two times a day (BID) | INTRAVENOUS | Status: DC
Start: 2022-06-07 — End: 2022-06-07
  Administered 2022-06-07: 3 mL via INTRAVENOUS

## 2022-06-07 MED ORDER — SODIUM CHLORIDE 0.9 % IV SOLN: INTRAVENOUS | Status: AC

## 2022-06-07 MED ORDER — SODIUM CHLORIDE 0.9% FLUSH
3.0000 mL | INTRAVENOUS | Status: DC | PRN
Start: 2022-06-07 — End: 2022-06-07

## 2022-06-07 MED ORDER — SODIUM CHLORIDE 0.9 % WEIGHT BASED INFUSION
3.0000 mL/kg/h | INTRAVENOUS | Status: DC
Start: 2022-06-07 — End: 2022-06-07

## 2022-06-07 MED ORDER — HEPARIN SODIUM (PORCINE) 1000 UNIT/ML IJ SOLN
INTRAMUSCULAR | Status: AC
  Filled 2022-06-07: qty 10

## 2022-06-07 MED ORDER — AMLODIPINE BESYLATE 2.5 MG PO TABS: 2.5000 mg | ORAL_TABLET | Freq: Every day | ORAL | 0 refills | Status: AC

## 2022-06-07 MED ORDER — MIDAZOLAM HCL 2 MG/2ML IJ SOLN
INTRAMUSCULAR | Status: DC | PRN
  Administered 2022-06-07: 1 mg via INTRAVENOUS

## 2022-06-07 MED ORDER — VERAPAMIL HCL 2.5 MG/ML IV SOLN
INTRAVENOUS | Status: AC
  Filled 2022-06-07: qty 2

## 2022-06-07 MED ORDER — SODIUM CHLORIDE 0.9 % WEIGHT BASED INFUSION
1.0000 mL/kg/h | INTRAVENOUS | Status: DC
  Administered 2022-06-07: 1 mL/kg/h via INTRAVENOUS

## 2022-06-07 MED ORDER — MIDAZOLAM HCL 2 MG/2ML IJ SOLN
INTRAMUSCULAR | Status: AC
  Filled 2022-06-07: qty 2

## 2022-06-07 MED ORDER — EZETIMIBE 10 MG PO TABS: 10.0000 mg | ORAL_TABLET | Freq: Every day | ORAL | 0 refills | Status: DC

## 2022-06-07 SURGICAL SUPPLY — 10 items
CATH 5FR JL3.5 JR4 ANG PIG MP (CATHETERS) ×1 IMPLANT
CATH BALLN WEDGE 5F 110CM (CATHETERS) ×1 IMPLANT
GLIDESHEATH SLEND SS 6F .021 (SHEATH) ×1 IMPLANT
GUIDEWIRE INQWIRE 1.5J.035X260 (WIRE) IMPLANT
INQWIRE 1.5J .035X260CM (WIRE) ×2
KIT HEART LEFT (KITS) ×2 IMPLANT
PACK CARDIAC CATHETERIZATION (CUSTOM PROCEDURE TRAY) ×2 IMPLANT
SHEATH GLIDE SLENDER 4/5FR (SHEATH) ×1 IMPLANT
TRANSDUCER W/STOPCOCK (MISCELLANEOUS) ×2 IMPLANT
TUBING CIL FLEX 10 FLL-RA (TUBING) ×2 IMPLANT

## 2022-06-07 NOTE — Progress Notes (Signed)
Mobility Specialist Progress Note:   06/07/22 1100  Mobility  Activity Ambulated with assistance in room;Ambulated with assistance to bathroom  Level of Assistance Minimal assist, patient does 75% or more  Assistive Device  (Iv pole)  Distance Ambulated (ft) 28 ft  Activity Response Tolerated well  $Mobility charge 1 Mobility   Pt received in bed asking to go to the bathroom. No complaints of pain. MinA to stand from toilet. Left EOB with call bell in reach and all needs met.   [facility] [employer] Mobility Specialist

## 2022-06-07 NOTE — Discharge Summary (Signed)
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  In the emergency room, renal functions at about baseline.  Initial troponin 1546-1900.  EKG with nonspecific ST depression.  Due to elevated troponins, patient was started on heparin infusion and admitted to the hospital. Patient was given additional diuresis, caused worsening renal functions.  Remained in the hospital, underwent cardiac catheterization and found to have normal coronaries.  Currently stabilized.   Assessment & Plan:   Neck pain/back pain, suspected non-STEMI: Ruled out acute coronary syndrome.  Probably musculoskeletal pain. Pain improved with resuming her fentanyl and oxycodone. Stable EKG and troponins. Aspirin 81 mg daily. Nitroglycerin, as needed. Cardiac cath with normal coronaries.   Acute kidney injury on CKD stage IIIa: Baseline creatinine 1.6-1.99-2.56-1.79-1.3-1.3. Renal functions trending down and is stabilizing.  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  On maintenance prednisone.  Continue.  Reported prednisone 20 mg daily.  Continue.  Fibromyalgia and chronic pain syndrome, anxiety and depression: On oxycodone, Lyrica and Cymbalta. Resume fentanyl and as needed  oxycodone.  Pain improved now.   Paroxysmal A-fib: On sinus rhythm.  Rate controlled.  Metoprolol.  Was on periprocedure heparin.  She will start Xarelto tonight.  Hypothyroidism: On Synthroid.  Medically stabilized.  Negative cardiac cath.  Able to go home today.   Discharge Diagnoses:  Principal Problem:   NSTEMI (non-ST elevated myocardial infarction) ([place]) Active Problems:   Nocturnal hypoxemia   Hypothyroidism   Hyperlipidemia LDL goal <160   Essential hypertension, benign   Obesity, Class II, BMI 35-39.9   Depression with anxiety   Gastroesophageal reflux disease without esophagitis   Chronic anticoagulation   CKD (chronic kidney disease) stage 3, GFR 30-59 ml/min (HCC)   Atypical atrial flutter (HCC)   CHF (congestive heart failure) [facility])    Discharge Instructions  Discharge Instructions     Diet - low sodium heart healthy   Complete by: As directed    Increase activity slowly   Complete by: As directed       Allergies as of 06/07/2022       Reactions   Allopurinol Palpitations   Uloric [febuxostat] Other (See Comments)   Episodes of AFIB   Ace Inhibitors Other (See Comments)   Renal function   Colchicine Other (See Comments)   palpitations   Dilaudid [hydromorphone] Other (See Comments)   Decreased respiratory drive   Etodolac Other (See Comments)   heart flutters and headaches    Omeprazole Nausea Only   Paroxetine Nausea Only   Dizzy   Repatha [evolocumab]    Palpitations/AFib/pain   Solu-medrol [methylprednisolone Sodium Succ]    Heart palpitations/bad headache   Statins Tinitus   myalgias   Sulfa Antibiotics Other (See Comments)   Gout flare   Lasix [furosemide] Rash   Does not work very well   Penicillins Rash   Tolerated Ceftriaxone 4/10-4/11/20 \ Did it involve swelling of the  face/tongue/throat, SOB, or low BP? No Did it involve sudden or severe rash/hives, skin peeling, or any reaction on the inside of your mouth or nose? No Did you  need to seek medical attention at a hospital or doctor's office? No When did it last happen?      35 years ago If all above answers are "NO", may proceed with cephalosporin use.        Medication List     STOP taking these medications    ondansetron 4 MG tablet Commonly known as: Zofran       TAKE these medications    acetaminophen 650 MG CR tablet Commonly known as: TYLENOL Take 1,300 mg by mouth daily as needed for pain.   AMBULATORY NON FORMULARY MEDICATION On continuous oxygen 2 L.  Stationary pulse ox at 88%.  Drops with activity.  Diagnosis restrictive lung disease with hypoxemia. Portable gas via nasal cannula.   amLODipine 2.5 MG tablet Commonly known as: NORVASC Take 1 tablet (2.5 mg total) by mouth daily. Start taking on: June 08, 2022   bumetanide 1 MG tablet Commonly known as: BUMEX Take 1 tablet (1 mg total) by mouth daily.   cetirizine 10 MG tablet Commonly known as: ZYRTEC Take 10 mg by mouth daily.   cholecalciferol 25 MCG (1000 UNIT) tablet Commonly known as: VITAMIN D Take 1,000 Units by mouth every evening.   clobetasol cream 0.05 % Commonly known as: TEMOVATE Apply 1 application topically daily as needed (on affected area(s)). What changed: See the new instructions.   diclofenac Sodium 1 % Gel Commonly known as: VOLTAREN Apply 1 application topically 4 (four) times daily as needed (hip pain.).   docusate sodium 100 MG capsule Commonly known as: COLACE Take 100 mg by mouth 2 (two) times daily.   DULoxetine 30 MG capsule Commonly known as: CYMBALTA Take 30 mg by mouth at bedtime.   estrogens (conjugated) 0.625 MG tablet Commonly known as: Premarin Take 1 tablet (0.625 mg total) by mouth every 3rd day. What changed:  how much to take when to take this additional instructions   ezetimibe 10 MG tablet Commonly known as: ZETIA Take 1 tablet (10 mg total) by mouth daily. Start taking on: June 08, 2022   fentaNYL 12  MCG/HR Commonly known as: [place] 1 patch onto the skin every other day.   ferrous sulfate 325 (65 FE) MG tablet Take 1 tablet (325 mg total) by mouth every Monday, Wednesday, and Friday.   lansoprazole 30 MG capsule Commonly known as: PREVACID Take 1 capsule (30 mg total) by mouth daily.   levothyroxine 200 MCG tablet Commonly known as: SYNTHROID Take 1 tablet (200 mcg total) by mouth daily before breakfast.   Mepolizumab 100 MG/ML Soaj Inject 300 mg into the skin every 28 (twenty-eight) days.   metoprolol succinate 25 MG 24 hr tablet Commonly known as: TOPROL-XL TAKE 1 TABLET IN THE MORNING AND AT BEDTIME   multivitamin with minerals Tabs tablet Take 1 tablet by mouth every evening.   nystatin powder Commonly known as: MYCOSTATIN/NYSTOP Apply 1 application. topically 3 (three) times daily. What changed:  when to take this reasons to take this   nystatin ointment Commonly known as: MYCOSTATIN Apply 1 application. topically 2 (two) times daily. What changed:  when to take this reasons to take this   oxyCODONE 5 MG immediate release tablet Commonly known as: Oxy IR/ROXICODONE Take 5 mg by mouth 3 (three) times daily as needed (pain.).   OXYGEN Inhale  2 L into the lungs at bedtime.   predniSONE 5 MG tablet Commonly known as: DELTASONE Take 3 tablets (15 mg total) by mouth daily. What changed: how much to take   pregabalin 75 MG capsule Commonly known as: LYRICA Take 1 capsule (75 mg total) by mouth 2 (two) times daily.   sennosides-docusate sodium 8.6-50 MG tablet Commonly known as: SENOKOT-S Take 1-2 tablets by mouth at bedtime.   sucralfate 1 GM/10ML suspension Commonly known as: CARAFATE Take 10 mLs (1 g total) by mouth 4 (four) times daily - with meals and at bedtime. What changed:  when to take this reasons to take this   TART CHERRY PO Take 1 tablet by mouth at bedtime.   vitamin B-12 500 MCG tablet Commonly known as:  CYANOCOBALAMIN Take 500 mcg by mouth every Monday, Wednesday, and Friday.   Xarelto 15 MG Tabs tablet Generic drug: Rivaroxaban TAKE 1 TABLET DAILY WITH SUPPER What changed: See the new instructions.        Allergies  Allergen Reactions   Allopurinol Palpitations   Uloric [Febuxostat] Other (See Comments)    Episodes of AFIB   Ace Inhibitors Other (See Comments)    Renal function   Colchicine Other (See Comments)    palpitations   Dilaudid [Hydromorphone] Other (See Comments)    Decreased respiratory drive   Etodolac Other (See Comments)    heart flutters and headaches    Omeprazole Nausea Only   Paroxetine Nausea Only    Dizzy   Repatha [Evolocumab]     Palpitations/AFib/pain   Solu-Medrol [Methylprednisolone Sodium Succ]     Heart palpitations/bad headache   Statins Tinitus    myalgias    Sulfa Antibiotics Other (See Comments)    Gout flare   Lasix [Furosemide] Rash    Does not work very well   Penicillins Rash    Tolerated Ceftriaxone 4/10-4/11/20 \ Did it involve swelling of the face/tongue/throat, SOB, or low BP? No Did it involve sudden or severe rash/hives, skin peeling, or any reaction on the inside of your mouth or nose? No Did you need to seek medical attention at a hospital or doctor's office? No When did it last happen?      35 years ago If all above answers are "NO", may proceed with cephalosporin use.    Consultations: Cardiology   Procedures/Studies: CARDIAC CATHETERIZATION  Result Date: 06/07/2022 1.  Patent coronary arteries with no significant stenoses (right dominant) 2.  Mild pulmonary hypertension with mean PA pressure of 30 mmHg in the setting of a normal LVEDP of 14 mmHg and pulmonary wedge pressure of 17 mmHg 3.  Preserved cardiac output of 5.5 L/min with cardiac index of 2.6 L/min/m Unclear etiology of the patient's elevated troponin.  Differential diagnosis includes demand ischemia versus stress-induced event seem to be the most likely  possibilities.  Recommend ongoing medical treatment.  Okay to resume rivaroxaban this evening.   ECHOCARDIOGRAM COMPLETE  Result Date: 06/04/2022    ECHOCARDIOGRAM REPORT   Patient Name:   Nicole Norton Date of Exam: 06/04/2022 Medical Rec #:  [id number]      Height:       66.0 in Accession #:    [id number]     Weight:       239.4 lb Date of Birth:  04/12/1941      BSA:          2.159 m Patient Age:    45 years       BP:  93/75 mmHg Patient Gender: F              HR:           69 bpm. Exam Location:  Inpatient Procedure: 2D Echo, Cardiac Doppler and Color Doppler Indications:    Chest pain  History:        Patient has prior history of Echocardiogram examinations, most                 recent 05/20/2020. Previous Myocardial Infarction,                 Arrythmias:Atrial Fibrillation and Atrial Flutter; Risk                 Factors:Dyslipidemia, Hypertension and Obesity. Thyroid disease.  Sonographer:    Eartha Inch Referring Phys: [id number] ZSWFU [place]  Sonographer Comments: Image acquisition challenging due to respiratory motion. IMPRESSIONS  1. Left ventricular ejection fraction, by estimation, is 60 to 65%. The left ventricle has normal function. The left ventricle has no regional wall motion abnormalities. Left ventricular diastolic parameters are consistent with Grade II diastolic dysfunction (pseudonormalization). Elevated left ventricular end-diastolic pressure.  2. Right ventricular systolic function is normal. The right ventricular size is normal. There is normal pulmonary artery systolic pressure.  3. Left atrial size was severely dilated.  4. The mitral valve is abnormal. Trivial mitral valve regurgitation. No evidence of mitral stenosis. Moderate mitral annular calcification.  5. Tricuspid valve regurgitation is moderate.  6. The aortic valve is normal in structure. There is mild calcification of the aortic valve. There is mild thickening of the aortic valve. Aortic valve regurgitation is  trivial. Aortic valve sclerosis is present, with no evidence of aortic valve stenosis.  7. The inferior vena cava is normal in size with <50% respiratory variability, suggesting right atrial pressure of 8 mmHg. FINDINGS  Left Ventricle: Left ventricular ejection fraction, by estimation, is 60 to 65%. The left ventricle has normal function. The left ventricle has no regional wall motion abnormalities. The left ventricular internal cavity size was normal in size. There is  no left ventricular hypertrophy. Left ventricular diastolic parameters are consistent with Grade II diastolic dysfunction (pseudonormalization). Elevated left ventricular end-diastolic pressure. Right Ventricle: The right ventricular size is normal. No increase in right ventricular wall thickness. Right ventricular systolic function is normal. There is normal pulmonary artery systolic pressure. The tricuspid regurgitant velocity is 2.71 m/s, and  with an assumed right atrial pressure of 3 mmHg, the estimated right ventricular systolic pressure is 93.2 mmHg. Left Atrium: Left atrial size was severely dilated. Right Atrium: Right atrial size was normal in size. Pericardium: There is no evidence of pericardial effusion. Mitral Valve: The mitral valve is abnormal. There is mild thickening of the mitral valve leaflet(s). There is mild calcification of the mitral valve leaflet(s). Moderate mitral annular calcification. Trivial mitral valve regurgitation. No evidence of mitral valve stenosis. Tricuspid Valve: The tricuspid valve is normal in structure. Tricuspid valve regurgitation is moderate . No evidence of tricuspid stenosis. Aortic Valve: The aortic valve is normal in structure. There is mild calcification of the aortic valve. There is mild thickening of the aortic valve. Aortic valve regurgitation is trivial. Aortic valve sclerosis is present, with no evidence of aortic valve stenosis. Pulmonic Valve: The pulmonic valve was normal in structure.  Pulmonic valve regurgitation is not visualized. No evidence of pulmonic stenosis. Aorta: The aortic root is normal in size and structure. Venous: The inferior vena cava is normal  in size with less than 50% respiratory variability, suggesting right atrial pressure of 8 mmHg. IAS/Shunts: No atrial level shunt detected by color flow Doppler.  LEFT VENTRICLE PLAX 2D LVIDd:         5.30 cm   Diastology LVIDs:         3.30 cm   LV e' medial:    4.64 cm/s LV PW:         1.00 cm   LV E/e' medial:  22.8 LV IVS:        1.00 cm   LV e' lateral:   6.31 cm/s LVOT diam:     1.90 cm   LV E/e' lateral: 16.8 LVOT Area:     2.84 cm  RIGHT VENTRICLE             IVC RV S prime:     11.00 cm/s  IVC diam: 1.80 cm TAPSE (M-mode): 1.8 cm LEFT ATRIUM           Index        RIGHT ATRIUM           Index LA diam:      5.10 cm 2.36 cm/m   RA Area:     17.00 cm LA Vol (A2C): 80.0 ml 37.06 ml/m  RA Volume:   48.90 ml  22.65 ml/m   AORTA Ao Root diam: 3.10 cm Ao Asc diam:  3.70 cm MITRAL VALVE                TRICUSPID VALVE MV Area (PHT): 5.27 cm     TR Peak grad:   29.4 mmHg MV Decel Time: 144 msec     TR Vmax:        271.00 cm/s MV E velocity: 105.70 cm/s MV A velocity: 44.00 cm/s   SHUNTS MV E/A ratio:  2.40         Systemic Diam: 1.90 cm Jenkins Rouge MD Electronically signed by Jenkins Rouge MD Signature Date/Time: 06/04/2022/2:27:09 PM    Final    DG Chest 2 View  Result Date: 06/03/2022 CLINICAL DATA:  Dyspnea EXAM: CHEST - 2 VIEW COMPARISON:  Radiographs 05/19/2020 FINDINGS: No significant change from radiographs 05/19/2020. Low lung volumes accentuated cardiomediastinal silhouette and pulmonary vascular markings. Cardiomegaly. Left basilar atelectasis. No pleural effusion or pneumothorax. Aortic calcification. IMPRESSION: Stable chest exam.  No interval change or acute process. Electronically Signed   By: Placido Sou M.D.   On: 06/03/2022 14:22   (Echo, Carotid, EGD, Colonoscopy, ERCP)    Subjective: Patient was seen and  examined in the morning rounds.  Later today she underwent cardiac cath and found to have normal coronaries.  Patient without ongoing pain and mostly improved today.  She does have a lot of musculoskeletal pain.   Discharge Exam: Vitals:   06/07/22 1022 06/07/22 1114  BP: (!) 156/77 (!) 152/75  Pulse: 60   Resp: 16   Temp: 97.7 F (36.5 C)   SpO2: 98%    Vitals:   06/07/22 0950 06/07/22 1000 06/07/22 1022 06/07/22 1114  BP: (!) 162/88  (!) 156/77 (!) 152/75  Pulse: 67  60   Resp: '15 16 16   '$ Temp:   97.7 F (36.5 C)   TempSrc:   Oral   SpO2: 95%  98%   Weight:      Height:        General: Pt is alert, awake, not in acute distress Pleasant.  Interactive. Comfortable on 2 L oxygen. Cardiovascular:  RRR, S1/S2 +, no rubs, no gallops Respiratory: CTA bilaterally, no wheezing, no rhonchi Abdominal: Soft, NT, ND, bowel sounds + Extremities: no edema, no cyanosis, obese and nonpitting edema.    The results of significant diagnostics from this hospitalization (including imaging, microbiology, ancillary and laboratory) are listed below for reference.     Microbiology: Recent Results (from the past 240 hour(s))  SARS Coronavirus 2 by RT PCR (hospital order, performed in [facility] hospital lab) *cepheid single result test* Anterior Nasal Swab     Status: None   Collection Time: 06/03/22  1:33 PM   Specimen: Anterior Nasal Swab  Result Value Ref Range Status   SARS Coronavirus 2 by RT PCR NEGATIVE NEGATIVE Final    Comment: (NOTE) SARS-CoV-2 target nucleic acids are NOT DETECTED.  The SARS-CoV-2 RNA is generally detectable in upper and lower respiratory specimens during the acute phase of infection. The lowest concentration of SARS-CoV-2 viral copies this assay can detect is 250 copies / mL. A negative result does not preclude SARS-CoV-2 infection and should not be used as the sole basis for treatment or other patient management decisions.  A negative result may occur  with improper specimen collection / handling, submission of specimen other than nasopharyngeal swab, presence of viral mutation(s) within the areas targeted by this assay, and inadequate number of viral copies (<250 copies / mL). A negative result must be combined with clinical observations, patient history, and epidemiological information.  Fact Sheet for Patients:   https://www.patel.info/  Fact Sheet for Healthcare Providers: https://hall.com/  This test is not yet approved or  cleared by the [place] FDA and has been authorized for detection and/or diagnosis of SARS-CoV-2 by FDA under an Emergency Use Authorization (EUA).  This EUA will remain in effect (meaning this test can be used) for the duration of the COVID-19 declaration under Section 564(b)(1) of the Act, 21 U.S.C. section 360bbb-3(b)(1), unless the authorization is terminated or revoked sooner.  Performed at [facility], [place] [address] [place], [place] [address]      Labs: BNP (last 3 results) Recent Labs    06/03/22 1333  BNP 456.2*   Basic Metabolic Panel: Recent Labs  Lab 06/03/22 1333 06/03/22 1633 06/04/22 0252 06/05/22 0612 06/06/22 0547 06/07/22 0536  NA 145 140 140 140 144 145  K 3.7 3.9 3.8 3.5 3.6 3.6  CL 105  --  104 104 109 103  CO2 27  --  '25 27 27 29  '$ GLUCOSE 98  --  139* 96 85 83  BUN 32*  --  41* 36* 19 15  CREATININE 1.99*  --  2.56* 1.79* 1.34* 1.30*  CALCIUM 8.3*  --  8.4* 8.8* 9.1 9.5   Liver Function Tests: No results for input(s): "AST", "ALT", "ALKPHOS", "BILITOT", "PROT", "ALBUMIN" in the last 168 hours. No results for input(s): "LIPASE", "AMYLASE" in the last 168 hours. No results for input(s): "AMMONIA" in the last 168 hours. CBC: Recent Labs  Lab 06/03/22 1333 06/03/22 1633 06/04/22 0252 06/05/22 0612 06/06/22 0547 06/07/22 0536  WBC 15.2*  --  9.9 6.4 5.6 5.6  NEUTROABS 13.2*  --   --   --   --   --   HGB  12.2 12.2 12.1 11.6* 11.0* 11.2*  HCT 38.0 36.0 37.4 35.7* 34.5* 34.2*  MCV 100.5*  --  100.8* 100.8* 100.9* 99.7  PLT 175  --  144* 135* 145* 143*   Cardiac Enzymes: No results for input(s): "CKTOTAL", "CKMB", "CKMBINDEX", "  TROPONINI" in the last 168 hours. BNP: Invalid input(s): "POCBNP" CBG: No results for input(s): "GLUCAP" in the last 168 hours. D-Dimer No results for input(s): "DDIMER" in the last 72 hours. Hgb A1c No results for input(s): "HGBA1C" in the last 72 hours. Lipid Profile Recent Labs    06/05/22 0612  CHOL 264*  HDL 60  LDLCALC 145*  TRIG 294*  CHOLHDL 4.4   Thyroid function studies No results for input(s): "TSH", "T4TOTAL", "T3FREE", "THYROIDAB" in the last 72 hours.  Invalid input(s): "FREET3" Anemia work up No results for input(s): "VITAMINB12", "FOLATE", "FERRITIN", "TIBC", "IRON", "RETICCTPCT" in the last 72 hours. Urinalysis    Component Value Date/Time   COLORURINE AMBER (A) 06/03/2022 1749   APPEARANCEUR CLOUDY (A) 06/03/2022 1749   LABSPEC 1.021 06/03/2022 1749   PHURINE 5.0 06/03/2022 1749   GLUCOSEU NEGATIVE 06/03/2022 1749   HGBUR SMALL (A) 06/03/2022 1749   BILIRUBINUR NEGATIVE 06/03/2022 1749   BILIRUBINUR small (A) 07/23/2021 1436   BILIRUBINUR small 09/28/2018 1628   KETONESUR NEGATIVE 06/03/2022 1749   PROTEINUR 100 (A) 06/03/2022 1749   UROBILINOGEN 1.0 07/23/2021 1436   NITRITE NEGATIVE 06/03/2022 1749   LEUKOCYTESUR NEGATIVE 06/03/2022 1749   Sepsis Labs Recent Labs  Lab 06/04/22 0252 06/05/22 0612 06/06/22 0547 06/07/22 0536  WBC 9.9 6.4 5.6 5.6   Microbiology Recent Results (from the past 240 hour(s))  SARS Coronavirus 2 by RT PCR (hospital order, performed in [facility] hospital lab) *cepheid single result test* Anterior Nasal Swab     Status: None   Collection Time: 06/03/22  1:33 PM   Specimen: Anterior Nasal Swab  Result Value Ref Range Status   SARS Coronavirus 2 by RT PCR NEGATIVE NEGATIVE Final     Comment: (NOTE) SARS-CoV-2 target nucleic acids are NOT DETECTED.  The SARS-CoV-2 RNA is generally detectable in upper and lower respiratory specimens during the acute phase of infection. The lowest concentration of SARS-CoV-2 viral copies this assay can detect is 250 copies / mL. A negative result does not preclude SARS-CoV-2 infection and should not be used as the sole basis for treatment or other patient management decisions.  A negative result may occur with improper specimen collection / handling, submission of specimen other than nasopharyngeal swab, presence of viral mutation(s) within the areas targeted by this assay, and inadequate number of viral copies (<250 copies / mL). A negative result must be combined with clinical observations, patient history, and epidemiological information.  Fact Sheet for Patients:   https://www.patel.info/  Fact Sheet for Healthcare Providers: https://hall.com/  This test is not yet approved or  cleared by the [place] FDA and has been authorized for detection and/or diagnosis of SARS-CoV-2 by FDA under an Emergency Use Authorization (EUA).  This EUA will remain in effect (meaning this test can be used) for the duration of the COVID-19 declaration under Section 564(b)(1) of the Act, 21 U.S.C. section 360bbb-3(b)(1), unless the authorization is terminated or revoked sooner.  Performed at [facility], [place] [address], [place], [place] [address]      Time coordinating discharge: 35 minutes  SIGNED:   Barb Merino, MD  [facility] 06/07/2022, 1:09 PM

## 2022-06-07 NOTE — Progress Notes (Signed)
Progress Note  Patient Name: Nicole Norton Date of Encounter: 06/07/2022  [facility] Cardiologist: Quay Burow, MD   Subjective   Feels well. Seen post cath.   Inpatient Medications    Scheduled Meds:  cholecalciferol  1,000 Units Oral QPM   cyanocobalamin  500 mcg Oral Q M,W,F   docusate sodium  100 mg Oral BID   DULoxetine  30 mg Oral QHS   fentaNYL  1 patch Transdermal Q72H   levothyroxine  200 mcg Oral QAC breakfast   metoprolol succinate  25 mg Oral Daily   pantoprazole  20 mg Oral Daily   predniSONE  15 mg Oral Daily   pregabalin  75 mg Oral BID   rivaroxaban  15 mg Oral Q supper   sodium chloride flush  3 mL Intravenous Q12H   Continuous Infusions:  sodium chloride 75 mL/hr at 06/07/22 1025   sodium chloride     PRN Meds: sodium chloride, acetaminophen **OR** acetaminophen, diclofenac Sodium, diphenhydrAMINE, hydrALAZINE, labetalol, nitroGLYCERIN, ondansetron (ZOFRAN) IV, oxyCODONE, sodium chloride flush   Vital Signs    Vitals:   06/07/22 0945 06/07/22 0950 06/07/22 1000 06/07/22 1022  BP: (!) 154/80 (!) 162/88  (!) 156/77  Pulse: 72 67  60  Resp: '16 15 16 16  '$ Temp:    97.7 F (36.5 C)  TempSrc:    Oral  SpO2: 95% 95%  98%  Weight:      Height:        Intake/Output Summary (Last 24 hours) at 06/07/2022 1113 Last data filed at 06/07/2022 0174 Gross per 24 hour  Intake 822.09 ml  Output --  Net 822.09 ml       06/07/2022    5:12 AM 06/06/2022    5:24 AM 06/05/2022    5:36 AM  Last 3 Weights  Weight (lbs) 237 lb 6.4 oz 238 lb 9.6 oz 241 lb 4.8 oz  Weight (kg) 107.684 kg 108.228 kg 109.453 kg      Telemetry    Personally Reviewed  ECG    Personally Reviewed  Physical Exam   GEN: No acute distress.  Obese. Neck: No JVD Cardiac: RRR, no murmurs, rubs, or gallops.  Respiratory: Clear to auscultation bilaterally. GI: Soft, nontender, non-distended  MS: No edema; No deformity. Neuro:  Nonfocal  Psych: Normal affect   Labs     High Sensitivity Troponin:   Recent Labs  Lab 06/03/22 1333 06/03/22 1601  TROPONINIHS 1,546* 1,942*      Chemistry Recent Labs  Lab 06/05/22 0612 06/06/22 0547 06/07/22 0536  NA 140 144 145  K 3.5 3.6 3.6  CL 104 109 103  CO2 '27 27 29  '$ GLUCOSE 96 85 83  BUN 36* 19 15  CREATININE 1.79* 1.34* 1.30*  CALCIUM 8.8* 9.1 9.5  GFRNONAA 29* 41* 42*  ANIONGAP '9 8 13     '$ Lipids  Recent Labs  Lab 06/05/22 0612  CHOL 264*  TRIG 294*  HDL 60  LDLCALC 145*  CHOLHDL 4.4     Hematology Recent Labs  Lab 06/05/22 0612 06/06/22 0547 06/07/22 0536  WBC 6.4 5.6 5.6  RBC 3.54* 3.42* 3.43*  HGB 11.6* 11.0* 11.2*  HCT 35.7* 34.5* 34.2*  MCV 100.8* 100.9* 99.7  MCH 32.8 32.2 32.7  MCHC 32.5 31.9 32.7  RDW 14.6 14.6 14.7  PLT 135* 145* 143*    Thyroid No results for input(s): "TSH", "FREET4" in the last 168 hours.  BNP Recent Labs  Lab 06/03/22 1333  BNP 843.0*     DDimer No results for input(s): "DDIMER" in the last 168 hours.   Radiology    CARDIAC CATHETERIZATION  Result Date: 06/07/2022 1.  Patent coronary arteries with no significant stenoses (right dominant) 2.  Mild pulmonary hypertension with mean PA pressure of 30 mmHg in the setting of a normal LVEDP of 14 mmHg and pulmonary wedge pressure of 17 mmHg 3.  Preserved cardiac output of 5.5 L/min with cardiac index of 2.6 L/min/m Unclear etiology of the patient's elevated troponin.  Differential diagnosis includes demand ischemia versus stress-induced event seem to be the most likely possibilities.  Recommend ongoing medical treatment.  Okay to resume rivaroxaban this evening.    Procedures  RIGHT/LEFT HEART CATH AND CORONARY ANGIOGRAPHY   Conclusion  1.  Patent coronary arteries with no significant stenoses (right dominant) 2.  Mild pulmonary hypertension with mean PA pressure of 30 mmHg in the setting of a normal LVEDP of 14 mmHg and pulmonary wedge pressure of 17 mmHg 3.  Preserved cardiac output of 5.5  L/min with cardiac index of 2.6 L/min/m   Unclear etiology of the patient's elevated troponin.  Differential diagnosis includes demand ischemia versus stress-induced event seem to be the most likely possibilities.  Recommend ongoing medical treatment.  Okay to resume rivaroxaban this evening.  Coronary Diagrams  Diagnostic Dominance: Right  Intervention   Assessment & Plan     MINOCA- no significant CAD on cath. EF normal by Echo. Mild pulmonary HTN. Good LV filling pressures.  Etiology is likely demand ischemia but inciting event unclear.  Plan to resume Xarelto so will forgo antiplatelet therapy.  Optimize BP control. Given statin intolerance would start Zetia.  Continue beta blocker  HFpEF Echo with normal LV function Volume status OK by cath.  pAF On xarelto as outpatient will resume Continue metoprolol  HTN On metoprolol. Still not optimal. Several drug intolerances.  Would add amlodipine 2.5 mg daily  [facility] will sign off.   Medication Recommendations:  as noted add Zetia 10 mg and amlodipine 2.5 mg daily. OK to resume Xarelto this evening Other recommendations (labs, testing, etc):  none Follow up as an outpatient:  with Dr Quay Burow MD in 2-3 weeks.     For questions or updates, please contact [facility] Please consult [email or web address] for contact info under        Signed, Ayden Hardwick [place], MD  06/07/2022, 11:13 AM

## 2022-06-07 NOTE — TOC Transition Note (Signed)
Transition of Care [facility]) - CM/SW Discharge Note   Patient Details  Name: Nicole Norton MRN: [id number] Date of Birth: 11-28-1940  [facility] CM/SW Contact:  Zenon Mayo, RN Phone Number: 06/07/2022, 1:20 PM   Clinical Narrative:     Patient is for dc today, she states she has a walker, cane, rollator, and portable oxygen.  She states she uses oxygen only at night. Her caregiver is at the [facility],  she is with patient on M, T, W, F from 9 am to 5pm or when she needs her.  She will be transporting patient home today.         Patient Goals and CMS Choice        Discharge Placement                       Discharge Plan and Services                                     Social Determinants of Health (SDOH) Interventions     Readmission Risk Interventions     No data to display

## 2022-06-07 NOTE — Interval H&P Note (Signed)
History and Physical Interval Note:  06/07/2022 9:24 AM  Nicole Norton  has presented today for surgery, with the diagnosis of nstemi.  The various methods of treatment have been discussed with the patient and family. After consideration of risks, benefits and other options for treatment, the patient has consented to  Procedure(s): RIGHT/LEFT HEART CATH AND CORONARY ANGIOGRAPHY (N/A) as a surgical intervention.  The patient's history has been reviewed, patient examined, no change in status, stable for surgery.  I have reviewed the patient's chart and labs.  Questions were answered to the patient's satisfaction.     [name]

## 2022-06-08 ENCOUNTER — Ambulatory Visit: Payer: Medicare Other | Admitting: Cardiology

## 2022-06-08 ENCOUNTER — Telehealth: Payer: Self-pay

## 2022-06-08 NOTE — Patient Outreach (Signed)
  Care Coordination TOC Note Transition Care Management Unsuccessful Follow-up Telephone Call  Date of discharge and from where:  Nicole Norton 06/03/22-06/07/22  Attempts:  1st Attempt  Reason for unsuccessful TCM follow-up call:  Left voice message

## 2022-06-08 NOTE — Patient Outreach (Signed)
  Care Coordination TOC Note Transition Care Management Follow-up Telephone Call Date of discharge and from where: [facility] 06/03/22-06/07/22 How have you been since you were released from the hospital? "I am feeling well, very tired.  I was at the hospital four days so it was exhausting". Any questions or concerns? No  Items Reviewed: Did the pt receive and understand the discharge instructions provided? Yes  Medications obtained and verified? Yes  Other? No  Any new allergies since your discharge? No  Dietary orders reviewed? No Do you have support at home? Yes -Patient friend is staying with her for a few days.  Home Care and Equipment/Supplies: Were home health services ordered? no If so, what is the name of the agency? N/A  Has the agency set up a time to come to the patient's home? not applicable Were any new equipment or medical supplies ordered?  No What is the name of the medical supply agency? N/A Were you able to get the supplies/equipment? not applicable Do you have any questions related to the use of the equipment or supplies? No  Functional Questionnaire: (I = Independent and D = Dependent) ADLs: I  Bathing/Dressing- I  Meal Prep- D  Eating- I  Maintaining continence- I  Transferring/Ambulation- I  Managing Meds- I  Follow up appointments reviewed:  PCP Hospital f/u appt confirmed? Yes  Scheduled to see Coletta Memos, NP on 06/21/22 @ 10:05. [facility] f/u appt confirmed? No  Patient is going to call and set up cardiology. Are transportation arrangements needed? No  If their condition worsens, is the pt aware to call PCP or go to the Emergency Dept.? Yes Was the patient provided with contact information for the PCP's office or ED? Yes Was to pt encouraged to call back with questions or concerns? Yes  SDOH assessments and interventions completed:   No  Care Coordination Interventions Activated:  No Care Coordination Interventions:   No  interventions at this time.  Encounter Outcome:  Pt. Visit Completed
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
## 2022-06-21 ENCOUNTER — Encounter: Payer: Self-pay | Admitting: General Practice

## 2022-06-21 ENCOUNTER — Ambulatory Visit (INDEPENDENT_AMBULATORY_CARE_PROVIDER_SITE_OTHER): Payer: Medicare Other | Admitting: General Practice

## 2022-06-21 VITALS — BP 138/78 | HR 52 | Ht 66.0 in | Wt 239.4 lb

## 2022-06-21 DIAGNOSIS — I5032 Chronic diastolic (congestive) heart failure: Secondary | ICD-10-CM | POA: Diagnosis not present

## 2022-06-21 DIAGNOSIS — I4819 Other persistent atrial fibrillation: Secondary | ICD-10-CM

## 2022-06-21 DIAGNOSIS — N183 Chronic kidney disease, stage 3 unspecified: Secondary | ICD-10-CM | POA: Diagnosis not present

## 2022-06-21 DIAGNOSIS — E785 Hyperlipidemia, unspecified: Secondary | ICD-10-CM | POA: Diagnosis not present

## 2022-06-21 DIAGNOSIS — I10 Essential (primary) hypertension: Secondary | ICD-10-CM | POA: Diagnosis not present

## 2022-06-21 DIAGNOSIS — I2510 Atherosclerotic heart disease of native coronary artery without angina pectoris: Secondary | ICD-10-CM

## 2022-06-21 NOTE — Patient Instructions (Signed)
Medication Instructions:  The current medical regimen is effective;  continue present plan and medications as directed. Please refer to the Current Medication list given to you today.   *If you need a refill on your cardiac medications before your next appointment, please call your pharmacy*  Lab Work:   Testing/Procedures:  NON    NONE  If you have labs (blood work) drawn today and your tests are completely normal, you will receive your results only by: [place] (if you have MyChart) OR  A paper copy in the mail If you have any lab test that is abnormal or we need to change your treatment, we will call you to review the results.  Special Instructions SEE ATTACHED POTASSIUM FOODS  PLEASE INCREASE PHYSICAL ACTIVITY AS TOLERATED   TAKE AND LOG YOUR BLOOD PRESSURE  Follow-Up: Your next appointment:  3-4 month(s) In Person with Quay Burow, MD   Please call our office 2 months in advance to schedule this appointment  :1  At [facility], you and your health needs are our priority.  As part of our continuing mission to provide you with exceptional heart care, we have created designated Provider Care Teams.  These Care Teams include your primary Cardiologist (physician) and Advanced Practice Providers (APPs -  Physician Assistants and Nurse Practitioners) who all work together to provide you with the care you need, when you need it.  Important Information About Sugar          Potassium Content of Foods  Potassium is a mineral found in many foods and drinks. It can affect how the heart works, affect blood pressure, and keep fluids and electrolytes balanced in the body. It is important not to have too much potassium (hyperkalemia) or too little potassium (hypokalemia) in the body, especially in the blood. Potassium is naturally found in many different types of whole foods, such as fruits, vegetables, meat, and dairy products. Processed foods tend to be lower in potassium. The  amount of potassium you need each day depends on your age and any medical conditions you may have. General recommendations are: Females aged 27 and older: 2,600 mg per day. Males aged 23 and older: 3,400 mg per day. Talk with your health care provider or dietitian about how much potassium you need. What foods are high in potassium? Below are examples of foods that have greater than 200 mg of potassium per serving. Fruits Orange -- 1 medium (130 g) has 230 mg of potassium. Banana -- 1 medium (120 g) has 420 mg of potassium. Cantaloupe, chunks -- 1 cup (160 g) has 430 mg of potassium. Vegetables Potato, baked, without skin -- 1 medium (170 g) has 600 mg of potassium. Broccoli, chopped, cooked --  cup (77.5 g) has 230 mg of potassium. Tomato, chopped or sliced -- 1 cup (161 g) has 400 mg of potassium. Grains Cereal, bran with raisins -- 1 cup (59 g) has 360 mg of potassium. Granola with almonds --  cup (82 g) has 220 mg of potassium. Meats and other proteins Ground beef patty -- 4 ounces (113 g) has 240 mg of potassium. Kidney beans, boiled --  cup (130 g) has 350 mg of potassium. Almonds -- 1 ounce (approximately 22 nuts or 28 g) has 200 mg of potassium. Dairy Cow's milk, 1% -- 1 cup (237 mL) has 360 mg of potassium. Plain vanilla low-fat yogurt --  cup (184 g) has 220 mg of potassium. The items listed above may not be a  complete list of foods high in potassium. Actual amounts of potassium may be different depending on ripeness, shelf life, and food preparation. Contact a dietitian for more information. What foods are low in potassium? Below are examples of foods that have less than 200 mg of potassium per serving. Fruits Blueberries -- 1 cup (145 g) has 110 mg of potassium. Apple -- 1 medium (140 g) has 145 mg of potassium. Grapes -- 1 cup (160 g) has 175 mg of potassium. Vegetables Cabbage, raw -- 1 cup (70 g) has 120 mg of potassium. Cauliflower, chopped, cooked -- 1 cup (180 g)  has 90 mg of potassium. Romaine lettuce, chopped -- 1 cup (56 g) has 120 mg of potassium. Grains Bagel, plain -- one 4-inch (10 cm) has 100 mg of potassium. Whole wheat bread -- 1 slice (26 g) has 70 mg of potassium. White rice, cooked -- 1 cup (163 g) has 50 mg of potassium. Meats and other proteins Tuna, light, canned in water -- 3 ounces (85 g) has 150 mg of potassium. Egg, fried -- 1 large (50 g) has 60 mg of potassium. Peanuts --1 ounce (35 nuts or 28 g) has 180 mg of potassium. Tofu --  cup (252 g) has 150 mg of potassium. Dairy Cheese (cheddar, colby, mozzarella, or provolone) -- 1 ounce (28 g) has 30 to 40 mg of potassium. The items listed above may not be a complete list of foods that are low in potassium. Actual amounts of potassium may be different depending on ripeness, shelf life, and food preparation. Contact a dietitian for more information. Summary Potassium is a mineral found in many foods and drinks. It affects how the heart works, affects blood pressure, and keeps fluids and electrolytes balanced in the body. The amount of potassium you need each day depends on your age and any existing medical conditions you may have. Your health care provider or dietitian may recommend an amount of potassium that you should have each day. This information is not intended to replace advice given to you by your health care provider. Make sure you discuss any questions you have with your health care provider. Document Revised: 08/04/2021 Document Reviewed: 07/16/2021 Elsevier Patient Education  [place].

## 2022-06-22 ENCOUNTER — Encounter: Payer: Self-pay | Admitting: Family Medicine

## 2022-06-22 ENCOUNTER — Ambulatory Visit (INDEPENDENT_AMBULATORY_CARE_PROVIDER_SITE_OTHER): Payer: Medicare Other | Admitting: Family Medicine

## 2022-06-22 VITALS — BP 144/84 | HR 65 | Ht 66.0 in | Wt 239.0 lb

## 2022-06-22 DIAGNOSIS — I5022 Chronic systolic (congestive) heart failure: Secondary | ICD-10-CM | POA: Diagnosis not present

## 2022-06-22 DIAGNOSIS — I10 Essential (primary) hypertension: Secondary | ICD-10-CM | POA: Diagnosis not present

## 2022-06-22 DIAGNOSIS — I2510 Atherosclerotic heart disease of native coronary artery without angina pectoris: Secondary | ICD-10-CM

## 2022-06-22 DIAGNOSIS — T50905A Adverse effect of unspecified drugs, medicaments and biological substances, initial encounter: Secondary | ICD-10-CM

## 2022-06-22 DIAGNOSIS — F418 Other specified anxiety disorders: Secondary | ICD-10-CM | POA: Diagnosis not present

## 2022-06-22 DIAGNOSIS — E039 Hypothyroidism, unspecified: Secondary | ICD-10-CM

## 2022-06-22 MED ORDER — DULOXETINE HCL 60 MG PO CPEP: 60.0000 mg | ORAL_CAPSULE | Freq: Every day | ORAL | 1 refills | Status: AC

## 2022-06-22 NOTE — Assessment & Plan Note (Signed)
Pressure a little elevated today.  It looked normal yesterday so we will just monitor carefully.

## 2022-06-22 NOTE — Progress Notes (Signed)
Established Patient Office Visit  Subjective   Patient ID: Nicole Norton, female    DOB: 23-Aug-1941  Age: 81 y.o. MRN: [id number]  Chief Complaint  Patient presents with   Hospitalization Follow-up    HPI  Here today for hospital follow-up she was admitted on July 20 for neck and back pain with elevated troponin levels.  She did undergo cardiac catheterization was found to have normal coronaries.  She was discharged home on July 24.  She was diagnosed with non-ST elevation MI.  Amlodipine to 2.5 mg was added to her medication list. She was also d/c on Zetia as well.  He also diuresed her as she was having an acute on chronic congestive heart failure episode as well.  She is actually feeling well and doing well.  She did try to take the Zetia but unfortunately experienced some nausea and dizziness with it and so has stopped that.  She has been tolerating the amlodipine 2.5 mg well without any problems.  She does report that she just feels tense all the time just like she cannot quite relax.  She used to take benzodiazepines regularly and says that used to help.  But she feels like she constantly has to move or fidget.  She wonders if switching to Prozac would be helpful.  She is currently on Cymbalta.  She had taken Prozac years ago.  She is currently on the Cymbalta more for chronic musculoskeletal pain.     ROS    Objective:     BP (!) 144/84   Pulse 65   Ht '5\' 6"'$  (1.676 m)   Wt 239 lb (108.4 kg)   SpO2 99%   BMI 38.58 kg/m    Physical Exam Vitals and nursing note reviewed.  Constitutional:      Appearance: She is well-developed.  HENT:     Head: Normocephalic and atraumatic.  Cardiovascular:     Rate and Rhythm: Normal rate and regular rhythm.     Heart sounds: Normal heart sounds.  Pulmonary:     Effort: Pulmonary effort is normal.     Breath sounds: Normal breath sounds.  Skin:    General: Skin is warm and dry.  Neurological:     Mental Status: She is alert and  oriented to person, place, and time.  Psychiatric:        Behavior: Behavior normal.      No results found for any visits on 06/22/22.     The ASCVD Risk score (Arnett DK, et al., 2019) failed to calculate for the following reasons:   The patient has a prior MI or stroke diagnosis    Assessment & Plan:   Problem List Items Addressed This Visit       Cardiovascular and Mediastinum   Essential hypertension, benign    Pressure a little elevated today.  It looked normal yesterday so we will just monitor carefully.      Chronic systolic heart failure (HCC) - Primary    Stable.  No sign of volume overload today.  Blood pressure is up a little bit so doing to keep an eye on that.      Relevant Orders   CBC   COMPLETE METABOLIC PANEL WITH GFR   TSH   RESOLVED: CHF (congestive heart failure) (HCC)    Stable.  No sign of volume overload today.  Blood pressure is up a little bit so doing to keep an eye on that.  Endocrine   Hypothyroidism     Plan to recheck TSH and make any adjustments needed.      Relevant Orders   CBC   COMPLETE METABOLIC PANEL WITH GFR   TSH     Other   Depression with anxiety    We discussed options for helping to relieve some of that built-up tension.  We could do a trial of increased Cymbalta.  Also just continuing to find things to do with her hands.  She says she plays games on a device while she watches TV sometimes and that can help.  I really do not want to restart benzodiazepines she has too many contraindications including being on opiates, age, increased risk for falls etc.      Relevant Medications   DULoxetine (CYMBALTA) 60 MG capsule   Other Visit Diagnoses     Medication side effect, initial encounter          Zetia added to intolerance list.  Return in about 4 months (around 10/22/2022).   I spent 42 minutes on the day of the encounter to include pre-visit record review, face-to-face time with the patient and post visit  ordering of test.   Beatrice Lecher, MD

## 2022-06-22 NOTE — Assessment & Plan Note (Signed)
Plan to recheck TSH and make any adjustments needed.

## 2022-06-22 NOTE — Assessment & Plan Note (Signed)
Stable.  No sign of volume overload today.  Blood pressure is up a little bit so doing to keep an eye on that.

## 2022-06-22 NOTE — Assessment & Plan Note (Signed)
We discussed options for helping to relieve some of that built-up tension.  We could do a trial of increased Cymbalta.  Also just continuing to find things to do with her hands.  She says she plays games on a device while she watches TV sometimes and that can help.  I really do not want to restart benzodiazepines she has too many contraindications including being on opiates, age, increased risk for falls etc.

## 2022-06-23 LAB — COMPLETE METABOLIC PANEL WITH GFR
AG Ratio: 1.7 (calc) (ref 1.0–2.5)
ALT: 21 U/L (ref 6–29)
AST: 20 U/L (ref 10–35)
Albumin: 4.1 g/dL (ref 3.6–5.1)
Alkaline phosphatase (APISO): 129 U/L (ref 37–153)
BUN/Creatinine Ratio: 19 (calc) (ref 6–22)
BUN: 24 mg/dL (ref 7–25)
CO2: 32 mmol/L (ref 20–32)
Calcium: 9.7 mg/dL (ref 8.6–10.4)
Chloride: 102 mmol/L (ref 98–110)
Creat: 1.26 mg/dL — ABNORMAL HIGH (ref 0.60–1.00)
Globulin: 2.4 g/dL (calc) (ref 1.9–3.7)
Glucose, Bld: 94 mg/dL (ref 65–99)
Potassium: 4.5 mmol/L (ref 3.5–5.3)
Sodium: 143 mmol/L (ref 135–146)
Total Bilirubin: 0.4 mg/dL (ref 0.2–1.2)
Total Protein: 6.5 g/dL (ref 6.1–8.1)
eGFR: 44 mL/min/{1.73_m2} — ABNORMAL LOW (ref >=60)

## 2022-06-23 LAB — CBC
HCT: 40.5 % (ref 35.0–45.0)
Hemoglobin: 13.3 g/dL (ref 11.7–15.5)
MCH: 32 pg (ref 27.0–33.0)
MCHC: 32.8 g/dL (ref 32.0–36.0)
MCV: 97.6 fL (ref 80.0–100.0)
MPV: 11.2 fL (ref 7.5–12.5)
Platelets: 288 10*3/uL (ref 140–400)
RBC: 4.15 10*6/uL (ref 3.80–5.10)
RDW: 14 % (ref 11.0–15.0)
WBC: 12.1 10*3/uL — ABNORMAL HIGH (ref 3.8–10.8)

## 2022-06-23 LAB — TSH: TSH: 1.48 mIU/L (ref 0.40–4.50)

## 2022-06-23 NOTE — Progress Notes (Signed)
Hi Nicole Norton, hemoglobin looks better at 13.  Kidney function is stable at 1.2.  Liver enzymes are normal.  Thyroid looks perfect at 1.4.  Overall everything looks pretty great!

## 2022-06-30 DIAGNOSIS — M47817 Spondylosis without myelopathy or radiculopathy, lumbosacral region: Secondary | ICD-10-CM | POA: Diagnosis not present

## 2022-06-30 DIAGNOSIS — G894 Chronic pain syndrome: Secondary | ICD-10-CM | POA: Diagnosis not present

## 2022-06-30 DIAGNOSIS — M47812 Spondylosis without myelopathy or radiculopathy, cervical region: Secondary | ICD-10-CM | POA: Diagnosis not present

## 2022-06-30 DIAGNOSIS — M170 Bilateral primary osteoarthritis of knee: Secondary | ICD-10-CM | POA: Diagnosis not present

## 2022-07-07 ENCOUNTER — Encounter: Payer: Self-pay | Admitting: General Practice

## 2022-07-19 ENCOUNTER — Other Ambulatory Visit: Payer: Self-pay | Admitting: Cardiovascular Disease

## 2022-07-20 NOTE — Telephone Encounter (Signed)
Prescription refill request for Xarelto received.  Indication:Afib Last office visit:8/23 Weight:108.4 kg Age:81 Scr:1.26 CrCl:62.97 ml/min  Prescription refilled

## 2022-07-29 DIAGNOSIS — M170 Bilateral primary osteoarthritis of knee: Secondary | ICD-10-CM | POA: Diagnosis not present

## 2022-07-29 DIAGNOSIS — G894 Chronic pain syndrome: Secondary | ICD-10-CM | POA: Diagnosis not present

## 2022-07-29 DIAGNOSIS — M47817 Spondylosis without myelopathy or radiculopathy, lumbosacral region: Secondary | ICD-10-CM | POA: Diagnosis not present

## 2022-07-29 DIAGNOSIS — M47812 Spondylosis without myelopathy or radiculopathy, cervical region: Secondary | ICD-10-CM | POA: Diagnosis not present

## 2022-08-02 ENCOUNTER — Ambulatory Visit (INDEPENDENT_AMBULATORY_CARE_PROVIDER_SITE_OTHER): Payer: Medicare Other | Admitting: Internal Medicine

## 2022-08-02 ENCOUNTER — Encounter: Payer: Self-pay | Admitting: Internal Medicine

## 2022-08-02 VITALS — BP 134/76 | HR 80 | Temp 97.8°F | Ht 66.0 in | Wt 242.4 lb

## 2022-08-02 DIAGNOSIS — Z87891 Personal history of nicotine dependence: Secondary | ICD-10-CM | POA: Diagnosis not present

## 2022-08-02 DIAGNOSIS — J310 Chronic rhinitis: Secondary | ICD-10-CM | POA: Diagnosis not present

## 2022-08-02 DIAGNOSIS — D7218 Eosinophilia in diseases classified elsewhere: Secondary | ICD-10-CM | POA: Diagnosis not present

## 2022-08-02 DIAGNOSIS — M301 Polyarteritis with lung involvement [Churg-Strauss]: Secondary | ICD-10-CM | POA: Diagnosis not present

## 2022-08-02 LAB — PULMONARY FUNCTION TEST
DL/VA % pred: 85 %
DL/VA: 3.45 ml/min/mmHg/L
DLCO cor % pred: 44 %
DLCO cor: 9.05 ml/min/mmHg
DLCO unc % pred: 44 %
DLCO unc: 9.02 ml/min/mmHg
FEF 25-75 Post: 1.8 L/sec
FEF 25-75 Pre: 1.49 L/sec
FEF2575-%Change-Post: 20 %
FEF2575-%Pred-Post: 110 %
FEF2575-%Pred-Pre: 91 %
FEV1-%Change-Post: 5 %
FEV1-%Pred-Post: 63 %
FEV1-%Pred-Pre: 59 %
FEV1-Post: 1.39 L
FEV1-Pre: 1.32 L
FEV1FVC-%Change-Post: 3 %
FEV1FVC-%Pred-Pre: 113 %
FEV6-%Change-Post: 2 %
FEV6-%Pred-Post: 57 %
FEV6-%Pred-Pre: 55 %
FEV6-Post: 1.6 L
FEV6-Pre: 1.56 L
FEV6FVC-%Pred-Post: 105 %
FEV6FVC-%Pred-Pre: 105 %
FVC-%Change-Post: 2 %
FVC-%Pred-Post: 54 %
FVC-%Pred-Pre: 53 %
FVC-Post: 1.6 L
FVC-Pre: 1.56 L
Post FEV1/FVC ratio: 87 %
Post FEV6/FVC ratio: 100 %
Pre FEV1/FVC ratio: 84 %
Pre FEV6/FVC Ratio: 100 %
RV % pred: 75 %
RV: 1.86 L
TLC % pred: 73 %
TLC: 3.92 L

## 2022-08-02 MED ORDER — AZELASTINE HCL 0.1 % NA SOLN: 1.0000 | Freq: Two times a day (BID) | NASAL | 5 refills | Status: AC

## 2022-08-02 NOTE — Progress Notes (Signed)
Full PFT Performed Today  

## 2022-08-02 NOTE — Patient Instructions (Addendum)
Please schedule follow up scheduled with myself in 6 months.  If my schedule is not open yet, we will contact you with a reminder closer to that time. Please call [phone] if you haven't heard from [place] a month before.   Before your next visit I would like you to have:  CT scan sinuses - we will schedule this for you and call you See the immunologist - we weill schedule this for you and call you.   Continue nucala. Continue prednisone 20 mg daily.   Astelin - 1 spray on each side of your nose twice a day for first week, then 1 spray on each side.   Instructions for use: If you also use a saline nasal spray or rinse, use that first. Position the head with the chin slightly tucked. Use the right hand to spray into the left nostril and the right hand to spray into the left nostril.   Point the bottle away from the septum of your nose (cartilage that divides the two sides of your nose).  Hold the nostril closed on the opposite side from where you will spray Spray once and gently sniff to pull the medicine into the higher parts of your nose.  Don't sniff too hard as the medicine will drain down the back of your throat instead. Repeat with a second spray on the same side if prescribed. Repeat on the other side of your nose.   Based on your age and risk factors I recommend covid booster, RSV vaccine and flu shot for you this fall.

## 2022-08-02 NOTE — Patient Instructions (Signed)
Full PFT Performed Today  
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
## 2022-08-04 ENCOUNTER — Other Ambulatory Visit: Payer: Self-pay | Admitting: Family Medicine

## 2022-08-04 DIAGNOSIS — K219 Gastro-esophageal reflux disease without esophagitis: Secondary | ICD-10-CM

## 2022-08-10 ENCOUNTER — Other Ambulatory Visit: Payer: Self-pay | Admitting: Family Medicine

## 2022-08-19 ENCOUNTER — Encounter: Payer: Self-pay | Admitting: Allergy

## 2022-08-19 ENCOUNTER — Ambulatory Visit (INDEPENDENT_AMBULATORY_CARE_PROVIDER_SITE_OTHER): Payer: Medicare Other | Admitting: Allergy

## 2022-08-19 ENCOUNTER — Telehealth: Payer: Self-pay | Admitting: *Deleted

## 2022-08-19 DIAGNOSIS — Z7952 Long term (current) use of systemic steroids: Secondary | ICD-10-CM | POA: Diagnosis not present

## 2022-08-19 DIAGNOSIS — M301 Polyarteritis with lung involvement [Churg-Strauss]: Secondary | ICD-10-CM | POA: Diagnosis not present

## 2022-08-19 DIAGNOSIS — D7218 Eosinophilia in diseases classified elsewhere: Secondary | ICD-10-CM | POA: Diagnosis not present

## 2022-08-19 DIAGNOSIS — J3089 Other allergic rhinitis: Secondary | ICD-10-CM | POA: Diagnosis not present

## 2022-08-19 NOTE — Patient Instructions (Addendum)
EGPA Continue prednisone '20mg'$  daily for now. Continue Nucala '300mg'$  injections every 4 weeks. I don't know of any new injections that are getting approved for EGPA at this time but this may change in the future as there are many biologics that are currently used for other eosinophilic conditions.  Continue to follow up with your pulmonologist, cardiologist, pain management, PCP.   Recommend rheumatology referral next at [facility] to start. Ask which doctor sees EGPA or vasculitic conditions.  [phone]  EGPA centers [email or web address] Closest one is [facility].  Environmental allergies Start environmental control measures as below for cats.  Use over the counter antihistamines such as Zyrtec (cetirizine), Claritin (loratadine), Allegra (fexofenadine), or Xyzal (levocetirizine) daily as needed. May switch antihistamines every few months.  Follow up in 1 year.  Pet Allergen Avoidance: Contrary to popular opinion, there are no "hypoallergenic" breeds of dogs or cats. That is because people are not allergic to an animal's hair, but to an allergen found in the animal's saliva, dander (dead skin flakes) or urine. Pet allergy symptoms typically occur within minutes. For some people, symptoms can build up and become most severe 8 to 12 hours after contact with the animal. People with severe allergies can experience reactions in public places if dander has been transported on the pet owners' clothing. Keeping an animal outdoors is only a partial solution, since homes with pets in the yard still have higher concentrations of animal allergens. Before getting a pet, ask your allergist to determine if you are allergic to animals. If your pet is already considered part of your family, try to minimize contact and keep the pet out of the bedroom and other rooms where you spend a great deal of time. As with dust mites, vacuum carpets often or replace carpet with a hardwood floor,  tile or linoleum. High-efficiency particulate air (HEPA) cleaners can reduce allergen levels over time. While dander and saliva are the source of cat and dog allergens, urine is the source of allergens from rabbits, hamsters, mice and [place] pigs; so ask a non-allergic family member to clean the animal's cage. If you have a pet allergy, talk to your allergist about the potential for allergy immunotherapy (allergy shots). This strategy can often provide long-term relief.

## 2022-08-19 NOTE — Telephone Encounter (Signed)
Please request Dr. Graylin Shiver or Dr. Baxter Flattery.  Thank you!
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
## 2022-08-19 NOTE — Telephone Encounter (Signed)
Per Dr. Maudie Mercury please place a referral with [facility] for EGPA.

## 2022-08-19 NOTE — Assessment & Plan Note (Signed)
Mainly around her cats and controlled with zyrtec.  Start environmental control measures as below for cats.   Use over the counter antihistamines such as Zyrtec (cetirizine), Claritin (loratadine), Allegra (fexofenadine), or Xyzal (levocetirizine) daily as needed. May switch antihistamines every few months.

## 2022-08-19 NOTE — Assessment & Plan Note (Addendum)
Diagnosed in 2018 and had good response with prednisone 41m daily and Nucala 3066mQ4 weeks injections. Noted flare in her respiratory symptoms when missed 2 months of Nucala. Rash and joint pains/aches flared when tried to wean prednisone to 1567mFollows with pulmonology, cardiology, pain medicine, ophthalmology. Used to see rheumatology and dermatology in the past. Medical history significant for PAF s/p ablation and cardioversion, CHF, HTN, HLP, hypothyroidism, fibromyalgia, nocturnal hypoxemia on oxygen, arthritis, chronic pain, CKD, ex-smoker. Main concerns are chronic prednisone use and looking for alternative treatment options.    Patient is hesitant about trying to wean off prednisone again due to her rebound symptoms and understands the risks associated with chronic systemic steroid use but she would rather have "quality of life than quantity".  Discussed with patient and caretaker at length that I'm not aware of any new biologics in the EGPA area that are going to be approved soon. There are have been some studies with benralizumab which met endpoint criteria but benralizumab and mepolizumab have similar mechanism of actions that I don't think the switch would be beneficial.   Studies with omalizumab were small in the past and no recent studies published. Her IgE was <1.5.   Recommend to avoid dupilumab as in some cases it unmasked/exacerbated EGPA.  No published studies with tezepelumab and EGPA.   I was unable to view any notes from rheumatology but it does not seem like she was trialed on any other agents such as cyclophosphamide, rituximab, azathioprine, methotrexate or mycophenolate. She may not be a candidate for some of these treatments due to her comorbidities. I typically do not prescribe these medications and would like rheumatology input regarding them.   [facility] Continue prednisone 9m68mily for now. . Continue Nucala 300mg67mections  every 4 weeks. . Continue to follow up with pulmonologist, cardiologist, pain management, PCP.

## 2022-08-25 ENCOUNTER — Encounter: Payer: Self-pay | Admitting: Cardiology

## 2022-08-25 ENCOUNTER — Ambulatory Visit: Payer: Medicare Other | Attending: Cardiology | Admitting: Cardiology

## 2022-08-25 VITALS — BP 146/92 | HR 67 | Ht 66.0 in | Wt 242.0 lb

## 2022-08-25 DIAGNOSIS — D6869 Other thrombophilia: Secondary | ICD-10-CM

## 2022-08-25 DIAGNOSIS — I2510 Atherosclerotic heart disease of native coronary artery without angina pectoris: Secondary | ICD-10-CM | POA: Diagnosis not present

## 2022-08-25 DIAGNOSIS — I4819 Other persistent atrial fibrillation: Secondary | ICD-10-CM

## 2022-08-25 NOTE — Patient Instructions (Signed)
Medication Instructions:  Your physician recommends that you continue on your current medications as directed. Please refer to the Current Medication list given to you today.  *If you need a refill on your cardiac medications before your next appointment, please call your pharmacy*   Lab Work: None ordered  Testing/Procedures: None ordered   Follow-Up: At [facility], you and your health needs are our priority.  As part of our continuing mission to provide you with exceptional heart care, we have created designated Provider Care Teams.  These Care Teams include your primary Cardiologist (physician) and Advanced Practice Providers (APPs -  Physician Assistants and Nurse Practitioners) who all work together to provide you with the care you need, when you need it.   Your next appointment:   6 month(s)  The format for your next appointment:   In Person  Provider:   You will see one of the following Advanced Practice Providers on your designated Care Team:   Renee Ursuy, PA-C Michael "Andy" Tillery, PA-C    Thank you for choosing [facility]!!   Bransen Fassnacht, RN [phone]  Other Instructions   Important Information About Sugar           
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
## 2022-08-26 ENCOUNTER — Telehealth: Payer: Self-pay | Admitting: Neurology

## 2022-08-26 DIAGNOSIS — G894 Chronic pain syndrome: Secondary | ICD-10-CM | POA: Diagnosis not present

## 2022-08-26 DIAGNOSIS — M47817 Spondylosis without myelopathy or radiculopathy, lumbosacral region: Secondary | ICD-10-CM | POA: Diagnosis not present

## 2022-08-26 DIAGNOSIS — M170 Bilateral primary osteoarthritis of knee: Secondary | ICD-10-CM | POA: Diagnosis not present

## 2022-08-26 DIAGNOSIS — M47812 Spondylosis without myelopathy or radiculopathy, cervical region: Secondary | ICD-10-CM | POA: Diagnosis not present

## 2022-08-26 NOTE — Telephone Encounter (Signed)
Patient left vm stating she has spoke with all her providers and they agree she can continue on Prednisone 20 mg daily. She states Pulmonary tried to wean her back to 10 mg and she ran out early. She wants RX refill sent to [facility].   I called patient and LVM letting her know this RX was managed by pulmonology and she needs to call them to request refills.   Dr. [name] - FYI.

## 2022-08-30 ENCOUNTER — Other Ambulatory Visit: Payer: Self-pay | Admitting: Family Medicine

## 2022-08-30 DIAGNOSIS — Z79890 Hormone replacement therapy (postmenopausal): Secondary | ICD-10-CM

## 2022-09-02 DIAGNOSIS — H524 Presbyopia: Secondary | ICD-10-CM | POA: Diagnosis not present

## 2022-09-02 DIAGNOSIS — Z961 Presence of intraocular lens: Secondary | ICD-10-CM | POA: Diagnosis not present

## 2022-09-02 NOTE — Telephone Encounter (Signed)
Referral has been placed to review. Their office doesn't allow you to schedule via phone. I am sending the patient a updated MyChart message and giving her a call.

## 2022-09-15 DEATH — deceased

## 2022-09-21 ENCOUNTER — Ambulatory Visit: Payer: Medicare Other | Admitting: Cardiovascular Disease

## 2022-10-26 ENCOUNTER — Ambulatory Visit: Payer: Medicare Other | Admitting: Family Medicine

## 2022-12-01 ENCOUNTER — Other Ambulatory Visit: Payer: Medicare Other

## 2023-07-20 IMAGING — DX DG ANKLE COMPLETE 3+V*R*
3 series · 3 of 3 positions shown · non-contrast
Comparison: 06/03/2021; 05/25/2021

CLINICAL DATA: Follow-up right ankle fracture.

EXAM:
RIGHT ANKLE - COMPLETE 3+ VIEW

[ankle ap]
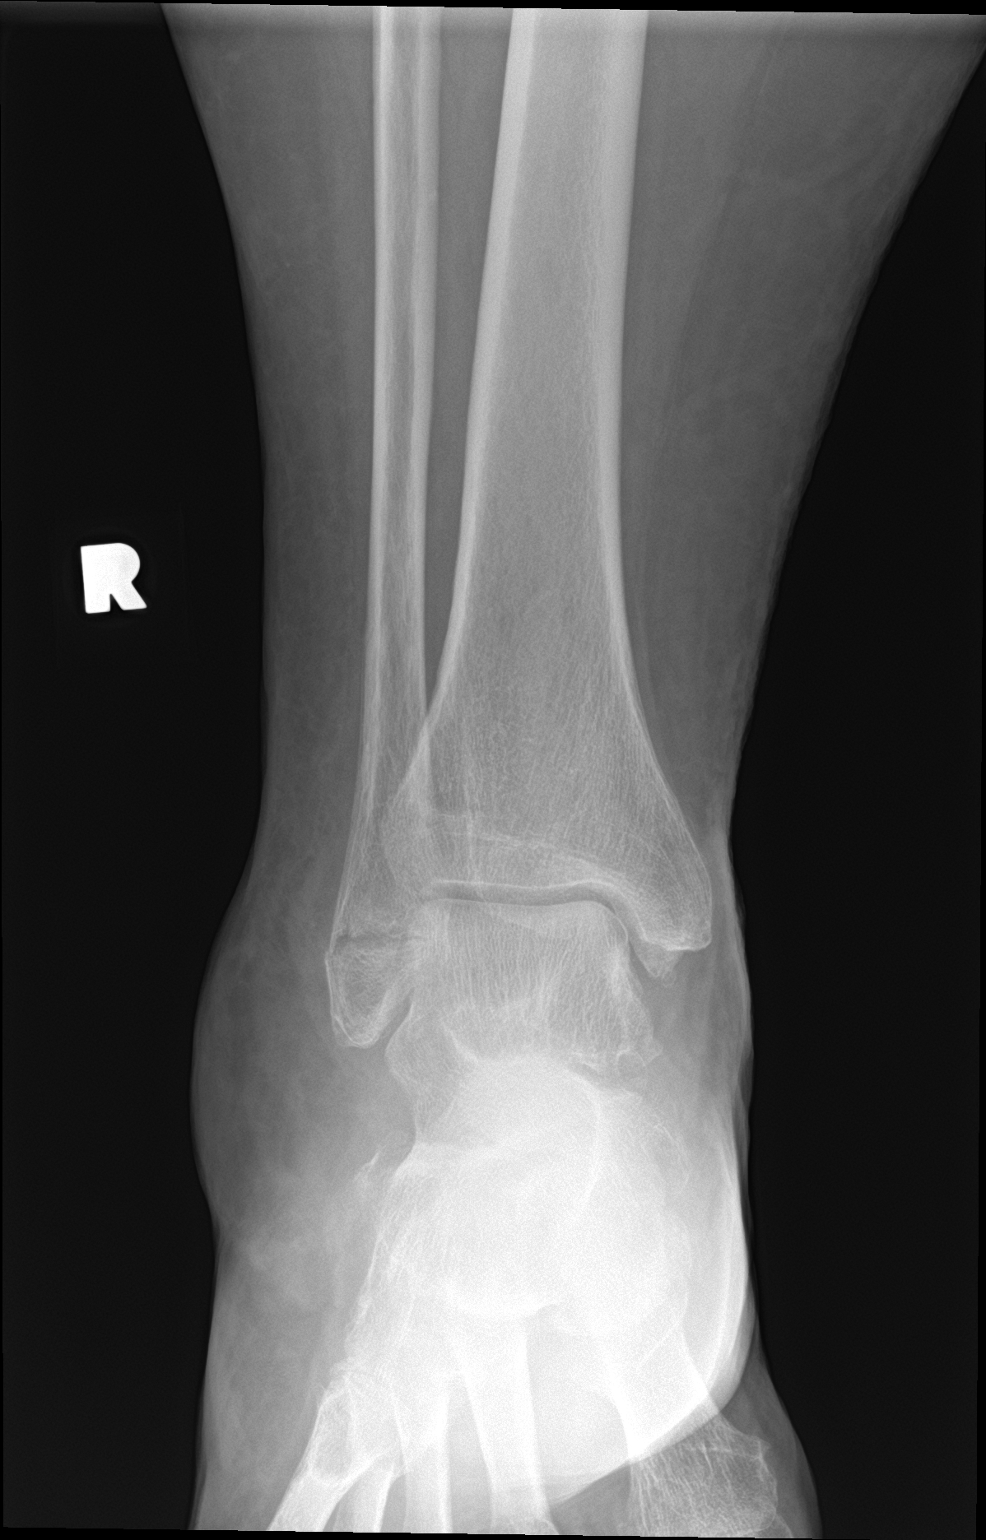

[ankle obl]
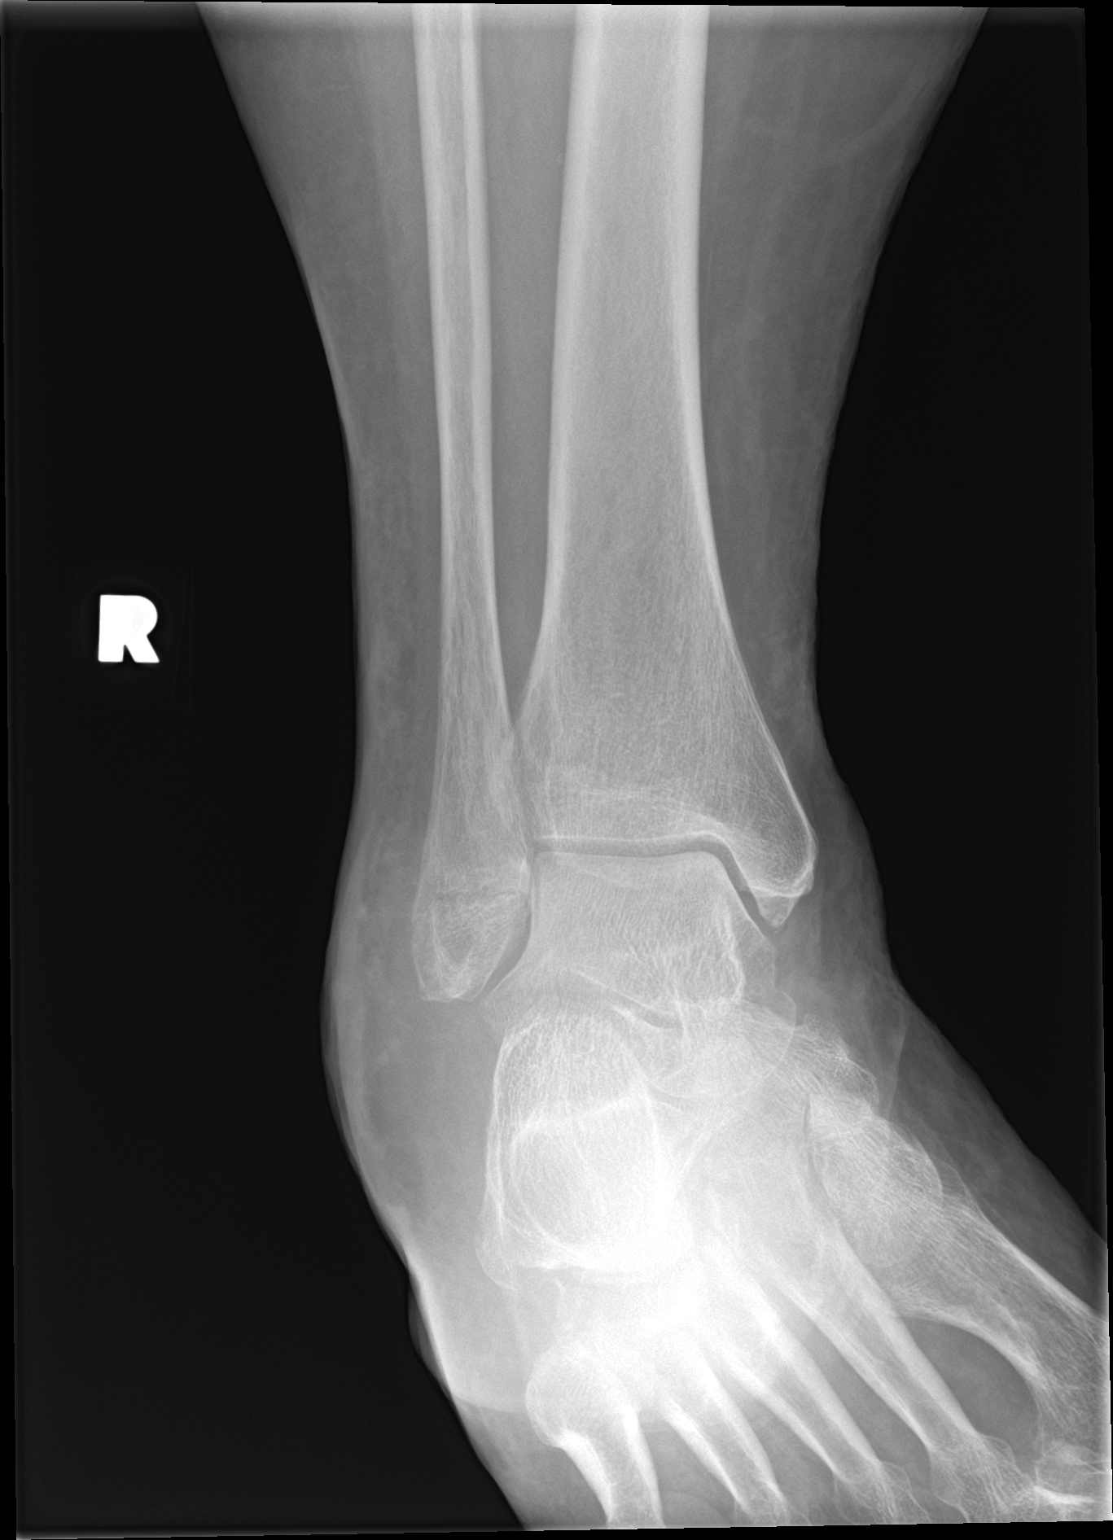

[ankle lat]
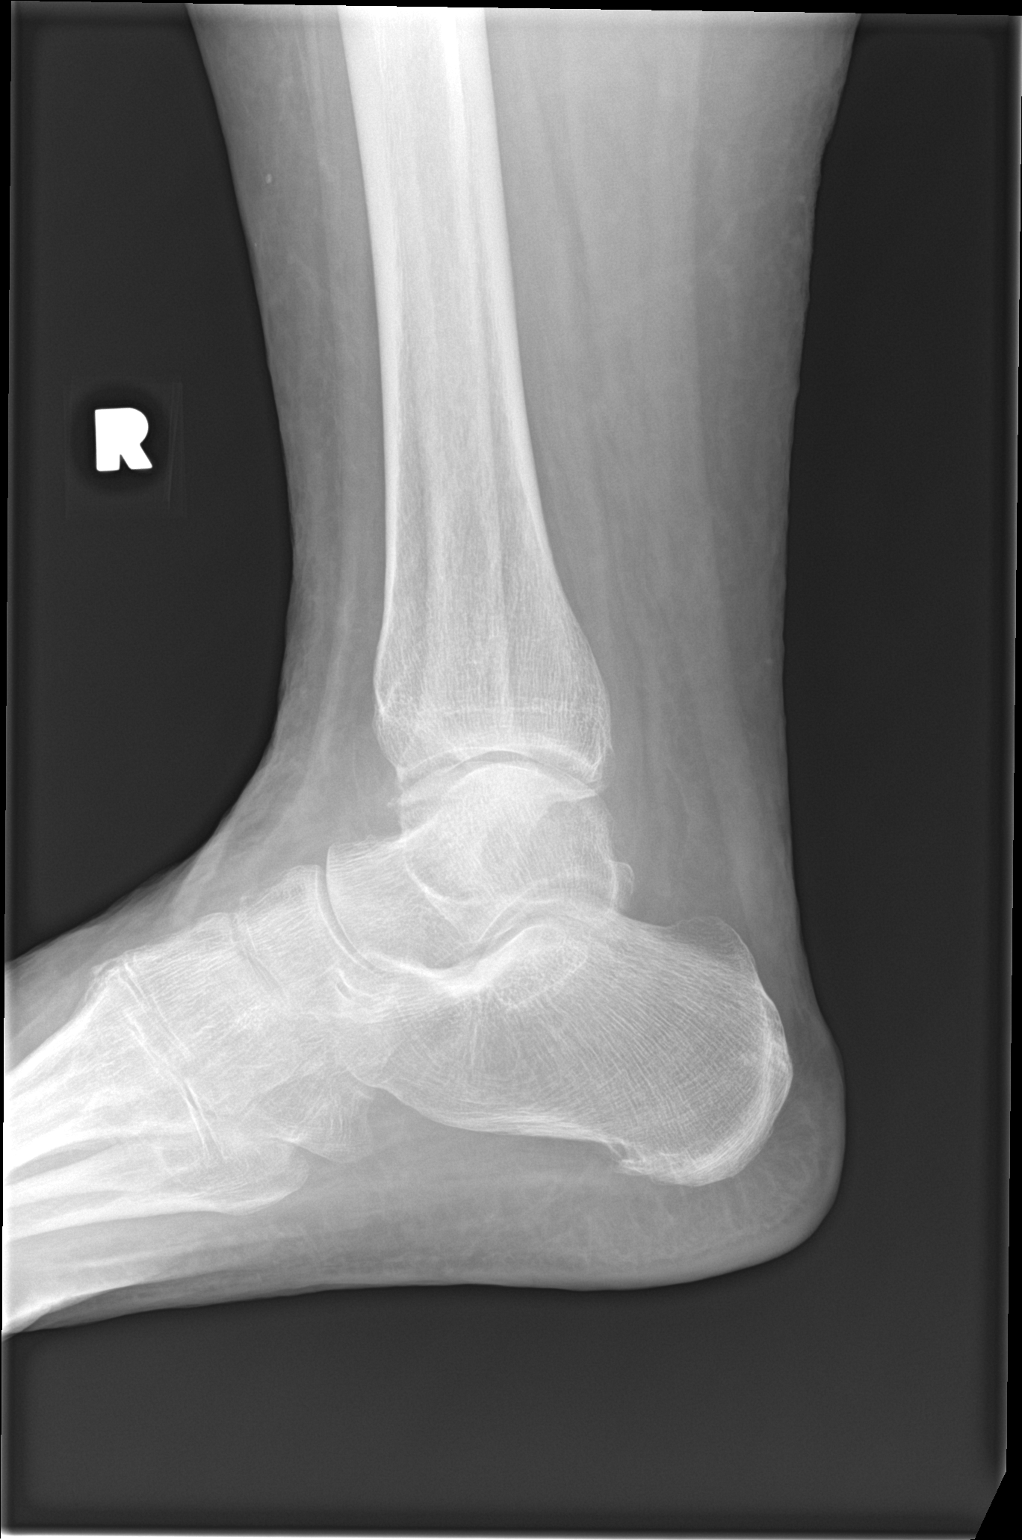

[3 of 3 positions shown; findings below may reference images not displayed]

FINDINGS: Redemonstrated horizontally oriented predominantly nondisplaced
fracture of the distal fibula with no change in alignment. No
significant callus formation. Grossly unchanged extensive adjacent
soft tissue swelling.

No new fractures are identified. Joint spaces are preserved. Ankle
mortise is preserved. Tiny plantar calcaneal spur.
IMPRESSION: No change in alignment or significant callus formation of known
nondisplaced horizontal fracture of the distal fibula.
